# Patient Record
Sex: Female | Born: 1940
Health system: Southern US, Community
[De-identification: ages and names within clinical notes are randomized; demographics above are authoritative.]

## PROBLEM LIST (undated history)

## (undated) DIAGNOSIS — K5790 Diverticulosis of intestine, part unspecified, without perforation or abscess without bleeding: Secondary | ICD-10-CM

## (undated) DIAGNOSIS — C801 Malignant (primary) neoplasm, unspecified: Secondary | ICD-10-CM

## (undated) DIAGNOSIS — Z807 Family history of other malignant neoplasms of lymphoid, hematopoietic and related tissues: Secondary | ICD-10-CM

## (undated) DIAGNOSIS — Z801 Family history of malignant neoplasm of trachea, bronchus and lung: Secondary | ICD-10-CM

## (undated) DIAGNOSIS — K219 Gastro-esophageal reflux disease without esophagitis: Secondary | ICD-10-CM

## (undated) DIAGNOSIS — T8859XA Other complications of anesthesia, initial encounter: Secondary | ICD-10-CM

## (undated) DIAGNOSIS — T4145XA Adverse effect of unspecified anesthetic, initial encounter: Secondary | ICD-10-CM

## (undated) HISTORY — PX: TONSILLECTOMY: SUR1361

## (undated) HISTORY — DX: Family history of malignant neoplasm of trachea, bronchus and lung: Z80.1

## (undated) HISTORY — DX: Family history of other malignant neoplasms of lymphoid, hematopoietic and related tissues: Z80.7

## (undated) HISTORY — PX: PARTIAL HYSTERECTOMY: SHX80

## (undated) HISTORY — DX: Diverticulosis of intestine, part unspecified, without perforation or abscess without bleeding: K57.90

## (undated) HISTORY — PX: COLONOSCOPY: SHX174

---

## 1998-04-26 HISTORY — PX: OTHER SURGICAL HISTORY: SHX169

## 1999-04-27 HISTORY — PX: OTHER SURGICAL HISTORY: SHX169

## 2010-12-01 ENCOUNTER — Ambulatory Visit (HOSPITAL_BASED_OUTPATIENT_CLINIC_OR_DEPARTMENT_OTHER)
Admission: RE | Admit: 2010-12-01 | Discharge: 2010-12-01 | Disposition: A | Discharge status: Home / Self Care | Payer: Medicare Other | Source: Ambulatory Visit | Attending: Urology | Admitting: Urology

## 2010-12-01 DIAGNOSIS — N949 Unspecified condition associated with female genital organs and menstrual cycle: Secondary | ICD-10-CM | POA: Insufficient documentation

## 2010-12-01 DIAGNOSIS — Z79899 Other long term (current) drug therapy: Secondary | ICD-10-CM | POA: Insufficient documentation

## 2010-12-01 DIAGNOSIS — N3010 Interstitial cystitis (chronic) without hematuria: Secondary | ICD-10-CM | POA: Insufficient documentation

## 2010-12-01 DIAGNOSIS — Z01812 Encounter for preprocedural laboratory examination: Secondary | ICD-10-CM | POA: Insufficient documentation

## 2010-12-01 DIAGNOSIS — Z01810 Encounter for preprocedural cardiovascular examination: Secondary | ICD-10-CM | POA: Insufficient documentation

## 2010-12-01 LAB — POCT HEMOGLOBIN-HEMACUE: Hemoglobin: 14.7 g/dL (ref 12.0–15.0)

## 2010-12-18 NOTE — Op Note (Signed)
  Misty Hopkins, DWIGHT               ACCOUNT NO.:  [id number]  MEDICAL RECORD NO.:  [id number]  LOCATION:                                 FACILITY:  PHYSICIAN:  Martina Sinner, MD DATE OF BIRTH:  07/30/1940  DATE OF PROCEDURE:  12/01/2010 DATE OF DISCHARGE:                              OPERATIVE REPORT   PREOPERATIVE DIAGNOSIS:  Interstitial cystitis.  POSTOPERATIVE DIAGNOSIS:  Interstitial cystitis.  SURGERY:  Cystoscopy, bladder hydrodistention, bladder instillation therapy.  The patient has the above diagnosis.  She responds favorably based upon her past history to hydrodistentions.  She was prepped and draped in usual fashion.  A 21 [place] scope was utilized.  Bladder mucosa and trigone were normal.  There was no stricture, foreign body, or carcinoma.  She was hydrodistended to 500 mL for 5 minutes.  Her bladder was emptied.  When I re-inspected her bladder, she had mild glomerulation throughout her bladder.  There were more prominent 5 and 7 o'clock.  There was no bladder injury.  Bladder was emptied.  I instilled 50 cc of DMSO since she says that works better in her with a red rubber catheter.  This was done as a separate procedure.  The patient will be followed for interstitial cystitis.  Preoperative ciprofloxacin was given.          ______________________________ Martina Sinner, MD     SAM/MEDQ  D:  12/01/2010  T:  12/02/2010  Job:  [id number]  Electronically Signed by Alfredo Martinez MD on 12/18/2010 08:19:33 AM

## 2012-08-14 ENCOUNTER — Ambulatory Visit: Payer: Medicare Other | Attending: Obstetrics and Gynecology | Admitting: Physical Therapy

## 2012-08-14 DIAGNOSIS — M629 Disorder of muscle, unspecified: Secondary | ICD-10-CM | POA: Insufficient documentation

## 2012-08-14 DIAGNOSIS — M2420 Disorder of ligament, unspecified site: Secondary | ICD-10-CM | POA: Insufficient documentation

## 2012-08-14 DIAGNOSIS — M62838 Other muscle spasm: Secondary | ICD-10-CM | POA: Insufficient documentation

## 2012-08-14 DIAGNOSIS — IMO0001 Reserved for inherently not codable concepts without codable children: Secondary | ICD-10-CM | POA: Insufficient documentation

## 2012-08-28 ENCOUNTER — Ambulatory Visit: Payer: Medicare Other | Admitting: Physical Therapy

## 2012-09-04 ENCOUNTER — Ambulatory Visit: Payer: Medicare Other | Attending: Obstetrics and Gynecology | Admitting: Physical Therapy

## 2012-09-04 DIAGNOSIS — IMO0001 Reserved for inherently not codable concepts without codable children: Secondary | ICD-10-CM | POA: Insufficient documentation

## 2012-09-04 DIAGNOSIS — M2420 Disorder of ligament, unspecified site: Secondary | ICD-10-CM | POA: Insufficient documentation

## 2012-09-04 DIAGNOSIS — M629 Disorder of muscle, unspecified: Secondary | ICD-10-CM | POA: Insufficient documentation

## 2012-09-04 DIAGNOSIS — M62838 Other muscle spasm: Secondary | ICD-10-CM | POA: Insufficient documentation

## 2012-09-20 ENCOUNTER — Ambulatory Visit: Payer: Medicare Other | Admitting: Physical Therapy

## 2012-09-27 ENCOUNTER — Ambulatory Visit: Payer: Medicare Other | Attending: Obstetrics and Gynecology | Admitting: Physical Therapy

## 2012-09-27 DIAGNOSIS — M2420 Disorder of ligament, unspecified site: Secondary | ICD-10-CM | POA: Insufficient documentation

## 2012-09-27 DIAGNOSIS — IMO0002 Reserved for concepts with insufficient information to code with codable children: Secondary | ICD-10-CM | POA: Insufficient documentation

## 2012-09-27 DIAGNOSIS — M629 Disorder of muscle, unspecified: Secondary | ICD-10-CM | POA: Insufficient documentation

## 2012-09-27 DIAGNOSIS — M62838 Other muscle spasm: Secondary | ICD-10-CM | POA: Insufficient documentation

## 2012-09-27 DIAGNOSIS — IMO0001 Reserved for inherently not codable concepts without codable children: Secondary | ICD-10-CM | POA: Insufficient documentation

## 2012-10-09 ENCOUNTER — Ambulatory Visit: Payer: Medicare Other | Admitting: Physical Therapy

## 2015-04-27 HISTORY — PX: EYE SURGERY: SHX253

## 2018-02-24 ENCOUNTER — Telehealth: Payer: Self-pay | Admitting: *Deleted

## 2018-02-24 NOTE — Telephone Encounter (Signed)
Called and scheduled the patient for a new patient to see Dr. Denman George on 11/5

## 2018-02-28 ENCOUNTER — Telehealth: Payer: Self-pay | Admitting: Oncology

## 2018-02-28 ENCOUNTER — Inpatient Hospital Stay (HOSPITAL_BASED_OUTPATIENT_CLINIC_OR_DEPARTMENT_OTHER): Payer: Medicare HMO | Admitting: Gynecologic Oncology

## 2018-02-28 ENCOUNTER — Inpatient Hospital Stay: Payer: Medicare HMO | Attending: Gynecologic Oncology

## 2018-02-28 ENCOUNTER — Encounter: Payer: Self-pay | Admitting: Gynecologic Oncology

## 2018-02-28 VITALS — BP 172/92 | HR 70 | Temp 98.6°F | Resp 20 | Ht 64.0 in | Wt 149.6 lb

## 2018-02-28 DIAGNOSIS — K668 Other specified disorders of peritoneum: Secondary | ICD-10-CM

## 2018-02-28 DIAGNOSIS — C801 Malignant (primary) neoplasm, unspecified: Secondary | ICD-10-CM | POA: Diagnosis not present

## 2018-02-28 DIAGNOSIS — C800 Disseminated malignant neoplasm, unspecified: Secondary | ICD-10-CM

## 2018-02-28 DIAGNOSIS — Z01818 Encounter for other preprocedural examination: Secondary | ICD-10-CM | POA: Diagnosis not present

## 2018-02-28 DIAGNOSIS — R971 Elevated cancer antigen 125 [CA 125]: Secondary | ICD-10-CM | POA: Insufficient documentation

## 2018-02-28 DIAGNOSIS — C786 Secondary malignant neoplasm of retroperitoneum and peritoneum: Secondary | ICD-10-CM | POA: Diagnosis not present

## 2018-02-28 DIAGNOSIS — L03316 Cellulitis of umbilicus: Secondary | ICD-10-CM | POA: Insufficient documentation

## 2018-02-28 LAB — CBC WITH DIFFERENTIAL (CANCER CENTER ONLY)
ABS IMMATURE GRANULOCYTES: 0.03 10*3/uL (ref 0.00–0.07)
Basophils Absolute: 0.1 10*3/uL (ref 0.0–0.1)
Basophils Relative: 1 %
EOS PCT: 2 %
Eosinophils Absolute: 0.2 10*3/uL (ref 0.0–0.5)
HEMATOCRIT: 41.4 % (ref 36.0–46.0)
Hemoglobin: 13.1 g/dL (ref 12.0–15.0)
Immature Granulocytes: 0 %
LYMPHS ABS: 2.7 10*3/uL (ref 0.7–4.0)
LYMPHS PCT: 30 %
MCH: 28.1 pg (ref 26.0–34.0)
MCHC: 31.6 g/dL (ref 30.0–36.0)
MCV: 88.8 fL (ref 80.0–100.0)
MONO ABS: 0.9 10*3/uL (ref 0.1–1.0)
MONOS PCT: 10 %
NEUTROS ABS: 5.3 10*3/uL (ref 1.7–7.7)
Neutrophils Relative %: 57 %
Platelet Count: 208 10*3/uL (ref 150–400)
RBC: 4.66 MIL/uL (ref 3.87–5.11)
RDW: 14.1 % (ref 11.5–15.5)
WBC Count: 9.1 10*3/uL (ref 4.0–10.5)
nRBC: 0 % (ref 0.0–0.2)

## 2018-02-28 LAB — BASIC METABOLIC PANEL
Anion gap: 7 (ref 5–15)
BUN: 13 mg/dL (ref 8–23)
CO2: 25 mmol/L (ref 22–32)
CREATININE: 0.74 mg/dL (ref 0.44–1.00)
Calcium: 9.2 mg/dL (ref 8.9–10.3)
Chloride: 109 mmol/L (ref 98–111)
GFR calc Af Amer: >60 mL/min (ref >60)
Glucose, Bld: 98 mg/dL (ref 70–99)
POTASSIUM: 4 mmol/L (ref 3.5–5.1)
Sodium: 141 mmol/L (ref 135–145)

## 2018-02-28 NOTE — Patient Instructions (Signed)
Preparing for your Surgery  Plan for surgery on March 06, 2018 with Dr. Everitt Amber at [facility] will be scheduled for a diagnostic laparoscopy with omental biopsy.   Pre-operative Testing -You will receive a phone call from presurgical testing at [facility] to arrange for a pre-operative testing appointment before your surgery.  This appointment normally occurs one to two weeks before your scheduled surgery.   -Bring your insurance card, copy of an advanced directive if applicable, medication list  -At that visit, you will be asked to sign a consent for a possible blood transfusion in case a transfusion becomes necessary during surgery.  The need for a blood transfusion is rare but having consent is a necessary part of your care.     -You should not be taking blood thinners or aspirin at least ten days prior to surgery unless instructed by your surgeon.  Day Before Surgery at [place] will be asked to take in a light diet the day before surgery.  Avoid carbonated beverages.  You will be advised to have nothing to eat or drink after midnight the evening before.    Eat a light diet the day before surgery.  Examples including soups, broths, toast, yogurt, mashed potatoes.  Things to avoid include carbonated beverages (fizzy beverages), raw fruits and raw vegetables, or beans.   If your bowels are filled with gas, your surgeon will have difficulty visualizing your pelvic organs which increases your surgical risks.  Your role in recovery Your role is to become active as soon as directed by your doctor, while still giving yourself time to heal.  Rest when you feel tired. You will be asked to do the following in order to speed your recovery:  - Cough and breathe deeply. This helps toclear and expand your lungs and can prevent pneumonia. You may be given a spirometer to practice deep breathing. A staff member will show you how to use the spirometer. - Do  mild physical activity. Walking or moving your legs help your circulation and body functions return to normal. A staff member will help you when you try to walk and will provide you with simple exercises. Do not try to get up or walk alone the first time. - Actively manage your pain. Managing your pain lets you move in comfort. We will ask you to rate your pain on a scale of zero to 10. It is your responsibility to tell your doctor or nurse where and how much you hurt so your pain can be treated.  Special Considerations  -FMLA forms can be faxed to [phone] and please allow 5-7 business days for completion.   Blood Transfusion Information WHAT IS A BLOOD TRANSFUSION? A transfusion is the replacement of blood or some of its parts. Blood is made up of multiple cells which provide different functions.  Red blood cells carry oxygen and are used for blood loss replacement.  White blood cells fight against infection.  Platelets control bleeding.  Plasma helps clot blood.  Other blood products are available for specialized needs, such as hemophilia or other clotting disorders. BEFORE THE TRANSFUSION  Who gives blood for transfusions?   You may be able to donate blood to be used at a later date on yourself (autologous donation).  Relatives can be asked to donate blood. This is generally not any safer than if you have received blood from a stranger. The same precautions are taken to ensure safety when a relative's blood is  donated.  Healthy volunteers who are fully evaluated to make sure their blood is safe. This is blood bank blood. Transfusion therapy is the safest it has ever been in the practice of medicine. Before blood is taken from a donor, a complete history is taken to make sure that person has no history of diseases nor engages in risky social behavior (examples are intravenous drug use or sexual activity with multiple partners). The donor's travel history is screened to minimize  risk of transmitting infections, such as malaria. The donated blood is tested for signs of infectious diseases, such as HIV and hepatitis. The blood is then tested to be sure it is compatible with you in order to minimize the chance of a transfusion reaction. If you or a relative donates blood, this is often done in anticipation of surgery and is not appropriate for emergency situations. It takes many days to process the donated blood. RISKS AND COMPLICATIONS Although transfusion therapy is very safe and saves many lives, the main dangers of transfusion include:   Getting an infectious disease.  Developing a transfusion reaction. This is an allergic reaction to something in the blood you were given. Every precaution is taken to prevent this. The decision to have a blood transfusion has been considered carefully by your caregiver before blood is given. Blood is not given unless the benefits outweigh the risks.

## 2018-02-28 NOTE — Progress Notes (Signed)
Consult Note: Gyn-Onc  Consult was requested by Dr. Helane Rima for the evaluation of Misty Hopkins 77 y.o. female  CC:  Chief Complaint  Patient presents with  . omental mass    Assessment/Plan:  Ms. Misty Hopkins  is a 77 y.o.  year old with omental nodularity on CT scan.  I reviewed the CT scan report with the patient and her mildly elevated Ca1 25 of 103.  I discussed that the likely etiologies for the CT scan findings were either inflammation or metastatic carcinoma.  I am awaiting receiving the images to review myself to help determine the likelihood of one versus the other.  What is reassuring as she does not have adnexal masses or ascites, however potentially an early stage primary peritoneal malignancy may present in this way.  Alternatively a non-GYN malignancy may be metastatic to the omentum.  Either way I feel that a diagnostic procedure is necessary with either a diagnostic laparoscopy and biopsy of the omentum or an IR guided percutaneous biopsy.  I have scheduled her preemptively for the laparoscopic approach to take place on November 11 if however assessment of the imaging reveals that a percutaneous approach might be better for her we will change our plan.  I discussed that based on the surgical pathology findings from the biopsy we will make a determination if she needs a subsequent more radical surgery for debulking or alternatively proceeding with chemotherapy as specified by the cell type on the biopsy.  If no malignancy is found in this condition does not need to be managed with either.  I prepared her for the diagnostic laparoscopy by describing the procedure, its anticipated recovery, and surgical risks including bleeding, infection, damage to adjacent organs.  I discussed that pathology takes approximately 72 hours to be resulted and following that we will know better about her diagnosis and subsequent plan.  We will redraw tumor markers today including CEA.  We will obtain  outpatient images to review.   HPI: Ms. Misty Hopkins is a 77 year old P1 who was seen in consultation at the request of Dr. Helane Rima for omental nodularity and apparent carcinomatosis.  The patient reports a history of vague pelvic aching since July 2019.  This seemed worse with passage of gas through the colon and having a bowel movement.  She also noticed a change in her bowel habit with narrowing of stools and pain with having bowel movements.  She describes the pain is rolling gas pains.  She had a colonoscopy 2 years prior to the onset of the pain which revealed diverticulosis.  When she began experiencing these symptoms primary care physician presumptively and empirically prescribed ciprofloxacin for presumed diverticulitis.  This resulted in slight easing up with the pain but it never eliminated.  She then proceeded to have worsening pain in October 2019 and was seen in an urgent care where she was prescribed Flagyl for presumed diverticulitis.  Because of the persistence of the pain a CT scan of the abdomen and pelvis was ordered on February 20, 2018 and this was performed at [facility] [place].  At the time of this initial evaluation only the CT scan report was available.  This revealed an incidental finding of a 1.5 cm subcapsular lesion in the lateral upper pole of the left kidney which measured higher than fluid attenuation which could represent a proteinaceous renal cyst or solid renal mass.  There was no ascites.  There is no lymphadenopathy.  There were no masses in the pelvis including  ovarian or adnexal masses seen.  There was however soft tissue stranding seen with nodularity in the omental fat in the lower abdomen without evidence of ascites.  This was felt to represent either carcinomatosis or inflammatory infectious etiologies.  There was colonic diverticulosis seen without radiographic evidence of diverticulitis.  The radiologist recommended MRI of the abdomen to further work-up the renal mass.     In follow-up of the CT scan she was seen by Dr.Grewal ordered a Ca1 25 on 02/22/2018 which was elevated at 103.  She also performed a transvaginal ultrasound that same day which revealed a surgically absent uterus, and neither ovary was visualized due to diffuse bowel shadowing, but no adnexal masses or free fluid was seen.  The patient is otherwise fairly healthy 77 year old woman.  She has a history of one prior vaginal delivery complicated by pelvic organ relaxation.  She subsequently underwent a vaginal hysterectomy and then subsequently vaginal and abdominal Burch procedure and rectal prolapse surgery to repair pelvic organ prolapse.  Her past medical history is mostly significant for interstitial cystitis and diverticulitis.  Her past surgical history is significant for vaginal surgery as stated above, her only abdominal surgery was an abdominal Burch procedure.  She lives with her husband who is of good health.  Her family history significant only for lung cancer and a father who was a smoker.  Current Meds:  Outpatient Encounter Medications as of 02/28/2018  Medication Sig  . Biotin 10 MG CAPS Take 10 mg by mouth daily.  Marland Kitchen CALCIUM CARBONATE-VITAMIN D PO Take 1 tablet by mouth daily.  Marland Kitchen OVER THE COUNTER MEDICATION Take 1 tablet by mouth as needed.  . traZODone (DESYREL) 50 MG tablet Take 50 mg by mouth at bedtime as needed.    No facility-administered encounter medications on file as of 02/28/2018.     Allergy:  Allergies  Allergen Reactions  . Ivp Dye [Iodinated Diagnostic Agents] Anaphylaxis    Anaphylaxis and hives  . Propoxyphene Anaphylaxis    Hives, SOB , swollen all over  . Codeine Nausea Only    Headache and Nausea  . Ultram [Tramadol Hcl] Hives    Hives     Social Hx:   Social History   Socioeconomic History  . Marital status: Married    Spouse name: Not on file  . Number of children: Not on file  . Years of education: Not on file  . Highest education level: Not  on file  Occupational History  . Not on file  Social Needs  . Financial resource strain: Not on file  . Food insecurity:    Worry: Not on file    Inability: Not on file  . Transportation needs:    Medical: Not on file    Non-medical: Not on file  Tobacco Use  . Smoking status: Never Smoker  . Smokeless tobacco: Never Used  Substance and Sexual Activity  . Alcohol use: Never    Frequency: Never  . Drug use: Never  . Sexual activity: Not on file  Lifestyle  . Physical activity:    Days per week: Not on file    Minutes per session: Not on file  . Stress: Not on file  Relationships  . Social connections:    Talks on phone: Not on file    Gets together: Not on file    Attends religious service: Not on file    Active member of club or organization: Not on file    Attends meetings of  clubs or organizations: Not on file    Relationship status: Not on file  . Intimate partner violence:    Fear of current or ex partner: Not on file    Emotionally abused: Not on file    Physically abused: Not on file    Forced sexual activity: Not on file  Other Topics Concern  . Not on file  Social History Narrative  . Not on file    Past Surgical Hx:  Past Surgical History:  Procedure Laterality Date  . Bladder Lift  2000  . PARTIAL HYSTERECTOMY     40 years ago  . Rectum Lift  2001    Past Medical Hx: History reviewed. No pertinent past medical history.  Past Gynecological History:  No prior abnormal paps, hysterectomy age 34. Last pap normal 2019 (vaginal). No LMP recorded.  Family Hx:  Family History  Problem Relation Age of Onset  . Diabetes Mother   . Stroke Mother   . Non-Hodgkin's lymphoma Brother     Review of Systems:  Constitutional  Feels well,  ENT Normal appearing ears and nares bilaterally Skin/Breast  No rash, sores, jaundice, itching, dryness Cardiovascular  No chest pain, shortness of breath, or edema  Pulmonary  No cough or wheeze.  Gastro  Intestinal  + lower abdominal pain, change in bowel habit (narrowing of stools). Genito Urinary  No frequency, urgency, dysuria, Musculo Skeletal  No myalgia, arthralgia, joint swelling or pain  Neurologic  No weakness, numbness, change in gait,  Psychology  No depression, anxiety, insomnia.   Vitals:  Blood pressure (!) 172/92, pulse 70, temperature 98.6 F (37 C), temperature source Oral, resp. rate 20, height 5\' 4"  (1.626 m), weight 149 lb 9.6 oz (67.9 kg), SpO2 99 %.  Physical Exam: WD in NAD Neck  Supple NROM, without any enlargements.  Lymph Node Survey No cervical supraclavicular or inguinal adenopathy Cardiovascular  Pulse normal rate, regularity and rhythm. S1 and S2 normal.  Lungs  Clear to auscultation bilateraly, without wheezes/crackles/rhonchi. Good air movement.  Skin  No rash/lesions/breakdown  Psychiatry  Alert and oriented to person, place, and time  Abdomen  Normoactive bowel sounds, abdomen soft, non-tender and nonobese without evidence of hernia. No masses appreciated. Not distended with ascites. Back No CVA tenderness Genito Urinary  Vulva/vagina: Normal external female genitalia.  No lesions. No discharge or bleeding.  Bladder/urethra:  No lesions or masses, well supported bladder  Vagina: normal  Cervix and uterus surgical absent  Adnexa: no palpble masses. Rectal  Good tone, no masses no cul de sac nodularity.  Extremities  No bilateral cyanosis, clubbing or edema.   Thereasa Solo, MD  02/28/2018, 5:05 PM

## 2018-02-28 NOTE — Telephone Encounter (Signed)
Called Tedra Coupe in [place] radiology to see if CT images done at [facility] can be power shared to [facility].  She said she will call them and if they can power share, the images will be available in PACS.  If not, she will request a CD of the images.

## 2018-02-28 NOTE — H&P (View-Only) (Signed)
Consult Note: Gyn-Onc  Consult was requested by Dr. Helane Rima for the evaluation of Misty Hopkins 77 y.o. female  CC:  Chief Complaint  Patient presents with  . omental mass    Assessment/Plan:  Ms. Misty Hopkins  is a 77 y.o.  year old with omental nodularity on CT scan.  I reviewed the CT scan report with the patient and her mildly elevated Ca1 25 of 103.  I discussed that the likely etiologies for the CT scan findings were either inflammation or metastatic carcinoma.  I am awaiting receiving the images to review myself to help determine the likelihood of one versus the other.  What is reassuring as she does not have adnexal masses or ascites, however potentially an early stage primary peritoneal malignancy may present in this way.  Alternatively a non-GYN malignancy may be metastatic to the omentum.  Either way I feel that a diagnostic procedure is necessary with either a diagnostic laparoscopy and biopsy of the omentum or an IR guided percutaneous biopsy.  I have scheduled her preemptively for the laparoscopic approach to take place on November 11 if however assessment of the imaging reveals that a percutaneous approach might be better for her we will change our plan.  I discussed that based on the surgical pathology findings from the biopsy we will make a determination if she needs a subsequent more radical surgery for debulking or alternatively proceeding with chemotherapy as specified by the cell type on the biopsy.  If no malignancy is found in this condition does not need to be managed with either.  I prepared her for the diagnostic laparoscopy by describing the procedure, its anticipated recovery, and surgical risks including bleeding, infection, damage to adjacent organs.  I discussed that pathology takes approximately 72 hours to be resulted and following that we will know better about her diagnosis and subsequent plan.  We will redraw tumor markers today including CEA.  We will obtain  outpatient images to review.   HPI: Ms. Misty Hopkins is a 77 year old P1 who was seen in consultation at the request of Dr. Helane Rima for omental nodularity and apparent carcinomatosis.  The patient reports a history of vague pelvic aching since July 2019.  This seemed worse with passage of gas through the colon and having a bowel movement.  She also noticed a change in her bowel habit with narrowing of stools and pain with having bowel movements.  She describes the pain is rolling gas pains.  She had a colonoscopy 2 years prior to the onset of the pain which revealed diverticulosis.  When she began experiencing these symptoms primary care physician presumptively and empirically prescribed ciprofloxacin for presumed diverticulitis.  This resulted in slight easing up with the pain but it never eliminated.  She then proceeded to have worsening pain in October 2019 and was seen in an urgent care where she was prescribed Flagyl for presumed diverticulitis.  Because of the persistence of the pain a CT scan of the abdomen and pelvis was ordered on February 20, 2018 and this was performed at [facility].  At the time of this initial evaluation only the CT scan report was available.  This revealed an incidental finding of a 1.5 cm subcapsular lesion in the lateral upper pole of the left kidney which measured higher than fluid attenuation which could represent a proteinaceous renal cyst or solid renal mass.  There was no ascites.  There is no lymphadenopathy.  There were no masses in the pelvis including  ovarian or adnexal masses seen.  There was however soft tissue stranding seen with nodularity in the omental fat in the lower abdomen without evidence of ascites.  This was felt to represent either carcinomatosis or inflammatory infectious etiologies.  There was colonic diverticulosis seen without radiographic evidence of diverticulitis.  The radiologist recommended MRI of the abdomen to further work-up the renal mass.     In follow-up of the CT scan she was seen by Dr.Grewal ordered a Ca1 25 on 02/22/2018 which was elevated at 103.  She also performed a transvaginal ultrasound that same day which revealed a surgically absent uterus, and neither ovary was visualized due to diffuse bowel shadowing, but no adnexal masses or free fluid was seen.  The patient is otherwise fairly healthy 77 year old woman.  She has a history of one prior vaginal delivery complicated by pelvic organ relaxation.  She subsequently underwent a vaginal hysterectomy and then subsequently vaginal and abdominal Burch procedure and rectal prolapse surgery to repair pelvic organ prolapse.  Her past medical history is mostly significant for interstitial cystitis and diverticulitis.  Her past surgical history is significant for vaginal surgery as stated above, her only abdominal surgery was an abdominal Burch procedure.  She lives with her husband who is of good health.  Her family history significant only for lung cancer and a father who was a smoker.  Current Meds:  Outpatient Encounter Medications as of 02/28/2018  Medication Sig  . Biotin 10 MG CAPS Take 10 mg by mouth daily.  Marland Kitchen CALCIUM CARBONATE-VITAMIN D PO Take 1 tablet by mouth daily.  Marland Kitchen OVER THE COUNTER MEDICATION Take 1 tablet by mouth as needed.  . traZODone (DESYREL) 50 MG tablet Take 50 mg by mouth at bedtime as needed.    No facility-administered encounter medications on file as of 02/28/2018.     Allergy:  Allergies  Allergen Reactions  . Ivp Dye [Iodinated Diagnostic Agents] Anaphylaxis    Anaphylaxis and hives  . Propoxyphene Anaphylaxis    Hives, SOB , swollen all over  . Codeine Nausea Only    Headache and Nausea  . Ultram [Tramadol Hcl] Hives    Hives     Social Hx:   Social History   Socioeconomic History  . Marital status: Married    Spouse name: Not on file  . Number of children: Not on file  . Years of education: Not on file  . Highest education level: Not  on file  Occupational History  . Not on file  Social Needs  . Financial resource strain: Not on file  . Food insecurity:    Worry: Not on file    Inability: Not on file  . Transportation needs:    Medical: Not on file    Non-medical: Not on file  Tobacco Use  . Smoking status: Never Smoker  . Smokeless tobacco: Never Used  Substance and Sexual Activity  . Alcohol use: Never    Frequency: Never  . Drug use: Never  . Sexual activity: Not on file  Lifestyle  . Physical activity:    Days per week: Not on file    Minutes per session: Not on file  . Stress: Not on file  Relationships  . Social connections:    Talks on phone: Not on file    Gets together: Not on file    Attends religious service: Not on file    Active member of club or organization: Not on file    Attends meetings of  clubs or organizations: Not on file    Relationship status: Not on file  . Intimate partner violence:    Fear of current or ex partner: Not on file    Emotionally abused: Not on file    Physically abused: Not on file    Forced sexual activity: Not on file  Other Topics Concern  . Not on file  Social History Narrative  . Not on file    Past Surgical Hx:  Past Surgical History:  Procedure Laterality Date  . Bladder Lift  2000  . PARTIAL HYSTERECTOMY     40 years ago  . Rectum Lift  2001    Past Medical Hx: History reviewed. No pertinent past medical history.  Past Gynecological History:  No prior abnormal paps, hysterectomy age 37. Last pap normal 2019 (vaginal). No LMP recorded.  Family Hx:  Family History  Problem Relation Age of Onset  . Diabetes Mother   . Stroke Mother   . Non-Hodgkin's lymphoma Brother     Review of Systems:  Constitutional  Feels well,  ENT Normal appearing ears and nares bilaterally Skin/Breast  No rash, sores, jaundice, itching, dryness Cardiovascular  No chest pain, shortness of breath, or edema  Pulmonary  No cough or wheeze.  Gastro  Intestinal  + lower abdominal pain, change in bowel habit (narrowing of stools). Genito Urinary  No frequency, urgency, dysuria, Musculo Skeletal  No myalgia, arthralgia, joint swelling or pain  Neurologic  No weakness, numbness, change in gait,  Psychology  No depression, anxiety, insomnia.   Vitals:  Blood pressure (!) 172/92, pulse 70, temperature 98.6 F (37 C), temperature source Oral, resp. rate 20, height 5\' 4"  (1.626 m), weight 149 lb 9.6 oz (67.9 kg), SpO2 99 %.  Physical Exam: WD in NAD Neck  Supple NROM, without any enlargements.  Lymph Node Survey No cervical supraclavicular or inguinal adenopathy Cardiovascular  Pulse normal rate, regularity and rhythm. S1 and S2 normal.  Lungs  Clear to auscultation bilateraly, without wheezes/crackles/rhonchi. Good air movement.  Skin  No rash/lesions/breakdown  Psychiatry  Alert and oriented to person, place, and time  Abdomen  Normoactive bowel sounds, abdomen soft, non-tender and nonobese without evidence of hernia. No masses appreciated. Not distended with ascites. Back No CVA tenderness Genito Urinary  Vulva/vagina: Normal external female genitalia.  No lesions. No discharge or bleeding.  Bladder/urethra:  No lesions or masses, well supported bladder  Vagina: normal  Cervix and uterus surgical absent  Adnexa: no palpble masses. Rectal  Good tone, no masses no cul de sac nodularity.  Extremities  No bilateral cyanosis, clubbing or edema.   Thereasa Solo, MD  02/28/2018, 5:05 PM

## 2018-02-28 NOTE — H&P (View-Only) (Signed)
Consult Note: Gyn-Onc  Consult was requested by Dr. Helane Rima for the evaluation of Misty Hopkins 77 y.o. female  CC:  Chief Complaint  Patient presents with  . omental mass    Assessment/Plan:  Ms. Misty Hopkins  is a 77 y.o.  year old with omental nodularity on CT scan.  I reviewed the CT scan report with the patient and her mildly elevated Ca1 25 of 103.  I discussed that the likely etiologies for the CT scan findings were either inflammation or metastatic carcinoma.  I am awaiting receiving the images to review myself to help determine the likelihood of one versus the other.  What is reassuring as she does not have adnexal masses or ascites, however potentially an early stage primary peritoneal malignancy may present in this way.  Alternatively a non-GYN malignancy may be metastatic to the omentum.  Either way I feel that a diagnostic procedure is necessary with either a diagnostic laparoscopy and biopsy of the omentum or an IR guided percutaneous biopsy.  I have scheduled her preemptively for the laparoscopic approach to take place on November 11 if however assessment of the imaging reveals that a percutaneous approach might be better for her we will change our plan.  I discussed that based on the surgical pathology findings from the biopsy we will make a determination if she needs a subsequent more radical surgery for debulking or alternatively proceeding with chemotherapy as specified by the cell type on the biopsy.  If no malignancy is found in this condition does not need to be managed with either.  I prepared her for the diagnostic laparoscopy by describing the procedure, its anticipated recovery, and surgical risks including bleeding, infection, damage to adjacent organs.  I discussed that pathology takes approximately 72 hours to be resulted and following that we will know better about her diagnosis and subsequent plan.  We will redraw tumor markers today including CEA.  We will obtain  outpatient images to review.   HPI: Ms. Misty Hopkins is a 77 year old P1 who was seen in consultation at the request of Dr. Helane Rima for omental nodularity and apparent carcinomatosis.  The patient reports a history of vague pelvic aching since July 2019.  This seemed worse with passage of gas through the colon and having a bowel movement.  She also noticed a change in her bowel habit with narrowing of stools and pain with having bowel movements.  She describes the pain is rolling gas pains.  She had a colonoscopy 2 years prior to the onset of the pain which revealed diverticulosis.  When she began experiencing these symptoms primary care physician presumptively and empirically prescribed ciprofloxacin for presumed diverticulitis.  This resulted in slight easing up with the pain but it never eliminated.  She then proceeded to have worsening pain in October 2019 and was seen in an urgent care where she was prescribed Flagyl for presumed diverticulitis.  Because of the persistence of the pain a CT scan of the abdomen and pelvis was ordered on February 20, 2018 and this was performed at [facility].  At the time of this initial evaluation only the CT scan report was available.  This revealed an incidental finding of a 1.5 cm subcapsular lesion in the lateral upper pole of the left kidney which measured higher than fluid attenuation which could represent a proteinaceous renal cyst or solid renal mass.  There was no ascites.  There is no lymphadenopathy.  There were no masses in the pelvis including  ovarian or adnexal masses seen.  There was however soft tissue stranding seen with nodularity in the omental fat in the lower abdomen without evidence of ascites.  This was felt to represent either carcinomatosis or inflammatory infectious etiologies.  There was colonic diverticulosis seen without radiographic evidence of diverticulitis.  The radiologist recommended MRI of the abdomen to further work-up the renal mass.     In follow-up of the CT scan she was seen by Dr.Grewal ordered a Ca1 25 on 02/22/2018 which was elevated at 103.  She also performed a transvaginal ultrasound that same day which revealed a surgically absent uterus, and neither ovary was visualized due to diffuse bowel shadowing, but no adnexal masses or free fluid was seen.  The patient is otherwise fairly healthy 77 year old woman.  She has a history of one prior vaginal delivery complicated by pelvic organ relaxation.  She subsequently underwent a vaginal hysterectomy and then subsequently vaginal and abdominal Burch procedure and rectal prolapse surgery to repair pelvic organ prolapse.  Her past medical history is mostly significant for interstitial cystitis and diverticulitis.  Her past surgical history is significant for vaginal surgery as stated above, her only abdominal surgery was an abdominal Burch procedure.  She lives with her husband who is of good health.  Her family history significant only for lung cancer and a father who was a smoker.  Current Meds:  Outpatient Encounter Medications as of 02/28/2018  Medication Sig  . Biotin 10 MG CAPS Take 10 mg by mouth daily.  Marland Kitchen CALCIUM CARBONATE-VITAMIN D PO Take 1 tablet by mouth daily.  Marland Kitchen OVER THE COUNTER MEDICATION Take 1 tablet by mouth as needed.  . traZODone (DESYREL) 50 MG tablet Take 50 mg by mouth at bedtime as needed.    No facility-administered encounter medications on file as of 02/28/2018.     Allergy:  Allergies  Allergen Reactions  . Ivp Dye [Iodinated Diagnostic Agents] Anaphylaxis    Anaphylaxis and hives  . Propoxyphene Anaphylaxis    Hives, SOB , swollen all over  . Codeine Nausea Only    Headache and Nausea  . Ultram [Tramadol Hcl] Hives    Hives     Social Hx:   Social History   Socioeconomic History  . Marital status: Married    Spouse name: Not on file  . Number of children: Not on file  . Years of education: Not on file  . Highest education level: Not  on file  Occupational History  . Not on file  Social Needs  . Financial resource strain: Not on file  . Food insecurity:    Worry: Not on file    Inability: Not on file  . Transportation needs:    Medical: Not on file    Non-medical: Not on file  Tobacco Use  . Smoking status: Never Smoker  . Smokeless tobacco: Never Used  Substance and Sexual Activity  . Alcohol use: Never    Frequency: Never  . Drug use: Never  . Sexual activity: Not on file  Lifestyle  . Physical activity:    Days per week: Not on file    Minutes per session: Not on file  . Stress: Not on file  Relationships  . Social connections:    Talks on phone: Not on file    Gets together: Not on file    Attends religious service: Not on file    Active member of club or organization: Not on file    Attends meetings of  clubs or organizations: Not on file    Relationship status: Not on file  . Intimate partner violence:    Fear of current or ex partner: Not on file    Emotionally abused: Not on file    Physically abused: Not on file    Forced sexual activity: Not on file  Other Topics Concern  . Not on file  Social History Narrative  . Not on file    Past Surgical Hx:  Past Surgical History:  Procedure Laterality Date  . Bladder Lift  2000  . PARTIAL HYSTERECTOMY     40 years ago  . Rectum Lift  2001    Past Medical Hx: History reviewed. No pertinent past medical history.  Past Gynecological History:  No prior abnormal paps, hysterectomy age 33. Last pap normal 2019 (vaginal). No LMP recorded.  Family Hx:  Family History  Problem Relation Age of Onset  . Diabetes Mother   . Stroke Mother   . Non-Hodgkin's lymphoma Brother     Review of Systems:  Constitutional  Feels well,  ENT Normal appearing ears and nares bilaterally Skin/Breast  No rash, sores, jaundice, itching, dryness Cardiovascular  No chest pain, shortness of breath, or edema  Pulmonary  No cough or wheeze.  Gastro  Intestinal  + lower abdominal pain, change in bowel habit (narrowing of stools). Genito Urinary  No frequency, urgency, dysuria, Musculo Skeletal  No myalgia, arthralgia, joint swelling or pain  Neurologic  No weakness, numbness, change in gait,  Psychology  No depression, anxiety, insomnia.   Vitals:  Blood pressure (!) 172/92, pulse 70, temperature 98.6 F (37 C), temperature source Oral, resp. rate 20, height 5\' 4"  (1.626 m), weight 149 lb 9.6 oz (67.9 kg), SpO2 99 %.  Physical Exam: WD in NAD Neck  Supple NROM, without any enlargements.  Lymph Node Survey No cervical supraclavicular or inguinal adenopathy Cardiovascular  Pulse normal rate, regularity and rhythm. S1 and S2 normal.  Lungs  Clear to auscultation bilateraly, without wheezes/crackles/rhonchi. Good air movement.  Skin  No rash/lesions/breakdown  Psychiatry  Alert and oriented to person, place, and time  Abdomen  Normoactive bowel sounds, abdomen soft, non-tender and nonobese without evidence of hernia. No masses appreciated. Not distended with ascites. Back No CVA tenderness Genito Urinary  Vulva/vagina: Normal external female genitalia.  No lesions. No discharge or bleeding.  Bladder/urethra:  No lesions or masses, well supported bladder  Vagina: normal  Cervix and uterus surgical absent  Adnexa: no palpble masses. Rectal  Good tone, no masses no cul de sac nodularity.  Extremities  No bilateral cyanosis, clubbing or edema.   Thereasa Solo, MD  02/28/2018, 5:05 PM

## 2018-03-01 ENCOUNTER — Encounter: Payer: Self-pay | Admitting: Gynecologic Oncology

## 2018-03-01 DIAGNOSIS — K668 Other specified disorders of peritoneum: Secondary | ICD-10-CM | POA: Insufficient documentation

## 2018-03-01 LAB — CEA (IN HOUSE-CHCC): CEA (CHCC-In House): 1.98 ng/mL (ref 0.00–5.00)

## 2018-03-01 LAB — CA 125: Cancer Antigen (CA) 125: 66 U/mL — ABNORMAL HIGH (ref 0.0–38.1)

## 2018-03-01 NOTE — Patient Instructions (Addendum)
Sadee Osland Edick  03/01/2018   Your procedure is scheduled on: 03-06-18     Report to [facility] Main  Entrance    Report to Admitting at 10:50  AM    Call this number if you have problems the morning of surgery [phone]   Eat a light diet the day before surgery.  Examples including soups, broths, toast, yogurt, mashed potatoes.  Things to avoid include carbonated beverages (fizzy beverages), raw fruits and raw vegetables, or beans.   If your bowels are filled with gas, your surgeon will have difficulty visualizing your pelvic organs which increases your surgical risks.       Remember:You may have a Clear Liquid Diet from Midnight until 7:17 AM. After 7:17 AM, nothing until after surgery.    CLEAR LIQUID DIET   Foods Allowed                                                                     Foods Excluded  Coffee and tea, regular and decaf                             liquids that you cannot  Plain Jell-O in any flavor                                             see through such as: Fruit ices (not with fruit pulp)                                     milk, soups, orange juice  Iced Popsicles                                    All solid food Carbonated beverages, regular and diet                                    Cranberry, grape and apple juices Sports drinks like Gatorade Lightly seasoned clear broth or consume(fat free) Sugar, honey syrup  Sample Menu Breakfast                                Lunch                                     Supper Cranberry juice                    Beef broth                            Chicken broth Jell-O  Grape juice                           Apple juice Coffee or tea                        Jell-O                                      Popsicle                                                Coffee or tea                        Coffee or  tea  _____________________________________________________________________     BRUSH YOUR TEETH MORNING OF SURGERY AND RINSE YOUR MOUTH OUT, NO CHEWING GUM CANDY OR MINTS.     Take these medicines the morning of surgery with A SIP OF WATER: None                               You may not have any metal on your body including hair pins and              piercings  Do not wear jewelry, make-up, lotions, powders or perfumes, deodorant             Do not wear nail polish.  Do not shave  48 hours prior to surgery.                Do not bring valuables to the hospital. [place].  Contacts, dentures or bridgework may not be worn into surgery.    Patients discharged the day of surgery will not be allowed to drive home.  Name and phone number of your driver: Perlita Forbush [phone]  Special Instructions: Cough and Deep Breath             Please read over the following fact sheets you were given: _____________________________________________________________________             [facility] - Preparing for Surgery Before surgery, you can play an important role.  Because skin is not sterile, your skin needs to be as free of germs as possible.  You can reduce the number of germs on your skin by washing with CHG (chlorahexidine gluconate) soap before surgery.  CHG is an antiseptic cleaner which kills germs and bonds with the skin to continue killing germs even after washing. Please DO NOT use if you have an allergy to CHG or antibacterial soaps.  If your skin becomes reddened/irritated stop using the CHG and inform your nurse when you arrive at Short Stay. Do not shave (including legs and underarms) for at least 48 hours prior to the first CHG shower.  You may shave your face/neck. Please follow these instructions carefully:  1.  Shower with CHG Soap the night before surgery and the  morning of Surgery.  2.  If you choose to wash your hair, wash  your hair first as usual with  your  normal  shampoo.  3.  After you shampoo, rinse your hair and body thoroughly to remove the  shampoo.                           4.  Use CHG as you would any other liquid soap.  You can apply chg directly  to the skin and wash                       Gently with a scrungie or clean washcloth.  5.  Apply the CHG Soap to your body ONLY FROM THE NECK DOWN.   Do not use on face/ open                           Wound or open sores. Avoid contact with eyes, ears mouth and genitals (private parts).                       Wash face,  Genitals (private parts) with your normal soap.             6.  Wash thoroughly, paying special attention to the area where your surgery  will be performed.  7.  Thoroughly rinse your body with warm water from the neck down.  8.  DO NOT shower/wash with your normal soap after using and rinsing off  the CHG Soap.                9.  Pat yourself dry with a clean towel.            10.  Wear clean pajamas.            11.  Place clean sheets on your bed the night of your first shower and do not  sleep with pets. Day of Surgery : Do not apply any lotions/deodorants the morning of surgery.  Please wear clean clothes to the hospital/surgery center.  FAILURE TO FOLLOW THESE INSTRUCTIONS MAY RESULT IN THE CANCELLATION OF YOUR SURGERY PATIENT SIGNATURE_________________________________  NURSE SIGNATURE__________________________________  ________________________________________________________________________   Adam Phenix  An incentive spirometer is a tool that can help keep your lungs clear and active. This tool measures how well you are filling your lungs with each breath. Taking long deep breaths may help reverse or decrease the chance of developing breathing (pulmonary) problems (especially infection) following:  A long period of time when you are unable to move or be active. BEFORE THE PROCEDURE   If the spirometer includes an  indicator to show your best effort, your nurse or respiratory therapist will set it to a desired goal.  If possible, sit up straight or lean slightly forward. Try not to slouch.  Hold the incentive spirometer in an upright position. INSTRUCTIONS FOR USE  1. Sit on the edge of your bed if possible, or sit up as far as you can in bed or on a chair. 2. Hold the incentive spirometer in an upright position. 3. Breathe out normally. 4. Place the mouthpiece in your mouth and seal your lips tightly around it. 5. Breathe in slowly and as deeply as possible, raising the piston or the ball toward the top of the column. 6. Hold your breath for 3-5 seconds or for as long as possible. Allow the piston or ball to fall to the bottom of the column. 7. Remove the mouthpiece from your mouth  and breathe out normally. 8. Rest for a few seconds and repeat Steps 1 through 7 at least 10 times every 1-2 hours when you are awake. Take your time and take a few normal breaths between deep breaths. 9. The spirometer may include an indicator to show your best effort. Use the indicator as a goal to work toward during each repetition. 10. After each set of 10 deep breaths, practice coughing to be sure your lungs are clear. If you have an incision (the cut made at the time of surgery), support your incision when coughing by placing a pillow or rolled up towels firmly against it. Once you are able to get out of bed, walk around indoors and cough well. You may stop using the incentive spirometer when instructed by your caregiver.  RISKS AND COMPLICATIONS  Take your time so you do not get dizzy or light-headed.  If you are in pain, you may need to take or ask for pain medication before doing incentive spirometry. It is harder to take a deep breath if you are having pain. AFTER USE  Rest and breathe slowly and easily.  It can be helpful to keep track of a log of your progress. Your caregiver can provide you with a simple table  to help with this. If you are using the spirometer at home, follow these instructions: [place] IF:   You are having difficultly using the spirometer.  You have trouble using the spirometer as often as instructed.  Your pain medication is not giving enough relief while using the spirometer.  You develop fever of 100.5 F (38.1 C) or higher. SEEK IMMEDIATE MEDICAL CARE IF:   You cough up bloody sputum that had not been present before.  You develop fever of 102 F (38.9 C) or greater.  You develop worsening pain at or near the incision site. MAKE SURE YOU:   Understand these instructions.  Will watch your condition.  Will get help right away if you are not doing well or get worse. Document Released: 08/23/2006 Document Revised: 07/05/2011 Document Reviewed: 10/24/2006 Exit[employer] Patient Information 2014 Exit[employer], [place].   ________________________________________________________________________  WHAT IS A BLOOD TRANSFUSION? Blood Transfusion Information  A transfusion is the replacement of blood or some of its parts. Blood is made up of multiple cells which provide different functions.  Red blood cells carry oxygen and are used for blood loss replacement.  White blood cells fight against infection.  Platelets control bleeding.  Plasma helps clot blood.  Other blood products are available for specialized needs, such as hemophilia or other clotting disorders. BEFORE THE TRANSFUSION  Who gives blood for transfusions?   Healthy volunteers who are fully evaluated to make sure their blood is safe. This is blood bank blood. Transfusion therapy is the safest it has ever been in the practice of medicine. Before blood is taken from a donor, a complete history is taken to make sure that person has no history of diseases nor engages in risky social behavior (examples are intravenous drug use or sexual activity with multiple partners). The donor's travel history is screened  to minimize risk of transmitting infections, such as malaria. The donated blood is tested for signs of infectious diseases, such as HIV and hepatitis. The blood is then tested to be sure it is compatible with you in order to minimize the chance of a transfusion reaction. If you or a relative donates blood, this is often done in anticipation of surgery and is not appropriate  for emergency situations. It takes many days to process the donated blood. RISKS AND COMPLICATIONS Although transfusion therapy is very safe and saves many lives, the main dangers of transfusion include:   Getting an infectious disease.  Developing a transfusion reaction. This is an allergic reaction to something in the blood you were given. Every precaution is taken to prevent this. The decision to have a blood transfusion has been considered carefully by your caregiver before blood is given. Blood is not given unless the benefits outweigh the risks. AFTER THE TRANSFUSION  Right after receiving a blood transfusion, you will usually feel much better and more energetic. This is especially true if your red blood cells have gotten low (anemic). The transfusion raises the level of the red blood cells which carry oxygen, and this usually causes an energy increase.  The nurse administering the transfusion will monitor you carefully for complications. HOME CARE INSTRUCTIONS  No special instructions are needed after a transfusion. You may find your energy is better. Speak with your caregiver about any limitations on activity for underlying diseases you may have. SEEK MEDICAL CARE IF:   Your condition is not improving after your transfusion.  You develop redness or irritation at the intravenous (IV) site. SEEK IMMEDIATE MEDICAL CARE IF:  Any of the following symptoms occur over the next 12 hours:  Shaking chills.  You have a temperature by mouth above 102 F (38.9 C), not controlled by medicine.  Chest, back, or muscle  pain.  People around you feel you are not acting correctly or are confused.  Shortness of breath or difficulty breathing.  Dizziness and fainting.  You get a rash or develop hives.  You have a decrease in urine output.  Your urine turns a dark color or changes to pink, red, or brown. Any of the following symptoms occur over the next 10 days:  You have a temperature by mouth above 102 F (38.9 C), not controlled by medicine.  Shortness of breath.  Weakness after normal activity.  The white part of the eye turns yellow (jaundice).  You have a decrease in the amount of urine or are urinating less often.  Your urine turns a dark color or changes to pink, red, or brown. Document Released: 04/09/2000 Document Revised: 07/05/2011 Document Reviewed: 11/27/2007 [facility] Patient Information 2014 [place], [place].  _______________________________________________________________________

## 2018-03-02 ENCOUNTER — Telehealth: Payer: Self-pay

## 2018-03-02 ENCOUNTER — Telehealth: Payer: Self-pay | Admitting: Oncology

## 2018-03-02 NOTE — Telephone Encounter (Signed)
[facility] and requested another CD to be overnighted via Fedex.

## 2018-03-02 NOTE — Telephone Encounter (Signed)
Spoke with Misty Hopkins and told him that the CEA was normal at 1.98. The CA-125 was 48 which is lower then the value from referring physician but still elevated so Dr. Denman George will proceed with surgery on Monday 03-06-18. Husband verbalized understanding.

## 2018-03-03 ENCOUNTER — Other Ambulatory Visit: Payer: Self-pay

## 2018-03-03 ENCOUNTER — Encounter (HOSPITAL_COMMUNITY): Payer: Self-pay | Admitting: *Deleted

## 2018-03-03 ENCOUNTER — Other Ambulatory Visit: Payer: Self-pay | Admitting: Oncology

## 2018-03-03 ENCOUNTER — Encounter (HOSPITAL_COMMUNITY)
Admission: RE | Admit: 2018-03-03 | Discharge: 2018-03-03 | Disposition: A | Discharge status: Home / Self Care | Payer: Medicare HMO | Source: Ambulatory Visit | Attending: Gynecologic Oncology | Admitting: Gynecologic Oncology

## 2018-03-03 DIAGNOSIS — Z90711 Acquired absence of uterus with remaining cervical stump: Secondary | ICD-10-CM | POA: Diagnosis not present

## 2018-03-03 DIAGNOSIS — Z01812 Encounter for preprocedural laboratory examination: Secondary | ICD-10-CM | POA: Insufficient documentation

## 2018-03-03 DIAGNOSIS — I10 Essential (primary) hypertension: Secondary | ICD-10-CM | POA: Diagnosis not present

## 2018-03-03 DIAGNOSIS — C801 Malignant (primary) neoplasm, unspecified: Secondary | ICD-10-CM | POA: Diagnosis not present

## 2018-03-03 DIAGNOSIS — K668 Other specified disorders of peritoneum: Secondary | ICD-10-CM | POA: Diagnosis present

## 2018-03-03 DIAGNOSIS — C786 Secondary malignant neoplasm of retroperitoneum and peritoneum: Secondary | ICD-10-CM | POA: Diagnosis not present

## 2018-03-03 DIAGNOSIS — Z79899 Other long term (current) drug therapy: Secondary | ICD-10-CM | POA: Diagnosis not present

## 2018-03-03 HISTORY — DX: Adverse effect of unspecified anesthetic, initial encounter: T41.45XA

## 2018-03-03 HISTORY — DX: Gastro-esophageal reflux disease without esophagitis: K21.9

## 2018-03-03 HISTORY — DX: Other complications of anesthesia, initial encounter: T88.59XA

## 2018-03-03 LAB — URINALYSIS, ROUTINE W REFLEX MICROSCOPIC
Bilirubin Urine: NEGATIVE
Glucose, UA: NEGATIVE mg/dL
Hgb urine dipstick: NEGATIVE
Ketones, ur: NEGATIVE mg/dL
Nitrite: NEGATIVE
PROTEIN: NEGATIVE mg/dL
Specific Gravity, Urine: 1.013 (ref 1.005–1.030)
pH: 6 (ref 5.0–8.0)

## 2018-03-03 LAB — COMPREHENSIVE METABOLIC PANEL
ALT: 19 U/L (ref 0–44)
ANION GAP: 9 (ref 5–15)
AST: 24 U/L (ref 15–41)
Albumin: 4.3 g/dL (ref 3.5–5.0)
Alkaline Phosphatase: 45 U/L (ref 38–126)
BUN: 15 mg/dL (ref 8–23)
CHLORIDE: 107 mmol/L (ref 98–111)
CO2: 26 mmol/L (ref 22–32)
CREATININE: 0.77 mg/dL (ref 0.44–1.00)
Calcium: 8.9 mg/dL (ref 8.9–10.3)
Glucose, Bld: 141 mg/dL — ABNORMAL HIGH (ref 70–99)
Potassium: 3.9 mmol/L (ref 3.5–5.1)
SODIUM: 142 mmol/L (ref 135–145)
Total Bilirubin: 0.5 mg/dL (ref 0.3–1.2)
Total Protein: 7.1 g/dL (ref 6.5–8.1)

## 2018-03-03 LAB — CBC
HCT: 43.9 % (ref 36.0–46.0)
Hemoglobin: 13.7 g/dL (ref 12.0–15.0)
MCH: 28.2 pg (ref 26.0–34.0)
MCHC: 31.2 g/dL (ref 30.0–36.0)
MCV: 90.3 fL (ref 80.0–100.0)
PLATELETS: 226 10*3/uL (ref 150–400)
RBC: 4.86 MIL/uL (ref 3.87–5.11)
RDW: 13.9 % (ref 11.5–15.5)
WBC: 8.1 10*3/uL (ref 4.0–10.5)
nRBC: 0 % (ref 0.0–0.2)

## 2018-03-03 LAB — ABO/RH: ABO/RH(D): A POS

## 2018-03-03 NOTE — Progress Notes (Signed)
03-03-18 UA result routed to Dr. Denman George office for review.

## 2018-03-03 NOTE — Telephone Encounter (Signed)
Received call from Sherri at [facility].  The CD was sent out yesterday via [employer] and the tracking number is [id number] and is expected to be delivered today.

## 2018-03-05 LAB — URINE CULTURE

## 2018-03-06 ENCOUNTER — Ambulatory Visit (HOSPITAL_BASED_OUTPATIENT_CLINIC_OR_DEPARTMENT_OTHER): Payer: Medicare HMO | Admitting: Anesthesiology

## 2018-03-06 ENCOUNTER — Encounter (HOSPITAL_COMMUNITY): Payer: Self-pay | Admitting: *Deleted

## 2018-03-06 ENCOUNTER — Ambulatory Visit (HOSPITAL_BASED_OUTPATIENT_CLINIC_OR_DEPARTMENT_OTHER)
Admission: RE | Admit: 2018-03-06 | Discharge: 2018-03-06 | Disposition: A | Discharge status: Home / Self Care | Payer: Medicare HMO | Source: Ambulatory Visit | Attending: Gynecologic Oncology | Admitting: Gynecologic Oncology

## 2018-03-06 ENCOUNTER — Encounter (HOSPITAL_COMMUNITY)
Admission: RE | Disposition: A | Discharge status: Home / Self Care | Payer: Self-pay | Source: Ambulatory Visit | Attending: Gynecologic Oncology

## 2018-03-06 DIAGNOSIS — I10 Essential (primary) hypertension: Secondary | ICD-10-CM | POA: Insufficient documentation

## 2018-03-06 DIAGNOSIS — C482 Malignant neoplasm of peritoneum, unspecified: Secondary | ICD-10-CM | POA: Diagnosis not present

## 2018-03-06 DIAGNOSIS — Z90711 Acquired absence of uterus with remaining cervical stump: Secondary | ICD-10-CM | POA: Insufficient documentation

## 2018-03-06 DIAGNOSIS — C801 Malignant (primary) neoplasm, unspecified: Secondary | ICD-10-CM | POA: Insufficient documentation

## 2018-03-06 DIAGNOSIS — C786 Secondary malignant neoplasm of retroperitoneum and peritoneum: Secondary | ICD-10-CM | POA: Diagnosis not present

## 2018-03-06 DIAGNOSIS — Z79899 Other long term (current) drug therapy: Secondary | ICD-10-CM | POA: Insufficient documentation

## 2018-03-06 DIAGNOSIS — N390 Urinary tract infection, site not specified: Secondary | ICD-10-CM | POA: Diagnosis present

## 2018-03-06 DIAGNOSIS — K668 Other specified disorders of peritoneum: Secondary | ICD-10-CM | POA: Diagnosis present

## 2018-03-06 HISTORY — PX: LAPAROSCOPY: SHX197

## 2018-03-06 LAB — TYPE AND SCREEN
ABO/RH(D): A POS
ANTIBODY SCREEN: NEGATIVE

## 2018-03-06 SURGERY — LAPAROSCOPY, DIAGNOSTIC
Anesthesia: General | Site: Abdomen

## 2018-03-06 MED ORDER — SODIUM CHLORIDE 0.9 % IR SOLN
Status: DC | PRN
  Administered 2018-03-06: 3000 mL

## 2018-03-06 MED ORDER — EPHEDRINE SULFATE-NACL 50-0.9 MG/10ML-% IV SOSY
PREFILLED_SYRINGE | INTRAVENOUS | Status: DC | PRN
  Administered 2018-03-06 (×2): 5 mg via INTRAVENOUS

## 2018-03-06 MED ORDER — ACETAMINOPHEN 500 MG PO TABS
1000.0000 mg | ORAL_TABLET | ORAL | Status: AC
  Administered 2018-03-06: 1000 mg via ORAL
  Filled 2018-03-06: qty 2

## 2018-03-06 MED ORDER — DEXAMETHASONE SODIUM PHOSPHATE 10 MG/ML IJ SOLN
INTRAMUSCULAR | Status: AC
  Filled 2018-03-06: qty 1

## 2018-03-06 MED ORDER — GABAPENTIN 300 MG PO CAPS: 300.0000 mg | ORAL_CAPSULE | Freq: Two times a day (BID) | ORAL | Status: DC

## 2018-03-06 MED ORDER — DEXAMETHASONE SODIUM PHOSPHATE 4 MG/ML IJ SOLN: 4.0000 mg | INTRAMUSCULAR | Status: DC

## 2018-03-06 MED ORDER — BUPIVACAINE HCL (PF) 0.25 % IJ SOLN
INTRAMUSCULAR | Status: DC | PRN
  Administered 2018-03-06: 15 mL

## 2018-03-06 MED ORDER — LIDOCAINE 2% (20 MG/ML) 5 ML SYRINGE
INTRAMUSCULAR | Status: AC
  Filled 2018-03-06: qty 5

## 2018-03-06 MED ORDER — IBUPROFEN 600 MG PO TABS: 600.0000 mg | ORAL_TABLET | Freq: Four times a day (QID) | ORAL | 0 refills | Status: DC | PRN

## 2018-03-06 MED ORDER — FENTANYL CITRATE (PF) 250 MCG/5ML IJ SOLN
INTRAMUSCULAR | Status: AC
  Filled 2018-03-06: qty 5

## 2018-03-06 MED ORDER — PHENYLEPHRINE 40 MCG/ML (10ML) SYRINGE FOR IV PUSH (FOR BLOOD PRESSURE SUPPORT)
PREFILLED_SYRINGE | INTRAVENOUS | Status: DC | PRN
  Administered 2018-03-06: 100 ug via INTRAVENOUS
  Administered 2018-03-06 (×3): 80 ug via INTRAVENOUS

## 2018-03-06 MED ORDER — OXYCODONE HCL 5 MG PO TABS: 5.0000 mg | ORAL_TABLET | ORAL | Status: DC | PRN

## 2018-03-06 MED ORDER — ACETAMINOPHEN 500 MG PO TABS: 1000.0000 mg | ORAL_TABLET | Freq: Four times a day (QID) | ORAL | Status: DC

## 2018-03-06 MED ORDER — GABAPENTIN 300 MG PO CAPS
300.0000 mg | ORAL_CAPSULE | ORAL | Status: AC
  Administered 2018-03-06: 300 mg via ORAL
  Filled 2018-03-06: qty 1

## 2018-03-06 MED ORDER — ROCURONIUM BROMIDE 100 MG/10ML IV SOLN
INTRAVENOUS | Status: DC | PRN
  Administered 2018-03-06: 40 mg via INTRAVENOUS

## 2018-03-06 MED ORDER — FENTANYL CITRATE (PF) 250 MCG/5ML IJ SOLN
INTRAMUSCULAR | Status: DC | PRN
  Administered 2018-03-06: 50 ug via INTRAVENOUS
  Administered 2018-03-06 (×3): 25 ug via INTRAVENOUS
  Administered 2018-03-06: 50 ug via INTRAVENOUS
  Administered 2018-03-06: 25 ug via INTRAVENOUS

## 2018-03-06 MED ORDER — PROPOFOL 10 MG/ML IV BOLUS
INTRAVENOUS | Status: DC | PRN
  Administered 2018-03-06: 20 mg via INTRAVENOUS
  Administered 2018-03-06: 100 mg via INTRAVENOUS

## 2018-03-06 MED ORDER — FENTANYL CITRATE (PF) 100 MCG/2ML IJ SOLN
INTRAMUSCULAR | Status: AC
  Filled 2018-03-06: qty 2

## 2018-03-06 MED ORDER — EPHEDRINE 5 MG/ML INJ
INTRAVENOUS | Status: AC
  Filled 2018-03-06: qty 10

## 2018-03-06 MED ORDER — KETOROLAC TROMETHAMINE 15 MG/ML IJ SOLN: 15.0000 mg | Freq: Four times a day (QID) | INTRAMUSCULAR | Status: DC | PRN

## 2018-03-06 MED ORDER — ONDANSETRON HCL 4 MG/2ML IJ SOLN
INTRAMUSCULAR | Status: AC
  Filled 2018-03-06: qty 2

## 2018-03-06 MED ORDER — OXYCODONE HCL 5 MG/5ML PO SOLN
5.0000 mg | Freq: Once | ORAL | Status: DC | PRN
  Filled 2018-03-06: qty 5

## 2018-03-06 MED ORDER — ONDANSETRON HCL 4 MG PO TABS: 4.0000 mg | ORAL_TABLET | Freq: Four times a day (QID) | ORAL | Status: DC | PRN

## 2018-03-06 MED ORDER — ONDANSETRON HCL 4 MG/2ML IJ SOLN: 4.0000 mg | Freq: Four times a day (QID) | INTRAMUSCULAR | Status: DC | PRN

## 2018-03-06 MED ORDER — LACTATED RINGERS IV SOLN
INTRAVENOUS | Status: DC
  Administered 2018-03-06 (×2): via INTRAVENOUS

## 2018-03-06 MED ORDER — PROPOFOL 10 MG/ML IV BOLUS
INTRAVENOUS | Status: AC
  Filled 2018-03-06: qty 20

## 2018-03-06 MED ORDER — PHENYLEPHRINE 40 MCG/ML (10ML) SYRINGE FOR IV PUSH (FOR BLOOD PRESSURE SUPPORT)
PREFILLED_SYRINGE | INTRAVENOUS | Status: AC
  Filled 2018-03-06: qty 10

## 2018-03-06 MED ORDER — SUGAMMADEX SODIUM 200 MG/2ML IV SOLN
INTRAVENOUS | Status: AC
  Filled 2018-03-06: qty 2

## 2018-03-06 MED ORDER — BUPIVACAINE HCL (PF) 0.25 % IJ SOLN
INTRAMUSCULAR | Status: AC
  Filled 2018-03-06: qty 30

## 2018-03-06 MED ORDER — SUGAMMADEX SODIUM 200 MG/2ML IV SOLN
INTRAVENOUS | Status: DC | PRN
  Administered 2018-03-06: 140 mg via INTRAVENOUS

## 2018-03-06 MED ORDER — OXYCODONE-ACETAMINOPHEN 5-325 MG PO TABS: 1.0000 | ORAL_TABLET | ORAL | 0 refills | Status: DC | PRN

## 2018-03-06 MED ORDER — ONDANSETRON HCL 4 MG/2ML IJ SOLN
INTRAMUSCULAR | Status: DC | PRN
  Administered 2018-03-06: 4 mg via INTRAVENOUS

## 2018-03-06 MED ORDER — ONDANSETRON HCL 4 MG/2ML IJ SOLN
4.0000 mg | Freq: Once | INTRAMUSCULAR | Status: AC | PRN
  Administered 2018-03-06: 4 mg via INTRAVENOUS

## 2018-03-06 MED ORDER — CELECOXIB 200 MG PO CAPS: 200.0000 mg | ORAL_CAPSULE | Freq: Two times a day (BID) | ORAL | Status: DC

## 2018-03-06 MED ORDER — LIDOCAINE HCL (CARDIAC) PF 100 MG/5ML IV SOSY
PREFILLED_SYRINGE | INTRAVENOUS | Status: DC | PRN
  Administered 2018-03-06: 70 mg via INTRAVENOUS

## 2018-03-06 MED ORDER — OXYCODONE HCL 5 MG PO TABS: 5.0000 mg | ORAL_TABLET | Freq: Once | ORAL | Status: DC | PRN

## 2018-03-06 MED ORDER — HYDROMORPHONE HCL 1 MG/ML IJ SOLN: 0.2000 mg | INTRAMUSCULAR | Status: DC | PRN

## 2018-03-06 MED ORDER — DEXAMETHASONE SODIUM PHOSPHATE 4 MG/ML IJ SOLN
INTRAMUSCULAR | Status: DC | PRN
  Administered 2018-03-06: 10 mg via INTRAVENOUS

## 2018-03-06 MED ORDER — FENTANYL CITRATE (PF) 100 MCG/2ML IJ SOLN
25.0000 ug | INTRAMUSCULAR | Status: DC | PRN
  Administered 2018-03-06 (×2): 50 ug via INTRAVENOUS

## 2018-03-06 MED ORDER — SCOPOLAMINE 1 MG/3DAYS TD PT72
1.0000 | MEDICATED_PATCH | TRANSDERMAL | Status: DC
  Administered 2018-03-06: 1.5 mg via TRANSDERMAL
  Filled 2018-03-06: qty 1

## 2018-03-06 SURGICAL SUPPLY — 35 items
CABLE HIGH FREQUENCY MONO STRZ (ELECTRODE) IMPLANT
CHLORAPREP W/TINT 26ML (MISCELLANEOUS) ×2 IMPLANT
CONT SPEC 4OZ CLIKSEAL STRL BL (MISCELLANEOUS) ×2 IMPLANT
COVER WAND RF STERILE (DRAPES) IMPLANT
DECANTER SPIKE VIAL GLASS SM (MISCELLANEOUS) IMPLANT
DERMABOND ADVANCED (GAUZE/BANDAGES/DRESSINGS) ×1
DERMABOND ADVANCED .7 DNX12 (GAUZE/BANDAGES/DRESSINGS) ×1 IMPLANT
ELECT REM PT RETURN 9FT ADLT (ELECTROSURGICAL) ×2
ELECTRODE REM PT RTRN 9FT ADLT (ELECTROSURGICAL) ×1 IMPLANT
GLOVE BIO SURGEON STRL SZ 6 (GLOVE) ×4 IMPLANT
GLOVE BIO SURGEON STRL SZ 6.5 (GLOVE) ×4 IMPLANT
GOWN STRL REUS W/TWL LRG LVL3 (GOWN DISPOSABLE) ×4 IMPLANT
HOLDER FOLEY CATH W/STRAP (MISCELLANEOUS) IMPLANT
IRRIG SUCT STRYKERFLOW 2 WTIP (MISCELLANEOUS)
IRRIGATION SUCT STRKRFLW 2 WTP (MISCELLANEOUS) IMPLANT
MANIPULATOR UTERINE 4.5 ZUMI (MISCELLANEOUS) IMPLANT
PACK BASIN DAY SURGERY FS (CUSTOM PROCEDURE TRAY) ×2 IMPLANT
PAD POSITIONING PINK XL (MISCELLANEOUS) ×2 IMPLANT
POUCH SPECIMEN RETRIEVAL 10MM (ENDOMECHANICALS) IMPLANT
SCISSORS LAP 5X35 DISP (ENDOMECHANICALS) IMPLANT
SEALER TISSUE G2 CVD JAW 45CM (ENDOMECHANICALS) ×2 IMPLANT
SHEET LAVH (DRAPES) ×2 IMPLANT
SLEEVE XCEL OPT CAN 5 100 (ENDOMECHANICALS) ×2 IMPLANT
SUT MNCRL AB 4-0 PS2 18 (SUTURE) ×2 IMPLANT
SYS RETRIEVAL 5MM INZII UNIV (BASKET)
SYSTEM RETRIEVL 5MM INZII UNIV (BASKET) IMPLANT
TOWEL OR 17X26 10 PK STRL BLUE (TOWEL DISPOSABLE) ×2 IMPLANT
TOWEL OR NON WOVEN STRL DISP B (DISPOSABLE) ×2 IMPLANT
TRAY FOLEY W/BAG SLVR 16FR (SET/KITS/TRAYS/PACK) ×1
TRAY FOLEY W/BAG SLVR 16FR ST (SET/KITS/TRAYS/PACK) ×1 IMPLANT
TRAY LAPAROSCOPIC (CUSTOM PROCEDURE TRAY) ×2 IMPLANT
TROCAR BLADELESS OPT 5 100 (ENDOMECHANICALS) ×6 IMPLANT
TROCAR XCEL 12X100 BLDLESS (ENDOMECHANICALS) IMPLANT
TROCAR XCEL BLUNT TIP 100MML (ENDOMECHANICALS) IMPLANT
TROCAR XCEL NON-BLD 11X100MML (ENDOMECHANICALS) ×2 IMPLANT

## 2018-03-06 NOTE — Anesthesia Postprocedure Evaluation (Signed)
Anesthesia Post Note  Patient: Misty Hopkins  Procedure(s) Performed: LAPAROSCOPY DIAGNOSTIC OMENTAL BIOPSY (N/A Abdomen)     Patient location during evaluation: PACU Anesthesia Type: General Level of consciousness: awake and alert Pain management: pain level controlled Vital Signs Assessment: post-procedure vital signs reviewed and stable Respiratory status: spontaneous breathing, nonlabored ventilation and respiratory function stable Cardiovascular status: blood pressure returned to baseline and stable Postop Assessment: no apparent nausea or vomiting Anesthetic complications: no    Last Vitals:  Vitals:   03/06/18 1515 03/06/18 1521  BP: 101/64 138/81  Pulse: 84 81  Resp: 16 13  Temp:  36.5 C  SpO2: 95% 95%    Last Pain:  Vitals:   03/06/18 1500  TempSrc:   PainSc: 4                  Lidia Collum

## 2018-03-06 NOTE — Anesthesia Procedure Notes (Signed)
Procedure Name: Intubation Date/Time: 03/06/2018 1:00 PM Performed by: Raenette Rover, CRNA Pre-anesthesia Checklist: Patient identified, Emergency Drugs available, Suction available and Patient being monitored Patient Re-evaluated:Patient Re-evaluated prior to induction Oxygen Delivery Method: Circle system utilized Preoxygenation: Pre-oxygenation with 100% oxygen Induction Type: IV induction Ventilation: Mask ventilation without difficulty Laryngoscope Size: Miller and 2 Grade View: Grade II Tube type: Oral Tube size: 7.0 mm Number of attempts: 2 Airway Equipment and Method: Bougie stylet Placement Confirmation: positive ETCO2,  CO2 detector and breath sounds checked- equal and bilateral Secured at: 21 cm Tube secured with: Tape Dental Injury: Teeth and Oropharynx as per pre-operative assessment  Comments: Anterior airway. Bougie stylet utilized with Miller 2 blade.

## 2018-03-06 NOTE — Op Note (Signed)
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 Sigmoid colon densely adherent to left pelvic side wall consistent with history of diverticulitis. Omentum adherent to anterior abdominal wall and sigmoid colon. Surgically absent uterus. Right ovary very small and grossly normal. Left ovary obscured by adhesed sigmoid colon. No ascites. No carcinomatosis. There was a nodular firm distal omentum on the left which was adherent to the sigmoid colon and most consistent with inflammatory change.    Procedure Details  The patient was seen in the Holding Room. The risks, benefits, complications, treatment options, and expected outcomes were discussed with the patient.  The patient concurred with the proposed plan, giving informed consent.  The site of surgery properly noted/marked. The patient was identified asLinda Hopkins and the procedure verified as a laparoscopic omental biopsy. A Time Out was held and the above information confirmed.  After induction of anesthesia, the patient was draped and prepped in the usual sterile manner. Pt was placed in supine position after anesthesia and draped and prepped in the usual sterile manner. The abdominal drape was placed after the CholoraPrep had been allowed to dry for 3 minutes.  Her arms were tucked to her side with all appropriate precautions.  The chest was secured to the table.  The patient was placed in the semi-lithotomy position in [place].  The perineum was prepped with Betadine.  Foley catheter was placed.  OG tube placement was confirmed and to suction.   Procedure:  Quarter percent marcaine was  injected into all incision sites prior to making them with an 11 blade scalpel. A 5 mm skin incision was made in the left upper quadrant.  The 5 mm Optiview port and scope was used for direct entry.  Opening pressure was under 10 mm CO2.  The abdomen was insufflated and the findings were noted as above.   At this point and all points during the procedure, the patient's intra-abdominal pressure did not exceed 15 mmHg. The patient was placed in steep trendelenberg to visualize pelvic anatomy. Next, a 5 mm skin incision was made in the umbilicus and a 5 mm disposable port was placed through this intraperitoneally under direct visualization.  A 50mm incision was made in the left mid abdomen and a 77mm port was placed through this under direct visualization.   Images of the abdomen were taken.   The enseal bipolar device was used to carefully make windows in the omentum (distal, infracolic) to seal the omental vessels and separate the omentum from its abdominal wall attachments. The attachments to the sigmoid colon were similarly taken down with the enseal and scissor sharp dissection. A 4x4cm portion of infracolic omentum was resected that included the firm indurated tissue seen on CT scan. This was removed using enseal with care to ensure the transverse colon was free from the line of dissection. The umbilical port was upsized to a 10cm port and the specimen (omentum) was placed in an endocatch bag intact and retrieved from the umbilical port. Suction and irrigation was used to ensure hemostasis at all surgical sites. The bowel was again inspected and noted to be normal. Ports were removed under direct visualization. All entry sites were hemostatic. CO2 insufflation  was removed from the patient's abdomen.  The 10 mm port was closed with Vicryl on a UR-5 needle at the fascia.  The skin was closed with 4-0 monocryl in a subcuticular manner.  Dermabond was applied to the incisions.  Sponge, lap and needle counts correct  x 2.  The patient was taken to the recovery room in stable condition.  All instrument and needle counts were correct x  3.   The patient was transferred to the recovery room in a stable condition.  Estimated Blood Loss:  <25cc      Total IV Fluids: 700 ml         Specimens: omentum         Complications:  None; patient tolerated the procedure well.         Disposition: PACU - hemodynamically stable.         Thereasa Solo, MD

## 2018-03-06 NOTE — Discharge Instructions (Signed)
03/06/2018  Return to work: 4 weeks   Activity: 1. Be up and out of the bed during the day.  Take a nap if needed.  You may walk up steps but be careful and use the hand rail.  Stair climbing will tire you more than you think, you may need to stop part way and rest.   2. No lifting or straining for 4 weeks.  3. No driving for 1 weeks.  Do Not drive if you are taking narcotic pain medicine.  4. Shower daily.  Use soap and water on your incision and pat dry; don't rub.   5. No sexual activity and nothing in the vagina for 2 weeks.  Medications:  - Take ibuprofen and tylenol first line for pain control. Take these regularly (every 6 hours) to decrease the build up of pain.  - If necessary, for severe pain not relieved by ibuprofen, take percocet.  - While taking percocet you should take sennakot every night to reduce the likelihood of constipation. If this causes diarrhea, stop its use.  Diet: 1. Low sodium Heart Healthy Diet is recommended.  2. It is safe to use a laxative if you have difficulty moving your bowels.   Wound Care: 1. Keep clean and dry.  Shower daily.  Reasons to call the Doctor:   Fever - Oral temperature greater than 100.4 degrees Fahrenheit  Foul-smelling vaginal discharge  Difficulty urinating  Nausea and vomiting  Increased pain at the site of the incision that is unrelieved with pain medicine.  Difficulty breathing with or without chest pain  New calf pain especially if only on one side  Sudden, continuing increased vaginal bleeding with or without clots.   Follow-up: 1. See Everitt Amber in 2-3 weeks. She will call you before that time with pathology results. She suspects that the omental does not have cancer in it, but rather is inflamed from adjacent (former) infection in the colon. Pathology is pending.  Contacts: For questions or concerns you should contact:  Dr. Everitt Amber at [phone] After hours and on week-ends call [phone] and  ask to speak to the physician on call for Gynecologic Oncology

## 2018-03-06 NOTE — Anesthesia Preprocedure Evaluation (Addendum)
Anesthesia Evaluation  Patient identified by MRN, date of birth, ID band Patient awake    Reviewed: Allergy & Precautions, NPO status , Patient's Chart, lab work & pertinent test results  History of Anesthesia Complications Negative for: history of anesthetic complications  Airway Mallampati: II  TM Distance: >3 FB Neck ROM: Full    Dental no notable dental hx.    Pulmonary neg pulmonary ROS,    Pulmonary exam normal        Cardiovascular hypertension, Normal cardiovascular exam     Neuro/Psych negative neurological ROS  negative psych ROS   GI/Hepatic Neg liver ROS, GERD  ,  Endo/Other  negative endocrine ROS  Renal/GU negative Renal ROS  negative genitourinary   Musculoskeletal negative musculoskeletal ROS (+)   Abdominal   Peds  Hematology negative hematology ROS (+)   Anesthesia Other Findings   Reproductive/Obstetrics                            Anesthesia Physical Anesthesia Plan  ASA: III  Anesthesia Plan: General   Post-op Pain Management:    Induction: Intravenous  PONV Risk Score and Plan: 4 or greater and Ondansetron, Dexamethasone and Treatment may vary due to age or medical condition  Airway Management Planned: Oral ETT  Additional Equipment: None  Intra-op Plan:   Post-operative Plan: Extubation in OR  Informed Consent: I have reviewed the patients History and Physical, chart, labs and discussed the procedure including the risks, benefits and alternatives for the proposed anesthesia with the patient or authorized representative who has indicated his/her understanding and acceptance.     Plan Discussed with:   Anesthesia Plan Comments:        Anesthesia Quick Evaluation

## 2018-03-06 NOTE — Interval H&P Note (Signed)
History and Physical Interval Note:  03/06/2018 12:49 PM  Misty Hopkins Pain  has presented today for surgery, with the diagnosis of OMENTAL MASS  The various methods of treatment have been discussed with the patient and family. After consideration of risks, benefits and other options for treatment, the patient has consented to  Procedure(s): LAPAROSCOPY DIAGNOSTIC OMENTAL BIOPSY (N/A) as a surgical intervention .  The patient's history has been reviewed, patient examined, no change in status, stable for surgery.  I have reviewed the patient's chart and labs.  Questions were answered to the patient's satisfaction.     Thereasa Solo

## 2018-03-06 NOTE — Transfer of Care (Signed)
Immediate Anesthesia Transfer of Care Note  Patient: Misty Hopkins  Procedure(s) Performed: LAPAROSCOPY DIAGNOSTIC OMENTAL BIOPSY (N/A Abdomen)  Patient Location: PACU  Anesthesia Type:General  Level of Consciousness: awake, alert , oriented and patient cooperative  Airway & Oxygen Therapy: Patient Spontanous Breathing and Patient connected to face mask oxygen  Post-op Assessment: Report given to RN and Post -op Vital signs reviewed and stable  Post vital signs: Reviewed and stable  Last Vitals:  Vitals Value Taken Time  BP 158/86 03/06/2018  2:18 PM  Temp    Pulse 95 03/06/2018  2:20 PM  Resp 9 03/06/2018  2:20 PM  SpO2 100 % 03/06/2018  2:20 PM  Vitals shown include unvalidated device data.  Last Pain:  Vitals:   03/06/18 1153  TempSrc:   PainSc: 3       Patients Stated Pain Goal: 4 (75/[id number])  Complications: No apparent anesthesia complications

## 2018-03-07 ENCOUNTER — Encounter (HOSPITAL_BASED_OUTPATIENT_CLINIC_OR_DEPARTMENT_OTHER): Payer: Self-pay | Admitting: Gynecologic Oncology

## 2018-03-08 ENCOUNTER — Inpatient Hospital Stay
Admission: RE | Admit: 2018-03-08 | Discharge: 2018-03-08 | Disposition: A | Discharge status: Home / Self Care | Payer: Self-pay | Source: Ambulatory Visit | Attending: Gynecologic Oncology | Admitting: Gynecologic Oncology

## 2018-03-08 ENCOUNTER — Other Ambulatory Visit: Payer: Self-pay | Admitting: Gynecologic Oncology

## 2018-03-08 ENCOUNTER — Telehealth: Payer: Self-pay

## 2018-03-08 DIAGNOSIS — R300 Dysuria: Secondary | ICD-10-CM

## 2018-03-08 DIAGNOSIS — C801 Malignant (primary) neoplasm, unspecified: Secondary | ICD-10-CM

## 2018-03-08 MED ORDER — NITROFURANTOIN MONOHYD MACRO 100 MG PO CAPS: 100.0000 mg | ORAL_CAPSULE | Freq: Two times a day (BID) | ORAL | 0 refills | Status: DC

## 2018-03-08 NOTE — Telephone Encounter (Signed)
Outgoing call to pt per [facility] cross NP regarding urine culture results and if pt is having any symptoms. Pt reports "a little stinging with urination"  - notified Melissa NP.  Order for Macrobid 100 mg twice a day for 3 days. Encouraged to drink plenty of fluids especially water and call for worsening symptoms. Pt voiced understanding. No other needs per pt at this time.

## 2018-03-09 ENCOUNTER — Inpatient Hospital Stay
Admission: RE | Admit: 2018-03-09 | Discharge: 2018-03-09 | Disposition: A | Discharge status: Home / Self Care | Payer: Self-pay | Source: Ambulatory Visit | Attending: Gynecologic Oncology | Admitting: Gynecologic Oncology

## 2018-03-09 ENCOUNTER — Other Ambulatory Visit: Payer: Self-pay | Admitting: Gynecologic Oncology

## 2018-03-09 DIAGNOSIS — C801 Malignant (primary) neoplasm, unspecified: Secondary | ICD-10-CM

## 2018-03-10 ENCOUNTER — Telehealth: Payer: Self-pay | Admitting: Gynecologic Oncology

## 2018-03-10 DIAGNOSIS — C562 Malignant neoplasm of left ovary: Secondary | ICD-10-CM | POA: Insufficient documentation

## 2018-03-10 NOTE — Telephone Encounter (Signed)
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  Recommendation is for ex lap, omentectomy, BSO, possible bowel resection.  Will perform this on 03/21/18.

## 2018-03-13 NOTE — Patient Instructions (Addendum)
Misty Hopkins  03/13/2018   Your procedure is scheduled on: 03-21-18     Report to [facility] Main  Entrance    Report to Admitting at 8:14 AM    Call this number if you have problems the morning of surgery [phone]   Eat a light diet the day before surgery.  Examples including soups, broths, toast, yogurt, mashed potatoes.  Things to avoid include carbonated beverages (fizzy beverages), raw fruits and raw vegetables, or beans.   If your bowels are filled with gas, your surgeon will have difficulty visualizing your pelvic organs which increases your surgical risks.   Remember:. NO SOLID FOOD AFTER MIDNIGHT THE NIGHT PRIOR TO SURGERY. NOTHING BY MOUTH EXCEPT CLEAR LIQUIDS UNTIL 3 HOURS PRIOR TO [place] SURGERY. PLEASE FINISH ENSURE DRINK PER SURGEON ORDER 3 HOURS PRIOR TO SCHEDULED SURGERY TIME WHICH NEEDS TO BE COMPLETED AT 7:14 AM ____________.    CLEAR LIQUID DIET   Foods Allowed                                                                     Foods Excluded  Coffee and tea, regular and decaf                             liquids that you cannot  Plain Jell-O in any flavor                                             see through such as: Fruit ices (not with fruit pulp)                                     milk, soups, orange juice  Iced Popsicles                                    All solid food Carbonated beverages, regular and diet                                    Cranberry, grape and apple juices Sports drinks like Gatorade Lightly seasoned clear broth or consume(fat free) Sugar, honey syrup  Sample Menu Breakfast                                Lunch                                     Supper Cranberry juice                    Beef broth  Chicken broth Jell-O                                     Grape juice                           Apple juice Coffee or tea                        Jell-O                                       Popsicle                                                Coffee or tea                        Coffee or tea  _____________________________________________________________________    BRUSH YOUR TEETH MORNING OF SURGERY AND RINSE YOUR MOUTH OUT, NO CHEWING GUM CANDY OR MINTS.     Take these medicines the morning of surgery with A SIP OF WATER: You may bring and use your eyedrops as needed.                                You may not have any metal on your body including hair pins and              piercings  Do not wear jewelry, make-up, lotions, powders or perfumes, deodorant             Do not wear nail polish.  Do not shave  48 hours prior to surgery.     Do not bring valuables to the hospital. [place].  Contacts, dentures or bridgework may not be worn into surgery.  Leave suitcase in the car. After surgery it may be brought to your room.     Patients discharged the day of surgery will not be allowed to drive home.  Name and phone number of your driver:  Special Instructions: Cough and Deep Breath              Please read over the following fact sheets you were given: _____________________________________________________________________             [facility] - Preparing for Surgery Before surgery, you can play an important role.  Because skin is not sterile, your skin needs to be as free of germs as possible.  You can reduce the number of germs on your skin by washing with CHG (chlorahexidine gluconate) soap before surgery.  CHG is an antiseptic cleaner which kills germs and bonds with the skin to continue killing germs even after washing. Please DO NOT use if you have an allergy to CHG or antibacterial soaps.  If your skin becomes reddened/irritated stop using the CHG and inform your nurse when you arrive at Short Stay. Do not shave (including legs and underarms) for at least 48 hours prior to  the first CHG  shower.  You may shave your face/neck. Please follow these instructions carefully:  1.  Shower with CHG Soap the night before surgery and the  morning of Surgery.  2.  If you choose to wash your hair, wash your hair first as usual with your  normal  shampoo.  3.  After you shampoo, rinse your hair and body thoroughly to remove the  shampoo.                           4.  Use CHG as you would any other liquid soap.  You can apply chg directly  to the skin and wash                       Gently with a scrungie or clean washcloth.  5.  Apply the CHG Soap to your body ONLY FROM THE NECK DOWN.   Do not use on face/ open                           Wound or open sores. Avoid contact with eyes, ears mouth and genitals (private parts).                       Wash face,  Genitals (private parts) with your normal soap.             6.  Wash thoroughly, paying special attention to the area where your surgery  will be performed.  7.  Thoroughly rinse your body with warm water from the neck down.  8.  DO NOT shower/wash with your normal soap after using and rinsing off  the CHG Soap.                9.  Pat yourself dry with a clean towel.            10.  Wear clean pajamas.            11.  Place clean sheets on your bed the night of your first shower and do not  sleep with pets. Day of Surgery : Do not apply any lotions/deodorants the morning of surgery.  Please wear clean clothes to the hospital/surgery center.  FAILURE TO FOLLOW THESE INSTRUCTIONS MAY RESULT IN THE CANCELLATION OF YOUR SURGERY PATIENT SIGNATURE_________________________________  NURSE SIGNATURE__________________________________  ________________________________________________________________________   Adam Phenix  An incentive spirometer is a tool that can help keep your lungs clear and active. This tool measures how well you are filling your lungs with each breath. Taking long deep breaths may help reverse or decrease the chance  of developing breathing (pulmonary) problems (especially infection) following:  A long period of time when you are unable to move or be active. BEFORE THE PROCEDURE   If the spirometer includes an indicator to show your best effort, your nurse or respiratory therapist will set it to a desired goal.  If possible, sit up straight or lean slightly forward. Try not to slouch.  Hold the incentive spirometer in an upright position. INSTRUCTIONS FOR USE  1. Sit on the edge of your bed if possible, or sit up as far as you can in bed or on a chair. 2. Hold the incentive spirometer in an upright position. 3. Breathe out normally. 4. Place the mouthpiece in your mouth and seal your lips tightly around it. 5. Breathe in slowly  and as deeply as possible, raising the piston or the ball toward the top of the column. 6. Hold your breath for 3-5 seconds or for as long as possible. Allow the piston or ball to fall to the bottom of the column. 7. Remove the mouthpiece from your mouth and breathe out normally. 8. Rest for a few seconds and repeat Steps 1 through 7 at least 10 times every 1-2 hours when you are awake. Take your time and take a few normal breaths between deep breaths. 9. The spirometer may include an indicator to show your best effort. Use the indicator as a goal to work toward during each repetition. 10. After each set of 10 deep breaths, practice coughing to be sure your lungs are clear. If you have an incision (the cut made at the time of surgery), support your incision when coughing by placing a pillow or rolled up towels firmly against it. Once you are able to get out of bed, walk around indoors and cough well. You may stop using the incentive spirometer when instructed by your caregiver.  RISKS AND COMPLICATIONS  Take your time so you do not get dizzy or light-headed.  If you are in pain, you may need to take or ask for pain medication before doing incentive spirometry. It is harder to  take a deep breath if you are having pain. AFTER USE  Rest and breathe slowly and easily.  It can be helpful to keep track of a log of your progress. Your caregiver can provide you with a simple table to help with this. If you are using the spirometer at home, follow these instructions: [place] IF:   You are having difficultly using the spirometer.  You have trouble using the spirometer as often as instructed.  Your pain medication is not giving enough relief while using the spirometer.  You develop fever of 100.5 F (38.1 C) or higher. SEEK IMMEDIATE MEDICAL CARE IF:   You cough up bloody sputum that had not been present before.  You develop fever of 102 F (38.9 C) or greater.  You develop worsening pain at or near the incision site. MAKE SURE YOU:   Understand these instructions.  Will watch your condition.  Will get help right away if you are not doing well or get worse. Document Released: 08/23/2006 Document Revised: 07/05/2011 Document Reviewed: 10/24/2006 Exit[employer] Patient Information 2014 [employer], [place].   ________________________________________________________________________  WHAT IS A BLOOD TRANSFUSION? Blood Transfusion Information  A transfusion is the replacement of blood or some of its parts. Blood is made up of multiple cells which provide different functions.  Red blood cells carry oxygen and are used for blood loss replacement.  White blood cells fight against infection.  Platelets control bleeding.  Plasma helps clot blood.  Other blood products are available for specialized needs, such as hemophilia or other clotting disorders. BEFORE THE TRANSFUSION  Who gives blood for transfusions?   Healthy volunteers who are fully evaluated to make sure their blood is safe. This is blood bank blood. Transfusion therapy is the safest it has ever been in the practice of medicine. Before blood is taken from a donor, a complete history is taken to  make sure that person has no history of diseases nor engages in risky social behavior (examples are intravenous drug use or sexual activity with multiple partners). The donor's travel history is screened to minimize risk of transmitting infections, such as malaria. The donated blood is tested for signs  of infectious diseases, such as HIV and hepatitis. The blood is then tested to be sure it is compatible with you in order to minimize the chance of a transfusion reaction. If you or a relative donates blood, this is often done in anticipation of surgery and is not appropriate for emergency situations. It takes many days to process the donated blood. RISKS AND COMPLICATIONS Although transfusion therapy is very safe and saves many lives, the main dangers of transfusion include:   Getting an infectious disease.  Developing a transfusion reaction. This is an allergic reaction to something in the blood you were given. Every precaution is taken to prevent this. The decision to have a blood transfusion has been considered carefully by your caregiver before blood is given. Blood is not given unless the benefits outweigh the risks. AFTER THE TRANSFUSION  Right after receiving a blood transfusion, you will usually feel much better and more energetic. This is especially true if your red blood cells have gotten low (anemic). The transfusion raises the level of the red blood cells which carry oxygen, and this usually causes an energy increase.  The nurse administering the transfusion will monitor you carefully for complications. HOME CARE INSTRUCTIONS  No special instructions are needed after a transfusion. You may find your energy is better. Speak with your caregiver about any limitations on activity for underlying diseases you may have. SEEK MEDICAL CARE IF:   Your condition is not improving after your transfusion.  You develop redness or irritation at the intravenous (IV) site. SEEK IMMEDIATE MEDICAL CARE  IF:  Any of the following symptoms occur over the next 12 hours:  Shaking chills.  You have a temperature by mouth above 102 F (38.9 C), not controlled by medicine.  Chest, back, or muscle pain.  People around you feel you are not acting correctly or are confused.  Shortness of breath or difficulty breathing.  Dizziness and fainting.  You get a rash or develop hives.  You have a decrease in urine output.  Your urine turns a dark color or changes to pink, red, or brown. Any of the following symptoms occur over the next 10 days:  You have a temperature by mouth above 102 F (38.9 C), not controlled by medicine.  Shortness of breath.  Weakness after normal activity.  The white part of the eye turns yellow (jaundice).  You have a decrease in the amount of urine or are urinating less often.  Your urine turns a dark color or changes to pink, red, or brown. Document Released: 04/09/2000 Document Revised: 07/05/2011 Document Reviewed: 11/27/2007 [facility] Patient Information 2014 [place], [place].  _______________________________________________________________________

## 2018-03-15 ENCOUNTER — Encounter (HOSPITAL_COMMUNITY)
Admission: RE | Admit: 2018-03-15 | Discharge: 2018-03-15 | Disposition: A | Discharge status: Home / Self Care | Payer: Medicare HMO | Source: Ambulatory Visit | Attending: Gynecologic Oncology | Admitting: Gynecologic Oncology

## 2018-03-15 ENCOUNTER — Encounter (HOSPITAL_COMMUNITY): Payer: Self-pay

## 2018-03-15 ENCOUNTER — Inpatient Hospital Stay: Payer: Medicare HMO | Admitting: Gynecologic Oncology

## 2018-03-15 ENCOUNTER — Encounter: Payer: Self-pay | Admitting: Gynecologic Oncology

## 2018-03-15 ENCOUNTER — Other Ambulatory Visit: Payer: Self-pay

## 2018-03-15 VITALS — BP 126/75 | HR 77 | Temp 98.2°F | Resp 20 | Ht 64.0 in | Wt 148.9 lb

## 2018-03-15 DIAGNOSIS — Z01818 Encounter for other preprocedural examination: Secondary | ICD-10-CM

## 2018-03-15 DIAGNOSIS — Z01812 Encounter for preprocedural laboratory examination: Secondary | ICD-10-CM | POA: Diagnosis present

## 2018-03-15 DIAGNOSIS — L03316 Cellulitis of umbilicus: Secondary | ICD-10-CM

## 2018-03-15 DIAGNOSIS — C801 Malignant (primary) neoplasm, unspecified: Secondary | ICD-10-CM

## 2018-03-15 DIAGNOSIS — C786 Secondary malignant neoplasm of retroperitoneum and peritoneum: Secondary | ICD-10-CM

## 2018-03-15 DIAGNOSIS — C562 Malignant neoplasm of left ovary: Secondary | ICD-10-CM

## 2018-03-15 LAB — COMPREHENSIVE METABOLIC PANEL
ALBUMIN: 4.1 g/dL (ref 3.5–5.0)
ALK PHOS: 45 U/L (ref 38–126)
ALT: 14 U/L (ref 0–44)
ANION GAP: 6 (ref 5–15)
AST: 17 U/L (ref 15–41)
BILIRUBIN TOTAL: 0.7 mg/dL (ref 0.3–1.2)
BUN: 18 mg/dL (ref 8–23)
CALCIUM: 8.9 mg/dL (ref 8.9–10.3)
CO2: 26 mmol/L (ref 22–32)
Chloride: 106 mmol/L (ref 98–111)
Creatinine, Ser: 0.64 mg/dL (ref 0.44–1.00)
GFR calc Af Amer: >60 mL/min (ref >60)
GLUCOSE: 110 mg/dL — AB (ref 70–99)
POTASSIUM: 4.9 mmol/L (ref 3.5–5.1)
Sodium: 138 mmol/L (ref 135–145)
TOTAL PROTEIN: 7 g/dL (ref 6.5–8.1)

## 2018-03-15 LAB — CBC
HEMATOCRIT: 41.3 % (ref 36.0–46.0)
HEMOGLOBIN: 12.9 g/dL (ref 12.0–15.0)
MCH: 28.9 pg (ref 26.0–34.0)
MCHC: 31.2 g/dL (ref 30.0–36.0)
MCV: 92.4 fL (ref 80.0–100.0)
Platelets: 210 10*3/uL (ref 150–400)
RBC: 4.47 MIL/uL (ref 3.87–5.11)
RDW: 13.5 % (ref 11.5–15.5)
WBC: 9.3 10*3/uL (ref 4.0–10.5)
nRBC: 0 % (ref 0.0–0.2)

## 2018-03-15 LAB — URINALYSIS, ROUTINE W REFLEX MICROSCOPIC
BACTERIA UA: NONE SEEN
BILIRUBIN URINE: NEGATIVE
Glucose, UA: NEGATIVE mg/dL
HGB URINE DIPSTICK: NEGATIVE
Ketones, ur: 5 mg/dL — AB
Nitrite: NEGATIVE
PROTEIN: NEGATIVE mg/dL
Specific Gravity, Urine: 1.024 (ref 1.005–1.030)
pH: 5 (ref 5.0–8.0)

## 2018-03-15 MED ORDER — OXYCODONE HCL 5 MG PO TABS: 5.0000 mg | ORAL_TABLET | ORAL | 0 refills | Status: DC | PRN

## 2018-03-15 MED ORDER — SENNOSIDES-DOCUSATE SODIUM 8.6-50 MG PO TABS: 2.0000 | ORAL_TABLET | Freq: Every day | ORAL | 1 refills | Status: DC

## 2018-03-15 MED ORDER — ENOXAPARIN SODIUM 40 MG/0.4ML ~~LOC~~ SOLN: 40.0000 mg | SUBCUTANEOUS | 0 refills | Status: DC

## 2018-03-15 MED ORDER — CEPHALEXIN 250 MG PO CAPS: 250.0000 mg | ORAL_CAPSULE | Freq: Four times a day (QID) | ORAL | 0 refills | Status: DC

## 2018-03-15 NOTE — Patient Instructions (Addendum)
Preparing for your Surgery  Plan for surgery on March 21, 2018 with Dr. Everitt Amber at [place] will be scheduled for an exploratory laparotomy, bilateral salpingo-oophorectomy, omentectomy, radical tumor debulking, possible bowel resection.   Pre-operative Testing -You will receive a phone call from presurgical testing at [facility] to arrange for a pre-operative testing appointment before your surgery.  This appointment normally occurs one to two weeks before your scheduled surgery.   -Bring your insurance card, copy of an advanced directive if applicable, medication list  -At that visit, you will be asked to sign a consent for a possible blood transfusion in case a transfusion becomes necessary during surgery.  The need for a blood transfusion is rare but having consent is a necessary part of your care.     -You should not be taking blood thinners or aspirin at least ten days prior to surgery unless instructed by your surgeon.  Day Before Surgery at [place] will be asked to take in a light diet the day before surgery.  Avoid carbonated beverages.  You will be advised to have nothing to eat or drink after midnight the evening before.    Eat a light diet the day before surgery.  Examples including soups, broths, toast, yogurt, mashed potatoes.  Things to avoid include carbonated beverages (fizzy beverages), raw fruits and raw vegetables, or beans.   Starting at 4pm the day before surgery, begin drinking one bottle of magnesium citrate and only take in clear liquids after that time.  If your bowels are filled with gas, your surgeon will have difficulty visualizing your pelvic organs which increases your surgical risks.  Your role in recovery Your role is to become active as soon as directed by your doctor, while still giving yourself time to heal.  Rest when you feel tired. You will be asked to do the following in order to speed your recovery:  -  Cough and breathe deeply. This helps toclear and expand your lungs and can prevent pneumonia. You may be given a spirometer to practice deep breathing. A staff member will show you how to use the spirometer. - Do mild physical activity. Walking or moving your legs help your circulation and body functions return to normal. A staff member will help you when you try to walk and will provide you with simple exercises. Do not try to get up or walk alone the first time. - Actively manage your pain. Managing your pain lets you move in comfort. We will ask you to rate your pain on a scale of zero to 10. It is your responsibility to tell your doctor or nurse where and how much you hurt so your pain can be treated.  Special Considerations -If you are diabetic, you may be placed on insulin after surgery to have closer control over your blood sugars to promote healing and recovery.  This does not mean that you will be discharged on insulin.  If applicable, your oral antidiabetics will be resumed when you are tolerating a solid diet.  -Your final pathology results from surgery should be available around one week after surgery and the results will be relayed to you when available.  -Dr. Lahoma Crocker is the Surgeon that assists your GYN Oncologist with surgery.  The next day after your surgery you will either see your GYN Oncologist, Dr. Precious Haws, or Dr. Lahoma Crocker.  -FMLA forms can be faxed to [phone] and please allow 5-7 business days for completion.  Blood Transfusion Information WHAT IS A BLOOD TRANSFUSION? A transfusion is the replacement of blood or some of its parts. Blood is made up of multiple cells which provide different functions.  Red blood cells carry oxygen and are used for blood loss replacement.  White blood cells fight against infection.  Platelets control bleeding.  Plasma helps clot blood.  Other blood products are available for specialized needs, such as  hemophilia or other clotting disorders. BEFORE THE TRANSFUSION  Who gives blood for transfusions?   You may be able to donate blood to be used at a later date on yourself (autologous donation).  Relatives can be asked to donate blood. This is generally not any safer than if you have received blood from a stranger. The same precautions are taken to ensure safety when a relative's blood is donated.  Healthy volunteers who are fully evaluated to make sure their blood is safe. This is blood bank blood. Transfusion therapy is the safest it has ever been in the practice of medicine. Before blood is taken from a donor, a complete history is taken to make sure that person has no history of diseases nor engages in risky social behavior (examples are intravenous drug use or sexual activity with multiple partners). The donor's travel history is screened to minimize risk of transmitting infections, such as malaria. The donated blood is tested for signs of infectious diseases, such as HIV and hepatitis. The blood is then tested to be sure it is compatible with you in order to minimize the chance of a transfusion reaction. If you or a relative donates blood, this is often done in anticipation of surgery and is not appropriate for emergency situations. It takes many days to process the donated blood. RISKS AND COMPLICATIONS Although transfusion therapy is very safe and saves many lives, the main dangers of transfusion include:   Getting an infectious disease.  Developing a transfusion reaction. This is an allergic reaction to something in the blood you were given. Every precaution is taken to prevent this. The decision to have a blood transfusion has been considered carefully by your caregiver before blood is given. Blood is not given unless the benefits outweigh the risks.   How and Where to Give Subcutaneous Enoxaparin Injections Enoxaparin is an injectable medicine. It is used to help prevent blood clots  from developing in your veins. Health care providers often use anticoagulants like enoxaparin to prevent clots following surgery. Enoxaparin is also used in combination with other medicines to treat blood clots and heart attacks. If blood clots are left untreated, they can be life threatening. Enoxaparin comes in single-use syringes. You inject enoxaparin through a syringe into your belly (abdomen). You should change the injection site each time you give yourself a shot. Continue the enoxaparin injections as directed by your health care provider. Your health care provider will use blood clotting test results to decide when you can safely stop using enoxaparin injections. If your health care provider prescribes any additional medicines, use the medicines exactly as directed. How do I inject enoxaparin? 1. Wash your hands with soap and water. 2. Clean the selected injection site as directed by your health care provider. 3. Remove the needle cap by pulling it straight off the syringe. 4. When using a prefilled syringe, do not push the air bubble out of the syringe before the injection. The air bubble will help you get all of the medicine out of the syringe. 5. Hold the syringe like a pencil using  your writing hand. 6. Use your other hand to pinch and hold an inch of the cleansed skin. 7. Insert the entire needle straight down into the fold of skin. 8. Push the plunger with your thumb until the syringe is empty. 9. Pull the needle straight out of your skin. 10. Enoxaparin injection prefilled syringes and graduated prefilled syringes are available with a system that shields the needle after injection. After you have completed your injection and removed the needle from your skin, firmly push down on the plunger. The protective sleeve will automatically cover the needle and you will hear a click. The click means the needle is safely covered. 11. Place the syringe in the nearest needle box, also called a sharps  container. If you do not have a sharps container, you can use a hard-sided plastic container with a secure lid, such as an empty laundry detergent bottle. What else do I need to know?  Do not use enoxaparin if: ? You have allergies to heparin or pork products. ? You have been diagnosed with a condition called thrombocytopenia.  Do not use the syringe or needle more than one time.  Use medicines only as directed by your health care provider.  Changes in medicines, supplements, diet, and illness can affect your anticoagulation therapy. Be sure to inform your health care provider of any of these changes.  It is important that you tell all of your health care providers and your dentist that you are taking an anticoagulant, especially if you are injured or plan to have any type of procedure.  While on anticoagulants, you will need to have blood tests done routinely as directed by your health care provider.  While using this medicine, avoid physical activities or sports that could result in a fall or cause injury.  Follow up with your laboratory test and health care provider appointments as directed. It is very important to keep your appointments. Not keeping appointments could result in a chronic or permanent injury, pain, or disability.  Before giving your medicine, you should make sure the injection is a clear and colorless or pale yellow solution. If your medicine becomes discolored or if there are particles in the syringe, do not use it and notify your health care provider.  Keep your medicine safely stored at room temperature. Contact a health care provider if:  You develop any rashes on your skin.  You have large areas of bruising on your skin.  You have any worsening of the condition for which you take Enoxaparin.  You develop a fever. Get help right away if:  You develop bleeding problems such as: ? Bleeding from the gums or nose that does not stop quickly. ? Vomiting blood or  coughing up blood. ? Blood in your urine. ? Blood in your stool, or stool that has a dark, tarry, or coffee grounds appearance. ? A cut that does not stop bleeding within 10 minutes. These symptoms may represent a serious problem that is an emergency. Do not wait to see if the symptoms will go away. Get medical help right away. Call your local emergency services (911 in the [place] Do not drive yourself to the hospital. This information is not intended to replace advice given to you by your health care provider. Make sure you discuss any questions you have with your health care provider. Document Released: 02/12/2004 Document Revised: 12/18/2015 Document Reviewed: 09/27/2013 Elsevier Interactive Patient Education  2018 [employer].

## 2018-03-15 NOTE — Progress Notes (Signed)
03-15-18 UA result routed to Dr. Serita Grit office for review

## 2018-03-15 NOTE — Progress Notes (Signed)
Patient here with her husband for a pre-operative appointment prior to her scheduled surgery on March 21, 2018. She is scheduled for an exploratory laparotomy, bilateral salpingo-oophorectomy, omentectomy, radical tumor debulking, possible bowel resection.  She had her pre-admission testing appointment this am at [facility].  The surgery was discussed in detail and visual aids used.  See after visit summary for additional details. Visual aids used to discuss items related to surgery including the incentive spirometer, sequential compression stockings, foley catheter, IV pump, multi-modal pain regimen including tylenol and ibuprofen. We discussed the need for a bowel prep to start the day before surgery at 4 pm consisting of one bottle of magnesium citrate.     Discussed post-op pain management in detail including the aspects of the enhanced recovery pathway. She has been taking percocet intermittently after her recent diagnostic laparoscopy. Advised her that a new prescription would be sent in for oxycodone and it is only to be used for after her upcoming surgery.  Discussed that the tylenol had been removed so it was oxycodone alone so she would be able to take tylenol on a more scheduled basis with monitoring for the max of 4000 mg in a 24 hour period.  Also prescribed sennakot to be used after surgery.  Discussed bowel regimen in detail.    Dicussed the use of lovenox pre-op, post-op, and at home.  Patient and husband instructed on administration and all questions answered.  She will receive a teaching kit during her hospital stay.  Prescription for lovenox sent to pharmacy and she is advised to only take this after surgery when she is discharged.  Patient with multiple questions about chemotherapy.  She states her brother had cancer. She would like to have a port-a-cath.  All questions answered.  She is advised that her appointments will be arranged for her after surgery to meet with the medical oncologist,  genetics counselor, etc.  The role of genetics in gyn related cancers discussed.  All questions answered.  After discussion, patient wanted her incisions assessed. She reported moderate discomfort with the left abdominal incision and states it appears swollen.  Denies fever or chills or drainage.  See media for photo of abdomen.  Resolving ecchymosis noted.  Increased warmth below the incision at the umbilicus with dried drainage present.  Golf ball size firmness noted with the left abdominal incision.  Tender to the touch.  Due to erythema and increased warmth to the incisions, plan to prescribe keflex to be taken four times daily up until the day before surgery. Dr. Denman George aware.  90 minutes spent with the patient.  Verbalizing understanding of material discussed. No needs or concerns voiced at the end of the visit.

## 2018-03-16 ENCOUNTER — Telehealth: Payer: Self-pay | Admitting: *Deleted

## 2018-03-16 NOTE — Telephone Encounter (Signed)
Per patient request I have faxed Joylene John APP note, OP note and path report, and Dr. Denman George note to [place].

## 2018-03-21 ENCOUNTER — Encounter (HOSPITAL_COMMUNITY): Payer: Self-pay | Admitting: *Deleted

## 2018-03-21 ENCOUNTER — Encounter (HOSPITAL_COMMUNITY)
Admission: RE | Disposition: A | Discharge status: Home / Self Care | Payer: Self-pay | Source: Home / Self Care | Attending: Gynecologic Oncology

## 2018-03-21 ENCOUNTER — Inpatient Hospital Stay (HOSPITAL_COMMUNITY)
Admission: RE | Admit: 2018-03-21 | Discharge: 2018-03-23 | DRG: 354 | Disposition: A | Discharge status: Home / Self Care | Payer: Medicare HMO | Attending: Gynecologic Oncology | Admitting: Gynecologic Oncology

## 2018-03-21 ENCOUNTER — Inpatient Hospital Stay (HOSPITAL_COMMUNITY): Payer: Medicare HMO | Admitting: Certified Registered Nurse Anesthetist

## 2018-03-21 ENCOUNTER — Other Ambulatory Visit: Payer: Self-pay

## 2018-03-21 DIAGNOSIS — C786 Secondary malignant neoplasm of retroperitoneum and peritoneum: Secondary | ICD-10-CM | POA: Diagnosis present

## 2018-03-21 DIAGNOSIS — K429 Umbilical hernia without obstruction or gangrene: Secondary | ICD-10-CM

## 2018-03-21 DIAGNOSIS — Z833 Family history of diabetes mellitus: Secondary | ICD-10-CM

## 2018-03-21 DIAGNOSIS — Z823 Family history of stroke: Secondary | ICD-10-CM | POA: Diagnosis not present

## 2018-03-21 DIAGNOSIS — Z888 Allergy status to other drugs, medicaments and biological substances status: Secondary | ICD-10-CM | POA: Diagnosis not present

## 2018-03-21 DIAGNOSIS — Z90711 Acquired absence of uterus with remaining cervical stump: Secondary | ICD-10-CM

## 2018-03-21 DIAGNOSIS — C562 Malignant neoplasm of left ovary: Secondary | ICD-10-CM

## 2018-03-21 DIAGNOSIS — Z801 Family history of malignant neoplasm of trachea, bronchus and lung: Secondary | ICD-10-CM

## 2018-03-21 DIAGNOSIS — Z79899 Other long term (current) drug therapy: Secondary | ICD-10-CM

## 2018-03-21 DIAGNOSIS — N819 Female genital prolapse, unspecified: Secondary | ICD-10-CM | POA: Diagnosis present

## 2018-03-21 DIAGNOSIS — K623 Rectal prolapse: Secondary | ICD-10-CM | POA: Diagnosis present

## 2018-03-21 DIAGNOSIS — C7982 Secondary malignant neoplasm of genital organs: Secondary | ICD-10-CM | POA: Diagnosis present

## 2018-03-21 DIAGNOSIS — Z885 Allergy status to narcotic agent status: Secondary | ICD-10-CM | POA: Diagnosis not present

## 2018-03-21 DIAGNOSIS — C800 Disseminated malignant neoplasm, unspecified: Secondary | ICD-10-CM | POA: Diagnosis present

## 2018-03-21 DIAGNOSIS — K668 Other specified disorders of peritoneum: Secondary | ICD-10-CM | POA: Diagnosis present

## 2018-03-21 DIAGNOSIS — Z807 Family history of other malignant neoplasms of lymphoid, hematopoietic and related tissues: Secondary | ICD-10-CM

## 2018-03-21 DIAGNOSIS — C569 Malignant neoplasm of unspecified ovary: Secondary | ICD-10-CM | POA: Diagnosis present

## 2018-03-21 DIAGNOSIS — Z91041 Radiographic dye allergy status: Secondary | ICD-10-CM

## 2018-03-21 DIAGNOSIS — K432 Incisional hernia without obstruction or gangrene: Secondary | ICD-10-CM | POA: Diagnosis present

## 2018-03-21 HISTORY — PX: LAPAROSCOPIC SALPINGO OOPHERECTOMY: SHX5927

## 2018-03-21 HISTORY — PX: LAPAROTOMY: SHX154

## 2018-03-21 HISTORY — PX: OMENTECTOMY: SHX5985

## 2018-03-21 HISTORY — PX: DEBULKING: SHX6277

## 2018-03-21 LAB — TYPE AND SCREEN
ABO/RH(D): A POS
Antibody Screen: NEGATIVE

## 2018-03-21 SURGERY — LAPAROTOMY, EXPLORATORY
Anesthesia: General

## 2018-03-21 MED ORDER — HYDROMORPHONE HCL 2 MG PO TABS
2.0000 mg | ORAL_TABLET | Freq: Four times a day (QID) | ORAL | Status: DC | PRN
  Administered 2018-03-21: 2 mg via ORAL
  Administered 2018-03-22: 4 mg via ORAL
  Filled 2018-03-21: qty 2
  Filled 2018-03-21: qty 1

## 2018-03-21 MED ORDER — ENOXAPARIN SODIUM 40 MG/0.4ML ~~LOC~~ SOLN
40.0000 mg | SUBCUTANEOUS | Status: AC
  Administered 2018-03-21: 40 mg via SUBCUTANEOUS
  Filled 2018-03-21: qty 0.4

## 2018-03-21 MED ORDER — HYDROMORPHONE HCL 1 MG/ML IJ SOLN
0.5000 mg | INTRAMUSCULAR | Status: DC | PRN
  Administered 2018-03-21 – 2018-03-22 (×2): 0.5 mg via INTRAVENOUS
  Filled 2018-03-21 (×3): qty 0.5

## 2018-03-21 MED ORDER — OXYCODONE HCL 5 MG PO TABS: 5.0000 mg | ORAL_TABLET | Freq: Once | ORAL | Status: DC | PRN

## 2018-03-21 MED ORDER — DEXAMETHASONE SODIUM PHOSPHATE 10 MG/ML IJ SOLN
INTRAMUSCULAR | Status: AC
  Filled 2018-03-21: qty 1

## 2018-03-21 MED ORDER — ONDANSETRON HCL 4 MG/2ML IJ SOLN
INTRAMUSCULAR | Status: DC | PRN
  Administered 2018-03-21: 4 mg via INTRAVENOUS

## 2018-03-21 MED ORDER — LACTATED RINGERS IV SOLN
INTRAVENOUS | Status: DC
  Administered 2018-03-21 (×2): via INTRAVENOUS

## 2018-03-21 MED ORDER — SODIUM CHLORIDE 0.9 % IV SOLN: 2.0000 g | INTRAVENOUS | Status: DC

## 2018-03-21 MED ORDER — SODIUM CHLORIDE 0.9 % IV SOLN
INTRAVENOUS | Status: DC | PRN
  Administered 2018-03-21: 25 ug/min via INTRAVENOUS

## 2018-03-21 MED ORDER — LIP MEDEX EX OINT
TOPICAL_OINTMENT | CUTANEOUS | Status: AC
  Filled 2018-03-21: qty 7

## 2018-03-21 MED ORDER — ENSURE ENLIVE PO LIQD
237.0000 mL | Freq: Two times a day (BID) | ORAL | Status: DC
  Administered 2018-03-22 – 2018-03-23 (×2): 237 mL via ORAL

## 2018-03-21 MED ORDER — SODIUM CHLORIDE (PF) 0.9 % IJ SOLN
INTRAMUSCULAR | Status: AC
  Filled 2018-03-21: qty 50

## 2018-03-21 MED ORDER — SENNOSIDES-DOCUSATE SODIUM 8.6-50 MG PO TABS
2.0000 | ORAL_TABLET | Freq: Every day | ORAL | Status: DC
  Administered 2018-03-21 – 2018-03-22 (×2): 2 via ORAL
  Filled 2018-03-21 (×2): qty 2

## 2018-03-21 MED ORDER — FENTANYL CITRATE (PF) 250 MCG/5ML IJ SOLN
INTRAMUSCULAR | Status: AC
  Filled 2018-03-21: qty 5

## 2018-03-21 MED ORDER — ENOXAPARIN SODIUM 40 MG/0.4ML ~~LOC~~ SOLN
40.0000 mg | SUBCUTANEOUS | Status: DC
  Administered 2018-03-22 – 2018-03-23 (×2): 40 mg via SUBCUTANEOUS
  Filled 2018-03-21 (×2): qty 0.4

## 2018-03-21 MED ORDER — HYDROMORPHONE HCL 1 MG/ML IJ SOLN
INTRAMUSCULAR | Status: AC
  Filled 2018-03-21: qty 2

## 2018-03-21 MED ORDER — 0.9 % SODIUM CHLORIDE (POUR BTL) OPTIME
TOPICAL | Status: DC | PRN
  Administered 2018-03-21: 2000 mL

## 2018-03-21 MED ORDER — PROPOFOL 10 MG/ML IV BOLUS
INTRAVENOUS | Status: DC | PRN
  Administered 2018-03-21: 70 mg via INTRAVENOUS

## 2018-03-21 MED ORDER — LIDOCAINE 2% (20 MG/ML) 5 ML SYRINGE
INTRAMUSCULAR | Status: AC
  Filled 2018-03-21: qty 5

## 2018-03-21 MED ORDER — ROCURONIUM BROMIDE 100 MG/10ML IV SOLN
INTRAVENOUS | Status: AC
  Filled 2018-03-21: qty 1

## 2018-03-21 MED ORDER — ROCURONIUM BROMIDE 10 MG/ML (PF) SYRINGE
PREFILLED_SYRINGE | INTRAVENOUS | Status: DC | PRN
  Administered 2018-03-21: 10 mg via INTRAVENOUS
  Administered 2018-03-21: 50 mg via INTRAVENOUS

## 2018-03-21 MED ORDER — IBUPROFEN 200 MG PO TABS
600.0000 mg | ORAL_TABLET | Freq: Four times a day (QID) | ORAL | Status: DC
  Administered 2018-03-22 – 2018-03-23 (×5): 600 mg via ORAL
  Filled 2018-03-21 (×5): qty 3

## 2018-03-21 MED ORDER — ACETAMINOPHEN 500 MG PO TABS
1000.0000 mg | ORAL_TABLET | ORAL | Status: AC
  Administered 2018-03-21: 1000 mg via ORAL
  Filled 2018-03-21: qty 2

## 2018-03-21 MED ORDER — LIDOCAINE 2% (20 MG/ML) 5 ML SYRINGE
INTRAMUSCULAR | Status: DC | PRN
  Administered 2018-03-21: 60 mg via INTRAVENOUS

## 2018-03-21 MED ORDER — DEXAMETHASONE SODIUM PHOSPHATE 10 MG/ML IJ SOLN
INTRAMUSCULAR | Status: DC | PRN
  Administered 2018-03-21: 10 mg via INTRAVENOUS

## 2018-03-21 MED ORDER — BUPIVACAINE HCL (PF) 0.25 % IJ SOLN
INTRAMUSCULAR | Status: AC
  Filled 2018-03-21: qty 30

## 2018-03-21 MED ORDER — PROMETHAZINE HCL 25 MG/ML IJ SOLN: 6.2500 mg | INTRAMUSCULAR | Status: DC | PRN

## 2018-03-21 MED ORDER — KCL IN DEXTROSE-NACL 20-5-0.45 MEQ/L-%-% IV SOLN
INTRAVENOUS | Status: DC
  Administered 2018-03-21 – 2018-03-22 (×4): via INTRAVENOUS
  Filled 2018-03-21 (×4): qty 1000

## 2018-03-21 MED ORDER — KETAMINE HCL 10 MG/ML IJ SOLN
INTRAMUSCULAR | Status: DC | PRN
  Administered 2018-03-21: 30 mg via INTRAVENOUS

## 2018-03-21 MED ORDER — BUPIVACAINE HCL 0.25 % IJ SOLN
INTRAMUSCULAR | Status: DC | PRN
  Administered 2018-03-21: 20 mL

## 2018-03-21 MED ORDER — BUPIVACAINE LIPOSOME 1.3 % IJ SUSP
20.0000 mL | Freq: Once | INTRAMUSCULAR | Status: AC
  Administered 2018-03-21: 20 mL
  Filled 2018-03-21: qty 20

## 2018-03-21 MED ORDER — SODIUM CHLORIDE (PF) 0.9 % IJ SOLN
INTRAMUSCULAR | Status: DC | PRN
  Administered 2018-03-21: 40 mL

## 2018-03-21 MED ORDER — LIDOCAINE 2% (20 MG/ML) 5 ML SYRINGE
INTRAMUSCULAR | Status: DC | PRN
  Administered 2018-03-21: 1.5 mg/kg/h via INTRAVENOUS

## 2018-03-21 MED ORDER — FENTANYL CITRATE (PF) 250 MCG/5ML IJ SOLN
INTRAMUSCULAR | Status: DC | PRN
  Administered 2018-03-21: 50 ug via INTRAVENOUS
  Administered 2018-03-21: 25 ug via INTRAVENOUS
  Administered 2018-03-21: 50 ug via INTRAVENOUS
  Administered 2018-03-21: 25 ug via INTRAVENOUS
  Administered 2018-03-21 (×2): 50 ug via INTRAVENOUS

## 2018-03-21 MED ORDER — ONDANSETRON HCL 4 MG PO TABS
4.0000 mg | ORAL_TABLET | Freq: Four times a day (QID) | ORAL | Status: DC | PRN
  Administered 2018-03-23: 4 mg via ORAL
  Filled 2018-03-21: qty 1

## 2018-03-21 MED ORDER — SUGAMMADEX SODIUM 200 MG/2ML IV SOLN
INTRAVENOUS | Status: DC | PRN
  Administered 2018-03-21: 300 mg via INTRAVENOUS

## 2018-03-21 MED ORDER — HYDROMORPHONE HCL 1 MG/ML IJ SOLN
0.2500 mg | INTRAMUSCULAR | Status: DC | PRN
  Administered 2018-03-21 (×4): 0.5 mg via INTRAVENOUS

## 2018-03-21 MED ORDER — PREGABALIN 25 MG PO CAPS
25.0000 mg | ORAL_CAPSULE | Freq: Two times a day (BID) | ORAL | Status: DC
  Administered 2018-03-22: 25 mg via ORAL
  Filled 2018-03-21: qty 1

## 2018-03-21 MED ORDER — NON FORMULARY: 1.0000 [IU] | Freq: Three times a day (TID) | Status: DC

## 2018-03-21 MED ORDER — SODIUM CHLORIDE 0.9 % IV SOLN
2.0000 g | Freq: Once | INTRAVENOUS | Status: AC
  Administered 2018-03-21: 2 g via INTRAVENOUS
  Filled 2018-03-21: qty 2

## 2018-03-21 MED ORDER — GABAPENTIN 300 MG PO CAPS
300.0000 mg | ORAL_CAPSULE | ORAL | Status: AC
  Administered 2018-03-21: 300 mg via ORAL
  Filled 2018-03-21: qty 1

## 2018-03-21 MED ORDER — SODIUM CHLORIDE (PF) 0.9 % IJ SOLN
INTRAMUSCULAR | Status: AC
  Filled 2018-03-21: qty 20

## 2018-03-21 MED ORDER — ONDANSETRON HCL 4 MG/2ML IJ SOLN
INTRAMUSCULAR | Status: AC
  Filled 2018-03-21: qty 2

## 2018-03-21 MED ORDER — SUGAMMADEX SODIUM 200 MG/2ML IV SOLN
INTRAVENOUS | Status: AC
  Filled 2018-03-21: qty 4

## 2018-03-21 MED ORDER — PHENYLEPHRINE HCL 10 MG/ML IJ SOLN
INTRAMUSCULAR | Status: AC
  Filled 2018-03-21: qty 1

## 2018-03-21 MED ORDER — CHEWING GUM (ORBIT) SUGAR FREE
1.0000 | CHEWING_GUM | Freq: Three times a day (TID) | ORAL | Status: DC
  Administered 2018-03-21 – 2018-03-23 (×6): 1 via ORAL
  Filled 2018-03-21: qty 1

## 2018-03-21 MED ORDER — OXYCODONE HCL 5 MG/5ML PO SOLN
5.0000 mg | Freq: Once | ORAL | Status: DC | PRN
  Filled 2018-03-21: qty 5

## 2018-03-21 MED ORDER — DEXAMETHASONE SODIUM PHOSPHATE 4 MG/ML IJ SOLN: 4.0000 mg | INTRAMUSCULAR | Status: DC

## 2018-03-21 MED ORDER — SCOPOLAMINE 1 MG/3DAYS TD PT72: 1.0000 | MEDICATED_PATCH | TRANSDERMAL | Status: DC

## 2018-03-21 MED ORDER — ONDANSETRON HCL 4 MG/2ML IJ SOLN: 4.0000 mg | Freq: Four times a day (QID) | INTRAMUSCULAR | Status: DC | PRN

## 2018-03-21 MED ORDER — PROPOFOL 10 MG/ML IV BOLUS
INTRAVENOUS | Status: AC
  Filled 2018-03-21: qty 20

## 2018-03-21 MED ORDER — ACETAMINOPHEN 500 MG PO TABS
1000.0000 mg | ORAL_TABLET | Freq: Two times a day (BID) | ORAL | Status: DC
  Administered 2018-03-22 – 2018-03-23 (×3): 1000 mg via ORAL
  Filled 2018-03-21 (×3): qty 2

## 2018-03-21 SURGICAL SUPPLY — 84 items
ATTRACTOMAT 16X20 MAGNETIC DRP (DRAPES) ×3 IMPLANT
BINDER ABDOMINAL 12 ML 46-62 (SOFTGOODS) ×3 IMPLANT
BLADE EXTENDED COATED 6.5IN (ELECTRODE) ×3 IMPLANT
CABLE HIGH FREQUENCY MONO STRZ (ELECTRODE) IMPLANT
CELLS DAT CNTRL 66122 CELL SVR (MISCELLANEOUS) IMPLANT
CHLORAPREP W/TINT 26ML (MISCELLANEOUS) ×3 IMPLANT
CLIP VESOCCLUDE LG 6/CT (CLIP) ×3 IMPLANT
CLIP VESOCCLUDE MED 6/CT (CLIP) ×3 IMPLANT
CLIP VESOCCLUDE MED LG 6/CT (CLIP) ×3 IMPLANT
CONT SPEC 4OZ CLIKSEAL STRL BL (MISCELLANEOUS) IMPLANT
COVER SURGICAL LIGHT HANDLE (MISCELLANEOUS) ×3 IMPLANT
COVER WAND RF STERILE (DRAPES) IMPLANT
DERMABOND ADVANCED (GAUZE/BANDAGES/DRESSINGS) ×1
DERMABOND ADVANCED .7 DNX12 (GAUZE/BANDAGES/DRESSINGS) ×2 IMPLANT
DRAPE INCISE IOBAN 66X45 STRL (DRAPES) ×3 IMPLANT
DRAPE WARM FLUID 44X44 (DRAPE) ×3 IMPLANT
DRSG OPSITE POSTOP 4X10 (GAUZE/BANDAGES/DRESSINGS) ×3 IMPLANT
DRSG OPSITE POSTOP 4X6 (GAUZE/BANDAGES/DRESSINGS) ×3 IMPLANT
DRSG OPSITE POSTOP 4X8 (GAUZE/BANDAGES/DRESSINGS) IMPLANT
ELECT PENCIL ROCKER SW 15FT (MISCELLANEOUS) ×3 IMPLANT
ELECT REM PT RETURN 15FT ADLT (MISCELLANEOUS) ×3 IMPLANT
GAUZE 4X4 16PLY RFD (DISPOSABLE) ×3 IMPLANT
GLOVE BIO SURGEON STRL SZ 6 (GLOVE) ×9 IMPLANT
GLOVE BIO SURGEON STRL SZ 6.5 (GLOVE) ×6 IMPLANT
GOWN STRL NON-REIN LRG LVL3 (GOWN DISPOSABLE) IMPLANT
GOWN STRL REUS W/ TWL LRG LVL3 (GOWN DISPOSABLE) ×4 IMPLANT
GOWN STRL REUS W/TWL LRG LVL3 (GOWN DISPOSABLE) ×2
HEMOSTAT ARISTA ABSORB 3G PWDR (MISCELLANEOUS) IMPLANT
HOLDER FOLEY CATH W/STRAP (MISCELLANEOUS) ×3 IMPLANT
IRRIG SUCT STRYKERFLOW 2 WTIP (MISCELLANEOUS)
IRRIGATION SUCT STRKRFLW 2 WTP (MISCELLANEOUS) IMPLANT
KIT BASIN OR (CUSTOM PROCEDURE TRAY) ×3 IMPLANT
LIGASURE IMPACT 36 18CM CVD LR (INSTRUMENTS) ×3 IMPLANT
LOOP VESSEL MAXI BLUE (MISCELLANEOUS) IMPLANT
MANIPULATOR UTERINE 4.5 ZUMI (MISCELLANEOUS) IMPLANT
MESH PROLENE 3X6 (Mesh General) ×3 IMPLANT
NEEDLE HYPO 22GX1.5 SAFETY (NEEDLE) ×6 IMPLANT
NS IRRIG 1000ML POUR BTL (IV SOLUTION) ×6 IMPLANT
PACK GENERAL/GYN (CUSTOM PROCEDURE TRAY) IMPLANT
PAD POSITIONING PINK XL (MISCELLANEOUS) IMPLANT
POUCH SPECIMEN RETRIEVAL 10MM (ENDOMECHANICALS) IMPLANT
PROTECTOR NERVE ULNAR (MISCELLANEOUS) IMPLANT
RELOAD PROXIMATE 75MM BLUE (ENDOMECHANICALS) IMPLANT
RELOAD PROXIMATE TA60MM BLUE (ENDOMECHANICALS) IMPLANT
RETRACTOR WND ALEXIS 25 LRG (MISCELLANEOUS) IMPLANT
RTRCTR WOUND ALEXIS 18CM MED (MISCELLANEOUS)
RTRCTR WOUND ALEXIS 25CM LRG (MISCELLANEOUS)
SCISSORS LAP 5X35 DISP (ENDOMECHANICALS) IMPLANT
SHEET LAVH (DRAPES) ×3 IMPLANT
SPOGE SURGIFLO 8M (HEMOSTASIS)
SPONGE LAP 18X18 RF (DISPOSABLE) ×12 IMPLANT
SPONGE SURGIFLO 8M (HEMOSTASIS) IMPLANT
STAPLER GUN LINEAR PROX 60 (STAPLE) IMPLANT
STAPLER PROXIMATE 75MM BLUE (STAPLE) IMPLANT
STAPLER VISISTAT 35W (STAPLE) IMPLANT
SUT MNCRL AB 4-0 PS2 18 (SUTURE) ×9 IMPLANT
SUT NOVA NAB GS-21 0 18 T12 DT (SUTURE) ×6 IMPLANT
SUT PDS AB 1 TP1 96 (SUTURE) ×6 IMPLANT
SUT SILK 3 0 SH CR/8 (SUTURE) IMPLANT
SUT VIC AB 0 CT1 36 (SUTURE) ×3 IMPLANT
SUT VIC AB 2-0 CT1 36 (SUTURE) ×6 IMPLANT
SUT VIC AB 2-0 CT2 27 (SUTURE) ×6 IMPLANT
SUT VIC AB 2-0 SH 27 (SUTURE)
SUT VIC AB 2-0 SH 27X BRD (SUTURE) IMPLANT
SUT VIC AB 3-0 CTX 36 (SUTURE) ×9 IMPLANT
SUT VIC AB 3-0 PS2 18 (SUTURE)
SUT VIC AB 3-0 PS2 18XBRD (SUTURE) IMPLANT
SUT VIC AB 3-0 SH 18 (SUTURE) IMPLANT
SUT VIC AB 3-0 SH 27 (SUTURE) ×7
SUT VIC AB 3-0 SH 27X BRD (SUTURE) ×10 IMPLANT
SUT VIC AB 3-0 SH 27XBRD (SUTURE) ×4 IMPLANT
SYR 30ML LL (SYRINGE) ×6 IMPLANT
SYR BULB IRRIGATION 50ML (SYRINGE) ×3 IMPLANT
TAPE CLOTH 4X10 WHT NS (GAUZE/BANDAGES/DRESSINGS) IMPLANT
TOWEL OR 17X26 10 PK STRL BLUE (TOWEL DISPOSABLE) ×3 IMPLANT
TOWEL OR NON WOVEN STRL DISP B (DISPOSABLE) ×3 IMPLANT
TRAY FOLEY MTR SLVR 16FR STAT (SET/KITS/TRAYS/PACK) ×3 IMPLANT
TRAY LAPAROSCOPIC (CUSTOM PROCEDURE TRAY) ×3 IMPLANT
TROCAR XCEL 12X100 BLDLESS (ENDOMECHANICALS) IMPLANT
TROCAR XCEL BLUNT TIP 100MML (ENDOMECHANICALS) IMPLANT
TUBING INSUF HEATED (TUBING) IMPLANT
TUBING NON-CON 1/4 X 20 CONN (TUBING) IMPLANT
UNDERPAD 30X30 (UNDERPADS AND DIAPERS) ×3 IMPLANT
YANKAUER SUCT BULB TIP 10FT TU (MISCELLANEOUS) ×3 IMPLANT

## 2018-03-21 NOTE — Op Note (Signed)
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   Surgeon: Emma C Rossi, MD.  Assistant Surgeon: Lisa Jackson-Moore, M.D. Assistant: (an MD assistant was necessary for tissue manipulation, retraction and positioning due to the complexity of the case and hospital policies).   Specimens: Bilateral tubes / ovaries, omentum. Midline abdominal wall mass, left lateral abdominal wall mass.    Estimated Blood Loss: 100 mL.    Urine Output: 30cc  Complications: None.   Operative Findings:  Abdominal wall masses consistent with port site metastases at the umbilical incision (7cm) and left lateral incision (5cm). Omental caking in the infracolic omentum. Grossly normal very small tubes and ovaries. Normal diaphragms. No lymphadenopathy. Normal small and large intestine with the exception of miliary studding of tumor on the surface of the sigmoid colon and rectum in the cul de sac.    This represented an optimal cytoreduction (R1) with no gross visible disease remaining, however there was a thin rind of tumor on the lateral left fascia associated with the port site metastasis.   Procedure:   The patient was seen in the Holding Room. The risks, benefits, complications, treatment options, and expected outcomes were discussed with the patient.  The patient concurred with the proposed plan, giving informed consent.   The patient was  identified as Misty Hopkins  and the procedure verified as BSO, omentectomy, tumor debulking. A Time Out was held and the above information confirmed upon entry to the operating room.  After induction of anesthesia, the patient was draped and prepped in the usual sterile manner.  She was prepped and draped in the normal sterile fashion in  the dorsal lithotomy position in padded Allen stirrups with good attention paid to support of the lower back and lower extremities. Position was adjusted for appropriate support. A Foley catheter was placed to gravity.   A midline vertical incision was made and carried through the subcutaneous tissue to the fascia. The fascial incision was made and extended superiorally. The rectus muscles were separated. The peritoneum was identified and entered. Peritoneal incision was extended longitudinally.  The abdominal cavity was entered sharply and without incident.   The umbilical port site metastasis was carefully resected from the anterior abdominal wall and involved the fascia which was resected with it.   A Bookwalter retractor was then placed. A survey of the abdomen and pelvis revealed the above findings, which were significant for omental caking, normal ovaries and miliary studding on the rectum/sigmoid.  The omental cake was dissected free from the transverse colon from the hepatic flexure to the splenic flexure using sharp metzenbaum scissor dissection. The lesser sac was entered. The tumor cake was separated from the mesentery of the transverse colon. The short gastric vessels were sealed with ligasure and the infragastric omentum was separated from the greater curvature of the stomach removing all bulky tumor. Hemostasis was confirmed. The inferior aspect of infracolic omental cake was adherent to the anterior sigmoid colon in the pelvis. This was carefully dissected with sharp dissection. The anterior sigmoid bowel wall was reinforced with imbricating sutures at the site of separate from the omentum. The colon was closely inspected and was noted to be intact and hemostatic.   After packing the small bowel into the upper abdomen, we performed the right salpingo-oophorectomy by entering the  pelvic sidewall just posterior   to the right round ligament. The pararectal space was developed and the  retroperitoneum developed up to the level of the common iliac artery.  The course of the ureter was identified with ease. The right IP was then skeletonized, and sealed and transected with the ligasure. The ovary was separated from its peritoneal attachments with the bovie with visualization of the ureter at all times. The ovary was separated from the vaginal cuff/cardinal ligament attatchments using the ligasure.   The sigmoid colon was dissected from its dense tumor attachments to the left ovary using sharp dissection. The left retroperitoneal peritoneum was entered parallel to the sigmoid colon attachments and the left ureter was identified in the left retroperitoneal space. Using sharp and monopolar dissection, the left tube and ovary were freed from their peritoneal adhesions to the pelvis and sigmoid colon. The IP ligament was sealed with ligasure. The vaginal cuff/cardinal ligament attachments were taken down with ligasure.   The peritoneal cavity was irrigated and hemostasis was confirmed at all surgical sites.  The left lateral abdominal port site was then re-opened and extended. The bovie was used to excise the left lateral port site met. There was a thing firmness to the left lateral inferior transversalis fascia consistent with a residual tumor rind. This extended to the iliac crest and would have involved a radical resection of the abdominal wall to resect. Given that it was thin and not bulky >1cm3, it was left in situ. The remainder of the port site metastasis was completely resected.   Residual tumor was present at miliary studding on the pelvic pertoneal surfaces and the transversalis fascia in the left lateral abdomen.   The fascia was reapproximated with 0 looped PDS using a total of two sutures. The subcutaneous layer was then irrigated copiously.   There was a 3x3cm fascial defect in the left anterior abdominal wall underlying the umbilicus (from resection of the tumor). This was  closed using a 4x6cm piece of prolene mesh and neuralon sutures to tack it to the surrounding fascial edges. The subcutaneous fat was closed at the left lower quadrant incision and the midline incision with vicryl. Exparel long acting local anesthetic was infiltrated into the subcutaneous tissues. The skin was closed with subcuticular suture. The patient tolerated the procedure well.   Sponge, lap and needle counts were correct x 2.  Emma C Rossi, MD  

## 2018-03-21 NOTE — Anesthesia Preprocedure Evaluation (Signed)
Anesthesia Evaluation  Patient identified by MRN, date of birth, ID band Patient awake    Reviewed: Allergy & Precautions, NPO status , Patient's Chart, lab work & pertinent test results  History of Anesthesia Complications Negative for: history of anesthetic complications  Airway Mallampati: II  TM Distance: >3 FB Neck ROM: Full    Dental no notable dental hx.    Pulmonary neg pulmonary ROS,    Pulmonary exam normal breath sounds clear to auscultation       Cardiovascular hypertension, Pt. on medications Normal cardiovascular exam Rhythm:Regular Rate:Normal     Neuro/Psych negative neurological ROS  negative psych ROS   GI/Hepatic Neg liver ROS, GERD  ,  Endo/Other  negative endocrine ROS  Renal/GU negative Renal ROS  negative genitourinary   Musculoskeletal negative musculoskeletal ROS (+)   Abdominal   Peds  Hematology negative hematology ROS (+)   Anesthesia Other Findings GYN Cancer  Reproductive/Obstetrics                             Anesthesia Physical  Anesthesia Plan  ASA: III  Anesthesia Plan: General   Post-op Pain Management:    Induction: Intravenous  PONV Risk Score and Plan: 3 and Ondansetron, Dexamethasone, Treatment may vary due to age or medical condition and Midazolam  Airway Management Planned: Oral ETT  Additional Equipment: None  Intra-op Plan:   Post-operative Plan: Extubation in OR  Informed Consent: I have reviewed the patients History and Physical, chart, labs and discussed the procedure including the risks, benefits and alternatives for the proposed anesthesia with the patient or authorized representative who has indicated his/her understanding and acceptance.   Dental advisory given  Plan Discussed with:   Anesthesia Plan Comments:         Anesthesia Quick Evaluation

## 2018-03-21 NOTE — Progress Notes (Signed)
Called MD and permission was given for pt to eat now as she is not having nausea and vomiting.

## 2018-03-21 NOTE — Transfer of Care (Signed)
Immediate Anesthesia Transfer of Care Note  Patient: Misty Hopkins  Procedure(s) Performed: EXPLORATORY LAPAROTOMY, INCISIONAL HERNIA REPAIR (N/A ) SALPINGO OOPHORECTOMY (Bilateral ) OMENTECTOMY (N/A ) RADICAL TUMOR DEBULKING (N/A )  Patient Location: PACU  Anesthesia Type:General  Level of Consciousness: awake, alert , oriented and patient cooperative  Airway & Oxygen Therapy: Patient Spontanous Breathing and Patient connected to face mask oxygen  Post-op Assessment: Report given to RN, Post -op Vital signs reviewed and stable and Patient moving all extremities  Post vital signs: Reviewed and stable  Last Vitals:  Vitals Value Taken Time  BP 148/88 03/21/2018  2:15 PM  Temp    Pulse 81 03/21/2018  2:18 PM  Resp 15 03/21/2018  2:18 PM  SpO2 100 % 03/21/2018  2:18 PM  Vitals shown include unvalidated device data.  Last Pain:  Vitals:   03/21/18 0812  TempSrc: Oral      Patients Stated Pain Goal: 4 (76/16/07 3710)  Complications: No apparent anesthesia complications

## 2018-03-21 NOTE — Interval H&P Note (Signed)
History and Physical Interval Note:  03/21/2018 10:37 AM  Misty Hopkins  has presented today for surgery, with the diagnosis of OVARIAN CANCER  The various methods of treatment have been discussed with the patient and family. After consideration of risks, benefits and other options for treatment, the patient has consented to  Procedure(s): EXPLORATORY LAPAROTOMY,POSSIBLE RECTOSIGMOID RESECTION (N/A) LAPAROSCOPIC SALPINGO OOPHORECTOMY (N/A) OMENTECTOMY (N/A) DEBULKINGOF TUMOR (N/A) as a surgical intervention .  The patient's history has been reviewed, patient examined, no change in status, stable for surgery.  I have reviewed the patient's chart and labs.    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  Questions were answered to the patient's satisfaction.     Thereasa Solo

## 2018-03-21 NOTE — Anesthesia Procedure Notes (Addendum)
Procedure Name: Intubation Date/Time: 03/21/2018 11:24 AM Performed by: Sharlette Dense, CRNA Patient Re-evaluated:Patient Re-evaluated prior to induction Oxygen Delivery Method: Circle system utilized Preoxygenation: Pre-oxygenation with 100% oxygen Induction Type: IV induction Ventilation: Mask ventilation without difficulty Laryngoscope Size: Miller and 2 Grade View: Grade II Tube type: Oral Tube size: 7.0 mm Number of attempts: 2 Airway Equipment and Method: Stylet Placement Confirmation: ETT inserted through vocal cords under direct vision,  positive ETCO2 and breath sounds checked- equal and bilateral Secured at: 20 cm Tube secured with: Tape Dental Injury: Teeth and Oropharynx as per pre-operative assessment

## 2018-03-22 ENCOUNTER — Encounter (HOSPITAL_COMMUNITY): Payer: Self-pay | Admitting: Gynecologic Oncology

## 2018-03-22 LAB — BASIC METABOLIC PANEL
Anion gap: 4 — ABNORMAL LOW (ref 5–15)
BUN: 13 mg/dL (ref 8–23)
CHLORIDE: 109 mmol/L (ref 98–111)
CO2: 24 mmol/L (ref 22–32)
Calcium: 8.1 mg/dL — ABNORMAL LOW (ref 8.9–10.3)
Creatinine, Ser: 0.57 mg/dL (ref 0.44–1.00)
GFR calc non Af Amer: >60 mL/min (ref >60)
Glucose, Bld: 170 mg/dL — ABNORMAL HIGH (ref 70–99)
POTASSIUM: 4.7 mmol/L (ref 3.5–5.1)
Sodium: 137 mmol/L (ref 135–145)

## 2018-03-22 LAB — CBC
HEMATOCRIT: 36.9 % (ref 36.0–46.0)
HEMOGLOBIN: 11.3 g/dL — AB (ref 12.0–15.0)
MCH: 28.3 pg (ref 26.0–34.0)
MCHC: 30.6 g/dL (ref 30.0–36.0)
MCV: 92.3 fL (ref 80.0–100.0)
Platelets: 221 10*3/uL (ref 150–400)
RBC: 4 MIL/uL (ref 3.87–5.11)
RDW: 13.3 % (ref 11.5–15.5)
WBC: 18.1 10*3/uL — ABNORMAL HIGH (ref 4.0–10.5)
nRBC: 0 % (ref 0.0–0.2)

## 2018-03-22 MED ORDER — ENOXAPARIN (LOVENOX) PATIENT EDUCATION KIT
PACK | Freq: Once | Status: AC
  Administered 2018-03-22: 12:00:00
  Filled 2018-03-22: qty 1

## 2018-03-22 MED ORDER — HYDROMORPHONE HCL 2 MG PO TABS
2.0000 mg | ORAL_TABLET | ORAL | Status: DC | PRN
  Administered 2018-03-23: 2 mg via ORAL
  Administered 2018-03-23: 4 mg via ORAL
  Filled 2018-03-22 (×2): qty 1

## 2018-03-22 NOTE — Progress Notes (Signed)
Patient states she is doing well.  Ambulating without difficulty.  Continuing to tolerate regular food. Continues to report abdominal incision soreness but reports improvement with use of heating pad.  Voiding without difficulty. Having lower abdominal cramping intermittently which she feels is her bowels waking up.  No flatus but belching intermittently. No concerns or needs voiced.

## 2018-03-22 NOTE — Progress Notes (Signed)
1 Day Post-Op Procedure(s) (LRB): EXPLORATORY LAPAROTOMY, INCISIONAL HERNIA REPAIR (N/A) SALPINGO OOPHORECTOMY (Bilateral) OMENTECTOMY (N/A) RADICAL TUMOR DEBULKING (N/A)  Subjective: Patient reports moderate abdominal discomfort.  States the oral pain medication only offers relief for a short period of time.  Tolerated soft food this am with no nausea or emesis.  Ambulating without difficulty. Belching but not passing flatus.  Denies chest pain, dyspnea.  Husband at bedside.  All questions answered. No concerns voiced.  Objective: Vital signs in last 24 hours: Temp:  [97.4 F (36.3 C)-98.4 F (36.9 C)] 97.6 F (36.4 C) (11/27 0904) Pulse Rate:  [60-82] 70 (11/27 0904) Resp:  [13-17] 17 (11/27 0904) BP: (111-148)/(64-88) 113/71 (11/27 0904) SpO2:  [94 %-99 %] 96 % (11/27 0904) Last BM Date: 03/21/18  Intake/Output from previous day: 11/26 0701 - 11/27 0700 In: 3824.8 [P.O.:300; I.V.:3424.8; IV Piggyback:100] Out: 2035 [Urine:1935; Blood:100]  Physical Examination: General: alert, cooperative and no distress Resp: clear to auscultation bilaterally Cardio: regular rate and rhythm, S1, S2 normal, no murmur, click, rub or gallop GI: incision: Midline incision with op site dressing intact with no drainage noted, op site dressing over left lap site intact with no drainage and abdomen soft, non-distended, faint bowel sounds, binder in place Extremities: extremities normal, atraumatic, no cyanosis or edema  Labs: WBC/Hgb/Hct/Plts:  18.1/11.3/36.9/221 (11/27 0444) BUN/Cr/glu/ALT/AST/amyl/lip:  13/0.57/--/--/--/--/-- (11/27 0444)  Assessment: 77 y.o. s/p Procedure(s): EXPLORATORY LAPAROTOMY, INCISIONAL HERNIA REPAIR SALPINGO OOPHORECTOMY OMENTECTOMY RADICAL TUMOR DEBULKING: stable Pain:  Pain is slightly well-controlled on PRN medications. Plan to adjust frequency and ordered kpad.  Heme: Hgb 11.3 and Hct 36.9 this am.  Appropriate this am.  CV: BP and HR stable. Continue to  monitor with ordered vital signs.  GI:  Tolerating po: Yes. Antiemetics ordered as needed.  GU: Due to void since foley removal. Adequate output recorded.    FEN: No critical values this am.  Prophylaxis: SCDs and lovenox.  Plan: Kpad Adjust Dilaudid PO PRN to Q4H PRN Diet to regular Encourage IS use, deep breathing, and coughing Continue plan of care per Dr. Gerarda Fraction Lovenox teaching kit for home use   LOS: 1 day    Misty Hopkins 03/22/2018, 9:25 AM

## 2018-03-23 LAB — BASIC METABOLIC PANEL
Anion gap: 5 (ref 5–15)
BUN: 15 mg/dL (ref 8–23)
CALCIUM: 8 mg/dL — AB (ref 8.9–10.3)
CO2: 23 mmol/L (ref 22–32)
Chloride: 112 mmol/L — ABNORMAL HIGH (ref 98–111)
Creatinine, Ser: 0.64 mg/dL (ref 0.44–1.00)
GFR calc non Af Amer: >60 mL/min (ref >60)
Glucose, Bld: 101 mg/dL — ABNORMAL HIGH (ref 70–99)
Potassium: 4.5 mmol/L (ref 3.5–5.1)
SODIUM: 140 mmol/L (ref 135–145)

## 2018-03-23 LAB — CBC
HCT: 33.3 % — ABNORMAL LOW (ref 36.0–46.0)
Hemoglobin: 10.2 g/dL — ABNORMAL LOW (ref 12.0–15.0)
MCH: 28.3 pg (ref 26.0–34.0)
MCHC: 30.6 g/dL (ref 30.0–36.0)
MCV: 92.5 fL (ref 80.0–100.0)
NRBC: 0 % (ref 0.0–0.2)
PLATELETS: 186 10*3/uL (ref 150–400)
RBC: 3.6 MIL/uL — ABNORMAL LOW (ref 3.87–5.11)
RDW: 13.6 % (ref 11.5–15.5)
WBC: 14 10*3/uL — AB (ref 4.0–10.5)

## 2018-03-23 MED ORDER — OXYCODONE HCL 5 MG PO TABS: 5.0000 mg | ORAL_TABLET | ORAL | 0 refills | Status: DC | PRN

## 2018-03-23 MED ORDER — OXYCODONE-ACETAMINOPHEN 5-325 MG PO TABS: 1.0000 | ORAL_TABLET | ORAL | 0 refills | Status: DC | PRN

## 2018-03-23 NOTE — Progress Notes (Signed)
Pt was discharged home today. Instructions were reviewed with patient, and questions were answered. Pt was taken to main entrance via wheelchair by NT.  

## 2018-03-23 NOTE — Discharge Summary (Signed)
Physician Discharge Summary  Patient ID: Misty Hopkins MRN: [id number] DOB/AGE: May 15, 1940 77 y.o.  Admit date: 03/21/2018 Discharge date: 03/23/2018  Admission Diagnoses: Ovarian cancer [facility])  Discharge Diagnoses:  Principal Problem:   Ovarian cancer [facility]) Active Problems:   Omental mass   Discharged Condition: good  Hospital Course:  1/ patient was admitted on 03/21/18 for an ex lap, BSO, omentectomy, radical tumor debulking, repair of ventral hernia with mesh for stage III ovarian cancer. 2/ surgery was uncomplicated  3/ on postoperative day 2 the patient was meeting discharge criteria: tolerating PO, voiding urine, ambulating, pain well controlled on oral medications.  4/ new medications on discharge include sennakot, percocet and ibuprofen and lovenox x 28 days (these were prescribed preop).   Consults: None  Significant Diagnostic Studies: labs:  CBC    Component Value Date/Time   WBC 14.0 (H) 03/23/2018 0459   RBC 3.60 (L) 03/23/2018 0459   HGB 10.2 (L) 03/23/2018 0459   HGB 13.1 02/28/2018 1609   HCT 33.3 (L) 03/23/2018 0459   PLT 186 03/23/2018 0459   PLT 208 02/28/2018 1609   MCV 92.5 03/23/2018 0459   MCH 28.3 03/23/2018 0459   MCHC 30.6 03/23/2018 0459   RDW 13.6 03/23/2018 0459   LYMPHSABS 2.7 02/28/2018 1609   MONOABS 0.9 02/28/2018 1609   EOSABS 0.2 02/28/2018 1609   BASOSABS 0.1 02/28/2018 1609     Treatments: surgery: see above  Discharge Exam: Blood pressure 116/74, pulse 64, temperature 97.9 F (36.6 C), temperature source Oral, resp. rate 16, height 5\' 4"  (1.626 m), weight 148 lb 14.4 oz (67.5 kg), SpO2 97 %. General appearance: alert and cooperative GI: soft, non-tender; bowel sounds normal; no masses,  no organomegaly Incision/Wound: in tact with dressing applied.   Disposition: Discharge disposition: 01-Home or Self Care       Discharge Instructions    (HEART FAILURE PATIENTS) Call MD:  Anytime you have any of the  following symptoms: 1) 3 pound weight gain in 24 hours or 5 pounds in 1 week 2) shortness of breath, with or without a dry hacking cough 3) swelling in the hands, feet or stomach 4) if you have to sleep on extra pillows at night in order to breathe.   Complete by:  As directed    Call MD for:  difficulty breathing, headache or visual disturbances   Complete by:  As directed    Call MD for:  extreme fatigue   Complete by:  As directed    Call MD for:  hives   Complete by:  As directed    Call MD for:  persistant dizziness or light-headedness   Complete by:  As directed    Call MD for:  persistant nausea and vomiting   Complete by:  As directed    Call MD for:  redness, tenderness, or signs of infection (pain, swelling, redness, odor or green/yellow discharge around incision site)   Complete by:  As directed    Call MD for:  severe uncontrolled pain   Complete by:  As directed    Call MD for:  temperature >100.4   Complete by:  As directed    Diet - low sodium heart healthy   Complete by:  As directed    Diet general   Complete by:  As directed    Driving Restrictions   Complete by:  As directed    No driving for 7 days or until off narcotic pain medication   Increase activity  slowly   Complete by:  As directed    Remove dressing in 24 hours   Complete by:  As directed    Sexual Activity Restrictions   Complete by:  As directed    No intercourse for 6 weeks     Allergies as of 03/23/2018      Reactions   Ivp Dye [iodinated Diagnostic Agents] Anaphylaxis   Anaphylaxis and hives   Propoxyphene Anaphylaxis, Shortness Of Breath, Swelling   swollen all over   Codeine Nausea Only, Other (See Comments)   Headaches.   Ultram [tramadol Hcl] Hives      Medication List    TAKE these medications   Biotin 5000 MCG Tabs Take 5,000 mcg by mouth daily.   carboxymethylcellulose 0.5 % Soln Commonly known as:  REFRESH PLUS Place 1 drop into both eyes 3 (three) times daily as needed  (for dry eyes.).   cephALEXin 250 MG capsule Commonly known as:  KEFLEX Take 1 capsule (250 mg total) by mouth 4 (four) times daily.   clindamycin 1 % gel Commonly known as:  CLINDAGEL Apply 1 application topically 2 (two) times daily.   enoxaparin 40 MG/0.4ML injection Commonly known as:  LOVENOX Inject 0.4 mLs (40 mg total) into the skin daily. To begin after surgery   ibuprofen 600 MG tablet Commonly known as:  ADVIL,MOTRIN Take 1 tablet (600 mg total) by mouth every 6 (six) hours as needed.   oxyCODONE 5 MG immediate release tablet Commonly known as:  Oxy IR/ROXICODONE Take 1 tablet (5 mg total) by mouth every 4 (four) hours as needed for severe pain.   oxyCODONE-acetaminophen 5-325 MG tablet Commonly known as:  PERCOCET/ROXICET Take 1-2 tablets by mouth every 4 (four) hours as needed for severe pain.   senna-docusate 8.6-50 MG tablet Commonly known as:  Senokot-S Take 2 tablets by mouth at bedtime. Hold if having loose stools   traZODone 50 MG tablet Commonly known as:  DESYREL Take 50 mg by mouth at bedtime as needed for sleep.   triamcinolone cream 0.5 % Commonly known as:  KENALOG Apply 1 application topically 2 (two) times daily. Apply to eyelids/eyebrows   VITAMIN D3 SUPER STRENGTH 50 MCG (2000 UT) Tabs Generic drug:  Cholecalciferol Take 2,000 Units by mouth daily.        Signed: Thereasa Solo 03/23/2018, 9:10 AM

## 2018-03-23 NOTE — Discharge Instructions (Signed)
03/21/2018  Return to work: 4 weeks  Activity: 1. Be up and out of the bed during the day.  Take a nap if needed.  You may walk up steps but be careful and use the hand rail.  Stair climbing will tire you more than you think, you may need to stop part way and rest.   2. No lifting or straining for 6 weeks.  3. No driving for 1 weeks.  Do Not drive if you are taking narcotic pain medicine.  4. Shower daily.  Use soap and water on your incision and pat dry; don't rub.   5. No sexual activity and nothing in the vagina for 2 weeks.  Medications:  - Take ibuprofen and tylenol first line for pain control. Take these regularly (every 6 hours) to decrease the build up of pain.  - If necessary, for severe pain not relieved by ibuprofen, take percocet.  - While taking percocet you should take sennakot every night to reduce the likelihood of constipation. If this causes diarrhea, stop its use.  - Dr Denman George has prescribed lovenox shots for you to take once per day for 1 month after your surgery. \  Diet: 1. Low sodium Heart Healthy Diet is recommended.  2. It is safe to use a laxative if you have difficulty moving your bowels.   Wound Care: 1. Keep clean and dry.  Shower daily. 2. You can get the dressing wet in the shower, but avoid tub baths. 3. Remove the dressing between 5 and 7 days after your surgery (1 week after your operation). 4. After the dressing is removed you can get the incision wet in the shower, however continue to avoid tub baths until advised otherwise by your surgeon at follow-up.   Reasons to call the Doctor:   Fever - Oral temperature greater than 100.4 degrees Fahrenheit  Foul-smelling vaginal discharge  Difficulty urinating  Nausea and vomiting  Increased pain at the site of the incision that is unrelieved with pain medicine.  Difficulty breathing with or without chest pain  New calf pain especially if only on one side  Sudden, continuing increased  vaginal bleeding with or without clots.   Follow-up: 1. See Everitt Amber in 2 weeks.  Contacts: For questions or concerns you should contact:  Dr. Everitt Amber at [phone] After hours and on week-ends call [phone] and ask to speak to the physician on call for Gynecologic Oncology

## 2018-03-24 NOTE — Anesthesia Postprocedure Evaluation (Signed)
Anesthesia Post Note  Patient: Misty Hopkins  Procedure(s) Performed: EXPLORATORY LAPAROTOMY, INCISIONAL HERNIA REPAIR (N/A ) SALPINGO OOPHORECTOMY (Bilateral ) OMENTECTOMY (N/A ) RADICAL TUMOR DEBULKING (N/A )     Patient location during evaluation: PACU Anesthesia Type: General Level of consciousness: awake and alert Pain management: pain level controlled Vital Signs Assessment: post-procedure vital signs reviewed and stable Respiratory status: spontaneous breathing, nonlabored ventilation and respiratory function stable Cardiovascular status: blood pressure returned to baseline and stable Postop Assessment: no apparent nausea or vomiting Anesthetic complications: no    Last Vitals:  Vitals:   03/22/18 2129 03/23/18 0554  BP: (!) 141/72 116/74  Pulse: 70 64  Resp: 20 16  Temp: 37 C 36.6 C  SpO2: 96% 97%    Last Pain:  Vitals:   03/23/18 0730  TempSrc:   PainSc: 6    Pain Goal: Patients Stated Pain Goal: 3 (03/23/18 0730)               Lynda Rainwater

## 2018-03-27 ENCOUNTER — Encounter: Payer: Self-pay | Admitting: Gynecologic Oncology

## 2018-03-29 ENCOUNTER — Ambulatory Visit: Payer: Managed Care, Other (non HMO) | Admitting: Gynecologic Oncology

## 2018-04-03 ENCOUNTER — Telehealth: Payer: Self-pay | Admitting: Gynecologic Oncology

## 2018-04-03 ENCOUNTER — Telehealth: Payer: Self-pay | Admitting: *Deleted

## 2018-04-03 NOTE — Telephone Encounter (Signed)
Patient's husband called and stated " Can you please have Melissa call my wife, she wants to speak to her regarding scheduling her missed appt. She also wants to talk to her about something." Explained that I will have Melissa call them back later today.

## 2018-04-03 NOTE — Telephone Encounter (Signed)
Per scheduling voicemail log the appointment for 12/04 was already cancelled.  **not a patient at the cancer cent[facility].**

## 2018-04-03 NOTE — Telephone Encounter (Signed)
Spoke with Misty Hopkins and she states that both her legs are swollen.  There are blisters popping up with while centers. This swelling began while she was in the hospital in [place] last week for an ileus.  She was instructed to increase her walking. She has a slightly raised pimple like rash on her left shin. It is not itching. 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 Afebrile. She can bear weight with out pain when walking. She is on Lovenox 40 mg sq daily prophylactics from surgery on 03-21-18. Bowels moved yesterday. Pt taking 2 Senokot-S at hs.  Reviewed above with Misty John, NP. Told Misty Hopkins that Misty Hopkins feels she needs to contact her PCP in [place] to have her legs evaluated.  She maybe fluid overloaded from the IVF in the hospital.  She may be beginning a cellulitis as well. Told Misty. Hopkins that even though is is using the Lovenox and evaluation for a blood clot in her legs maybe needed after is is evaluated by her PCP. Requested that she have her PCP fax the records to our office from her visit so Misty Hopkins has the information for her post op visit on Friday 04-07-18 at 9:30 am.  Arrival at 0915 to register. Gave pt fax number [phone]. Pt verbalized understanding.

## 2018-04-06 ENCOUNTER — Telehealth: Payer: Self-pay | Admitting: *Deleted

## 2018-04-06 NOTE — Telephone Encounter (Signed)
Patient's husband called back and stated "Misty Hopkins get to urgent on Monday for her legs. We couldn't wait to see the PCP. The doctor at the urgent care prescribed lasix, potassium and magnesium. When we saw the PCP yesterday I believe, she agreed with the urgent care doctor. They did draw labs at the PCP office if you need them. Wee will see you tomorrow."  Misty Hopkins APP notified

## 2018-04-07 ENCOUNTER — Encounter: Payer: Self-pay | Admitting: Oncology

## 2018-04-07 ENCOUNTER — Inpatient Hospital Stay: Payer: Medicare HMO | Attending: Gynecologic Oncology | Admitting: Gynecologic Oncology

## 2018-04-07 ENCOUNTER — Encounter: Payer: Self-pay | Admitting: Gynecologic Oncology

## 2018-04-07 ENCOUNTER — Telehealth: Payer: Self-pay | Admitting: Oncology

## 2018-04-07 VITALS — BP 150/78 | HR 83 | Temp 98.2°F | Resp 16 | Ht 64.0 in | Wt 145.0 lb

## 2018-04-07 DIAGNOSIS — Z7189 Other specified counseling: Secondary | ICD-10-CM

## 2018-04-07 DIAGNOSIS — G893 Neoplasm related pain (acute) (chronic): Secondary | ICD-10-CM | POA: Insufficient documentation

## 2018-04-07 DIAGNOSIS — Z90710 Acquired absence of both cervix and uterus: Secondary | ICD-10-CM | POA: Diagnosis not present

## 2018-04-07 DIAGNOSIS — C569 Malignant neoplasm of unspecified ovary: Secondary | ICD-10-CM

## 2018-04-07 DIAGNOSIS — Z9221 Personal history of antineoplastic chemotherapy: Secondary | ICD-10-CM | POA: Diagnosis not present

## 2018-04-07 DIAGNOSIS — R54 Age-related physical debility: Secondary | ICD-10-CM | POA: Diagnosis not present

## 2018-04-07 DIAGNOSIS — T8149XA Infection following a procedure, other surgical site, initial encounter: Secondary | ICD-10-CM | POA: Diagnosis not present

## 2018-04-07 DIAGNOSIS — L03311 Cellulitis of abdominal wall: Secondary | ICD-10-CM | POA: Diagnosis not present

## 2018-04-07 DIAGNOSIS — C786 Secondary malignant neoplasm of retroperitoneum and peritoneum: Secondary | ICD-10-CM | POA: Diagnosis present

## 2018-04-07 DIAGNOSIS — Z90722 Acquired absence of ovaries, bilateral: Secondary | ICD-10-CM | POA: Insufficient documentation

## 2018-04-07 DIAGNOSIS — Z79899 Other long term (current) drug therapy: Secondary | ICD-10-CM | POA: Insufficient documentation

## 2018-04-07 DIAGNOSIS — C562 Malignant neoplasm of left ovary: Secondary | ICD-10-CM | POA: Diagnosis present

## 2018-04-07 DIAGNOSIS — C5701 Malignant neoplasm of right fallopian tube: Secondary | ICD-10-CM | POA: Diagnosis present

## 2018-04-07 DIAGNOSIS — C5702 Malignant neoplasm of left fallopian tube: Secondary | ICD-10-CM | POA: Insufficient documentation

## 2018-04-07 DIAGNOSIS — Z791 Long term (current) use of non-steroidal anti-inflammatories (NSAID): Secondary | ICD-10-CM | POA: Diagnosis not present

## 2018-04-07 MED ORDER — CEPHALEXIN 500 MG PO CAPS: 500.0000 mg | ORAL_CAPSULE | Freq: Four times a day (QID) | ORAL | 1 refills | Status: DC

## 2018-04-07 NOTE — Telephone Encounter (Signed)
Confirmed genetics apt with Eddie Dibbles, patients spouse.

## 2018-04-07 NOTE — Telephone Encounter (Signed)
Left a message for patient with appointment for genetic counseling on 05/02/2018 at 11 am.  Requested a return call.

## 2018-04-07 NOTE — Progress Notes (Signed)
Follow-up Note: Gyn-Onc  Consult was requested by Dr. Helane Rima for the evaluation of Misty Hopkins 77 y.o. female  CC:  Chief Complaint  Patient presents with  . Ovarian cancer on left [facility])    Assessment/Plan:  Misty Hopkins  is a 77 y.o.  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  She was readmitted postop with ileus which responded to supportive therapy.  1/ fallopian tube cancer (right) We are recommending 6 cycles of adjuvant chemotherapy. We recommend genetic consultation.   2/ incisional cellulitis: prescribed Keflex x 1 week. Will reinspect her incision when she returns for med onc consult.  3/ postop edema: consistent with excessive resuscitation. Recommend continue Lasix and K+  HPI: Misty Hopkins is a 77 year old P1 who was seen in consultation at the request of Dr. Helane Rima for omental nodularity and apparent carcinomatosis.  The patient reports a history of vague pelvic aching since July 2019.  This seemed worse with passage of gas through the colon and having a bowel movement.  She also noticed a change in her bowel habit with narrowing of stools and pain with having bowel movements.  She describes the pain is rolling gas pains.  She had a colonoscopy 2 years prior to the onset of the pain which revealed diverticulosis.  When she began experiencing these symptoms primary care physician presumptively and empirically prescribed ciprofloxacin for presumed diverticulitis.  This resulted in slight easing up with the pain but it never eliminated.  She then proceeded to have worsening pain in October 2019 and was seen in an urgent care where she was prescribed Flagyl for presumed diverticulitis.  Because of the persistence of the pain a CT scan of the abdomen and pelvis was ordered on February 20, 2018 and this was performed at [facility].  At the time of this initial  evaluation only the CT scan report was available.  This revealed an incidental finding of a 1.5 cm subcapsular lesion in the lateral upper pole of the left kidney which measured higher than fluid attenuation which could represent a proteinaceous renal cyst or solid renal mass.  There was no ascites.  There is no lymphadenopathy.  There were no masses in the pelvis including ovarian or adnexal masses seen.  There was however soft tissue stranding seen with nodularity in the omental fat in the lower abdomen without evidence of ascites.  This was felt to represent either carcinomatosis or inflammatory infectious etiologies.  There was colonic diverticulosis seen without radiographic evidence of diverticulitis.  The radiologist recommended MRI of the abdomen to further work-up the renal mass.    In follow-up of the CT scan she was seen by Dr.Grewal ordered a Ca1 25 on 02/22/2018 which was elevated at 103.  She also performed a transvaginal ultrasound that same day which revealed a surgically absent uterus, and neither ovary was visualized due to diffuse bowel shadowing, but no adnexal masses or free fluid was seen.  The patient is otherwise fairly healthy 77 year old woman.  She has a history of one prior vaginal delivery complicated by pelvic organ relaxation.  She subsequently underwent a vaginal hysterectomy and then subsequently vaginal and abdominal Burch procedure and rectal prolapse surgery to repair pelvic organ prolapse.  Her past medical history is mostly significant for interstitial cystitis and diverticulitis.  Her past surgical history is significant for vaginal surgery as stated above, her only abdominal surgery  was an abdominal Burch procedure.  She lives with her husband who is of good health.  Her family history significant only for lung cancer and a father who was a smoker.  Interval Hx:   On March 06, 2018 she underwent a diagnostic laparoscopy which revealed an omentum that was densely  adherent to the anterior abdominal wall.  It did not appear grossly consistent with metastatic carcinoma and a biopsy was taken of this omentum.  There was a normal upper abdominal survey.  The pelvis could not be well evaluated due to omental adhesions.  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  As a result of this she was taken to the operating room for definitive debulking procedure on March 21, 2018.  This required an exploratory laparotomy with bilateral salpingo-oophorectomy, omentectomy, radical tumor debulking, ventral hernia repair.  Intraoperative findings were significant for abdominal wall masses consistent with port site metastases from her prior surgery which included a 7 cm lesion at the umbilical incision in the left lateral incision site lesion measuring 5 cm.  There is omental caking in the infracolic omentum but normal gastrocolic omentum.  There were grossly normal but very small fallopian tubes and ovaries.  The diaphragms were grossly normal.  There was no palpable lymphadenopathy.  The small and large intestine with the exception of miliary studding of tumor on the surface of the sigmoid colon and cul-de-sac and the rectum were grossly normal.  The resection represented R1 optimal cytoreduction with no gross visible disease remaining.  There was a thin rind of tumor on the lateral left fascia of the left abdominal wall at the port site metastasis that was residual.  Initially postoperatively she did well.  She was discharged on postoperative day 2.  However she was readmitted to a local hospital with postoperative ileus on postoperative day 5.  She received IV fluids and nasogastric tube decompression.  She developed volume overload and required Lasix for diuresis.  Her ileus responded spontaneously to conservative management.  She did not require surgery.  CT imaging during that admission demonstrated no  gross residual disease, and contrast that traveled throughout the intestinal tract consistent with ileus rather than obstruction.  She returns today for postoperative evaluation and counseling.  Overall she is doing well but still has some residual edema.  She has some lower incisional discomfort.  She has not removed the dressing since her surgery 2 weeks ago.  Current Meds:  Outpatient Encounter Medications as of 04/07/2018  Medication Sig  . carboxymethylcellulose (REFRESH PLUS) 0.5 % SOLN Place 1 drop into both eyes 3 (three) times daily as needed (for dry eyes.).  Marland Kitchen cephALEXin (KEFLEX) 250 MG capsule Take 1 capsule (250 mg total) by mouth 4 (four) times daily.  . Cholecalciferol (VITAMIN D3 SUPER STRENGTH) 50 MCG (2000 UT) TABS Take 2,000 Units by mouth daily.  . clindamycin (CLINDAGEL) 1 % gel Apply 1 application topically 2 (two) times daily.  Marland Kitchen enoxaparin (LOVENOX) 40 MG/0.4ML injection Inject 0.4 mLs (40 mg total) into the skin daily. To begin after surgery  . furosemide (LASIX) 40 MG tablet Take by mouth.  Marland Kitchen ibuprofen (ADVIL,MOTRIN) 600 MG tablet Take 1 tablet (600 mg total) by mouth every 6 (six) hours as needed.  . magnesium oxide (MAG-OX) 400 MG tablet Take by mouth.  . oxyCODONE (OXY IR/ROXICODONE) 5 MG immediate release tablet Take 1 tablet (5 mg total) by mouth every 4 (four) hours as needed  for severe pain.  . potassium chloride SA (K-DUR,KLOR-CON) 20 MEQ tablet Take by mouth.  . senna-docusate (SENOKOT-S) 8.6-50 MG tablet Take 2 tablets by mouth at bedtime. Hold if having loose stools  . traZODone (DESYREL) 50 MG tablet Take 50 mg by mouth at bedtime as needed for sleep.   Marland Kitchen triamcinolone cream (KENALOG) 0.5 % Apply 1 application topically 2 (two) times daily. Apply to eyelids/eyebrows  . cephALEXin (KEFLEX) 500 MG capsule Take 1 capsule (500 mg total) by mouth 4 (four) times daily.  . [DISCONTINUED] Biotin 5000 MCG TABS Take 5,000 mcg by mouth daily.   . [DISCONTINUED]  oxyCODONE-acetaminophen (PERCOCET) 5-325 MG tablet Take 1-2 tablets by mouth every 4 (four) hours as needed for severe pain. (Patient not taking: Reported on 04/07/2018)  . [DISCONTINUED] oxyCODONE-acetaminophen (PERCOCET) 5-325 MG tablet Take 1-2 tablets by mouth every 4 (four) hours as needed for severe pain. (Patient not taking: Reported on 04/07/2018)   No facility-administered encounter medications on file as of 04/07/2018.     Allergy:  Allergies  Allergen Reactions  . Ivp Dye [Iodinated Diagnostic Agents] Anaphylaxis    Anaphylaxis and hives  . Propoxyphene Anaphylaxis, Shortness Of Breath and Swelling    swollen all over  . Codeine Nausea Only and Other (See Comments)    Headaches.  Marland Kitchen Ultram [Tramadol Hcl] Hives    Social Hx:   Social History   Socioeconomic History  . Marital status: Married    Spouse name: Not on file  . Number of children: Not on file  . Years of education: Not on file  . Highest education level: Not on file  Occupational History  . Not on file  Social Needs  . Financial resource strain: Not on file  . Food insecurity:    Worry: Not on file    Inability: Not on file  . Transportation needs:    Medical: Not on file    Non-medical: Not on file  Tobacco Use  . Smoking status: Never Smoker  . Smokeless tobacco: Never Used  Substance and Sexual Activity  . Alcohol use: Never    Frequency: Never  . Drug use: Never  . Sexual activity: Not on file  Lifestyle  . Physical activity:    Days per week: Not on file    Minutes per session: Not on file  . Stress: Not on file  Relationships  . Social connections:    Talks on phone: Not on file    Gets together: Not on file    Attends religious service: Not on file    Active member of club or organization: Not on file    Attends meetings of clubs or organizations: Not on file    Relationship status: Not on file  . Intimate partner violence:    Fear of current or ex partner: Not on file     Emotionally abused: Not on file    Physically abused: Not on file    Forced sexual activity: Not on file  Other Topics Concern  . Not on file  Social History Narrative  . Not on file    Past Surgical Hx:  Past Surgical History:  Procedure Laterality Date  . Bladder Lift  2000  . DEBULKING N/A 03/21/2018   Procedure: RADICAL TUMOR DEBULKING;  Surgeon: Everitt Amber, MD;  Location: [facility];  Service: Gynecology;  Laterality: N/A;  . EYE SURGERY  2017   Cataracts (Bilateral)  . LAPAROSCOPIC SALPINGO OOPHERECTOMY Bilateral 03/21/2018   Procedure: SALPINGO OOPHORECTOMY;  Surgeon: Everitt Amber, MD;  Location: [facility];  Service: Gynecology;  Laterality: Bilateral;  . LAPAROSCOPY N/A 03/06/2018   Procedure: LAPAROSCOPY DIAGNOSTIC OMENTAL BIOPSY;  Surgeon: Everitt Amber, MD;  Location: [facility];  Service: Gynecology;  Laterality: N/A;  . LAPAROTOMY N/A 03/21/2018   Procedure: EXPLORATORY LAPAROTOMY, INCISIONAL HERNIA REPAIR;  Surgeon: Everitt Amber, MD;  Location: [facility];  Service: Gynecology;  Laterality: N/A;  . OMENTECTOMY N/A 03/21/2018   Procedure: OMENTECTOMY;  Surgeon: Everitt Amber, MD;  Location: [facility];  Service: Gynecology;  Laterality: N/A;  . PARTIAL HYSTERECTOMY     40 years ago  . Rectum Lift  2001  . TONSILLECTOMY      Past Medical Hx:  Past Medical History:  Diagnosis Date  . Complication of anesthesia    Difficult to put to sleep  . GERD (gastroesophageal reflux disease)     Past Gynecological History:  No prior abnormal paps, hysterectomy age 25. Last pap normal 2019 (vaginal). No LMP recorded. Patient has had a hysterectomy.  Family Hx:  Family History  Problem Relation Age of Onset  . Diabetes Mother   . Stroke Mother   . Non-Hodgkin's lymphoma Brother     Review of Systems:  Constitutional  Feels well,  ENT Normal appearing ears and nares bilaterally Skin/Breast  No rash, sores, jaundice, itching, dryness Cardiovascular  No chest pain,  shortness of breath, or edema  Pulmonary  No cough or wheeze.  Gastro Intestinal  + lower abdominal pain, change in bowel habit (narrowing of stools). Genito Urinary  No frequency, urgency, dysuria, Musculo Skeletal  No myalgia, arthralgia, joint swelling or pain  Neurologic  No weakness, numbness, change in gait,  Psychology  No depression, anxiety, insomnia.   Vitals:  Blood pressure (!) 150/78, pulse 83, temperature 98.2 F (36.8 C), temperature source Oral, resp. rate 16, height 5\' 4"  (1.626 m), weight 145 lb (65.8 kg), SpO2 98 %.  Physical Exam: WD in NAD Neck  Supple NROM, without any enlargements.  Lymph Node Survey No cervical supraclavicular or inguinal adenopathy Cardiovascular  Pulse normal rate, regularity and rhythm. S1 and S2 normal.  Lungs  Clear to auscultation bilateraly, without wheezes/crackles/rhonchi. Good air movement.  Skin  No rash/lesions/breakdown  Psychiatry  Alert and oriented to person, place, and time  Abdomen  Normoactive bowel sounds, abdomen soft, non-tender and nonobese without evidence of hernia. No masses appreciated. Not distended with ascites. The dressing was removed and revealed an in tact incision. There is some blanching erythema in the inferior aspect of the incision and some maceration of the incision but no drainage or collections. There is no palpable tumor underlying the left lower quadrant incision site.  Back No CVA tenderness Genito Urinary  deferred Rectal  deferred Extremities  No bilateral cyanosis, clubbing or edema.   30 minutes of direct face to face counseling time was spent with the patient. This included discussion about prognosis, therapy recommendations and postoperative side effects and are beyond the scope of routine postoperative care.   Thereasa Solo, MD  04/07/2018, 10:14 AM

## 2018-04-07 NOTE — Patient Instructions (Signed)
Dr Denman George has prescribed an antibiotic to take for 7 days for your skin infection. Dr Denman George will schedule you to meet with a chemotherapy doctor to discuss starting chemotherapy.   Dr Denman George will see you back after completing chemotherapy.

## 2018-04-10 ENCOUNTER — Inpatient Hospital Stay (HOSPITAL_BASED_OUTPATIENT_CLINIC_OR_DEPARTMENT_OTHER): Payer: Medicare HMO | Admitting: Hematology and Oncology

## 2018-04-10 ENCOUNTER — Telehealth: Payer: Self-pay | Admitting: Hematology and Oncology

## 2018-04-10 ENCOUNTER — Other Ambulatory Visit: Payer: Self-pay | Admitting: Gynecologic Oncology

## 2018-04-10 DIAGNOSIS — C562 Malignant neoplasm of left ovary: Secondary | ICD-10-CM

## 2018-04-10 DIAGNOSIS — C5701 Malignant neoplasm of right fallopian tube: Secondary | ICD-10-CM | POA: Diagnosis not present

## 2018-04-10 DIAGNOSIS — Z79899 Other long term (current) drug therapy: Secondary | ICD-10-CM

## 2018-04-10 DIAGNOSIS — T8149XA Infection following a procedure, other surgical site, initial encounter: Secondary | ICD-10-CM

## 2018-04-10 DIAGNOSIS — L03311 Cellulitis of abdominal wall: Secondary | ICD-10-CM

## 2018-04-10 DIAGNOSIS — C786 Secondary malignant neoplasm of retroperitoneum and peritoneum: Secondary | ICD-10-CM | POA: Diagnosis not present

## 2018-04-10 DIAGNOSIS — R54 Age-related physical debility: Secondary | ICD-10-CM | POA: Diagnosis not present

## 2018-04-10 DIAGNOSIS — Z9221 Personal history of antineoplastic chemotherapy: Secondary | ICD-10-CM

## 2018-04-10 DIAGNOSIS — G893 Neoplasm related pain (acute) (chronic): Secondary | ICD-10-CM

## 2018-04-10 DIAGNOSIS — Z791 Long term (current) use of non-steroidal anti-inflammatories (NSAID): Secondary | ICD-10-CM

## 2018-04-10 MED ORDER — OXYCODONE HCL 5 MG PO TABS: 5.0000 mg | ORAL_TABLET | ORAL | 0 refills | Status: DC | PRN

## 2018-04-10 MED ORDER — CIPROFLOXACIN HCL 500 MG PO TABS: 500.0000 mg | ORAL_TABLET | Freq: Two times a day (BID) | ORAL | 0 refills | Status: DC

## 2018-04-10 MED FILL — oxyCODONE HCL 5 MG TABS: 5 | 5 days supply | Qty: 30 | Fill #0

## 2018-04-10 MED FILL — CIPROFLOXACIN HCL 500 MG TA: 500 | 10 days supply | Qty: 20 | Fill #0

## 2018-04-10 NOTE — Telephone Encounter (Signed)
Gave avs and calendar ° °

## 2018-04-10 NOTE — Progress Notes (Signed)
Patient seen during visit with Dr. Alvy Bimler.  Reporting that her incision has been draining a moderate amount.  Reports moderate pain at the lower incision.  See media for photo.  Top skin layer laying to the side of the incision.  Erythema marked. Per Dr. Denman George, add Cipro to the Keflex she is already taking.  Reportable signs and symptoms reviewed.  She is to follow up with Dr. Alvy Bimler in one week and her incision will be assessed at that time. Pt denies aortic aneurysm and states she has taken Cipro in the past and tolerated well for kidney issues.

## 2018-04-11 ENCOUNTER — Encounter: Payer: Self-pay | Admitting: Hematology and Oncology

## 2018-04-11 ENCOUNTER — Other Ambulatory Visit: Payer: Self-pay | Admitting: Hematology and Oncology

## 2018-04-11 DIAGNOSIS — G893 Neoplasm related pain (acute) (chronic): Secondary | ICD-10-CM | POA: Insufficient documentation

## 2018-04-11 DIAGNOSIS — T8149XA Infection following a procedure, other surgical site, initial encounter: Secondary | ICD-10-CM | POA: Insufficient documentation

## 2018-04-11 NOTE — Assessment & Plan Note (Signed)
She is frail.  She has not completely healed from recent surgery I recommend chemo education class and repeat assessment next week She would need port placement in the future and 6 cycles of adjuvant chemotherapy with carboplatin and Taxol

## 2018-04-11 NOTE — Assessment & Plan Note (Signed)
She has poorly controlled pain after recent surgery I recommend prescription pain medicine I warned her about risk of nausea and constipation

## 2018-04-11 NOTE — Assessment & Plan Note (Signed)
She has completed recent antibiotic therapy.  Her skin appear infected She will be evaluated further by GYN oncologist for further management

## 2018-04-11 NOTE — Progress Notes (Signed)
START ON PATHWAY REGIMEN - Ovarian     A cycle is every 21 days:     Paclitaxel      Carboplatin   **Always confirm dose/schedule in your pharmacy ordering system**  Patient Characteristics: Postoperative without Neoadjuvant Therapy (Pathologic Staging), Newly Diagnosed, Adjuvant Therapy, Stage IIB and Stage IIIA/B/C Optimal Cytoreduction Therapeutic Status: Postoperative without Neoadjuvant Therapy (Pathologic Staging) BRCA Mutation Status: Awaiting Test Results AJCC 8 Stage Grouping: IIIC AJCC M Category: cM0 AJCC T Category: pTX AJCC N Category: pNX Intent of Therapy: Curative Intent, Discussed with Patient

## 2018-04-11 NOTE — Progress Notes (Signed)
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  She has not completely healed from recent surgery I recommend chemo education class and repeat assessment next week She would need port placement in the future and 6 cycles of adjuvant chemotherapy with carboplatin and Taxol  Cancer associated pain She has poorly controlled pain after recent surgery I recommend prescription pain medicine I warned her about risk of nausea and constipation  Wound infection after surgery She has completed recent antibiotic therapy.  Her skin appear infected She will be evaluated further by GYN oncologist for further management   I spent 40 minutes counseling the patient face to face. The total time spent in the appointment was 60 minutes and more than 50% was on counseling.  All questions were answered. The patient knows to call the clinic with any problems, questions or concerns.  [name], MD 04/11/2018 9:59 AM   CHIEF COMPLAINTS/PURPOSE OF CONSULTATION:  Ovarian cancer, for further management  HISTORY OF PRESENTING ILLNESS:  Misty Hopkins 77 y.o. female is here because of recent diagnosis of ovarian cancer. Her husband, Eddie Dibbles, is present. She has 1 son.  The patient have remote history of hysterectomy at age of 7 due to uterine prolapse. She is retired and has been previously active until July of this year when she started to have vague lower abdominal pain and discomfort.  She was diagnosed with diverticulitis and have received 2 courses of antibiotic treatment without success of treating her symptoms.  Subsequently, she underwent further imaging study and was subsequently found to have cancer.  She denies history of abnormal Pap smear or postmenopausal bleeding I have reviewed her chart and materials related to her cancer extensively and  collaborated history with the patient. Summary of oncologic history is as follows: Oncology History   High grade serous     Fallopian tube cancer, carcinoma, right ([place])   10/24/2017 Initial Diagnosis    She has presentation of vague abdominal pain    02/20/2018 Imaging    Outside CT abdomen and pelvis: This revealed an incidental finding of a 1.5 cm subcapsular lesion in the lateral upper pole of the left kidney which measured higher than fluid attenuation which could represent a proteinaceous renal cyst or solid renal mass.  There was no ascites.  There is no lymphadenopathy.  There were no masses in the pelvis including ovarian or adnexal masses seen.  There was however soft tissue stranding seen with nodularity in the omental fat in the lower abdomen without evidence of ascites.  This was felt to represent either carcinomatosis or inflammatory infectious etiologies.  There was colonic diverticulosis seen without radiographic evidence of diverticulitis.  The radiologist recommended MRI of the abdomen to further work-up the renal mass    02/22/2018 Tumor Marker    Patient's tumor was tested for the following markers: CA-125. Results of the tumor marker test revealed 103.    03/06/2018 Pathology Results    Omentum, biopsy - ADENOCARCINOMA WITH PSAMMOMA BODIES, SEE COMMENT. Microscopic Comment There are scattered small foci of malignant glands with associated psammoma bodies. While the tumor is somewhat limited the cells have a more low grade appearance. There is prominent lymphovascular invasion. Immunohistochemistry reveals the cells are positive for cytokeratin 7, ER, MOC31, PAX8, and WT-1. They are negative for calretinin, cytokeratin 20, CDX-2, and TTF-1. Overall, the findings are  consistent with adenocarcinoma. The differential includes a primary gynecologic or peritoneal tumor, although the morphology is not typical of a high grade serous carcinoma.    03/06/2018 Surgery    Surgeon:  Donaciano Eva   Pre-operative Diagnosis: omental mass, elevated CA 125 Operation: Laparoscopic omentectomy  Surgeon: Donaciano Eva  Operative Findings:  : normal diaphragm and upper omentum. Sigmoid colon densely adherent to left pelvic side wall consistent with history of diverticulitis. Omentum adherent to anterior abdominal wall and sigmoid colon. Surgically absent uterus. Right ovary very small and grossly normal. Left ovary obscured by adhesed sigmoid colon. No ascites. No carcinomatosis. There was a nodular firm distal omentum on the left which was adherent to the sigmoid colon and most consistent with inflammatory change.       03/21/2018 Pathology Results    1. Soft tissue mass, simple excision, midline abdominal wall mass - METASTATIC SEROUS CARCINOMA. 2. Omentum, resection for tumor - METASTATIC SEROUS CARCINOMA. 3. Adnexa - ovary +/- tube, neoplastic, right - RIGHT FALLOPIAN TUBE: -HIGH GRADE SEROUS CARCINOMA. -SEE ONCOLOGY TABLE. - RIGHT OVARY: SURFACE PSAMMOMA BODIES. NO DEFINITIVE MALIGNANT CELLS. -INCLUSION CYSTS. 4. Adnexa - ovary +/- tube, neoplastic, left - LEFT FALLOPIAN TUBE: LUMINAL AND SURFACE SEROUS CARCINOMA. - LEFT OVARY: FOCAL PSAMMOMA BODIES AND MALIGNANT CELLS. 5. Soft tissue mass, simple excision, left abdominal wall - METASTATIC SEROUS CARCINOMA. Microscopic Comment 3. OVARY or FALLOPIAN TUBE or PRIMARY PERITONEUM: Procedure: Bilateral salpingo-oophorectomy. Abdominal wall mass excision and omentectomy. Specimen Integrity: Intact. Tumor Site: Right fallopian tube. Ovarian Surface Involvement (required only if applicable): Present (left). Fallopian Tube Surface Involvement (required only if applicable): Present. Tumor Size: 1.5 cm. Histologic Type: High grade serous carcinoma. Histologic Grade: High grade. Implants (required for advanced stage serous/seromucinous borderline tumors only): Present. Other Tissue/ Organ Involvement:  Omentum, abdominal wall, left fallopian tube and ovary. Largest Extrapelvic Peritoneal Focus (required only if applicable): >2 cm. Peritoneal/Ascitic Fluid: N/A. Treatment Effect (required only for high-grade serous carcinomas): N/A. Regional Lymph Nodes: No lymph nodes submitted or found Pathologic Stage Classification (pTNM, AJCC 8th Edition): pT3c, pNX Representative Tumor Block: 3A Comment(s): None.    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   Surgeon: Thereasa Solo, MD. Specimens: Bilateral tubes / ovaries, omentum. Midline abdominal wall mass, left lateral abdominal wall mass.    Operative Findings:  Abdominal wall masses consistent with port site metastases at the umbilical incision (7cm) and left lateral incision (5cm). Omental caking in the infracolic omentum. Grossly normal very small tubes and ovaries. Normal diaphragms. No lymphadenopathy. Normal small and large intestine with the exception of miliary studding of tumor on the surface of the sigmoid colon and rectum in the cul de sac.    This represented an optimal cytoreduction (R1) with no gross visible disease remaining, however there was a thin rind of tumor on the lateral left fascia associated with the port site metastasis.      04/11/2018 Cancer Staging    Staging form: Ovary, Fallopian Tube, and Primary Peritoneal Carcinoma, AJCC 8th Edition - Pathologic: FIGO Stage IIIC (pT3, pN0, cM0) - Signed by [name], MD on 04/11/2018    Since surgery, she has poor wound healing and recurrence redness and blister along her incision site.  She denies fever or chills.  She was recently prescribed antibiotic treatment which she completed recently.  No recent cough No changes in bowel habits She was also  placed on diuretic therapy due to significant fluid retention after surgery  MEDICAL HISTORY:  Past Medical History:  Diagnosis Date  . Complication of anesthesia    Difficult to put to sleep  . GERD (gastroesophageal reflux disease)     SURGICAL HISTORY: Past Surgical History:  Procedure Laterality Date  . Bladder Lift  2000  . DEBULKING N/A 03/21/2018   Procedure: RADICAL TUMOR DEBULKING;  Surgeon: Everitt Amber, MD;  Location: [facility];  Service: Gynecology;  Laterality: N/A;  . EYE SURGERY  2017   Cataracts (Bilateral)  . LAPAROSCOPIC SALPINGO OOPHERECTOMY Bilateral 03/21/2018   Procedure: SALPINGO OOPHORECTOMY;  Surgeon: Everitt Amber, MD;  Location: [facility];  Service: Gynecology;  Laterality: Bilateral;  . LAPAROSCOPY N/A 03/06/2018   Procedure: LAPAROSCOPY DIAGNOSTIC OMENTAL BIOPSY;  Surgeon: Everitt Amber, MD;  Location: [facility];  Service: Gynecology;  Laterality: N/A;  . LAPAROTOMY N/A 03/21/2018   Procedure: EXPLORATORY LAPAROTOMY, INCISIONAL HERNIA REPAIR;  Surgeon: Everitt Amber, MD;  Location: [facility];  Service: Gynecology;  Laterality: N/A;  . OMENTECTOMY N/A 03/21/2018   Procedure: OMENTECTOMY;  Surgeon: Everitt Amber, MD;  Location: [facility];  Service: Gynecology;  Laterality: N/A;  . PARTIAL HYSTERECTOMY     40 years ago  . Rectum Lift  2001  . TONSILLECTOMY      SOCIAL HISTORY: Social History   Socioeconomic History  . Marital status: Married    Spouse name: Not on file  . Number of children: Not on file  . Years of education: Not on file  . Highest education level: Not on file  Occupational History  . Not on file  Social Needs  . Financial resource strain: Not on file  . Food insecurity:    Worry: Not on file    Inability: Not on file  . Transportation needs:    Medical: Not on file    Non-medical: Not on file  Tobacco Use  . Smoking status: Never Smoker  . Smokeless tobacco: Never Used  Substance and Sexual Activity  . Alcohol use: Never     Frequency: Never  . Drug use: Never  . Sexual activity: Not on file  Lifestyle  . Physical activity:    Days per week: Not on file    Minutes per session: Not on file  . Stress: Not on file  Relationships  . Social connections:    Talks on phone: Not on file    Gets together: Not on file    Attends religious service: Not on file    Active member of club or organization: Not on file    Attends meetings of clubs or organizations: Not on file    Relationship status: Not on file  . Intimate partner violence:    Fear of current or ex partner: Not on file    Emotionally abused: Not on file    Physically abused: Not on file    Forced sexual activity: Not on file  Other Topics Concern  . Not on file  Social History Narrative  . Not on file    FAMILY HISTORY: Family History  Problem Relation Age of Onset  . Diabetes Mother   . Stroke Mother   . Non-Hodgkin's lymphoma Brother     ALLERGIES:  is allergic to ivp dye [iodinated diagnostic agents]; propoxyphene; codeine; and ultram [tramadol hcl].  MEDICATIONS:  Current Outpatient Medications  Medication Sig Dispense Refill  . carboxymethylcellulose (REFRESH  PLUS) 0.5 % SOLN Place 1 drop into both eyes 3 (three) times daily as needed (for dry eyes.).    Marland Kitchen cephALEXin (KEFLEX) 250 MG capsule Take 1 capsule (250 mg total) by mouth 4 (four) times daily. 28 capsule 0  . cephALEXin (KEFLEX) 500 MG capsule Take 1 capsule (500 mg total) by mouth 4 (four) times daily. 28 capsule 1  . Cholecalciferol (VITAMIN D3 SUPER STRENGTH) 50 MCG (2000 UT) TABS Take 2,000 Units by mouth daily.    . ciprofloxacin (CIPRO) 500 MG tablet Take 1 tablet (500 mg total) by mouth 2 (two) times daily. 20 tablet 0  . clindamycin (CLINDAGEL) 1 % gel Apply 1 application topically 2 (two) times daily.    Marland Kitchen enoxaparin (LOVENOX) 40 MG/0.4ML injection Inject 0.4 mLs (40 mg total) into the skin daily. To begin after surgery 28 Syringe 0  . furosemide (LASIX) 40 MG  tablet Take by mouth.    Marland Kitchen ibuprofen (ADVIL,MOTRIN) 600 MG tablet Take 1 tablet (600 mg total) by mouth every 6 (six) hours as needed. 30 tablet 0  . magnesium oxide (MAG-OX) 400 MG tablet Take by mouth.    . oxyCODONE (OXY IR/ROXICODONE) 5 MG immediate release tablet Take 1 tablet (5 mg total) by mouth every 4 (four) hours as needed for severe pain. 30 tablet 0  . potassium chloride SA (K-DUR,KLOR-CON) 20 MEQ tablet Take by mouth.    . senna-docusate (SENOKOT-S) 8.6-50 MG tablet Take 2 tablets by mouth at bedtime. Hold if having loose stools 60 tablet 1  . traZODone (DESYREL) 50 MG tablet Take 50 mg by mouth at bedtime as needed for sleep.   0  . triamcinolone cream (KENALOG) 0.5 % Apply 1 application topically 2 (two) times daily. Apply to eyelids/eyebrows  4   No current facility-administered medications for this visit.     REVIEW OF SYSTEMS:   Constitutional: Denies fevers, chills or abnormal night sweats Eyes: Denies blurriness of vision, double vision or watery eyes Ears, nose, mouth, throat, and face: Denies mucositis or sore throat Respiratory: Denies cough, dyspnea or wheezes Cardiovascular: Denies palpitation, chest discomfort or lower extremity swelling Gastrointestinal:  Denies nausea, heartburn or change in bowel habits Lymphatics: Denies new lymphadenopathy or easy bruising Neurological:Denies numbness, tingling or new weaknesses Behavioral/Psych: Mood is stable, no new changes  All other systems were reviewed with the patient and are negative.  PHYSICAL EXAMINATION: ECOG PERFORMANCE STATUS: 2 - Symptomatic, <50% confined to bed  Vitals:   04/10/18 1429  BP: 133/68  Pulse: 93  Resp: (!) 8  SpO2: 97%   Filed Weights   04/10/18 1429  Weight: 141 lb 6.4 oz (64.1 kg)    GENERAL:alert, no distress and comfortable SKIN: skin color, texture, turgor are normal, no rashes or significant lesions EYES: normal, conjunctiva are pink and non-injected, sclera  clear OROPHARYNX:no exudate, no erythema and lips, buccal mucosa, and tongue normal  NECK: supple, thyroid normal size, non-tender, without nodularity LYMPH:  no palpable lymphadenopathy in the cervical, axillary or inguinal LUNGS: clear to auscultation and percussion with normal breathing effort HEART: regular rate & rhythm and no murmurs and no lower extremity edema ABDOMEN:abdomen soft, there is significant redness and tenderness along the incision site compatible with postoperative wound infection. Musculoskeletal:no cyanosis of digits and no clubbing  PSYCH: alert & oriented x 3 with fluent speech NEURO: no focal motor/sensory deficits  LABORATORY DATA:  I have reviewed the data as listed Lab Results  Component Value Date   WBC  14.0 (H) 03/23/2018   HGB 10.2 (L) 03/23/2018   HCT 33.3 (L) 03/23/2018   MCV 92.5 03/23/2018   PLT 186 03/23/2018   Recent Labs    03/03/18 0826 03/15/18 0812 03/22/18 0444 03/23/18 0459  NA 142 138 137 140  K 3.9 4.9 4.7 4.5  CL 107 106 109 112*  CO2 26 26 24 23   GLUCOSE 141* 110* 170* 101*  BUN 15 18 13 15   CREATININE 0.77 0.64 0.57 0.64  CALCIUM 8.9 8.9 8.1* 8.0*  GFRNONAA >60 >60 >60 >60  GFRAA >60 >60 >60 >60  PROT 7.1 7.0  --   --   ALBUMIN 4.3 4.1  --   --   AST 24 17  --   --   ALT 19 14  --   --   ALKPHOS 45 45  --   --   BILITOT 0.5 0.7  --   --     RADIOGRAPHIC STUDIES: I have reviewed store data and imaging study in the PACS system pertaining to CT scan of the abdomen and pelvis I have personally reviewed the radiological images as listed and agreed with the findings in the report.

## 2018-04-13 ENCOUNTER — Telehealth: Payer: Self-pay | Admitting: *Deleted

## 2018-04-13 NOTE — Telephone Encounter (Signed)
Called and spoke with the patient. Scheduled a wound check appt for tomorrow at [facility] the patient to bring a overnight bag in case she is admitted

## 2018-04-14 ENCOUNTER — Encounter: Payer: Self-pay | Admitting: Gynecologic Oncology

## 2018-04-14 ENCOUNTER — Inpatient Hospital Stay (HOSPITAL_BASED_OUTPATIENT_CLINIC_OR_DEPARTMENT_OTHER): Payer: Medicare HMO | Admitting: Gynecologic Oncology

## 2018-04-14 ENCOUNTER — Inpatient Hospital Stay: Payer: Medicare HMO

## 2018-04-14 VITALS — BP 125/85 | HR 86 | Temp 98.2°F | Resp 20 | Ht 64.0 in | Wt 139.9 lb

## 2018-04-14 DIAGNOSIS — C5701 Malignant neoplasm of right fallopian tube: Secondary | ICD-10-CM | POA: Diagnosis not present

## 2018-04-14 DIAGNOSIS — T8149XA Infection following a procedure, other surgical site, initial encounter: Secondary | ICD-10-CM

## 2018-04-14 DIAGNOSIS — C562 Malignant neoplasm of left ovary: Secondary | ICD-10-CM

## 2018-04-14 MED ORDER — CEPHALEXIN 500 MG PO CAPS: 500.0000 mg | ORAL_CAPSULE | Freq: Four times a day (QID) | ORAL | 1 refills | Status: DC

## 2018-04-14 NOTE — Patient Instructions (Signed)
Keep the dressing over the lower portion of your incision clean and dry.  Plan to change the dressing in the morning and in the evening or more if it becomes wet with drainage.  If you develop a fever, chills, redness over the marking lines, or any other symptoms call [phone] over the weekend and ask to speak to Dr. Precious Haws because you recently had surgery and have an infection with your incision and she may want to meet you in the ED. Plan to keep you appointments on Monday.

## 2018-04-16 LAB — AEROBIC CULTURE W GRAM STAIN (SUPERFICIAL SPECIMEN): Culture: NORMAL

## 2018-04-17 ENCOUNTER — Telehealth: Payer: Self-pay | Admitting: Hematology and Oncology

## 2018-04-17 ENCOUNTER — Inpatient Hospital Stay (HOSPITAL_BASED_OUTPATIENT_CLINIC_OR_DEPARTMENT_OTHER): Payer: Medicare HMO | Admitting: Hematology and Oncology

## 2018-04-17 ENCOUNTER — Encounter: Payer: Self-pay | Admitting: Hematology and Oncology

## 2018-04-17 ENCOUNTER — Encounter: Payer: Self-pay | Admitting: Gynecologic Oncology

## 2018-04-17 ENCOUNTER — Inpatient Hospital Stay: Payer: Medicare HMO

## 2018-04-17 VITALS — BP 137/88 | HR 80 | Temp 98.2°F | Resp 17 | Ht 64.0 in | Wt 140.5 lb

## 2018-04-17 DIAGNOSIS — G893 Neoplasm related pain (acute) (chronic): Secondary | ICD-10-CM | POA: Diagnosis not present

## 2018-04-17 DIAGNOSIS — C786 Secondary malignant neoplasm of retroperitoneum and peritoneum: Secondary | ICD-10-CM | POA: Diagnosis not present

## 2018-04-17 DIAGNOSIS — T8149XA Infection following a procedure, other surgical site, initial encounter: Secondary | ICD-10-CM

## 2018-04-17 DIAGNOSIS — C562 Malignant neoplasm of left ovary: Secondary | ICD-10-CM | POA: Diagnosis not present

## 2018-04-17 DIAGNOSIS — C5701 Malignant neoplasm of right fallopian tube: Secondary | ICD-10-CM

## 2018-04-17 NOTE — Assessment & Plan Note (Signed)
From the adjuvant treatment standpoint, the treatment is placed on hold due to unresolved wound infection. She will be seen next week by GYN oncologist for further assessment. As soon as her wound clears up, I will order CT imaging to establish new baseline along with port placement

## 2018-04-17 NOTE — Assessment & Plan Note (Signed)
Pain control is stable.  She will continue oxycodone as prescribed

## 2018-04-17 NOTE — Telephone Encounter (Signed)
Per 12/23 no new orders

## 2018-04-17 NOTE — Progress Notes (Signed)
Patient was seen by me at the request of Dr Alvy Bimler to evaluate wound.  Lower incision remains beefy red in surrounding skin. Less tender after 100cc drawn off the following week, but redness has not receded from marked line.  I probed the incsion with a cue tip and opened it slightly. 100cc amber clear fluid came out. Probed the incision to a 3-4inch depth. Injected with 1% lidocaine and extended the incision some more. This allowed debridement of the cavity with guaze and complete evacuation of fluid. Packed wound with iodoform gauze and instructed patient to do this BID.  Will see her back in approximately 1 week to re-evaluate.   Thereasa Solo, MD

## 2018-04-17 NOTE — Progress Notes (Signed)
S: Patient to the office for incision assessment.  She called the office yesterday stating the incision "just did not feel right."  Today she reporting the incision/lower abdomen feeling heavy. Intermittent twinges of pain in the lower abdomen.  Minimal drainage reported. No fever or chills reported.  No other concerns voiced.  Husband present.  O: Alert, oriented x 3.  See media for photo of incision.  Erythema darker red from previous assessment.  Erythema has not passed markings from previous assessment.  Erythema blanching. No drainage present at this time.  Lower portion of incision fluctuance.  Dr. Gerarda Fraction to the room to assess if admission needed at this time.  With patient's permission, betadine applied to the lower aspect of the incision.  An 18 gauge needle with syringe was placed in the area of fluctuance by Dr. Gerarda Fraction.  Patient tolerated well.  25 cc of serosanguinous drainage evacuated and sent for culture.  No thick, pus-like drainage appreciated.  Patient reporting some relief. Dry dressing applied.  A: Post-operative incisional cellulitis.    P: 77 yr old s/p exploratory laparotomy with bilateral salpingo-oophorectomy, omentectomy radical tumor debulking for ovarian cancer, ventral hernia repair on 03/21/18 by Dr. Everitt Amber.  Fluid obtained from the incision sent for culture and incision swabbed for culture. Patient is to continue taking Cipro and new prescription for keflex sent in to take with the Cipro.  Admission not necessary at this time but patient and husband made aware of reportable signs and symptoms and with the contact information since Dr. Gerarda Fraction is on call this weekend.  Advised to monitor for any new symptoms such as fever, chills, pus like drainage, increased erythema over the borders marked.  She is advised to follow up with Dr. Alvy Bimler on Monday, Dec 23 and we would assess her incision at that time as well.  No concerns voiced at the end of the visit.

## 2018-04-17 NOTE — Progress Notes (Signed)
[facility] OFFICE PROGRESS NOTE  Patient Care Team: Allie Dimmer, MD as PCP - General (Internal Medicine)  ASSESSMENT & PLAN:  Fallopian tube cancer, carcinoma, right ([place]) From the adjuvant treatment standpoint, the treatment is placed on hold due to unresolved wound infection. She will be seen next week by GYN oncologist for further assessment. As soon as her wound clears up, I will order CT imaging to establish new baseline along with port placement  Cancer associated pain Pain control is stable.  She will continue oxycodone as prescribed  Wound infection after surgery With her permission, I have taken pictures.  She was assessed by GYN oncologist and will do some wound care at home.  I will defer to them for further management   No orders of the defined types were placed in this encounter.   INTERVAL HISTORY: Please see below for problem oriented charting. She returns with her husband for further follow-up.  She has attended chemo education class Since last time she was seen, her wound infection is stable.  With her permission, I have taken some pictures.  She is still receiving oral antibiotics She denies fever or chills.  Her appetite is stable. 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      Fallopian tube  cancer, carcinoma, right ([place])   10/24/2017 Initial Diagnosis    She has presentation of vague abdominal pain    02/20/2018 Imaging    Outside CT abdomen and pelvis: This revealed an incidental finding of a 1.5 cm subcapsular lesion in the lateral upper pole of the left kidney which measured higher than fluid attenuation which could represent a proteinaceous renal cyst or solid renal mass.  There was no ascites.  There is no lymphadenopathy.  There were no masses in the pelvis including ovarian or adnexal masses seen.  There was however soft tissue stranding seen with nodularity in the omental fat in the lower abdomen without evidence of ascites.  This was felt to represent either carcinomatosis or inflammatory infectious etiologies.  There was colonic diverticulosis seen without radiographic evidence of diverticulitis.  The radiologist recommended MRI of the abdomen to further work-up the renal mass    02/22/2018 Tumor Marker    Patient's tumor was tested for the following markers: CA-125. Results of the tumor marker test revealed 103.    03/06/2018 Pathology Results    Omentum, biopsy - ADENOCARCINOMA WITH PSAMMOMA BODIES, SEE COMMENT. Microscopic Comment There are scattered small foci of malignant glands with associated psammoma bodies. While the tumor is somewhat limited the cells have a more low grade appearance. There is prominent lymphovascular invasion. Immunohistochemistry reveals the cells are positive for cytokeratin 7, ER, MOC31, PAX8, and WT-1. They are negative for calretinin, cytokeratin 20, CDX-2, and TTF-1. Overall, the findings are consistent with  adenocarcinoma. The differential includes a primary gynecologic or peritoneal tumor, although the morphology is not typical of a high grade serous carcinoma.    03/06/2018 Surgery    Surgeon: Donaciano Eva   Pre-operative Diagnosis: omental mass, elevated CA 125 Operation: Laparoscopic omentectomy  Surgeon: Donaciano Eva  Operative Findings:  : normal diaphragm and upper omentum. Sigmoid colon densely adherent to left pelvic side wall consistent with history of diverticulitis. Omentum adherent to anterior abdominal wall and sigmoid colon. Surgically absent uterus. Right ovary very small and grossly normal. Left ovary obscured by adhesed sigmoid colon. No ascites. No carcinomatosis. There was a nodular firm distal omentum on the left which was adherent to the sigmoid colon and most consistent with inflammatory change.       03/21/2018 Pathology Results    1. Soft tissue mass, simple excision, midline abdominal wall mass - METASTATIC SEROUS CARCINOMA. 2. Omentum, resection for tumor - METASTATIC SEROUS CARCINOMA. 3. Adnexa - ovary +/- tube, neoplastic, right - RIGHT FALLOPIAN TUBE: -HIGH GRADE SEROUS CARCINOMA. -SEE ONCOLOGY TABLE. - RIGHT OVARY: SURFACE PSAMMOMA BODIES. NO DEFINITIVE MALIGNANT CELLS. -INCLUSION CYSTS. 4. Adnexa - ovary +/- tube, neoplastic, left - LEFT FALLOPIAN TUBE: LUMINAL AND SURFACE SEROUS CARCINOMA. - LEFT OVARY: FOCAL PSAMMOMA BODIES AND MALIGNANT CELLS. 5. Soft tissue mass, simple excision, left abdominal wall - METASTATIC SEROUS CARCINOMA. Microscopic Comment 3. OVARY or FALLOPIAN TUBE or PRIMARY PERITONEUM: Procedure: Bilateral salpingo-oophorectomy. Abdominal wall mass excision and omentectomy. Specimen Integrity: Intact. Tumor Site: Right fallopian tube. Ovarian Surface Involvement (required only if applicable): Present (left). Fallopian Tube Surface Involvement (required only if applicable): Present. Tumor Size: 1.5 cm. Histologic Type: High grade serous carcinoma. Histologic Grade: High grade. Implants (required for advanced stage serous/seromucinous borderline tumors only): Present. Other Tissue/ Organ Involvement: Omentum, abdominal wall, left fallopian tube and ovary. Largest Extrapelvic Peritoneal Focus (required only if applicable): >2  cm. Peritoneal/Ascitic Fluid: N/A. Treatment Effect (required only for high-grade serous carcinomas): N/A. Regional Lymph Nodes: No lymph nodes submitted or found Pathologic Stage Classification (pTNM, AJCC 8th Edition): pT3c, pNX Representative Tumor Block: 3A Comment(s): None.    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   Surgeon: Thereasa Solo, MD. Specimens: Bilateral tubes / ovaries, omentum. Midline abdominal wall mass, left lateral abdominal wall mass.    Operative Findings:  Abdominal wall masses consistent with port site metastases at the umbilical incision (7cm) and left lateral incision (5cm). Omental caking in the infracolic omentum. Grossly normal very small tubes and ovaries. Normal diaphragms. No lymphadenopathy. Normal small and large intestine with the exception of miliary studding of tumor on the surface of the sigmoid colon and rectum in the cul de sac.    This represented an optimal cytoreduction (R1) with no gross visible disease remaining, however there was a thin rind of tumor on the lateral left fascia associated with the port site metastasis.      04/11/2018 Cancer Staging    Staging form: Ovary, Fallopian Tube, and Primary Peritoneal Carcinoma, AJCC 8th Edition - Pathologic: FIGO Stage IIIC (pT3, pN0, cM0) - Signed by [name], MD on 04/11/2018     REVIEW OF SYSTEMS:   Constitutional: Denies fevers, chills or abnormal weight loss Ears, nose, mouth, throat, and face: Denies mucositis or sore throat Respiratory: Denies cough, dyspnea or wheezes Cardiovascular: Denies palpitation, chest  discomfort or lower extremity swelling Gastrointestinal:  Denies nausea, heartburn or change in bowel habits Lymphatics: Denies new lymphadenopathy or  easy bruising Neurological:Denies numbness, tingling or new weaknesses Behavioral/Psych: Mood is stable, no new changes  All other systems were reviewed with the patient and are negative.  I have reviewed the past medical history, past surgical history, social history and family history with the patient and they are unchanged from previous note.  ALLERGIES:  is allergic to ivp dye [iodinated diagnostic agents]; propoxyphene; codeine; and ultram [tramadol hcl].  MEDICATIONS:  Current Outpatient Medications  Medication Sig Dispense Refill  . carboxymethylcellulose (REFRESH PLUS) 0.5 % SOLN Place 1 drop into both eyes 3 (three) times daily as needed (for dry eyes.).    Marland Kitchen Cholecalciferol (VITAMIN D3 SUPER STRENGTH) 50 MCG (2000 UT) TABS Take 2,000 Units by mouth daily.    . ciprofloxacin (CIPRO) 500 MG tablet Take 1 tablet (500 mg total) by mouth 2 (two) times daily. 20 tablet 0  . clindamycin (CLINDAGEL) 1 % gel Apply 1 application topically 2 (two) times daily.    . furosemide (LASIX) 40 MG tablet Take by mouth.    Marland Kitchen ibuprofen (ADVIL,MOTRIN) 600 MG tablet Take 1 tablet (600 mg total) by mouth every 6 (six) hours as needed. 30 tablet 0  . magnesium oxide (MAG-OX) 400 MG tablet Take by mouth.    . oxyCODONE (OXY IR/ROXICODONE) 5 MG immediate release tablet Take 1 tablet (5 mg total) by mouth every 4 (four) hours as needed for severe pain. 30 tablet 0  . potassium chloride SA (K-DUR,KLOR-CON) 20 MEQ tablet Take by mouth.    . senna-docusate (SENOKOT-S) 8.6-50 MG tablet Take 2 tablets by mouth at bedtime. Hold if having loose stools 60 tablet 1  . traZODone (DESYREL) 50 MG tablet Take 50 mg by mouth at bedtime as needed for sleep.   0  . triamcinolone cream (KENALOG) 0.5 % Apply 1 application topically 2 (two) times daily. Apply to eyelids/eyebrows  4   No current facility-administered medications for this visit.     PHYSICAL EXAMINATION: ECOG PERFORMANCE STATUS: 2 - Symptomatic, <50% confined  to bed  Vitals:   04/17/18 1419  BP: 137/88  Pulse: 80  Resp: 17  Temp: 98.2 F (36.8 C)  SpO2: 99%   Filed Weights   04/17/18 1419  Weight: 140 lb 8 oz (63.7 kg)      GENERAL:alert, no distress and comfortable SKIN: skin color, texture, turgor are normal, no rashes or significant lesions EYES: normal, Conjunctiva are pink and non-injected, sclera clear OROPHARYNX:no exudate, no erythema and lips, buccal mucosa, and tongue normal  NECK: supple, thyroid normal size, non-tender, without nodularity LYMPH:  no palpable lymphadenopathy in the cervical, axillary or inguinal LUNGS: clear to auscultation and percussion with normal breathing effort HEART: regular rate & rhythm and no murmurs and no lower extremity edema ABDOMEN:abdomen soft, mild tenderness at the incision site.  Hernia is palpated.  Significant erythema noted Musculoskeletal:no cyanosis of digits and no clubbing  NEURO: alert & oriented x 3 with fluent speech, no focal motor/sensory deficits  LABORATORY DATA:  I have reviewed the data as listed    Component Value Date/Time   NA 140 03/23/2018 0459   K 4.5 03/23/2018 0459   CL 112 (H) 03/23/2018 0459   CO2 23 03/23/2018 0459   GLUCOSE 101 (H) 03/23/2018 0459   BUN 15 03/23/2018 0459   CREATININE 0.64 03/23/2018 0459   CALCIUM 8.0 (L) 03/23/2018 0459  PROT 7.0 03/15/2018 0812   ALBUMIN 4.1 03/15/2018 0812   AST 17 03/15/2018 0812   ALT 14 03/15/2018 0812   ALKPHOS 45 03/15/2018 0812   BILITOT 0.7 03/15/2018 0812   GFRNONAA >60 03/23/2018 0459   GFRAA >60 03/23/2018 0459    No results found for: SPEP, UPEP  Lab Results  Component Value Date   WBC 14.0 (H) 03/23/2018   NEUTROABS 5.3 02/28/2018   HGB 10.2 (L) 03/23/2018   HCT 33.3 (L) 03/23/2018   MCV 92.5 03/23/2018   PLT 186 03/23/2018      Chemistry      Component Value Date/Time   NA 140 03/23/2018 0459   K 4.5 03/23/2018 0459   CL 112 (H) 03/23/2018 0459   CO2 23 03/23/2018 0459   BUN  15 03/23/2018 0459   CREATININE 0.64 03/23/2018 0459      Component Value Date/Time   CALCIUM 8.0 (L) 03/23/2018 0459   ALKPHOS 45 03/15/2018 0812   AST 17 03/15/2018 0812   ALT 14 03/15/2018 0812   BILITOT 0.7 03/15/2018 0812       All questions were answered. The patient knows to call the clinic with any problems, questions or concerns. No barriers to learning was detected.  I spent 15 minutes counseling the patient face to face. The total time spent in the appointment was 20 minutes and more than 50% was on counseling and review of test results  [name], MD 04/17/2018 3:25 PM

## 2018-04-17 NOTE — Assessment & Plan Note (Signed)
With her permission, I have taken pictures.  She was assessed by GYN oncologist and will do some wound care at home.  I will defer to them for further management

## 2018-04-21 LAB — AEROBIC/ANAEROBIC CULTURE (SURGICAL/DEEP WOUND)

## 2018-04-21 LAB — AEROBIC/ANAEROBIC CULTURE W GRAM STAIN (SURGICAL/DEEP WOUND)

## 2018-04-28 ENCOUNTER — Inpatient Hospital Stay: Payer: Medicare HMO | Attending: Gynecologic Oncology | Admitting: Gynecologic Oncology

## 2018-04-28 VITALS — BP 151/80 | HR 82 | Temp 98.0°F | Resp 18 | Ht 64.0 in | Wt 140.0 lb

## 2018-04-28 DIAGNOSIS — T8149XS Infection following a procedure, other surgical site, sequela: Secondary | ICD-10-CM

## 2018-04-28 DIAGNOSIS — Z79899 Other long term (current) drug therapy: Secondary | ICD-10-CM | POA: Insufficient documentation

## 2018-04-28 DIAGNOSIS — T8141XS Infection following a procedure, superficial incisional surgical site, sequela: Secondary | ICD-10-CM | POA: Insufficient documentation

## 2018-04-28 DIAGNOSIS — Z90710 Acquired absence of both cervix and uterus: Secondary | ICD-10-CM | POA: Insufficient documentation

## 2018-04-28 DIAGNOSIS — C5701 Malignant neoplasm of right fallopian tube: Secondary | ICD-10-CM | POA: Insufficient documentation

## 2018-04-28 DIAGNOSIS — Z791 Long term (current) use of non-steroidal anti-inflammatories (NSAID): Secondary | ICD-10-CM | POA: Insufficient documentation

## 2018-04-28 DIAGNOSIS — Z90722 Acquired absence of ovaries, bilateral: Secondary | ICD-10-CM | POA: Insufficient documentation

## 2018-04-28 DIAGNOSIS — R109 Unspecified abdominal pain: Secondary | ICD-10-CM | POA: Insufficient documentation

## 2018-04-28 DIAGNOSIS — T8141XA Infection following a procedure, superficial incisional surgical site, initial encounter: Secondary | ICD-10-CM | POA: Insufficient documentation

## 2018-04-28 DIAGNOSIS — T8149XA Infection following a procedure, other surgical site, initial encounter: Secondary | ICD-10-CM

## 2018-04-28 DIAGNOSIS — G893 Neoplasm related pain (acute) (chronic): Secondary | ICD-10-CM | POA: Insufficient documentation

## 2018-04-28 DIAGNOSIS — C561 Malignant neoplasm of right ovary: Secondary | ICD-10-CM | POA: Insufficient documentation

## 2018-04-28 NOTE — Progress Notes (Signed)
S: Patient presents to office today with her husband for wound assessment.  She reports doing well since her last visit.  She has been changing the dressing twice daily and feels the amount of packing needed in the incision is less than half it was when she first started. No fever or chills reported.  Still reports intermittent heaviness when ambulating in the lower abdomen. No other concerns voiced.   O: Pt alert, oriented x 3. Erythema to the lower abdomen much improved.  Packing removed with minimal serosanguinous drainage noted on the strip. Wound clean. No evidence of active infection at this time. See media for photo. Wound measuring 3 cm in depth. Wound packed with around 20 cm of iodoform packing strip. Dry dressing applied.   A: Post-operative wound cellulitis with seroma  P: Wound improving and infection resolved. Patient advised to continue with twice daily dressing changes.  Supplies given.  She is to see the genetics counselor on Tuesday.  Dr. Calton Dach office will be contacting her with an appointment in around 10 days for follow up.  Reportable signs and symptoms reviewed.  She is to call for any needs or concerns.

## 2018-04-28 NOTE — Patient Instructions (Signed)
We will contact you with an appointment to meet with Dr. Alvy Bimler in around 10 days.  Plan to continue twice daily dressing changes.

## 2018-05-02 ENCOUNTER — Inpatient Hospital Stay: Payer: Medicare HMO

## 2018-05-02 ENCOUNTER — Inpatient Hospital Stay (HOSPITAL_BASED_OUTPATIENT_CLINIC_OR_DEPARTMENT_OTHER): Payer: Medicare HMO | Admitting: Genetics

## 2018-05-02 DIAGNOSIS — C562 Malignant neoplasm of left ovary: Secondary | ICD-10-CM

## 2018-05-02 DIAGNOSIS — Z801 Family history of malignant neoplasm of trachea, bronchus and lung: Secondary | ICD-10-CM | POA: Diagnosis not present

## 2018-05-02 DIAGNOSIS — C5701 Malignant neoplasm of right fallopian tube: Secondary | ICD-10-CM

## 2018-05-02 DIAGNOSIS — C569 Malignant neoplasm of unspecified ovary: Secondary | ICD-10-CM | POA: Diagnosis not present

## 2018-05-02 DIAGNOSIS — Z807 Family history of other malignant neoplasms of lymphoid, hematopoietic and related tissues: Secondary | ICD-10-CM

## 2018-05-03 ENCOUNTER — Telehealth: Payer: Self-pay | Admitting: *Deleted

## 2018-05-03 ENCOUNTER — Encounter: Payer: Self-pay | Admitting: Genetics

## 2018-05-03 ENCOUNTER — Telehealth: Payer: Self-pay | Admitting: Hematology and Oncology

## 2018-05-03 DIAGNOSIS — Z807 Family history of other malignant neoplasms of lymphoid, hematopoietic and related tissues: Secondary | ICD-10-CM | POA: Insufficient documentation

## 2018-05-03 DIAGNOSIS — Z801 Family history of malignant neoplasm of trachea, bronchus and lung: Secondary | ICD-10-CM | POA: Insufficient documentation

## 2018-05-03 NOTE — Telephone Encounter (Signed)
Patient called and stated "I'm having either a reaction to the tape or my wound is getting infected. The area around it is very itchy, burns and is red. I need to have someone look at it." Scheduled the patient to see [facility] tomorrow

## 2018-05-03 NOTE — Progress Notes (Signed)
REFERRING PROVIDER: [name], MD [address] [place], [place] [address]  PRIMARY PROVIDER:  Allie Dimmer, MD  PRIMARY REASON FOR VISIT:  1. Fallopian tube cancer, carcinoma, right ([place])   2. Family history of lymphoma   3. Family history of lung cancer   4. Malignant neoplasm of ovary, unspecified laterality ([place])   5. 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 female, was seen for a [facility] cancer genetics consultation at the request of Dr. Alvy Bimler due to a personal history of cancer.  Misty Hopkins presents to clinic today to discuss the possibility of a hereditary predisposition to cancer, genetic testing, and to further clarify her future cancer risks, as well as potential cancer risks for family members.   In 2019, at the age of 57, Misty Hopkins was diagnosed with ovarian/fallopian tube cancer. She had a laparoscopic omentectomy on 03/06/18 and exploratory laparotomy 03/21/18.    CANCER HISTORY:  Oncology History   High grade serous     Ovarian cancer ([place])   03/21/2018 Initial Diagnosis    Ovarian cancer ([place])    04/28/2018 -  Chemotherapy    The patient had PALONOSETRON HCL INJECTION 0.25 MG/5ML, 0.25 mg, Intravenous,  Once, 0 of 6 cycles CARBOplatin (PARAPLATIN) in sodium chloride 0.9 % 100 mL chemo infusion, , Intravenous,  Once, 0 of 6 cycles PACLitaxel (TAXOL) 300 mg in sodium chloride 0.9 % 250 mL chemo infusion (> 69m/m2), 175 mg/m2, Intravenous,  Once, 0 of 6 cycles FOSAPREPITANT 150MG + DEXAMETHASONE INFUSION [facility], , Intravenous,  Once, 0 of 6 cycles  for chemotherapy treatment.      Fall[place] cancer, carcinoma, right [place]   10/24/2017 Initial Diagnosis    She has presentation of vague abdominal pain    02/20/2018 Imaging    Outside CT abdomen and pelvis: This revealed an incidental finding of a 1.5 cm subcapsular lesion in the lateral upper pole of the left kidney which measured higher than  fluid attenuation which could represent a proteinaceous renal cyst or solid renal mass.  There was no ascites.  There is no lymphadenopathy.  There were no masses in the pelvis including ovarian or adnexal masses seen.  There was however soft tissue stranding seen with nodularity in the omental fat in the lower abdomen without evidence of ascites.  This was felt to represent either carcinomatosis or inflammatory infectious etiologies.  There was colonic diverticulosis seen without radiographic evidence of diverticulitis.  The radiologist recommended MRI of the abdomen to further work-up the renal mass    02/22/2018 Tumor Marker    Patient's tumor was tested for the following markers: CA-125. Results of the tumor marker test revealed 103.    03/06/2018 Pathology Results    Omentum, biopsy - ADENOCARCINOMA WITH PSAMMOMA BODIES, SEE COMMENT. Microscopic Comment There are scattered small foci of malignant glands with associated psammoma bodies. While the tumor is somewhat limited the cells have a more low grade appearance. There is prominent lymphovascular invasion. Immunohistochemistry reveals the cells are positive for cytokeratin 7, ER, MOC31, PAX8, and WT-1. They are negative for calretinin, cytokeratin 20, CDX-2, and TTF-1. Overall, the findings are consistent with adenocarcinoma. The differential includes a primary gynecologic or peritoneal tumor, although the morphology is not typical of a high grade serous carcinoma.    03/06/2018 Surgery    Surgeon: RDonaciano Eva  Pre-operative Diagnosis: omental mass, elevated CA 125 Operation: Laparoscopic omentectomy  Surgeon: Donaciano Eva  Operative Findings:  : normal diaphragm and upper omentum. Sigmoid colon densely adherent to left pelvic side wall consistent with history of diverticulitis. Omentum adherent to anterior abdominal wall and sigmoid colon. Surgically absent uterus. Right ovary very small and grossly normal. Left  ovary obscured by adhesed sigmoid colon. No ascites. No carcinomatosis. There was a nodular firm distal omentum on the left which was adherent to the sigmoid colon and most consistent with inflammatory change.       03/21/2018 Pathology Results    1. Soft tissue mass, simple excision, midline abdominal wall mass - METASTATIC SEROUS CARCINOMA. 2. Omentum, resection for tumor - METASTATIC SEROUS CARCINOMA. 3. Adnexa - ovary +/- tube, neoplastic, right - RIGHT FALLOPIAN TUBE: -HIGH GRADE SEROUS CARCINOMA. -SEE ONCOLOGY TABLE. - RIGHT OVARY: SURFACE PSAMMOMA BODIES. NO DEFINITIVE MALIGNANT CELLS. -INCLUSION CYSTS. 4. Adnexa - ovary +/- tube, neoplastic, left - LEFT FALLOPIAN TUBE: LUMINAL AND SURFACE SEROUS CARCINOMA. - LEFT OVARY: FOCAL PSAMMOMA BODIES AND MALIGNANT CELLS. 5. Soft tissue mass, simple excision, left abdominal wall - METASTATIC SEROUS CARCINOMA. Microscopic Comment 3. OVARY or FALLOPIAN TUBE or PRIMARY PERITONEUM: Procedure: Bilateral salpingo-oophorectomy. Abdominal wall mass excision and omentectomy. Specimen Integrity: Intact. Tumor Site: Right fallopian tube. Ovarian Surface Involvement (required only if applicable): Present (left). Fallopian Tube Surface Involvement (required only if applicable): Present. Tumor Size: 1.5 cm. Histologic Type: High grade serous carcinoma. Histologic Grade: High grade. Implants (required for advanced stage serous/seromucinous borderline tumors only): Present. Other Tissue/ Organ Involvement: Omentum, abdominal wall, left fallopian tube and ovary. Largest Extrapelvic Peritoneal Focus (required only if applicable): >2 cm. Peritoneal/Ascitic Fluid: N/A. Treatment Effect (required only for high-grade serous carcinomas): N/A. Regional Lymph Nodes: No lymph nodes submitted or found Pathologic Stage Classification (pTNM, AJCC 8th Edition): pT3c, pNX Representative Tumor Block: 3A Comment(s): None.    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   Surgeon: Thereasa Solo, MD. Specimens: Bilateral tubes / ovaries, omentum. Midline abdominal wall mass, left lateral abdominal wall mass.    Operative Findings:  Abdominal wall masses consistent with port site metastases at the umbilical incision (7cm) and left lateral incision (5cm). Omental caking in the infracolic omentum. Grossly normal very small tubes and ovaries. Normal diaphragms. No lymphadenopathy. Normal small and large intestine with the exception of miliary studding of tumor on the surface of the sigmoid colon and rectum in the cul de sac.    This represented an optimal cytoreduction (R1) with no gross visible disease remaining, however there was a thin rind of tumor on the lateral left fascia associated with the port site metastasis.      04/11/2018 Cancer Staging    Staging form: Ovary, Fallopian Tube, and Primary Peritoneal Carcinoma, AJCC 8th Edition - Pathologic: FIGO Stage IIIC (pT3, pN0, cM0) - Signed by [name], MD on 04/11/2018      Past Medical History:  Diagnosis Date  . Complication of anesthesia    Difficult to put to sleep  . Family history of lung cancer   . Family history of lymphoma   . GERD (gastroesophageal reflux disease)     Past Surgical History:  Procedure Laterality Date  . Bladder Lift  2000  . DEBULKING N/A 03/21/2018   Procedure: RADICAL TUMOR DEBULKING;  Surgeon: Everitt Amber, MD;  Location: [facility];  Service: Gynecology;  Laterality: N/A;  . EYE SURGERY  2017   Cataracts (Bilateral)  . LAPAROSCOPIC SALPINGO OOPHERECTOMY Bilateral 03/21/2018   Procedure: SALPINGO OOPHORECTOMY;  Surgeon: Everitt Amber, MD;  Location: [facility];  Service: Gynecology;  Laterality: Bilateral;  . LAPAROSCOPY N/A  03/06/2018   Procedure: LAPAROSCOPY DIAGNOSTIC OMENTAL BIOPSY;  Surgeon: Everitt Amber, MD;  Location: [facility];  Service: Gynecology;  Laterality: N/A;  . LAPAROTOMY N/A 03/21/2018   Procedure: EXPLORATORY LAPAROTOMY, INCISIONAL HERNIA REPAIR;  Surgeon: Everitt Amber, MD;  Location: [facility];  Service: Gynecology;  Laterality: N/A;  . OMENTECTOMY N/A 03/21/2018   Procedure: OMENTECTOMY;  Surgeon: Everitt Amber, MD;  Location: [facility];  Service: Gynecology;  Laterality: N/A;  . PARTIAL HYSTERECTOMY     40 years ago  . Rectum Lift  2001  . TONSILLECTOMY      Social History   Socioeconomic History  . Marital status: Married    Spouse name: Not on file  . Number of children: Not on file  . Years of education: Not on file  . Highest education level: Not on file  Occupational History  . Not on file  Social Needs  . Financial resource strain: Not on file  . Food insecurity:    Worry: Not on file    Inability: Not on file  . Transportation needs:    Medical: Not on file    Non-medical: Not on file  Tobacco Use  . Smoking status: Never Smoker  . Smokeless tobacco: Never Used  Substance and Sexual Activity  . Alcohol use: Never    Frequency: Never  . Drug use: Never  . Sexual activity: Not on file  Lifestyle  . Physical activity:    Days per week: Not on file    Minutes per session: Not on file  . Stress: Not on file  Relationships  . Social connections:    Talks on phone: Not on file    Gets together: Not on file    Attends religious service: Not on file    Active member of club or organization: Not on file    Attends meetings of clubs or organizations: Not on file    Relationship status: Not on file  Other Topics Concern  . Not on file  Social History Narrative  . Not on file     FAMILY HISTORY:  We obtained a detailed, 4-generation family history.  Significant diagnoses are listed below: Family History  Problem Relation Age of Onset  . Diabetes Mother    . Stroke Mother   . Non-Hodgkin's lymphoma Brother 65       exp to agent orange  . Heart disease Maternal Grandmother   . Lung cancer Maternal Grandfather   . Tuberculosis Paternal Grandmother     Ms. Stooksbury has a 59 year-old son who has no hx of cancer.  She has 2 grandchildren ages 49 and 36.  Ms. Cliff has 2 sister ages 38 and 13 alive with no hx of cancer. She has 1 brother who was dx with lymphoma at 31 and died at 72.  He was exposed to agent orange.   Ms. Karp father: no hx of cancer.  Paternal Aunts/Uncles: 6 full aunts/uncles, 8 half aunts/uncles.  2 additional aunts/uncles who died as babies.  No known hx of cancer in these relatives, she has limited info about the half aunts/uncles.  Paternal cousins: no known hx of cancer, but limited information available about these relatives.  Paternal grandfather: deceased, no known  hx of cancer.  Paternal grandmother:died young due to TB.  Ms. Jetter mother: died at 1 due to stroke and Diabetes.  Maternal Aunts/Uncles: 2 maternal aunts, 3 maternal uncles, no hx of cancer.  Maternal cousins: no known hx of cancer.  Maternal grandfather: died with lung cancer.  Maternal grandmother:died at 53, no hx of cancer. Heart disease.   Ms. Weidemann is unaware of previous family history of genetic testing for hereditary cancer risks. Patient's maternal ancestors are of Caucasian/Native American descent, and paternal ancestors are of Caucasian descent. There is no is no reported Ashkenazi Jewish ancestry. There  known consanguinity.  GENETIC COUNSELING ASSESSMENT: Deneane Stifter is a 78 y.o. female with a is no which is somewhat suggestive of a Hereditary Cancer Predisposition Syndrome. We, therefore, discussed and recommended the following at today's visit.   DISCUSSION: We reviewed the characteristics, features and inheritance patterns of hereditary cancer syndromes. We also discussed genetic testing, including the appropriate  family members to test, the process of testing, insurance coverage and turn-around-time for results. We discussed the implications of a negative, positive and/or variant of uncertain significant result. We recommended Ms. Terris pursue genetic testing for [facility] gene panel + HRD tumor testing.   The [facility] Series gene panel offered by [employer] includes sequencing and deletion/duplication testing of the following 35 genes: APC, ATM, BARD1, BMPR1A, BRCA1, BRCA2, BRIP1, CHD1, CDK4, CDKN2A, CHEK2, EPCAM (large rearrangement only), GREM1, HOXB13, AXIN2, MSH3, NTHL1, RNF43, GALNT12, RSP20, MLH1, MSH2, MSH6, MUTYH, NBN, PALB2, PMS2, PTEN, RAD51C, RAD51D, SMAD4, STK11, and TP53. Sequencing was performed for select regions of POLE and POLD1, and large rearrangement analysis was performed for select regions of GREM1.  We discussed that genetic testing through [facility] will test for hereditary mutations that could explain her diagnosis of cancer.  However, homologous recombination testing (HRD) is genetic testing performed on her tumor that can determine genetic changes that could influence her management.  HRD testing is performed in tandem with genetic testing, and typically at no additional cost.   We discussed that about 20-25% of ovarian cancers are associated with a Hereditary cancer predisposition syndrome.  One of the most common hereditary cancer syndromes that increases ovarian cancer risk is called Hereditary Breast and Ovarian Cancer (HBOC) syndrome.  This syndrome is caused by mutations in the BRCA1 and BRCA2 genes.  This syndrome increases an individual's lifetime risk to develop breast, ovarian, pancreatic, and other types of cancer.  There are also many other cancer predisposition syndromes caused by mutations in several other genes.  We discussed that if she is found to have a mutation in one of these genes, it may impact future medical management recommendations such as increased cancer  screenings and consideration of risk reducing surgeries.  A positive result could also have implications for the patient's family members.In some cases, a positive result can also impact treatments options (Ex: PARP-inhibitor).   A Negative result would mean we did not identify a hereditary component to her cancer, but does not rule out the possibility of a hereditary basis for her cancer.  There could be mutations that are undetectable by current technology, or in genes not yet tested or identified to increase cancer risk.    We discussed the potential to find a Variant of Uncertain Significance or VUS.  These are variants that have not yet been identified as pathogenic or benign, and it is unknown if this variant is associated with increased cancer risk or if this is a normal finding.  Most VUS's are reclassified to benign or likely benign.   It should not be used to make medical management decisions. With time, we suspect the lab will determine the significance of any VUS's identified if any.   Based on Ms. Prouse's personal history of cancer, she meets medical criteria for genetic testing. Despite that she meets criteria, she may still have an out of pocket cost. The laboratory can provide her with an estimate of her OOP cost.  she was given the contact information for the laboratory if she has further questions. Marland Kitchen   PLAN: After considering the risks, benefits, and limitations, Ms. Bills  provided informed consent to pursue genetic testing and the blood sample was sent to [facility] for analysis of the [facility] Panel + [facility] HRD. Results should be available within approximately 3-8 weeks' time, at which point they will be disclosed by telephone to Ms. Zeigler, as will any additional recommendations warranted by these results. Ms. Huyett will receive a summary of her genetic counseling visit and a copy of her results once available. This information will also be available in Epic. We  encouraged Ms. Lamartina to remain in contact with cancer genetics annually so that we can continuously update the family history and inform her of any changes in cancer genetics and testing that may be of benefit for her family. Ms. Schmelzer questions were answered to her satisfaction today. Our contact information was provided should additional questions or concerns arise.  Lastly, we encouraged Ms. Ross to remain in contact with cancer genetics annually so that we can continuously update the family history and inform her of any changes in cancer genetics and testing that may be of benefit for this family.   Ms.  Gallacher questions were answered to her satisfaction today. Our contact information was provided should additional questions or concerns arise. Thank you for the referral and allowing [place] to share in the care of your patient.   Tana Felts, MS, [facility] Certified Genetic Counselor .[email or web address] phone: [phone]  The patient was seen for a total of 40 minutes in face-to-face genetic counseling.  The patient was accompanied today by her husband.  This patient was discussed with Drs. Magrinat, Lindi Adie and/or Burr Medico who agrees with the above.

## 2018-05-03 NOTE — Telephone Encounter (Signed)
Spoke with patient re 1/16 f/u.

## 2018-05-04 ENCOUNTER — Telehealth: Payer: Self-pay | Admitting: Gynecologic Oncology

## 2018-05-04 ENCOUNTER — Telehealth: Payer: Self-pay | Admitting: *Deleted

## 2018-05-04 ENCOUNTER — Encounter: Payer: Managed Care, Other (non HMO) | Admitting: Gynecologic Oncology

## 2018-05-04 NOTE — Telephone Encounter (Signed)
Cancel appt per [facility] APP

## 2018-05-04 NOTE — Telephone Encounter (Signed)
Patient called this am stating the redness on the lateral aspects of her incision has improved and she feels it was related to the use of tape from the dressing. She states yesterday she was having burning in the area and redness and kept all tape off with improvement noted this am. No fever or chills.  Appt for today cancelled but she is to call if the erythema/burning returns or for any other concerns and we can always see her today or tomorrow for evaluation.

## 2018-05-05 ENCOUNTER — Telehealth: Payer: Self-pay

## 2018-05-05 NOTE — Telephone Encounter (Signed)
Misty Hopkins states that the redness on the lateral aspects of her incision keeps improving. There is no burning.  She is applying a light coat of triple antibiotic ointment to affected areas. The incision itself looks good.  It is still not fully closed.   A dressing is placed over incision and Misty Hopkins is using her underwear to hold dressing.  Tape can irritate her skin. Misty Hopkins will call if redness increases or she has any concerns or issues.

## 2018-05-11 ENCOUNTER — Inpatient Hospital Stay (HOSPITAL_BASED_OUTPATIENT_CLINIC_OR_DEPARTMENT_OTHER): Payer: Medicare HMO | Admitting: Hematology and Oncology

## 2018-05-11 ENCOUNTER — Encounter: Payer: Self-pay | Admitting: Hematology and Oncology

## 2018-05-11 DIAGNOSIS — T8149XA Infection following a procedure, other surgical site, initial encounter: Secondary | ICD-10-CM

## 2018-05-11 DIAGNOSIS — Z79899 Other long term (current) drug therapy: Secondary | ICD-10-CM | POA: Diagnosis not present

## 2018-05-11 DIAGNOSIS — T8141XS Infection following a procedure, superficial incisional surgical site, sequela: Secondary | ICD-10-CM | POA: Diagnosis present

## 2018-05-11 DIAGNOSIS — C561 Malignant neoplasm of right ovary: Secondary | ICD-10-CM | POA: Diagnosis not present

## 2018-05-11 DIAGNOSIS — T8141XA Infection following a procedure, superficial incisional surgical site, initial encounter: Secondary | ICD-10-CM | POA: Diagnosis not present

## 2018-05-11 DIAGNOSIS — G893 Neoplasm related pain (acute) (chronic): Secondary | ICD-10-CM

## 2018-05-11 DIAGNOSIS — Z791 Long term (current) use of non-steroidal anti-inflammatories (NSAID): Secondary | ICD-10-CM | POA: Diagnosis not present

## 2018-05-11 DIAGNOSIS — R109 Unspecified abdominal pain: Secondary | ICD-10-CM | POA: Diagnosis not present

## 2018-05-11 DIAGNOSIS — C5701 Malignant neoplasm of right fallopian tube: Secondary | ICD-10-CM

## 2018-05-11 DIAGNOSIS — Z90722 Acquired absence of ovaries, bilateral: Secondary | ICD-10-CM | POA: Diagnosis not present

## 2018-05-11 DIAGNOSIS — Z90710 Acquired absence of both cervix and uterus: Secondary | ICD-10-CM | POA: Diagnosis not present

## 2018-05-11 MED ORDER — PREDNISONE 50 MG PO TABS: ORAL_TABLET | ORAL | 0 refills | Status: DC

## 2018-05-11 NOTE — Assessment & Plan Note (Addendum)
Overall, her abdominal wound is slowly healing. I am concerned about possible recurrent of peritoneal disease at the site of surgery that is delaying wound healing I plan to repeat imaging study in 10 days to reassess before we start her on chemotherapy. She understood the rationale of waiting for the wound to close if possible before starting her on treatment Due to reported contrast allergy, we will start her on prednisone to be taken before CT imaging

## 2018-05-11 NOTE — Progress Notes (Signed)
[place] OFFICE PROGRESS NOTE  Patient Care Team: Allie Dimmer, MD as PCP - General (Internal Medicine)  ASSESSMENT & PLAN:  Fallopian tube cancer, carcinoma, right ([place]) Overall, her abdominal wound is slowly healing. I am concerned about possible recurrent of peritoneal disease at the site of surgery that is delaying wound healing I plan to repeat imaging study in 10 days to reassess before we start her on chemotherapy. 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  She will continue the same  Wound infection after surgery The depth of the abdominal wound is getting less, down to about 1.5 cm Pictures taken with the patient's permission to follow   Orders Placed This Encounter  Procedures  . CT CHEST W CONTRAST    Standing Status:   Future    Standing Expiration Date:   05/12/2019    Order Specific Question:   If indicated for the ordered procedure, I authorize the administration of contrast media per Radiology protocol    Answer:   Yes    Order Specific Question:   Preferred imaging location?    Answer:   [facility]    Order Specific Question:   Radiology Contrast Protocol - do NOT remove file path    Answer:   \\[email or web address]  . CT ABDOMEN PELVIS W CONTRAST    Standing Status:   Future    Standing Expiration Date:   05/12/2019    Order Specific Question:   If indicated for the ordered procedure, I authorize the administration of contrast media per Radiology protocol    Answer:   Yes    Order Specific Question:   Preferred imaging location?    Answer:   [facility]    Order Specific Question:   Radiology Contrast Protocol - do NOT remove file path    Answer:    \\[email or web address]  . CBC with Differential/Platelet    Standing Status:   Future    Standing Expiration Date:   06/15/2019  . Comprehensive metabolic panel    Standing Status:   Future    Standing Expiration Date:   06/15/2019  . CA 125    Standing Status:   Future    Standing Expiration Date:   06/15/2019    INTERVAL HISTORY: Please see below for problem oriented charting. She returns with her husband for further follow-up Her abdominal wound is improving She continues to have intermittent abdominal pain, stable with current prescription pain medicine She is still taking laxative therapy No recent nausea No recent fever or chills  SUMMARY OF ONCOLOGIC HISTORY: Oncology History   High grade serous     Ovarian cancer ([place])   03/21/2018 Initial Diagnosis    Ovarian cancer ([place])    04/28/2018 -  Chemotherapy    The patient had PALONOSETRON HCL INJECTION 0.25 MG/5ML, 0.25 mg, Intravenous,  Once, 0 of 6 cycles CARBOplatin (PARAPLATIN) in sodium chloride 0.9 % 100 mL chemo infusion, , Intravenous,  Once, 0 of 6 cycles PACLitaxel (TAXOL) 300 mg in sodium chloride 0.9 % 250 mL chemo infusion (> 80mg /m2), 175 mg/m2, Intravenous,  Once, 0 of 6 cycles FOSAPREPITANT 150MG  + DEXAMETHASONE INFUSION [facility], , Intravenous,  Once, 0 of 6 cycles  for chemotherapy treatment.      Fallopian tube  cancer, carcinoma, right ([place])   10/24/2017 Initial Diagnosis    She has presentation of vague abdominal pain    02/20/2018 Imaging    Outside CT abdomen and pelvis: This revealed an incidental finding of a 1.5 cm subcapsular lesion in the lateral upper pole of the left kidney which measured higher than fluid attenuation which could represent a proteinaceous renal cyst or solid renal mass.  There was no ascites.  There is no lymphadenopathy.  There were no masses in the pelvis including ovarian or adnexal masses seen.  There was however soft tissue stranding seen with nodularity in  the omental fat in the lower abdomen without evidence of ascites.  This was felt to represent either carcinomatosis or inflammatory infectious etiologies.  There was colonic diverticulosis seen without radiographic evidence of diverticulitis.  The radiologist recommended MRI of the abdomen to further work-up the renal mass    02/22/2018 Tumor Marker    Patient's tumor was tested for the following markers: CA-125. Results of the tumor marker test revealed 103.    03/06/2018 Pathology Results    Omentum, biopsy - ADENOCARCINOMA WITH PSAMMOMA BODIES, SEE COMMENT. Microscopic Comment There are scattered small foci of malignant glands with associated psammoma bodies. While the tumor is somewhat limited the cells have a more low grade appearance. There is prominent lymphovascular invasion. Immunohistochemistry reveals the cells are positive for cytokeratin 7, ER, MOC31, PAX8, and WT-1. They are negative for calretinin, cytokeratin 20, CDX-2, and TTF-1. Overall, the findings are consistent with adenocarcinoma. The differential includes a primary gynecologic or peritoneal tumor, although the morphology is not typical of a high grade serous carcinoma.    03/06/2018 Surgery    Surgeon: Donaciano Eva   Pre-operative Diagnosis: omental mass, elevated CA 125 Operation: Laparoscopic omentectomy  Surgeon: Donaciano Eva  Operative Findings:  : normal diaphragm and upper omentum. Sigmoid colon densely adherent to left pelvic side wall consistent with history of diverticulitis. Omentum adherent to anterior abdominal wall and sigmoid colon. Surgically absent uterus. Right ovary very small and grossly normal. Left ovary obscured by adhesed sigmoid colon. No ascites. No carcinomatosis. There was a nodular firm distal omentum on the left which was adherent to the sigmoid colon and most consistent with inflammatory change.       03/21/2018 Pathology Results    1. Soft tissue mass, simple  excision, midline abdominal wall mass - METASTATIC SEROUS CARCINOMA. 2. Omentum, resection for tumor - METASTATIC SEROUS CARCINOMA. 3. Adnexa - ovary +/- tube, neoplastic, right - RIGHT FALLOPIAN TUBE: -HIGH GRADE SEROUS CARCINOMA. -SEE ONCOLOGY TABLE. - RIGHT OVARY: SURFACE PSAMMOMA BODIES. NO DEFINITIVE MALIGNANT CELLS. -INCLUSION CYSTS. 4. Adnexa - ovary +/- tube, neoplastic, left - LEFT FALLOPIAN TUBE: LUMINAL AND SURFACE SEROUS CARCINOMA. - LEFT OVARY: FOCAL PSAMMOMA BODIES AND MALIGNANT CELLS. 5. Soft tissue mass, simple excision, left abdominal wall - METASTATIC SEROUS CARCINOMA. Microscopic Comment 3. OVARY or FALLOPIAN TUBE or PRIMARY PERITONEUM: Procedure: Bilateral salpingo-oophorectomy. Abdominal wall mass excision and omentectomy. Specimen Integrity: Intact. Tumor Site: Right fallopian tube. Ovarian Surface Involvement (required only if applicable): Present (left). Fallopian Tube Surface Involvement (required only if applicable): Present. Tumor Size: 1.5 cm. Histologic Type: High grade serous carcinoma. Histologic Grade: High grade. Implants (required for advanced stage serous/seromucinous borderline tumors only): Present. Other Tissue/ Organ Involvement: Omentum, abdominal wall, left fallopian tube and ovary. Largest Extrapelvic Peritoneal Focus (required only if applicable): >2 cm. Peritoneal/Ascitic Fluid: N/A. Treatment Effect (required only for high-grade serous carcinomas): N/A. Regional Lymph Nodes:  No lymph nodes submitted or found Pathologic Stage Classification (pTNM, AJCC 8th Edition): pT3c, pNX Representative Tumor Block: 3A Comment(s): None.    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   Surgeon: Thereasa Solo, MD. Specimens:  Bilateral tubes / ovaries, omentum. Midline abdominal wall mass, left lateral abdominal wall mass.    Operative Findings:  Abdominal wall masses consistent with port site metastases at the umbilical incision (7cm) and left lateral incision (5cm). Omental caking in the infracolic omentum. Grossly normal very small tubes and ovaries. Normal diaphragms. No lymphadenopathy. Normal small and large intestine with the exception of miliary studding of tumor on the surface of the sigmoid colon and rectum in the cul de sac.    This represented an optimal cytoreduction (R1) with no gross visible disease remaining, however there was a thin rind of tumor on the lateral left fascia associated with the port site metastasis.      04/11/2018 Cancer Staging    Staging form: Ovary, Fallopian Tube, and Primary Peritoneal Carcinoma, AJCC 8th Edition - Pathologic: FIGO Stage IIIC (pT3, pN0, cM0) - Signed by [name], MD on 04/11/2018     REVIEW OF SYSTEMS:   Constitutional: Denies fevers, chills or abnormal weight loss Eyes: Denies blurriness of vision Ears, nose, mouth, throat, and face: Denies mucositis or sore throat Respiratory: Denies cough, dyspnea or wheezes Cardiovascular: Denies palpitation, chest discomfort or lower extremity swelling Gastrointestinal:  Denies nausea, heartburn or change in bowel habits Lymphatics: Denies new lymphadenopathy or easy bruising Neurological:Denies numbness, tingling or new weaknesses Behavioral/Psych: Mood is stable, no new changes  All other systems were reviewed with the patient and are negative.  I have reviewed the past medical history, past surgical history, social history and family history with the patient and they are unchanged from previous note.  ALLERGIES:  is allergic to ivp dye [iodinated diagnostic agents]; propoxyphene; codeine; and ultram [tramadol hcl].  MEDICATIONS:  Current Outpatient Medications  Medication Sig Dispense Refill  .  carboxymethylcellulose (REFRESH PLUS) 0.5 % SOLN Place 1 drop into both eyes 3 (three) times daily as needed (for dry eyes.).    Marland Kitchen Cholecalciferol (VITAMIN D3 SUPER STRENGTH) 50 MCG (2000 UT) TABS Take 2,000 Units by mouth daily.    . ciprofloxacin (CIPRO) 500 MG tablet Take 1 tablet (500 mg total) by mouth 2 (two) times daily. 20 tablet 0  . clindamycin (CLINDAGEL) 1 % gel Apply 1 application topically 2 (two) times daily.    . furosemide (LASIX) 40 MG tablet Take by mouth.    Marland Kitchen ibuprofen (ADVIL,MOTRIN) 600 MG tablet Take 1 tablet (600 mg total) by mouth every 6 (six) hours as needed. 30 tablet 0  . magnesium oxide (MAG-OX) 400 MG tablet Take by mouth.    . oxyCODONE (OXY IR/ROXICODONE) 5 MG immediate release tablet Take 1 tablet (5 mg total) by mouth every 4 (four) hours as needed for severe pain. 30 tablet 0  . potassium chloride SA (K-DUR,KLOR-CON) 20 MEQ tablet Take by mouth.    . predniSONE (DELTASONE) 50 MG tablet Take 1 pill at 13 hours, 7 hours and 1 hour before CT scan 3 tablet 0  . senna-docusate (SENOKOT-S) 8.6-50 MG tablet Take 2 tablets by mouth at bedtime. Hold if having loose stools 60 tablet 1  . traZODone (DESYREL) 50 MG tablet Take 50 mg  by mouth at bedtime as needed for sleep.   0  . triamcinolone cream (KENALOG) 0.5 % Apply 1 application topically 2 (two) times daily. Apply to eyelids/eyebrows  4   No current facility-administered medications for this visit.     PHYSICAL EXAMINATION: ECOG PERFORMANCE STATUS: 1 - Symptomatic but completely ambulatory  Vitals:   05/11/18 1237  BP: (!) 152/77  Pulse: 98  Resp: 18  Temp: 98.2 F (36.8 C)  SpO2: 100%   Filed Weights   05/11/18 1237  Weight: 145 lb 3.2 oz (65.9 kg)    GENERAL:alert, no distress and comfortable SKIN: The surrounding skin near the exit wound of her abdominal incision area is slightly moist but no significant signs to suggest cellulitis NEURO: alert & oriented x 3 with fluent speech, no focal  motor/sensory deficits  LABORATORY DATA:  I have reviewed the data as listed    Component Value Date/Time   NA 140 03/23/2018 0459   K 4.5 03/23/2018 0459   CL 112 (H) 03/23/2018 0459   CO2 23 03/23/2018 0459   GLUCOSE 101 (H) 03/23/2018 0459   BUN 15 03/23/2018 0459   CREATININE 0.64 03/23/2018 0459   CALCIUM 8.0 (L) 03/23/2018 0459   PROT 7.0 03/15/2018 0812   ALBUMIN 4.1 03/15/2018 0812   AST 17 03/15/2018 0812   ALT 14 03/15/2018 0812   ALKPHOS 45 03/15/2018 0812   BILITOT 0.7 03/15/2018 0812   GFRNONAA >60 03/23/2018 0459   GFRAA >60 03/23/2018 0459    No results found for: SPEP, UPEP  Lab Results  Component Value Date   WBC 14.0 (H) 03/23/2018   NEUTROABS 5.3 02/28/2018   HGB 10.2 (L) 03/23/2018   HCT 33.3 (L) 03/23/2018   MCV 92.5 03/23/2018   PLT 186 03/23/2018      Chemistry      Component Value Date/Time   NA 140 03/23/2018 0459   K 4.5 03/23/2018 0459   CL 112 (H) 03/23/2018 0459   CO2 23 03/23/2018 0459   BUN 15 03/23/2018 0459   CREATININE 0.64 03/23/2018 0459      Component Value Date/Time   CALCIUM 8.0 (L) 03/23/2018 0459   ALKPHOS 45 03/15/2018 0812   AST 17 03/15/2018 0812   ALT 14 03/15/2018 0812   BILITOT 0.7 03/15/2018 0812         All questions were answered. The patient knows to call the clinic with any problems, questions or concerns. No barriers to learning was detected.  I spent 20 minutes counseling the patient face to face. The total time spent in the appointment was 30 minutes and more than 50% was on counseling and review of test results  [name], MD 05/11/2018 1:10 PM

## 2018-05-11 NOTE — Assessment & Plan Note (Signed)
This is stable on pain medicine.  She will continue the same

## 2018-05-11 NOTE — Assessment & Plan Note (Signed)
The depth of the abdominal wound is getting less, down to about 1.5 cm Pictures taken with the patient's permission to follow

## 2018-05-12 ENCOUNTER — Telehealth: Payer: Self-pay

## 2018-05-12 ENCOUNTER — Telehealth: Payer: Self-pay | Admitting: Hematology and Oncology

## 2018-05-12 NOTE — Telephone Encounter (Signed)
-----   Message from [name], MD sent at 05/11/2018  1:12 PM EST ----- Regarding: Ct imaging to be done on 1/27 I signed my notes Please get CT authorized to be done on 1/27. She will need labs to be done that day and see me on 1/28 (to be scheduled) Due to reported contrast allergy, we need to give her instructions to take oral prednisone and 50 mg PO benadryl before CT.

## 2018-05-12 NOTE — Telephone Encounter (Signed)
Scheduled appt per 1/17 sch message - pt is aware of appt date and time   

## 2018-05-12 NOTE — Telephone Encounter (Signed)
Called regarding below message. Instructed to take Prednisone 50 mg po 1/27 at 11 pm, 1/28 at 6 am Prednisone 50 mg and 1/28 at 12 noon Prednisone and Benadryl 50 mg. Instructed to pick up contrast at [facility] prior to scan. Scan scheduled for 1/28 at 1300, arrive at 1245 to radiology. Scheduler to call for lab appt prior to scan and appt to see Dr. Alvy Bimler 1/29. She verbalized understanding and will call back for questions.

## 2018-05-17 ENCOUNTER — Telehealth: Payer: Self-pay

## 2018-05-17 NOTE — Telephone Encounter (Signed)
Called to see if she picked up contrast for CT scan for 1/28. She picked it up today. Instructed to take Prednisone 1 tablet 13 hours prior, 7 hours prior and 1 hour prior to scan at 1 pm on 1/28 and to take benadryl 50 mg 1 hour prior to scan. She verbalized understanding.

## 2018-05-23 ENCOUNTER — Encounter (HOSPITAL_COMMUNITY): Payer: Self-pay

## 2018-05-23 ENCOUNTER — Ambulatory Visit (HOSPITAL_COMMUNITY): Payer: Managed Care, Other (non HMO)

## 2018-05-23 ENCOUNTER — Encounter: Payer: Self-pay | Admitting: Gynecologic Oncology

## 2018-05-23 ENCOUNTER — Ambulatory Visit (HOSPITAL_COMMUNITY)
Admission: RE | Admit: 2018-05-23 | Discharge: 2018-05-23 | Disposition: A | Discharge status: Home / Self Care | Payer: Medicare HMO | Source: Ambulatory Visit | Attending: Hematology and Oncology | Admitting: Hematology and Oncology

## 2018-05-23 ENCOUNTER — Inpatient Hospital Stay: Payer: Medicare HMO

## 2018-05-23 DIAGNOSIS — C5701 Malignant neoplasm of right fallopian tube: Secondary | ICD-10-CM | POA: Insufficient documentation

## 2018-05-23 LAB — CBC WITH DIFFERENTIAL/PLATELET
Abs Immature Granulocytes: 0.08 10*3/uL — ABNORMAL HIGH (ref 0.00–0.07)
Basophils Absolute: 0 10*3/uL (ref 0.0–0.1)
Basophils Relative: 0 %
Eosinophils Absolute: 0 10*3/uL (ref 0.0–0.5)
Eosinophils Relative: 0 %
HCT: 41.5 % (ref 36.0–46.0)
Hemoglobin: 12.9 g/dL (ref 12.0–15.0)
Immature Granulocytes: 1 %
Lymphocytes Relative: 10 %
Lymphs Abs: 1.3 10*3/uL (ref 0.7–4.0)
MCH: 27.1 pg (ref 26.0–34.0)
MCHC: 31.1 g/dL (ref 30.0–36.0)
MCV: 87.2 fL (ref 80.0–100.0)
MONOS PCT: 1 %
Monocytes Absolute: 0.1 10*3/uL (ref 0.1–1.0)
NEUTROS PCT: 88 %
Neutro Abs: 11.8 10*3/uL — ABNORMAL HIGH (ref 1.7–7.7)
PLATELETS: 262 10*3/uL (ref 150–400)
RBC: 4.76 MIL/uL (ref 3.87–5.11)
RDW: 13.9 % (ref 11.5–15.5)
WBC: 13.3 10*3/uL — ABNORMAL HIGH (ref 4.0–10.5)
nRBC: 0 % (ref 0.0–0.2)

## 2018-05-23 LAB — COMPREHENSIVE METABOLIC PANEL
ALT: 13 U/L (ref 0–44)
AST: 12 U/L — ABNORMAL LOW (ref 15–41)
Albumin: 4.1 g/dL (ref 3.5–5.0)
Alkaline Phosphatase: 62 U/L (ref 38–126)
Anion gap: 11 (ref 5–15)
BUN: 16 mg/dL (ref 8–23)
CO2: 26 mmol/L (ref 22–32)
Calcium: 9.7 mg/dL (ref 8.9–10.3)
Chloride: 105 mmol/L (ref 98–111)
Creatinine, Ser: 0.74 mg/dL (ref 0.44–1.00)
GFR calc Af Amer: >60 mL/min (ref >60)
GFR calc non Af Amer: >60 mL/min (ref >60)
Glucose, Bld: 148 mg/dL — ABNORMAL HIGH (ref 70–99)
Potassium: 4 mmol/L (ref 3.5–5.1)
Sodium: 142 mmol/L (ref 135–145)
Total Bilirubin: 0.4 mg/dL (ref 0.3–1.2)
Total Protein: 7.7 g/dL (ref 6.5–8.1)

## 2018-05-23 MED ORDER — IOHEXOL 300 MG/ML  SOLN
100.0000 mL | Freq: Once | INTRAMUSCULAR | Status: AC | PRN
  Administered 2018-05-23: 100 mL via INTRAVENOUS

## 2018-05-23 MED ORDER — SODIUM CHLORIDE (PF) 0.9 % IJ SOLN
INTRAMUSCULAR | Status: AC
  Filled 2018-05-23: qty 50

## 2018-05-24 ENCOUNTER — Telehealth: Payer: Self-pay | Admitting: Genetics

## 2018-05-24 ENCOUNTER — Telehealth: Payer: Self-pay | Admitting: Hematology and Oncology

## 2018-05-24 ENCOUNTER — Inpatient Hospital Stay (HOSPITAL_BASED_OUTPATIENT_CLINIC_OR_DEPARTMENT_OTHER): Payer: Medicare HMO | Admitting: Hematology and Oncology

## 2018-05-24 ENCOUNTER — Encounter: Payer: Self-pay | Admitting: Hematology and Oncology

## 2018-05-24 VITALS — BP 144/64 | HR 84 | Temp 97.5°F | Resp 18 | Ht 64.0 in | Wt 147.4 lb

## 2018-05-24 DIAGNOSIS — Z79899 Other long term (current) drug therapy: Secondary | ICD-10-CM

## 2018-05-24 DIAGNOSIS — Z7901 Long term (current) use of anticoagulants: Secondary | ICD-10-CM

## 2018-05-24 DIAGNOSIS — C561 Malignant neoplasm of right ovary: Secondary | ICD-10-CM

## 2018-05-24 DIAGNOSIS — R109 Unspecified abdominal pain: Secondary | ICD-10-CM | POA: Diagnosis not present

## 2018-05-24 DIAGNOSIS — G893 Neoplasm related pain (acute) (chronic): Secondary | ICD-10-CM

## 2018-05-24 DIAGNOSIS — C5701 Malignant neoplasm of right fallopian tube: Secondary | ICD-10-CM | POA: Diagnosis not present

## 2018-05-24 DIAGNOSIS — T8149XA Infection following a procedure, other surgical site, initial encounter: Secondary | ICD-10-CM

## 2018-05-24 DIAGNOSIS — C569 Malignant neoplasm of unspecified ovary: Secondary | ICD-10-CM

## 2018-05-24 LAB — CA 125: Cancer Antigen (CA) 125: 11.6 U/mL (ref 0.0–38.1)

## 2018-05-24 MED ORDER — ONDANSETRON HCL 8 MG PO TABS: 8.0000 mg | ORAL_TABLET | Freq: Three times a day (TID) | ORAL | 1 refills | Status: DC | PRN

## 2018-05-24 MED ORDER — LIDOCAINE-PRILOCAINE 2.5-2.5 % EX CREA: TOPICAL_CREAM | CUTANEOUS | 3 refills | Status: DC

## 2018-05-24 MED ORDER — PROCHLORPERAZINE MALEATE 10 MG PO TABS: 10.0000 mg | ORAL_TABLET | Freq: Four times a day (QID) | ORAL | 1 refills | Status: DC | PRN

## 2018-05-24 MED ORDER — DEXAMETHASONE 4 MG PO TABS: ORAL_TABLET | ORAL | 0 refills | Status: DC

## 2018-05-24 MED FILL — ONDANSETRON HCL 8 MG TABLET: 8 | 7 days supply | Qty: 30 | Fill #0

## 2018-05-24 MED FILL — LIDOCAINE-PRILOCAINE CREAM: 2.5-2.5 | 30 days supply | Qty: 30 | Fill #0

## 2018-05-24 MED FILL — DEXAMETHASONE 4 MG TABLET: 4 | 84 days supply | Qty: 16 | Fill #0

## 2018-05-24 MED FILL — PROCHLORPERAZINE 10 MG TAB: 10 | 7 days supply | Qty: 30 | Fill #0

## 2018-05-24 NOTE — Progress Notes (Signed)
[place] OFFICE PROGRESS NOTE  Patient Care Team: Allie Dimmer, MD as PCP - General (Internal Medicine)  ASSESSMENT & PLAN:  Fallopian tube cancer, carcinoma, right (Misty Hopkins) I have reviewed all imaging study with the patient and her husband We discussed the role of adjuvant treatment  We reviewed the NCCN guidelines We discussed the role of chemotherapy. The intent is of curative intent.  We discussed some of the risks, benefits, side-effects of carboplatin & Taxol. Treatment is intravenous, every 3 weeks x 6 cycles  Some of the short term side-effects included, though not limited to, including weight loss, life threatening infections, risk of allergic reactions, need for transfusions of blood products, nausea, vomiting, change in bowel habits, loss of hair, admission to hospital for various reasons, and risks of death.   Long term side-effects are also discussed including risks of infertility, permanent damage to nerve function, hearing loss, chronic fatigue, kidney damage with possibility needing hemodialysis, and rare secondary malignancy including bone marrow disorders.  The patient is aware that the response rates discussed earlier is not guaranteed.  After a long discussion, patient made an informed decision to proceed with the prescribed plan of care.   Patient education material was dispensed. We discussed premedication with dexamethasone before chemotherapy. She does not need prophylactic G-CSF support Due to slow, nonhealing wound, I do not recommend port placement for cycle 1 of therapy I plan to see her back a week after chemo to reassess and see whether we can proceed with port placement after that  Wound infection after surgery Her abdominal wound is improving but there is still surrounding erythema She will continue dressing changes I think it is good enough to proceed with treatment next week but I will hold off port placement until I see her back  again   Orders Placed This Encounter  Procedures  . CBC with Differential (Cancer Center Only)    Standing Status:   Standing    Number of Occurrences:   20    Standing Expiration Date:   05/25/2019  . CMP ([facility] only)    Standing Status:   Standing    Number of Occurrences:   20    Standing Expiration Date:   05/25/2019    INTERVAL HISTORY: Please see below for problem oriented charting. She returns with her husband to review test results Her abdominal wound is improving She is eating well and gaining weight.  Denies constipation No recent fever or chills She gave me permission to take pictures of her abdominal wound  SUMMARY OF ONCOLOGIC HISTORY: Oncology History   High grade serous, right fallopian tube     Ovarian cancer (Misty Hopkins)   03/21/2018 Initial Diagnosis    Ovarian cancer ([place])     Fallopian tube cancer, carcinoma, right (Misty Hopkins)   10/24/2017 Initial Diagnosis    She has presentation of vague abdominal pain    02/20/2018 Imaging    Outside CT abdomen and pelvis: This revealed an incidental finding of a 1.5 cm subcapsular lesion in the lateral upper pole of the left kidney which measured higher than fluid attenuation which could represent a proteinaceous renal cyst or solid renal mass.  There was no ascites.  There is no lymphadenopathy.  There were no masses in the pelvis including ovarian or adnexal masses seen.  There was however soft tissue stranding seen with nodularity in the omental fat in the lower abdomen without evidence of ascites.  This was felt to represent either carcinomatosis or  inflammatory infectious etiologies.  There was colonic diverticulosis seen without radiographic evidence of diverticulitis.  The radiologist recommended MRI of the abdomen to further work-up the renal mass    02/22/2018 Tumor Marker    Patient's tumor was tested for the following markers: CA-125. Results of the tumor marker test revealed 103.    03/06/2018 Pathology Results     Omentum, biopsy - ADENOCARCINOMA WITH PSAMMOMA BODIES, SEE COMMENT. Microscopic Comment There are scattered small foci of malignant glands with associated psammoma bodies. While the tumor is somewhat limited the cells have a more low grade appearance. There is prominent lymphovascular invasion. Immunohistochemistry reveals the cells are positive for cytokeratin 7, ER, MOC31, PAX8, and WT-1. They are negative for calretinin, cytokeratin 20, CDX-2, and TTF-1. Overall, the findings are consistent with adenocarcinoma. The differential includes a primary gynecologic or peritoneal tumor, although the morphology is not typical of a high grade serous carcinoma.    03/06/2018 Surgery    Surgeon: Donaciano Eva   Pre-operative Diagnosis: omental mass, elevated CA 125 Operation: Laparoscopic omentectomy  Surgeon: Donaciano Eva  Operative Findings:  : normal diaphragm and upper omentum. Sigmoid colon densely adherent to left pelvic side wall consistent with history of diverticulitis. Omentum adherent to anterior abdominal wall and sigmoid colon. Surgically absent uterus. Right ovary very small and grossly normal. Left ovary obscured by adhesed sigmoid colon. No ascites. No carcinomatosis. There was a nodular firm distal omentum on the left which was adherent to the sigmoid colon and most consistent with inflammatory change.       03/21/2018 Pathology Results    1. Soft tissue mass, simple excision, midline abdominal wall mass - METASTATIC SEROUS CARCINOMA. 2. Omentum, resection for tumor - METASTATIC SEROUS CARCINOMA. 3. Adnexa - ovary +/- tube, neoplastic, right - RIGHT FALLOPIAN TUBE: -HIGH GRADE SEROUS CARCINOMA. -SEE ONCOLOGY TABLE. - RIGHT OVARY: SURFACE PSAMMOMA BODIES. NO DEFINITIVE MALIGNANT CELLS. -INCLUSION CYSTS. 4. Adnexa - ovary +/- tube, neoplastic, left - LEFT FALLOPIAN TUBE: LUMINAL AND SURFACE SEROUS CARCINOMA. - LEFT OVARY: FOCAL PSAMMOMA BODIES AND  MALIGNANT CELLS. 5. Soft tissue mass, simple excision, left abdominal wall - METASTATIC SEROUS CARCINOMA. Microscopic Comment 3. OVARY or FALLOPIAN TUBE or PRIMARY PERITONEUM: Procedure: Bilateral salpingo-oophorectomy. Abdominal wall mass excision and omentectomy. Specimen Integrity: Intact. Tumor Site: Right fallopian tube. Ovarian Surface Involvement (required only if applicable): Present (left). Fallopian Tube Surface Involvement (required only if applicable): Present. Tumor Size: 1.5 cm. Histologic Type: High grade serous carcinoma. Histologic Grade: High grade. Implants (required for advanced stage serous/seromucinous borderline tumors only): Present. Other Tissue/ Organ Involvement: Omentum, abdominal wall, left fallopian tube and ovary. Largest Extrapelvic Peritoneal Focus (required only if applicable): >2 cm. Peritoneal/Ascitic Fluid: N/A. Treatment Effect (required only for high-grade serous carcinomas): N/A. Regional Lymph Nodes: No lymph nodes submitted or found Pathologic Stage Classification (pTNM, AJCC 8th Edition): pT3c, pNX Representative Tumor Block: 3A Comment(s): None.    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   Surgeon: Thereasa Solo, MD. Specimens: Bilateral tubes / ovaries, omentum. Midline abdominal wall mass, left lateral abdominal wall mass.    Operative Findings:  Abdominal wall masses consistent with port site metastases at the umbilical incision (7cm) and left lateral incision (5cm). Omental caking in the infracolic omentum. Grossly normal very small tubes and ovaries. Normal diaphragms. No lymphadenopathy. Normal small and  large intestine with the exception of miliary studding of tumor on the surface of the sigmoid colon and rectum  in the cul de sac.    This represented an optimal cytoreduction (R1) with no gross visible disease remaining, however there was a thin rind of tumor on the lateral left fascia associated with the port site metastasis.      04/11/2018 Cancer Staging    Staging form: Ovary, Fallopian Tube, and Primary Peritoneal Carcinoma, AJCC 8th Edition - Pathologic: FIGO Stage IIIC (pT3, pN0, cM0) - Signed by [name], MD on 04/11/2018    05/23/2018 Imaging    1. Interval resolution of the anterior midline abdominal wall seroma. There is some persistent wispy soft tissue attenuation in the region of the midline incision, nonspecific and may simply reflect granulation tissue from surgery. 2. Stable 13 mm exophytic lesion posterior left kidney with attenuation higher than expected for a simple cyst. This may be a cyst complicated by proteinaceous debris or hemorrhage, but attention on follow-up recommended. 3. Stable mild persistent distention of proximal jejunal loops with gradual tapering to nondilated distal small bowel. 4. No overt peritoneal or mesenteric disease identified on this exam.    05/23/2018 Tumor Marker    Patient's tumor was tested for the following markers: CA-125 Results of the tumor marker test revealed 11.6     REVIEW OF SYSTEMS:   Constitutional: Denies fevers, chills or abnormal weight loss Eyes: Denies blurriness of vision Ears, nose, mouth, throat, and face: Denies mucositis or sore throat Respiratory: Denies cough, dyspnea or wheezes Cardiovascular: Denies palpitation, chest discomfort or lower extremity swelling Gastrointestinal:  Denies nausea, heartburn or change in bowel habits Lymphatics: Denies new lymphadenopathy or easy bruising Neurological:Denies numbness, tingling or new weaknesses Behavioral/Psych: Mood is stable, no new changes  All other systems were reviewed with the patient and are negative.  I have reviewed the past medical history, past surgical history,  social history and family history with the patient and they are unchanged from previous note.  ALLERGIES:  is allergic to ivp dye [iodinated diagnostic agents]; propoxyphene; codeine; and ultram [tramadol hcl].  MEDICATIONS:  Current Outpatient Medications  Medication Sig Dispense Refill  . carboxymethylcellulose (REFRESH PLUS) 0.5 % SOLN Place 1 drop into both eyes 3 (three) times daily as needed (for dry eyes.).    Marland Kitchen Cholecalciferol (VITAMIN D3 SUPER STRENGTH) 50 MCG (2000 UT) TABS Take 2,000 Units by mouth daily.    Marland Kitchen dexamethasone (DECADRON) 4 MG tablet Take 2 tabs at the night before and 2 tab the morning of chemotherapy, every 3 weeks, by mouth 24 tablet 0  . lidocaine-prilocaine (EMLA) cream Apply to affected area once 30 g 3  . ondansetron (ZOFRAN) 8 MG tablet Take 1 tablet (8 mg total) by mouth every 8 (eight) hours as needed for refractory nausea / vomiting. Start on day 3 after chemo. 30 tablet 1  . prochlorperazine (COMPAZINE) 10 MG tablet Take 1 tablet (10 mg total) by mouth every 6 (six) hours as needed (Nausea or vomiting). 30 tablet 1  . senna-docusate (SENOKOT-S) 8.6-50 MG tablet Take 2 tablets by mouth at bedtime. Hold if having loose stools 60 tablet 1  . traZODone (DESYREL) 50 MG tablet Take 50 mg by mouth at bedtime as needed for sleep.   0  . triamcinolone cream (KENALOG) 0.5 % Apply 1 application topically 2 (two) times daily. Apply to eyelids/eyebrows  4   No current facility-administered medications for this visit.     PHYSICAL EXAMINATION:  ECOG PERFORMANCE STATUS: 1 - Symptomatic but completely ambulatory  Vitals:   05/24/18 1033  BP: (!) 144/64  Pulse: 84  Resp: 18  Temp: (!) 97.5 F (36.4 C)  SpO2: 99%   Filed Weights   05/24/18 1033  Weight: 147 lb 6.4 oz (66.9 kg)    GENERAL:alert, no distress and comfortable SKIN: There is mild persistent erythema close to the area of her wound.  Overall, the skin looks okay, and is improved compared to prior  visit EYES: normal, Conjunctiva are pink and non-injected, sclera clear OROPHARYNX:no exudate, no erythema and lips, buccal mucosa, and tongue normal  NECK: supple, thyroid normal size, non-tender, without nodularity LYMPH:  no palpable lymphadenopathy in the cervical, axillary or inguinal LUNGS: clear to auscultation and percussion with normal breathing effort HEART: regular rate & rhythm and no murmurs and no lower extremity edema ABDOMEN:abdomen soft, non-tender and normal bowel sounds Musculoskeletal:no cyanosis of digits and no clubbing  NEURO: alert & oriented x 3 with fluent speech, no focal motor/sensory deficits     LABORATORY DATA:  I have reviewed the data as listed    Component Value Date/Time   NA 142 05/23/2018 1211   K 4.0 05/23/2018 1211   CL 105 05/23/2018 1211   CO2 26 05/23/2018 1211   GLUCOSE 148 (H) 05/23/2018 1211   BUN 16 05/23/2018 1211   CREATININE 0.74 05/23/2018 1211   CALCIUM 9.7 05/23/2018 1211   PROT 7.7 05/23/2018 1211   ALBUMIN 4.1 05/23/2018 1211   AST 12 (L) 05/23/2018 1211   ALT 13 05/23/2018 1211   ALKPHOS 62 05/23/2018 1211   BILITOT 0.4 05/23/2018 1211   GFRNONAA >60 05/23/2018 1211   GFRAA >60 05/23/2018 1211    No results found for: SPEP, UPEP  Lab Results  Component Value Date   WBC 13.3 (H) 05/23/2018   NEUTROABS 11.8 (H) 05/23/2018   HGB 12.9 05/23/2018   HCT 41.5 05/23/2018   MCV 87.2 05/23/2018   PLT 262 05/23/2018      Chemistry      Component Value Date/Time   NA 142 05/23/2018 1211   K 4.0 05/23/2018 1211   CL 105 05/23/2018 1211   CO2 26 05/23/2018 1211   BUN 16 05/23/2018 1211   CREATININE 0.74 05/23/2018 1211      Component Value Date/Time   CALCIUM 9.7 05/23/2018 1211   ALKPHOS 62 05/23/2018 1211   AST 12 (L) 05/23/2018 1211   ALT 13 05/23/2018 1211   BILITOT 0.4 05/23/2018 1211       RADIOGRAPHIC STUDIES: I have personally reviewed the radiological images as listed and agreed with the findings in  the report. Ct Chest W Contrast  Result Date: 05/23/2018 CLINICAL DATA:  Fallopian tube cancer. Slow healing abdominal wound concerning for recurrent disease at the site of surgery. EXAM: CT CHEST, ABDOMEN, AND PELVIS WITH CONTRAST TECHNIQUE: Multidetector CT imaging of the chest, abdomen and pelvis was performed following the standard protocol during bolus administration of intravenous contrast. CONTRAST:  159mL OMNIPAQUE IOHEXOL 300 MG/ML  SOLN COMPARISON:  [facility] CT exam from 03/30/2018. FINDINGS: CT CHEST FINDINGS Cardiovascular: The heart size is normal. No substantial pericardial effusion. Coronary artery calcification is evident. No thoracic aortic aneurysm. Mediastinum/Nodes: No mediastinal lymphadenopathy. There is no hilar lymphadenopathy. The esophagus has normal imaging features. There is no axillary lymphadenopathy. Lungs/Pleura: The central tracheobronchial airways are patent. No suspicious pulmonary nodule or mass. No focal consolidation. No pleural effusion. Musculoskeletal: No worrisome lytic or sclerotic  osseous abnormality. CT ABDOMEN PELVIS FINDINGS Hepatobiliary: No suspicious focal abnormality within the liver parenchyma. There is no evidence for gallstones, gallbladder wall thickening, or pericholecystic fluid. No intrahepatic or extrahepatic biliary dilation. Pancreas: No focal mass lesion. No dilatation of the main duct. No intraparenchymal cyst. No peripancreatic edema. Spleen: No splenomegaly. No focal mass lesion. Adrenals/Urinary Tract: No adrenal nodule or mass. Right kidney unremarkable. 13 mm exophytic lesion posterior left kidney has attenuation higher than would be expected for a simple cyst. No evidence for hydroureter. The urinary bladder appears normal for the degree of distention. Stomach/Bowel: Stomach is unremarkable. No gastric wall thickening. No evidence of outlet obstruction. Duodenum is normally positioned as is the ligament of Treitz. No small bowel wall  thickening. Similar appearance of mild proximal jejunal distention with mid and distal small bowel remaining nondistended. The terminal ileum is normal. The appendix is not visualized, but there is no edema or inflammation in the region of the cecum. Diverticuli are seen scattered along the entire length of the colon without CT findings of diverticulitis. Vascular/Lymphatic: There is abdominal aortic atherosclerosis without aneurysm. There is no gastrohepatic or hepatoduodenal ligament lymphadenopathy. No intraperitoneal or retroperitoneal lymphadenopathy. No pelvic sidewall lymphadenopathy. Reproductive: Uterus surgically absent.  There is no adnexal mass. Other: No substantial intraperitoneal free fluid. Status post omentectomy. Musculoskeletal: The midline subcutaneous fluid collection identified in the anterior abdominal wall on the previous CT has resolved in the interval, compatible with clearance of a postoperative seroma. There is some residual wispy soft tissue attenuation in the midline incision, nonspecific. No worrisome lytic or sclerotic osseous abnormality. IMPRESSION: 1. Interval resolution of the anterior midline abdominal wall seroma. There is some persistent wispy soft tissue attenuation in the region of the midline incision, nonspecific and may simply reflect granulation tissue from surgery. 2. Stable 13 mm exophytic lesion posterior left kidney with attenuation higher than expected for a simple cyst. This may be a cyst complicated by proteinaceous debris or hemorrhage, but attention on follow-up recommended. 3. Stable mild persistent distention of proximal jejunal loops with gradual tapering to nondilated distal small bowel. 4. No overt peritoneal or mesenteric disease identified on this exam. Electronically Signed   By: Misty Stanley M.D.   On: 05/23/2018 18:44   Ct Abdomen Pelvis W Contrast  Result Date: 05/23/2018 CLINICAL DATA:  Fallopian tube cancer. Slow healing abdominal wound  concerning for recurrent disease at the site of surgery. EXAM: CT CHEST, ABDOMEN, AND PELVIS WITH CONTRAST TECHNIQUE: Multidetector CT imaging of the chest, abdomen and pelvis was performed following the standard protocol during bolus administration of intravenous contrast. CONTRAST:  168mL OMNIPAQUE IOHEXOL 300 MG/ML  SOLN COMPARISON:  [facility] CT exam from 03/30/2018. FINDINGS: CT CHEST FINDINGS Cardiovascular: The heart size is normal. No substantial pericardial effusion. Coronary artery calcification is evident. No thoracic aortic aneurysm. Mediastinum/Nodes: No mediastinal lymphadenopathy. There is no hilar lymphadenopathy. The esophagus has normal imaging features. There is no axillary lymphadenopathy. Lungs/Pleura: The central tracheobronchial airways are patent. No suspicious pulmonary nodule or mass. No focal consolidation. No pleural effusion. Musculoskeletal: No worrisome lytic or sclerotic osseous abnormality. CT ABDOMEN PELVIS FINDINGS Hepatobiliary: No suspicious focal abnormality within the liver parenchyma. There is no evidence for gallstones, gallbladder wall thickening, or pericholecystic fluid. No intrahepatic or extrahepatic biliary dilation. Pancreas: No focal mass lesion. No dilatation of the main duct. No intraparenchymal cyst. No peripancreatic edema. Spleen: No splenomegaly. No focal mass lesion. Adrenals/Urinary Tract: No adrenal nodule or mass. Right kidney unremarkable. 13 mm  exophytic lesion posterior left kidney has attenuation higher than would be expected for a simple cyst. No evidence for hydroureter. The urinary bladder appears normal for the degree of distention. Stomach/Bowel: Stomach is unremarkable. No gastric wall thickening. No evidence of outlet obstruction. Duodenum is normally positioned as is the ligament of Treitz. No small bowel wall thickening. Similar appearance of mild proximal jejunal distention with mid and distal small bowel remaining nondistended. The  terminal ileum is normal. The appendix is not visualized, but there is no edema or inflammation in the region of the cecum. Diverticuli are seen scattered along the entire length of the colon without CT findings of diverticulitis. Vascular/Lymphatic: There is abdominal aortic atherosclerosis without aneurysm. There is no gastrohepatic or hepatoduodenal ligament lymphadenopathy. No intraperitoneal or retroperitoneal lymphadenopathy. No pelvic sidewall lymphadenopathy. Reproductive: Uterus surgically absent.  There is no adnexal mass. Other: No substantial intraperitoneal free fluid. Status post omentectomy. Musculoskeletal: The midline subcutaneous fluid collection identified in the anterior abdominal wall on the previous CT has resolved in the interval, compatible with clearance of a postoperative seroma. There is some residual wispy soft tissue attenuation in the midline incision, nonspecific. No worrisome lytic or sclerotic osseous abnormality. IMPRESSION: 1. Interval resolution of the anterior midline abdominal wall seroma. There is some persistent wispy soft tissue attenuation in the region of the midline incision, nonspecific and may simply reflect granulation tissue from surgery. 2. Stable 13 mm exophytic lesion posterior left kidney with attenuation higher than expected for a simple cyst. This may be a cyst complicated by proteinaceous debris or hemorrhage, but attention on follow-up recommended. 3. Stable mild persistent distention of proximal jejunal loops with gradual tapering to nondilated distal small bowel. 4. No overt peritoneal or mesenteric disease identified on this exam. Electronically Signed   By: Misty Stanley M.D.   On: 05/23/2018 18:44    All questions were answered. The patient knows to call the clinic with any problems, questions or concerns. No barriers to learning was detected.  I spent 25 minutes counseling the patient face to face. The total time spent in the appointment was 30  minutes and more than 50% was on counseling and review of test results  [name], MD 05/24/2018 11:36 AM

## 2018-05-24 NOTE — Telephone Encounter (Signed)
Gave avs and calendar ° °

## 2018-05-24 NOTE — Assessment & Plan Note (Signed)
I have reviewed all imaging study with the patient and her husband We discussed the role of adjuvant treatment  We reviewed the NCCN guidelines We discussed the role of chemotherapy. The intent is of curative intent.  We discussed some of the risks, benefits, side-effects of carboplatin & Taxol. Treatment is intravenous, every 3 weeks x 6 cycles  Some of the short term side-effects included, though not limited to, including weight loss, life threatening infections, risk of allergic reactions, need for transfusions of blood products, nausea, vomiting, change in bowel habits, loss of hair, admission to hospital for various reasons, and risks of death.   Long term side-effects are also discussed including risks of infertility, permanent damage to nerve function, hearing loss, chronic fatigue, kidney damage with possibility needing hemodialysis, and rare secondary malignancy including bone marrow disorders.  The patient is aware that the response rates discussed earlier is not guaranteed.  After a long discussion, patient made an informed decision to proceed with the prescribed plan of care.   Patient education material was dispensed. We discussed premedication with dexamethasone before chemotherapy. She does not need prophylactic G-CSF support Due to slow, nonhealing wound, I do not recommend port placement for cycle 1 of therapy I plan to see her back a week after chemo to reassess and see whether we can proceed with port placement after that

## 2018-05-24 NOTE — Telephone Encounter (Signed)
Went over blood/germline genetic test results.  Explained MUTYH result- that having just 1 MUTYH mutation does not significantly impact health or cancer risk- however if someone were to have 2- they would be at significant risk to develop colon polyps and colon cancer. 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   For example, if her children's father was also a carrier, her children would be at risk to have this polyposis risk- or if both of her parents were carriers, her siblings could be at risk. Therefore, we do recommend her relatives have genetic testing for this fining to make sure they do not have 2 MUTYH mutations.   Also discussed AXIN2 VUS.    Overall these results do not suggest a hereditary/genetic cause for he ovarian cancer.  Although we did explain genetic testing is not perfect and there is always a chance we miss a mutation that current technology cannot detect yet.   We will contact her to let her know the results from the tumor HRD testing when that is available.

## 2018-05-24 NOTE — Assessment & Plan Note (Signed)
Her abdominal wound is improving but there is still surrounding erythema She will continue dressing changes I think it is good enough to proceed with treatment next week but I will hold off port placement until I see her back again

## 2018-05-25 ENCOUNTER — Encounter: Payer: Self-pay | Admitting: Hematology and Oncology

## 2018-05-25 NOTE — Progress Notes (Signed)
Called pt to introduce myself as her Arboriculturist and to discuss copay assistance and the [employer].  Pt was unavailable so I spoke to her husband.  He will get her to return my call.

## 2018-05-25 NOTE — Progress Notes (Signed)
Ptreturned my call so I introduced myself as her Arboriculturist and discussed copay assistance. Pt gave me consent to apply in her behalf so I applied to the[employer]and she was approvedfor $4,000 for a period of 12 months from 05/25/18. Pt is over the income requirement for the [employer] so she cannot apply at this time.  I will give her my card on 06/02/18 for any questions or concerns she may have in the future.

## 2018-05-25 NOTE — Progress Notes (Signed)
Patient waiting in the lobby for lab appt prior to CT scan.  Stating that the opening in her abdomen has decreased in size dramatically.  Pt and husband brought back in exam room.  Abdominal incision/wound assessed.  No evidence of infection.  No drainage.  Wound opening measuring 0.5 cm in width and 1.5 cm in depth.  Packed with 1/4 inch packing strip without difficulty.  Healing well.  Dr. Denman George aware.  Patient advised to continue as planned and to follow up as scheduled tomorrow.

## 2018-05-31 ENCOUNTER — Encounter (HOSPITAL_COMMUNITY): Payer: Self-pay | Admitting: Hematology and Oncology

## 2018-06-02 ENCOUNTER — Inpatient Hospital Stay: Payer: Medicare HMO

## 2018-06-02 ENCOUNTER — Inpatient Hospital Stay: Payer: Medicare HMO | Attending: Gynecologic Oncology

## 2018-06-02 VITALS — BP 145/81 | HR 77 | Temp 98.1°F | Resp 18

## 2018-06-02 DIAGNOSIS — G620 Drug-induced polyneuropathy: Secondary | ICD-10-CM | POA: Diagnosis present

## 2018-06-02 DIAGNOSIS — C5701 Malignant neoplasm of right fallopian tube: Secondary | ICD-10-CM

## 2018-06-02 DIAGNOSIS — C569 Malignant neoplasm of unspecified ovary: Secondary | ICD-10-CM

## 2018-06-02 DIAGNOSIS — C5702 Malignant neoplasm of left fallopian tube: Secondary | ICD-10-CM | POA: Insufficient documentation

## 2018-06-02 DIAGNOSIS — C562 Malignant neoplasm of left ovary: Secondary | ICD-10-CM | POA: Insufficient documentation

## 2018-06-02 DIAGNOSIS — C786 Secondary malignant neoplasm of retroperitoneum and peritoneum: Secondary | ICD-10-CM | POA: Insufficient documentation

## 2018-06-02 DIAGNOSIS — T451X5A Adverse effect of antineoplastic and immunosuppressive drugs, initial encounter: Secondary | ICD-10-CM | POA: Diagnosis not present

## 2018-06-02 DIAGNOSIS — C7989 Secondary malignant neoplasm of other specified sites: Secondary | ICD-10-CM | POA: Diagnosis not present

## 2018-06-02 DIAGNOSIS — Z5111 Encounter for antineoplastic chemotherapy: Secondary | ICD-10-CM | POA: Insufficient documentation

## 2018-06-02 LAB — CMP (CANCER CENTER ONLY)
ALT: 13 U/L (ref 0–44)
AST: 12 U/L — ABNORMAL LOW (ref 15–41)
Albumin: 3.9 g/dL (ref 3.5–5.0)
Alkaline Phosphatase: 62 U/L (ref 38–126)
Anion gap: 11 (ref 5–15)
BUN: 16 mg/dL (ref 8–23)
CO2: 20 mmol/L — ABNORMAL LOW (ref 22–32)
Calcium: 9.6 mg/dL (ref 8.9–10.3)
Chloride: 108 mmol/L (ref 98–111)
Creatinine: 0.74 mg/dL (ref 0.44–1.00)
GFR, Est AFR Am: >60 mL/min (ref >60)
GFR, Estimated: >60 mL/min (ref >60)
Glucose, Bld: 207 mg/dL — ABNORMAL HIGH (ref 70–99)
Potassium: 4 mmol/L (ref 3.5–5.1)
Sodium: 139 mmol/L (ref 135–145)
Total Bilirubin: 0.5 mg/dL (ref 0.3–1.2)
Total Protein: 7.4 g/dL (ref 6.5–8.1)

## 2018-06-02 LAB — CBC WITH DIFFERENTIAL (CANCER CENTER ONLY)
Abs Immature Granulocytes: 0.19 10*3/uL — ABNORMAL HIGH (ref 0.00–0.07)
Basophils Absolute: 0 10*3/uL (ref 0.0–0.1)
Basophils Relative: 0 %
Eosinophils Absolute: 0 10*3/uL (ref 0.0–0.5)
Eosinophils Relative: 0 %
HCT: 40.8 % (ref 36.0–46.0)
Hemoglobin: 13 g/dL (ref 12.0–15.0)
Immature Granulocytes: 1 %
Lymphocytes Relative: 7 %
Lymphs Abs: 1.4 10*3/uL (ref 0.7–4.0)
MCH: 27.1 pg (ref 26.0–34.0)
MCHC: 31.9 g/dL (ref 30.0–36.0)
MCV: 85.2 fL (ref 80.0–100.0)
Monocytes Absolute: 0.2 10*3/uL (ref 0.1–1.0)
Monocytes Relative: 1 %
Neutro Abs: 17.2 10*3/uL — ABNORMAL HIGH (ref 1.7–7.7)
Neutrophils Relative %: 91 %
Platelet Count: 276 10*3/uL (ref 150–400)
RBC: 4.79 MIL/uL (ref 3.87–5.11)
RDW: 13.9 % (ref 11.5–15.5)
WBC Count: 18.9 10*3/uL — ABNORMAL HIGH (ref 4.0–10.5)
nRBC: 0 % (ref 0.0–0.2)

## 2018-06-02 MED ORDER — SODIUM CHLORIDE 0.9 % IV SOLN
Freq: Once | INTRAVENOUS | Status: AC
  Administered 2018-06-02: 09:00:00 via INTRAVENOUS
  Filled 2018-06-02: qty 250

## 2018-06-02 MED ORDER — FAMOTIDINE IN NACL 20-0.9 MG/50ML-% IV SOLN
INTRAVENOUS | Status: AC
  Filled 2018-06-02: qty 50

## 2018-06-02 MED ORDER — PALONOSETRON HCL INJECTION 0.25 MG/5ML
INTRAVENOUS | Status: AC
  Filled 2018-06-02: qty 5

## 2018-06-02 MED ORDER — DIPHENHYDRAMINE HCL 50 MG/ML IJ SOLN
50.0000 mg | Freq: Once | INTRAMUSCULAR | Status: AC
  Administered 2018-06-02: 50 mg via INTRAVENOUS

## 2018-06-02 MED ORDER — SODIUM CHLORIDE 0.9 % IV SOLN
175.0000 mg/m2 | Freq: Once | INTRAVENOUS | Status: AC
  Administered 2018-06-02: 300 mg via INTRAVENOUS
  Filled 2018-06-02: qty 50

## 2018-06-02 MED ORDER — SODIUM CHLORIDE 0.9 % IV SOLN
Freq: Once | INTRAVENOUS | Status: AC
  Administered 2018-06-02: 10:00:00 via INTRAVENOUS
  Filled 2018-06-02: qty 5

## 2018-06-02 MED ORDER — DIPHENHYDRAMINE HCL 50 MG/ML IJ SOLN
INTRAMUSCULAR | Status: AC
  Filled 2018-06-02: qty 1

## 2018-06-02 MED ORDER — PALONOSETRON HCL INJECTION 0.25 MG/5ML
0.2500 mg | Freq: Once | INTRAVENOUS | Status: AC
  Administered 2018-06-02: 0.25 mg via INTRAVENOUS

## 2018-06-02 MED ORDER — FAMOTIDINE IN NACL 20-0.9 MG/50ML-% IV SOLN
20.0000 mg | Freq: Once | INTRAVENOUS | Status: AC
  Administered 2018-06-02: 20 mg via INTRAVENOUS

## 2018-06-02 MED ORDER — SODIUM CHLORIDE 0.9 % IV SOLN
450.0000 mg | Freq: Once | INTRAVENOUS | Status: AC
  Administered 2018-06-02: 450 mg via INTRAVENOUS
  Filled 2018-06-02: qty 45

## 2018-06-02 NOTE — Patient Instructions (Signed)
[facility] Discharge Instructions for Patients Receiving Chemotherapy  Today you received the following chemotherapy agents Paclitaxel and Carboplatin   To help prevent nausea and vomiting after your treatment, we encourage you to take your nausea medication as prescribed.   If you develop nausea and vomiting that is not controlled by your nausea medication, call the clinic.   BELOW ARE SYMPTOMS THAT SHOULD BE REPORTED IMMEDIATELY:  *FEVER GREATER THAN 100.5 F  *CHILLS WITH OR WITHOUT FEVER  NAUSEA AND VOMITING THAT IS NOT CONTROLLED WITH YOUR NAUSEA MEDICATION  *UNUSUAL SHORTNESS OF BREATH  *UNUSUAL BRUISING OR BLEEDING  TENDERNESS IN MOUTH AND THROAT WITH OR WITHOUT PRESENCE OF ULCERS  *URINARY PROBLEMS  *BOWEL PROBLEMS  UNUSUAL RASH Items with * indicate a potential emergency and should be followed up as soon as possible.  Feel free to call the clinic should you have any questions or concerns. The clinic phone number is [phone].  Please show the [facility] at check-in to the Emergency Department and triage nurse.  Paclitaxel injection (Taxol) What is this medicine? PACLITAXEL (PAK li TAX el) is a chemotherapy drug. It targets fast dividing cells, like cancer cells, and causes these cells to die. This medicine is used to treat ovarian cancer, breast cancer, lung cancer, Kaposi's sarcoma, and other cancers. This medicine may be used for other purposes; ask your health care provider or pharmacist if you have questions. COMMON BRAND NAME(S): Onxol, Taxol What should I tell my health care provider before I take this medicine? They need to know if you have any of these conditions: -history of irregular heartbeat -liver disease -low blood counts, like low white cell, platelet, or red cell counts -lung or breathing disease, like asthma -tingling of the fingers or toes, or other nerve disorder -an unusual or allergic reaction to paclitaxel, alcohol,  polyoxyethylated castor oil, other chemotherapy, other medicines, foods, dyes, or preservatives -pregnant or trying to get pregnant -breast-feeding How should I use this medicine? This drug is given as an infusion into a vein. It is administered in a hospital or clinic by a specially trained health care professional. Talk to your pediatrician regarding the use of this medicine in children. Special care may be needed. Overdosage: If you think you have taken too much of this medicine contact a poison control center or emergency room at once. NOTE: This medicine is only for you. Do not share this medicine with others. What if I miss a dose? It is important not to miss your dose. Call your doctor or health care professional if you are unable to keep an appointment. What may interact with this medicine? Do not take this medicine with any of the following medications: -disulfiram -metronidazole This medicine may also interact with the following medications: -antiviral medicines for hepatitis, HIV or AIDS -certain antibiotics like erythromycin and clarithromycin -certain medicines for fungal infections like ketoconazole and itraconazole -certain medicines for seizures like carbamazepine, phenobarbital, phenytoin -gemfibrozil -nefazodone -rifampin -St. John's wort This list may not describe all possible interactions. Give your health care provider a list of all the medicines, herbs, non-prescription drugs, or dietary supplements you use. Also tell them if you smoke, drink alcohol, or use illegal drugs. Some items may interact with your medicine. What should I watch for while using this medicine? Your condition will be monitored carefully while you are receiving this medicine. You will need important blood work done while you are taking this medicine. This medicine can cause serious allergic reactions.  To reduce your risk you will need to take other medicine(s) before treatment with this medicine.  If you experience allergic reactions like skin rash, itching or hives, swelling of the face, lips, or tongue, tell your doctor or health care professional right away. In some cases, you may be given additional medicines to help with side effects. Follow all directions for their use. This drug may make you feel generally unwell. This is not uncommon, as chemotherapy can affect healthy cells as well as cancer cells. Report any side effects. Continue your course of treatment even though you feel ill unless your doctor tells you to stop. Call your doctor or health care professional for advice if you get a fever, chills or sore throat, or other symptoms of a cold or flu. Do not treat yourself. This drug decreases your body's ability to fight infections. Try to avoid being around people who are sick. This medicine may increase your risk to bruise or bleed. Call your doctor or health care professional if you notice any unusual bleeding. Be careful brushing and flossing your teeth or using a toothpick because you may get an infection or bleed more easily. If you have any dental work done, tell your dentist you are receiving this medicine. Avoid taking products that contain aspirin, acetaminophen, ibuprofen, naproxen, or ketoprofen unless instructed by your doctor. These medicines may hide a fever. Do not become pregnant while taking this medicine. Women should inform their doctor if they wish to become pregnant or think they might be pregnant. There is a potential for serious side effects to an unborn child. Talk to your health care professional or pharmacist for more information. Do not breast-feed an infant while taking this medicine. Men are advised not to father a child while receiving this medicine. This product may contain alcohol. Ask your pharmacist or healthcare provider if this medicine contains alcohol. Be sure to tell all healthcare providers you are taking this medicine. Certain medicines, like  metronidazole and disulfiram, can cause an unpleasant reaction when taken with alcohol. The reaction includes flushing, headache, nausea, vomiting, sweating, and increased thirst. The reaction can last from 30 minutes to several hours. What side effects may I notice from receiving this medicine? Side effects that you should report to your doctor or health care professional as soon as possible: -allergic reactions like skin rash, itching or hives, swelling of the face, lips, or tongue -breathing problems -changes in vision -fast, irregular heartbeat -high or low blood pressure -mouth sores -pain, tingling, numbness in the hands or feet -signs of decreased platelets or bleeding - bruising, pinpoint red spots on the skin, black, tarry stools, blood in the urine -signs of decreased red blood cells - unusually weak or tired, feeling faint or lightheaded, falls -signs of infection - fever or chills, cough, sore throat, pain or difficulty passing urine -signs and symptoms of liver injury like dark yellow or brown urine; general ill feeling or flu-like symptoms; light-colored stools; loss of appetite; nausea; right upper belly pain; unusually weak or tired; yellowing of the eyes or skin -swelling of the ankles, feet, hands -unusually slow heartbeat Side effects that usually do not require medical attention (report to your doctor or health care professional if they continue or are bothersome): -diarrhea -hair loss -loss of appetite -muscle or joint pain -nausea, vomiting -pain, redness, or irritation at site where injected -tiredness This list may not describe all possible side effects. Call your doctor for medical advice about side effects. You may report  side effects to FDA at 1-800-FDA-1088. Where should I keep my medicine? This drug is given in a hospital or clinic and will not be stored at home. NOTE: This sheet is a summary. It may not cover all possible information. If you have questions  about this medicine, talk to your doctor, pharmacist, or health care provider.  2019 Elsevier/Gold Standard (2016-12-14 13:14:55)  Carboplatin injection What is this medicine? CARBOPLATIN (KAR boe pla tin) is a chemotherapy drug. It targets fast dividing cells, like cancer cells, and causes these cells to die. This medicine is used to treat ovarian cancer and many other cancers. This medicine may be used for other purposes; ask your health care provider or pharmacist if you have questions. COMMON BRAND NAME(S): Paraplatin What should I tell my health care provider before I take this medicine? They need to know if you have any of these conditions: -blood disorders -hearing problems -kidney disease -recent or ongoing radiation therapy -an unusual or allergic reaction to carboplatin, cisplatin, other chemotherapy, other medicines, foods, dyes, or preservatives -pregnant or trying to get pregnant -breast-feeding How should I use this medicine? This drug is usually given as an infusion into a vein. It is administered in a hospital or clinic by a specially trained health care professional. Talk to your pediatrician regarding the use of this medicine in children. Special care may be needed. Overdosage: If you think you have taken too much of this medicine contact a poison control center or emergency room at once. NOTE: This medicine is only for you. Do not share this medicine with others. What if I miss a dose? It is important not to miss a dose. Call your doctor or health care professional if you are unable to keep an appointment. What may interact with this medicine? -medicines for seizures -medicines to increase blood counts like filgrastim, pegfilgrastim, sargramostim -some antibiotics like amikacin, gentamicin, neomycin, streptomycin, tobramycin -vaccines Talk to your doctor or health care professional before taking any of these  medicines: -acetaminophen -aspirin -ibuprofen -ketoprofen -naproxen This list may not describe all possible interactions. Give your health care provider a list of all the medicines, herbs, non-prescription drugs, or dietary supplements you use. Also tell them if you smoke, drink alcohol, or use illegal drugs. Some items may interact with your medicine. What should I watch for while using this medicine? Your condition will be monitored carefully while you are receiving this medicine. You will need important blood work done while you are taking this medicine. This drug may make you feel generally unwell. This is not uncommon, as chemotherapy can affect healthy cells as well as cancer cells. Report any side effects. Continue your course of treatment even though you feel ill unless your doctor tells you to stop. In some cases, you may be given additional medicines to help with side effects. Follow all directions for their use. Call your doctor or health care professional for advice if you get a fever, chills or sore throat, or other symptoms of a cold or flu. Do not treat yourself. This drug decreases your body's ability to fight infections. Try to avoid being around people who are sick. This medicine may increase your risk to bruise or bleed. Call your doctor or health care professional if you notice any unusual bleeding. Be careful brushing and flossing your teeth or using a toothpick because you may get an infection or bleed more easily. If you have any dental work done, tell your dentist you are receiving this medicine. Avoid  taking products that contain aspirin, acetaminophen, ibuprofen, naproxen, or ketoprofen unless instructed by your doctor. These medicines may hide a fever. Do not become pregnant while taking this medicine. Women should inform their doctor if they wish to become pregnant or think they might be pregnant. There is a potential for serious side effects to an unborn child. Talk to  your health care professional or pharmacist for more information. Do not breast-feed an infant while taking this medicine. What side effects may I notice from receiving this medicine? Side effects that you should report to your doctor or health care professional as soon as possible: -allergic reactions like skin rash, itching or hives, swelling of the face, lips, or tongue -signs of infection - fever or chills, cough, sore throat, pain or difficulty passing urine -signs of decreased platelets or bleeding - bruising, pinpoint red spots on the skin, black, tarry stools, nosebleeds -signs of decreased red blood cells - unusually weak or tired, fainting spells, lightheadedness -breathing problems -changes in hearing -changes in vision -chest pain -high blood pressure -low blood counts - This drug may decrease the number of white blood cells, red blood cells and platelets. You may be at increased risk for infections and bleeding. -nausea and vomiting -pain, swelling, redness or irritation at the injection site -pain, tingling, numbness in the hands or feet -problems with balance, talking, walking -trouble passing urine or change in the amount of urine Side effects that usually do not require medical attention (report to your doctor or health care professional if they continue or are bothersome): -hair loss -loss of appetite -metallic taste in the mouth or changes in taste This list may not describe all possible side effects. Call your doctor for medical advice about side effects. You may report side effects to FDA at 1-800-FDA-1088. Where should I keep my medicine? This drug is given in a hospital or clinic and will not be stored at home. NOTE: This sheet is a summary. It may not cover all possible information. If you have questions about this medicine, talk to your doctor, pharmacist, or health care provider.  2019 Elsevier/Gold Standard (2007-07-18 14:38:05)

## 2018-06-02 NOTE — Progress Notes (Signed)
Dr. Alvy Bimler aware of WBC 18.9. OK to treat.

## 2018-06-05 ENCOUNTER — Telehealth: Payer: Self-pay | Admitting: *Deleted

## 2018-06-05 ENCOUNTER — Telehealth: Payer: Self-pay | Admitting: Oncology

## 2018-06-05 NOTE — Telephone Encounter (Signed)
Called Misty Hopkins and asked if she had any night sweats over the weekend.  She said she had 1 or 2 but they were not really bad.  She said she is starting to have body aches from chemotherapy and is going to start taking her pain medication.  She really hasn't been bothered by the night sweats and did ask if they were from surgery.  Also advised her that Dr. Alvy Bimler is planing on port insertion before her second cycle to make sure her blood counts are ok.  She verbalized understanding.

## 2018-06-05 NOTE — Telephone Encounter (Signed)
TC from patient this am.  She states she had her 1st treatment of Taxol/carbo on 06/02/18.  She states she aches all over and wants to know if it is k to take her medication for this.  She has Oxycodone 5 mg left over from her recent surgery. Advised her that it is ok to take pain medicine as it it is prescribed. Advised she can also try Tylenol for mild pain. Patient states she had a hard time sleeping last night d/t pain and discomfort. No other side effects from chemo per patient. She has a f/u appt with Dr. Alvy Bimler on Friday, 06/09/18

## 2018-06-07 ENCOUNTER — Telehealth: Payer: Self-pay | Admitting: Genetics

## 2018-06-07 ENCOUNTER — Inpatient Hospital Stay (HOSPITAL_BASED_OUTPATIENT_CLINIC_OR_DEPARTMENT_OTHER): Payer: Medicare HMO | Admitting: Hematology and Oncology

## 2018-06-07 VITALS — BP 150/92 | HR 81 | Temp 98.3°F | Resp 18 | Ht 64.0 in | Wt 145.6 lb

## 2018-06-07 DIAGNOSIS — C5701 Malignant neoplasm of right fallopian tube: Secondary | ICD-10-CM

## 2018-06-07 DIAGNOSIS — T451X5A Adverse effect of antineoplastic and immunosuppressive drugs, initial encounter: Secondary | ICD-10-CM

## 2018-06-07 DIAGNOSIS — Z5111 Encounter for antineoplastic chemotherapy: Secondary | ICD-10-CM | POA: Diagnosis not present

## 2018-06-07 DIAGNOSIS — C786 Secondary malignant neoplasm of retroperitoneum and peritoneum: Secondary | ICD-10-CM | POA: Diagnosis not present

## 2018-06-07 DIAGNOSIS — C5702 Malignant neoplasm of left fallopian tube: Secondary | ICD-10-CM | POA: Diagnosis not present

## 2018-06-07 DIAGNOSIS — C7989 Secondary malignant neoplasm of other specified sites: Secondary | ICD-10-CM

## 2018-06-07 DIAGNOSIS — G620 Drug-induced polyneuropathy: Secondary | ICD-10-CM

## 2018-06-07 DIAGNOSIS — C562 Malignant neoplasm of left ovary: Secondary | ICD-10-CM

## 2018-06-07 DIAGNOSIS — K5909 Other constipation: Secondary | ICD-10-CM

## 2018-06-07 MED ORDER — POLYETHYLENE GLYCOL 3350 17 G PO PACK: 17.0000 g | PACK | Freq: Every day | ORAL | 1 refills | Status: DC

## 2018-06-07 MED ORDER — DULOXETINE HCL 20 MG PO CPEP: 20.0000 mg | ORAL_CAPSULE | Freq: Every day | ORAL | 3 refills | Status: DC

## 2018-06-07 MED ORDER — OXYCODONE HCL 10 MG PO TABS: 10.0000 mg | ORAL_TABLET | ORAL | 0 refills | Status: DC | PRN

## 2018-06-07 MED FILL — oxyCODONE HCL 10 MG TABS: 10 | 10 days supply | Qty: 60 | Fill #0

## 2018-06-07 MED FILL — DULOXETINE HCL 20 MG CPEP: 20 | 30 days supply | Qty: 30 | Fill #0

## 2018-06-07 NOTE — Patient Instructions (Signed)
1) Hold Trazodone (sleeping pill) 2) Take 2 tablets of extra strength tylenol (total 1000 mg) three times a day for next 5 days 3) Take 1 tablet of oxycodone 10 mg every 4 to 6 hours as needed for severe pain 4) Take cymbalta (new nerve pill for burning pain) as instructed 5) Take Miralax 1 packet daily for constipation 6) Take sennokot 2 pills twice daily for severe constipation. OK to stop once bowel movement is normal

## 2018-06-08 ENCOUNTER — Telehealth: Payer: Self-pay | Admitting: Hematology and Oncology

## 2018-06-08 ENCOUNTER — Encounter: Payer: Self-pay | Admitting: Hematology and Oncology

## 2018-06-08 DIAGNOSIS — K5909 Other constipation: Secondary | ICD-10-CM | POA: Insufficient documentation

## 2018-06-08 NOTE — Assessment & Plan Note (Signed)
She complained of some mild constipation.  We have extensive discussion about laxative therapy.  I warned her about increased risk of constipation with pain medicine.

## 2018-06-08 NOTE — Progress Notes (Signed)
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  I recommend aggressive supportive care.   I plan to reduce the dose of Taxol in the future by 50% Her abdominal wound is completely healed.  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  We also did discuss about component of neuropathic pain I will start her on Cymbalta I recommend her to hold off trazodone while on this medicine.  I will call her in a few days to assess efficacy   Orders Placed This Encounter  Procedures  . IR IMAGING GUIDED PORT INSERTION    Standing Status:   Future    Standing Expiration Date:   08/07/2019    Order Specific Question:   Reason for Exam (SYMPTOM  OR DIAGNOSIS REQUIRED)    Answer:   need port for chemo start 3/2, prefers end of the month for port placement    Order Specific Question:   Preferred Imaging Location?    Answer:   [facility]    INTERVAL HISTORY: Please see below for problem oriented charting. The patient is seen urgently at her request due to severe, uncontrolled peripheral neuropathy since chemotherapy Within a few days of chemotherapy, she started to complain of severe burning sensation in her feet along with tingling and numbness of her fingers.  The pain in her feet radiate all the way up to mid calf. It is so severe that she cannot stand on her legs.  It is interfering with her sleep She took some leftover oxycodone from her surgery and it only partially helped with the symptoms. She denies nausea.  She has some minor constipation. Her abdominal wound is healing well.  No fever or  chills.  SUMMARY OF ONCOLOGIC HISTORY: Oncology History   High grade serous, right fallopian tube  HRD, BRCA tumor testing were neg     Ovarian cancer ([place])   03/21/2018 Initial Diagnosis    Ovarian cancer ([place])     Fallopian tube cancer, carcinoma, right ([place])   10/24/2017 Initial Diagnosis    She has presentation of vague abdominal pain    02/20/2018 Imaging    Outside CT abdomen and pelvis: This revealed an incidental finding of a 1.5 cm subcapsular lesion in the lateral upper pole of the left kidney which measured higher than fluid attenuation which could represent a proteinaceous renal cyst or solid renal mass.  There was no ascites.  There is no lymphadenopathy.  There were no masses in the pelvis including ovarian or adnexal masses seen.  There was however soft tissue stranding seen with nodularity in the omental fat in the lower abdomen without evidence of ascites.  This was felt to represent either carcinomatosis or inflammatory infectious etiologies.  There was colonic diverticulosis seen without radiographic evidence of diverticulitis.  The radiologist recommended MRI of the abdomen to further work-up the renal mass    02/22/2018 Tumor Marker    Patient's tumor was tested for the following markers: CA-125. Results of the tumor marker test revealed 103.    03/06/2018 Pathology Results    Omentum, biopsy - ADENOCARCINOMA WITH PSAMMOMA BODIES, SEE COMMENT. Microscopic Comment There are scattered small foci  of malignant glands with associated psammoma bodies. While the tumor is somewhat limited the cells have a more low grade appearance. There is prominent lymphovascular invasion. Immunohistochemistry reveals the cells are positive for cytokeratin 7, ER, MOC31, PAX8, and WT-1. They are negative for calretinin, cytokeratin 20, CDX-2, and TTF-1. Overall, the findings are consistent with adenocarcinoma. The differential includes a primary gynecologic or peritoneal tumor, although the  morphology is not typical of a high grade serous carcinoma.    03/06/2018 Surgery    Surgeon: Donaciano Eva   Pre-operative Diagnosis: omental mass, elevated CA 125 Operation: Laparoscopic omentectomy  Surgeon: Donaciano Eva  Operative Findings:  : normal diaphragm and upper omentum. Sigmoid colon densely adherent to left pelvic side wall consistent with history of diverticulitis. Omentum adherent to anterior abdominal wall and sigmoid colon. Surgically absent uterus. Right ovary very small and grossly normal. Left ovary obscured by adhesed sigmoid colon. No ascites. No carcinomatosis. There was a nodular firm distal omentum on the left which was adherent to the sigmoid colon and most consistent with inflammatory change.       03/21/2018 Pathology Results    1. Soft tissue mass, simple excision, midline abdominal wall mass - METASTATIC SEROUS CARCINOMA. 2. Omentum, resection for tumor - METASTATIC SEROUS CARCINOMA. 3. Adnexa - ovary +/- tube, neoplastic, right - RIGHT FALLOPIAN TUBE: -HIGH GRADE SEROUS CARCINOMA. -SEE ONCOLOGY TABLE. - RIGHT OVARY: SURFACE PSAMMOMA BODIES. NO DEFINITIVE MALIGNANT CELLS. -INCLUSION CYSTS. 4. Adnexa - ovary +/- tube, neoplastic, left - LEFT FALLOPIAN TUBE: LUMINAL AND SURFACE SEROUS CARCINOMA. - LEFT OVARY: FOCAL PSAMMOMA BODIES AND MALIGNANT CELLS. 5. Soft tissue mass, simple excision, left abdominal wall - METASTATIC SEROUS CARCINOMA. Microscopic Comment 3. OVARY or FALLOPIAN TUBE or PRIMARY PERITONEUM: Procedure: Bilateral salpingo-oophorectomy. Abdominal wall mass excision and omentectomy. Specimen Integrity: Intact. Tumor Site: Right fallopian tube. Ovarian Surface Involvement (required only if applicable): Present (left). Fallopian Tube Surface Involvement (required only if applicable): Present. Tumor Size: 1.5 cm. Histologic Type: High grade serous carcinoma. Histologic Grade: High grade. Implants (required for  advanced stage serous/seromucinous borderline tumors only): Present. Other Tissue/ Organ Involvement: Omentum, abdominal wall, left fallopian tube and ovary. Largest Extrapelvic Peritoneal Focus (required only if applicable): >2 cm. Peritoneal/Ascitic Fluid: N/A. Treatment Effect (required only for high-grade serous carcinomas): N/A. Regional Lymph Nodes: No lymph nodes submitted or found Pathologic Stage Classification (pTNM, AJCC 8th Edition): pT3c, pNX Representative Tumor Block: 3A Comment(s): None.    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   Surgeon: Thereasa Solo, MD. Specimens: Bilateral tubes / ovaries, omentum. Midline abdominal wall mass, left lateral abdominal wall mass.    Operative Findings:  Abdominal wall masses consistent with port site metastases at the umbilical incision (7cm) and left lateral incision (5cm). Omental caking in the infracolic omentum. Grossly normal very small tubes and ovaries. Normal diaphragms. No lymphadenopathy. Normal small and large intestine with the exception of miliary studding of tumor on the surface of the sigmoid colon and rectum in the cul de sac.    This represented an optimal cytoreduction (R1) with no gross visible disease remaining, however there was a thin rind of tumor on the lateral left fascia associated with the port site metastasis.      04/11/2018 Cancer Staging    Staging form: Ovary, Fallopian Tube, and Primary Peritoneal Carcinoma, AJCC  8th Edition - Pathologic: FIGO Stage IIIC (pT3, pN0, cM0) - Signed by [name], MD on 04/11/2018    05/23/2018 Imaging    1. Interval resolution of the anterior midline abdominal wall seroma. There is some persistent wispy soft tissue attenuation in the region of the  midline incision, nonspecific and may simply reflect granulation tissue from surgery. 2. Stable 13 mm exophytic lesion posterior left kidney with attenuation higher than expected for a simple cyst. This may be a cyst complicated by proteinaceous debris or hemorrhage, but attention on follow-up recommended. 3. Stable mild persistent distention of proximal jejunal loops with gradual tapering to nondilated distal small bowel. 4. No overt peritoneal or mesenteric disease identified on this exam.    05/23/2018 Tumor Marker    Patient's tumor was tested for the following markers: CA-125 Results of the tumor marker test revealed 11.6    06/02/2018 -  Chemotherapy    The patient had carboplatin and taxol     REVIEW OF SYSTEMS:   Constitutional: Denies fevers, chills or abnormal weight loss Eyes: Denies blurriness of vision Ears, nose, mouth, throat, and face: Denies mucositis or sore throat Respiratory: Denies cough, dyspnea or wheezes Cardiovascular: Denies palpitation, chest discomfort or lower extremity swelling Skin: Denies abnormal skin rashes Lymphatics: Denies new lymphadenopathy or easy bruising Behavioral/Psych: Mood is stable, no new changes  All other systems were reviewed with the patient and are negative.  I have reviewed the past medical history, past surgical history, social history and family history with the patient and they are unchanged from previous note.  ALLERGIES:  is allergic to ivp dye [iodinated diagnostic agents]; propoxyphene; codeine; and ultram [tramadol hcl].  MEDICATIONS:  Current Outpatient Medications  Medication Sig Dispense Refill  . carboxymethylcellulose (REFRESH PLUS) 0.5 % SOLN Place 1 drop into both eyes 3 (three) times daily as needed (for dry eyes.).    Marland Kitchen Cholecalciferol (VITAMIN D3 SUPER STRENGTH) 50 MCG (2000 UT) TABS Take 2,000 Units by mouth daily.    Marland Kitchen dexamethasone (DECADRON) 4 MG tablet Take 2 tabs at the night before and 2 tab the morning of  chemotherapy, every 3 weeks, by mouth 24 tablet 0  . DULoxetine (CYMBALTA) 20 MG capsule Take 1 capsule (20 mg total) by mouth daily. 30 capsule 3  . lidocaine-prilocaine (EMLA) cream Apply to affected area once 30 g 3  . ondansetron (ZOFRAN) 8 MG tablet Take 1 tablet (8 mg total) by mouth every 8 (eight) hours as needed for refractory nausea / vomiting. Start on day 3 after chemo. 30 tablet 1  . oxyCODONE 10 MG TABS Take 1 tablet (10 mg total) by mouth every 4 (four) hours as needed for severe pain. 60 tablet 0  . polyethylene glycol (MIRALAX) packet Take 17 g by mouth daily. 14 each 1  . prochlorperazine (COMPAZINE) 10 MG tablet Take 1 tablet (10 mg total) by mouth every 6 (six) hours as needed (Nausea or vomiting). 30 tablet 1  . senna-docusate (SENOKOT-S) 8.6-50 MG tablet Take 2 tablets by mouth at bedtime. Hold if having loose stools 60 tablet 1  . traZODone (DESYREL) 50 MG tablet Take 50 mg by mouth at bedtime as needed for sleep.   0  . triamcinolone cream (KENALOG) 0.5 % Apply 1 application topically 2 (two) times daily. Apply to eyelids/eyebrows  4   No current facility-administered medications for this visit.     PHYSICAL EXAMINATION: ECOG PERFORMANCE STATUS: 2 - Symptomatic, <50% confined to bed  Vitals:  06/07/18 0834  BP: (!) 150/92  Pulse: 81  Resp: 18  Temp: 98.3 F (36.8 C)  SpO2: 99%   Filed Weights   06/07/18 0834  Weight: 145 lb 9.6 oz (66 kg)    GENERAL:alert, no distress and comfortable SKIN: skin color, texture, turgor are normal, no rashes or significant lesions EYES: normal, Conjunctiva are pink and non-injected, sclera clear OROPHARYNX:no exudate, no erythema and lips, buccal mucosa, and tongue normal  NECK: supple, thyroid normal size, non-tender, without nodularity LYMPH:  no palpable lymphadenopathy in the cervical, axillary or inguinal LUNGS: clear to auscultation and percussion with normal breathing effort HEART: regular rate & rhythm and no  murmurs and no lower extremity edema ABDOMEN:abdomen soft, non-tender and normal bowel sounds.  Her abdominal wound has completely healed Musculoskeletal:no cyanosis of digits and no clubbing  NEURO: alert & oriented x 3 with fluent speech, no focal motor/sensory deficits  LABORATORY DATA:  I have reviewed the data as listed    Component Value Date/Time   NA 139 06/02/2018 0811   K 4.0 06/02/2018 0811   CL 108 06/02/2018 0811   CO2 20 (L) 06/02/2018 0811   GLUCOSE 207 (H) 06/02/2018 0811   BUN 16 06/02/2018 0811   CREATININE 0.74 06/02/2018 0811   CALCIUM 9.6 06/02/2018 0811   PROT 7.4 06/02/2018 0811   ALBUMIN 3.9 06/02/2018 0811   AST 12 (L) 06/02/2018 0811   ALT 13 06/02/2018 0811   ALKPHOS 62 06/02/2018 0811   BILITOT 0.5 06/02/2018 0811   GFRNONAA >60 06/02/2018 0811   GFRAA >60 06/02/2018 0811    No results found for: SPEP, UPEP  Lab Results  Component Value Date   WBC 18.9 (H) 06/02/2018   NEUTROABS 17.2 (H) 06/02/2018   HGB 13.0 06/02/2018   HCT 40.8 06/02/2018   MCV 85.2 06/02/2018   PLT 276 06/02/2018      Chemistry      Component Value Date/Time   NA 139 06/02/2018 0811   K 4.0 06/02/2018 0811   CL 108 06/02/2018 0811   CO2 20 (L) 06/02/2018 0811   BUN 16 06/02/2018 0811   CREATININE 0.74 06/02/2018 0811      Component Value Date/Time   CALCIUM 9.6 06/02/2018 0811   ALKPHOS 62 06/02/2018 0811   AST 12 (L) 06/02/2018 0811   ALT 13 06/02/2018 0811   BILITOT 0.5 06/02/2018 0811       RADIOGRAPHIC STUDIES: I have personally reviewed the radiological images as listed and agreed with the findings in the report. Ct Chest W Contrast  Result Date: 05/23/2018 CLINICAL DATA:  Fallopian tube cancer. Slow healing abdominal wound concerning for recurrent disease at the site of surgery. EXAM: CT CHEST, ABDOMEN, AND PELVIS WITH CONTRAST TECHNIQUE: Multidetector CT imaging of the chest, abdomen and pelvis was performed following the standard protocol during  bolus administration of intravenous contrast. CONTRAST:  176m OMNIPAQUE IOHEXOL 300 MG/ML  [facility] FINDINGS: CT CHEST FINDINGS Cardiovascular: The heart size is normal. No substantial pericardial effusion. Coronary artery calcification is evident. No thoracic aortic aneurysm. Mediastinum/Nodes: No mediastinal lymphadenopathy. There is no hilar lymphadenopathy. The esophagus has normal imaging features. There is no axillary lymphadenopathy. Lungs/Pleura: The central tracheobronchial airways are patent. No suspicious pulmonary nodule or mass. No focal consolidation. No pleural effusion. Musculoskeletal: No worrisome lytic or sclerotic osseous abnormality. CT ABDOMEN PELVIS FINDINGS Hepatobiliary: No suspicious focal abnormality within the liver parenchyma. There is no evidence for gallstones, gallbladder  wall thickening, or pericholecystic fluid. No intrahepatic or extrahepatic biliary dilation. Pancreas: No focal mass lesion. No dilatation of the main duct. No intraparenchymal cyst. No peripancreatic edema. Spleen: No splenomegaly. No focal mass lesion. Adrenals/Urinary Tract: No adrenal nodule or mass. Right kidney unremarkable. 13 mm exophytic lesion posterior left kidney has attenuation higher than would be expected for a simple cyst. No evidence for hydroureter. The urinary bladder appears normal for the degree of distention. Stomach/Bowel: Stomach is unremarkable. No gastric wall thickening. No evidence of outlet obstruction. Duodenum is normally positioned as is the ligament of Treitz. No small bowel wall thickening. Similar appearance of mild proximal jejunal distention with mid and distal small bowel remaining nondistended. The terminal ileum is normal. The appendix is not visualized, but there is no edema or inflammation in the region of the cecum. Diverticuli are seen scattered along the entire length of the colon without CT findings of diverticulitis.  Vascular/Lymphatic: There is abdominal aortic atherosclerosis without aneurysm. There is no gastrohepatic or hepatoduodenal ligament lymphadenopathy. No intraperitoneal or retroperitoneal lymphadenopathy. No pelvic sidewall lymphadenopathy. Reproductive: Uterus surgically absent.  There is no adnexal mass. Other: No substantial intraperitoneal free fluid. Status post omentectomy. Musculoskeletal: The midline subcutaneous fluid collection identified in the anterior abdominal wall on the previous CT has resolved in the interval, compatible with clearance of a postoperative seroma. There is some residual wispy soft tissue attenuation in the midline incision, nonspecific. No worrisome lytic or sclerotic osseous abnormality. IMPRESSION: 1. Interval resolution of the anterior midline abdominal wall seroma. There is some persistent wispy soft tissue attenuation in the region of the midline incision, nonspecific and may simply reflect granulation tissue from surgery. 2. Stable 13 mm exophytic lesion posterior left kidney with attenuation higher than expected for a simple cyst. This may be a cyst complicated by proteinaceous debris or hemorrhage, but attention on follow-up recommended. 3. Stable mild persistent distention of proximal jejunal loops with gradual tapering to nondilated distal small bowel. 4. No overt peritoneal or mesenteric disease identified on this exam. Electronically Signed   By: Misty Stanley M.D.   On: 05/23/2018 18:44   Ct Abdomen Pelvis W Contrast  Result Date: 05/23/2018 CLINICAL DATA:  Fallopian tube cancer. Slow healing abdominal wound concerning for recurrent disease at the site of surgery. EXAM: CT CHEST, ABDOMEN, AND PELVIS WITH CONTRAST TECHNIQUE: Multidetector CT imaging of the chest, abdomen and pelvis was performed following the standard protocol during bolus administration of intravenous contrast. CONTRAST:  158m OMNIPAQUE IOHEXOL 300 MG/ML  [facility] FINDINGS: CT CHEST FINDINGS Cardiovascular: The heart size is normal. No substantial pericardial effusion. Coronary artery calcification is evident. No thoracic aortic aneurysm. Mediastinum/Nodes: No mediastinal lymphadenopathy. There is no hilar lymphadenopathy. The esophagus has normal imaging features. There is no axillary lymphadenopathy. Lungs/Pleura: The central tracheobronchial airways are patent. No suspicious pulmonary nodule or mass. No focal consolidation. No pleural effusion. Musculoskeletal: No worrisome lytic or sclerotic osseous abnormality. CT ABDOMEN PELVIS FINDINGS Hepatobiliary: No suspicious focal abnormality within the liver parenchyma. There is no evidence for gallstones, gallbladder wall thickening, or pericholecystic fluid. No intrahepatic or extrahepatic biliary dilation. Pancreas: No focal mass lesion. No dilatation of the main duct. No intraparenchymal cyst. No peripancreatic edema. Spleen: No splenomegaly. No focal mass lesion. Adrenals/Urinary Tract: No adrenal nodule or mass. Right kidney unremarkable. 13 mm exophytic lesion posterior left kidney has attenuation higher than would be expected for a simple cyst. No evidence for hydroureter. The urinary  bladder appears normal for the degree of distention. Stomach/Bowel: Stomach is unremarkable. No gastric wall thickening. No evidence of outlet obstruction. Duodenum is normally positioned as is the ligament of Treitz. No small bowel wall thickening. Similar appearance of mild proximal jejunal distention with mid and distal small bowel remaining nondistended. The terminal ileum is normal. The appendix is not visualized, but there is no edema or inflammation in the region of the cecum. Diverticuli are seen scattered along the entire length of the colon without CT findings of diverticulitis. Vascular/Lymphatic: There is abdominal aortic atherosclerosis without aneurysm. There is no gastrohepatic or hepatoduodenal ligament  lymphadenopathy. No intraperitoneal or retroperitoneal lymphadenopathy. No pelvic sidewall lymphadenopathy. Reproductive: Uterus surgically absent.  There is no adnexal mass. Other: No substantial intraperitoneal free fluid. Status post omentectomy. Musculoskeletal: The midline subcutaneous fluid collection identified in the anterior abdominal wall on the previous CT has resolved in the interval, compatible with clearance of a postoperative seroma. There is some residual wispy soft tissue attenuation in the midline incision, nonspecific. No worrisome lytic or sclerotic osseous abnormality. IMPRESSION: 1. Interval resolution of the anterior midline abdominal wall seroma. There is some persistent wispy soft tissue attenuation in the region of the midline incision, nonspecific and may simply reflect granulation tissue from surgery. 2. Stable 13 mm exophytic lesion posterior left kidney with attenuation higher than expected for a simple cyst. This may be a cyst complicated by proteinaceous debris or hemorrhage, but attention on follow-up recommended. 3. Stable mild persistent distention of proximal jejunal loops with gradual tapering to nondilated distal small bowel. 4. No overt peritoneal or mesenteric disease identified on this exam. Electronically Signed   By: Misty Stanley M.D.   On: 05/23/2018 18:44    All questions were answered. The patient knows to call the clinic with any problems, questions or concerns. No barriers to learning was detected.  I spent 25 minutes counseling the patient face to face. The total time spent in the appointment was 30 minutes and more than 50% was on counseling and review of test results  [name], MD 06/08/2018 3:16 PM

## 2018-06-08 NOTE — Assessment & Plan Note (Addendum)
She tolerated chemotherapy poorly with severe peripheral neuropathy.  I recommend aggressive supportive care.   I plan to reduce the dose of Taxol in the future by 50% Her abdominal wound is completely healed.  I will schedule port placement at the end of the month

## 2018-06-08 NOTE — Telephone Encounter (Signed)
No los °

## 2018-06-08 NOTE — Assessment & Plan Note (Signed)
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  We also did discuss about component of neuropathic pain I will start her on Cymbalta I recommend her to hold off trazodone while on this medicine.  I will call her in a few days to assess efficacy

## 2018-06-08 NOTE — Telephone Encounter (Signed)
Shared negative HRD/tumor testing results with patient.  No findings identified in her tumor that would impact treatment options.

## 2018-06-09 ENCOUNTER — Ambulatory Visit: Payer: Medicare HMO | Admitting: Hematology and Oncology

## 2018-06-15 ENCOUNTER — Other Ambulatory Visit: Payer: Self-pay | Admitting: Radiology

## 2018-06-15 ENCOUNTER — Ambulatory Visit: Payer: Self-pay | Admitting: Genetics

## 2018-06-15 ENCOUNTER — Encounter: Payer: Self-pay | Admitting: Genetics

## 2018-06-15 DIAGNOSIS — Z807 Family history of other malignant neoplasms of lymphoid, hematopoietic and related tissues: Secondary | ICD-10-CM

## 2018-06-15 DIAGNOSIS — C5701 Malignant neoplasm of right fallopian tube: Secondary | ICD-10-CM

## 2018-06-15 DIAGNOSIS — Z1379 Encounter for other screening for genetic and chromosomal anomalies: Secondary | ICD-10-CM

## 2018-06-15 DIAGNOSIS — C562 Malignant neoplasm of left ovary: Secondary | ICD-10-CM

## 2018-06-15 DIAGNOSIS — C569 Malignant neoplasm of unspecified ovary: Secondary | ICD-10-CM

## 2018-06-15 DIAGNOSIS — Z801 Family history of malignant neoplasm of trachea, bronchus and lung: Secondary | ICD-10-CM

## 2018-06-15 NOTE — Progress Notes (Signed)
HPI:  Ms. Equihua was previously seen in the [place] clinic on 05/02/2018 due to a personal and family history of cancer and concerns regarding a hereditary predisposition to cancer. Please refer to our prior cancer genetics clinic note for more information regarding Ms. Stroble's medical, social and family histories, and our assessment and recommendations, at the time. Ms. Stracener recent genetic test results were disclosed to her, as well as recommendations warranted by these results. These results and recommendations are discussed in more detail below.  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  There was no ascites.  There is no lymphadenopathy.  There were no masses in the pelvis including ovarian or adnexal masses seen.  There was however soft tissue stranding seen with nodularity in the omental fat in the lower abdomen without evidence of ascites.  This was felt to represent either carcinomatosis or inflammatory infectious etiologies.  There was colonic diverticulosis seen without radiographic evidence of diverticulitis.  The radiologist recommended MRI of the abdomen to further work-up the renal mass    02/22/2018 Tumor Marker    Patient's tumor was tested for the following markers: CA-125. Results of the tumor marker test revealed 103.    03/06/2018 Pathology Results    Omentum, biopsy - ADENOCARCINOMA WITH  PSAMMOMA BODIES, SEE COMMENT. Microscopic Comment There are scattered small foci of malignant glands with associated psammoma bodies. While the tumor is somewhat limited the cells have a more low grade appearance. There is prominent lymphovascular invasion. Immunohistochemistry reveals the cells are positive for cytokeratin 7, ER, MOC31, PAX8, and WT-1. They are negative for calretinin, cytokeratin 20, CDX-2, and TTF-1. Overall, the findings are consistent with adenocarcinoma. The differential includes a primary gynecologic or peritoneal tumor, although the morphology is not typical of a high grade serous carcinoma.    03/06/2018 Surgery    Surgeon: Donaciano Eva   Pre-operative Diagnosis: omental mass, elevated CA 125 Operation: Laparoscopic omentectomy  Surgeon: Donaciano Eva  Operative Findings:  : normal diaphragm and upper omentum. Sigmoid colon densely adherent to left pelvic side wall consistent with history of diverticulitis. Omentum adherent to anterior abdominal wall and sigmoid colon. Surgically absent uterus. Right ovary very small and grossly normal. Left ovary obscured by adhesed sigmoid colon. No ascites. No carcinomatosis. There was a nodular firm distal omentum on the left which was adherent to the sigmoid colon and most consistent with inflammatory change.       03/21/2018 Pathology Results    1. Soft tissue mass, simple excision, midline abdominal wall mass - METASTATIC SEROUS CARCINOMA. 2. Omentum, resection for tumor - METASTATIC SEROUS CARCINOMA. 3. Adnexa - ovary +/- tube, neoplastic, right - RIGHT FALLOPIAN TUBE: -HIGH GRADE SEROUS CARCINOMA. -SEE ONCOLOGY TABLE. - RIGHT OVARY: SURFACE PSAMMOMA BODIES. NO DEFINITIVE MALIGNANT CELLS. -INCLUSION CYSTS. 4. Adnexa - ovary +/- tube, neoplastic, left - LEFT FALLOPIAN TUBE:  LUMINAL AND SURFACE SEROUS CARCINOMA. - LEFT OVARY: FOCAL PSAMMOMA BODIES AND MALIGNANT CELLS. 5. Soft tissue mass, simple  excision, left abdominal wall - METASTATIC SEROUS CARCINOMA. Microscopic Comment 3. OVARY or FALLOPIAN TUBE or PRIMARY PERITONEUM: Procedure: Bilateral salpingo-oophorectomy. Abdominal wall mass excision and omentectomy. Specimen Integrity: Intact. Tumor Site: Right fallopian tube. Ovarian Surface Involvement (required only if applicable): Present (left). Fallopian Tube Surface Involvement (required only if applicable): Present. Tumor Size: 1.5 cm. Histologic Type: High grade serous carcinoma. Histologic Grade: High grade. Implants (required for advanced stage serous/seromucinous borderline tumors only): Present. Other Tissue/ Organ Involvement: Omentum, abdominal wall, left fallopian tube and ovary. Largest Extrapelvic Peritoneal Focus (required only if applicable): >2 cm. Peritoneal/Ascitic Fluid: N/A. Treatment Effect (required only for high-grade serous carcinomas): N/A. Regional Lymph Nodes: No lymph nodes submitted or found Pathologic Stage Classification (pTNM, AJCC 8th Edition): pT3c, pNX Representative Tumor Block: 3A Comment(s): None.    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   Surgeon: Thereasa Solo, MD. Specimens: Bilateral tubes / ovaries, omentum. Midline abdominal wall mass, left lateral abdominal wall mass.    Operative Findings:  Abdominal wall masses consistent with port site metastases at the umbilical incision (7cm) and left lateral incision (5cm). Omental caking in the infracolic omentum. Grossly normal very small tubes and ovaries. Normal diaphragms. No lymphadenopathy. Normal small and large intestine with the exception of miliary studding of tumor on the surface of the sigmoid colon and rectum in the cul de sac.    This represented an  optimal cytoreduction (R1) with no gross visible disease remaining, however there was a thin rind of tumor on the lateral left fascia associated with the port site metastasis.      04/11/2018 Cancer Staging    Staging form: Ovary, Fallopian Tube, and Primary Peritoneal Carcinoma, AJCC 8th Edition - Pathologic: FIGO Stage IIIC (pT3, pN0, cM0) - Signed by [name], MD on 04/11/2018    05/23/2018 Imaging    1. Interval resolution of the anterior midline abdominal wall seroma. There is some persistent wispy soft tissue attenuation in the region of the midline incision, nonspecific and may simply reflect granulation tissue from surgery. 2. Stable 13 mm exophytic lesion posterior left kidney with attenuation higher than expected for a simple cyst. This may be a cyst complicated by proteinaceous debris or hemorrhage, but attention on follow-up recommended. 3. Stable mild persistent distention of proximal jejunal loops with gradual tapering to nondilated distal small bowel. 4. No overt peritoneal or mesenteric disease identified on this exam.    05/23/2018 Tumor Marker    Patient's tumor was tested for the following markers: CA-125 Results of the tumor marker test revealed 11.6    06/02/2018 -  Chemotherapy    The patient had carboplatin and taxol      FAMILY HISTORY:  We obtained a detailed, 4-generation family history.  Significant diagnoses are listed below: Family History  Problem Relation Age of Onset  . Diabetes Mother   . Stroke Mother   . Non-Hodgkin's lymphoma Brother 15       exp to agent orange  . Heart disease Maternal Grandmother   . Lung cancer Maternal Grandfather   . Tuberculosis Paternal Grandmother     Ms. Souffrant has a 78 year-old son who has no hx of cancer.  She has 2 grandchildren ages 68  and 14.  Ms. Cando has 2 sister ages 42 and 90 alive with no hx of cancer. She has 1 brother who was dx with lymphoma at 36 and died at 39.  He was exposed to agent orange.   Ms.  Devore father: no hx of cancer.  Paternal Aunts/Uncles: 6 full aunts/uncles, 8 half aunts/uncles.  2 additional aunts/uncles who died as babies.  No known hx of cancer in these relatives, she has limited info about the half aunts/uncles.  Paternal cousins: no known hx of cancer, but limited information available about these relatives.  Paternal grandfather: deceased, no known hx of cancer.  Paternal grandmother:died young due to TB.  Ms. Holstad mother: died at 41 due to stroke and Diabetes.  Maternal Aunts/Uncles: 2 maternal aunts, 3 maternal uncles, no hx of cancer.  Maternal cousins: no known hx of cancer.  Maternal grandfather: died with lung cancer.  Maternal grandmother:died at 71, no hx of cancer. Heart disease.   Ms. Suhre is unaware of previous family history of genetic testing for hereditary cancer risks. Patient's maternal ancestors are of Caucasian/Native American descent, and paternal ancestors are of Caucasian descent. There is no is no reported Ashkenazi Jewish ancestry. There  known consanguinity.  GENETIC TEST RESULTS: Genetic testing performed through [employer].   Germline testing was performed through the [facility] gene panel that reported out on 05/17/2018.    The [facility] gene panel offered by [employer] includes sequencing and deletion/duplication testing of the following 35 genes: APC, ATM, BARD1, BMPR1A, BRCA1, BRCA2, BRIP1, CHD1, CDK4, CDKN2A, CHEK2, EPCAM (large rearrangement only), GREM1, HOXB13, AXIN2, MSH3, NTHL1, RNF43, GALNT12, RSP20, MLH1, MSH2, MSH6, MUTYH, NBN, PALB2, PMS2, PTEN, RAD51C, RAD51D, SMAD4, STK11, and TP53. Sequencing was performed for select regions of POLE and POLD1, and large rearrangement analysis was performed for select regions of GREM1.  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 c.1235A>G (p.Asn412Ser).     Somatic/Tumor HRD testing:  BRCA1/2 Status: NEGATIVE Genomic Instability Status: NEGATIVE  No findings were identified in the somatic HRD testing that would impact treatment options.     The test report will be scanned into EPIC and will be located under the Molecular Pathology section of the Results Review tab. A portion of the result report is included below for reference.   We discussed with Ms. Racz that because current genetic testing is not perfect, it is possible there may be a gene mutation in one of these genes that current testing cannot detect, but that chance is small.  We also discussed, that there could be another gene that has not yet been discovered, or that we have not yet tested, that is responsible for the cancer diagnoses in the family. It is also possible there is a hereditary cause for the cancer in the family that Ms. Goldfarb did not inherit and therefore was not identified in her testing.  Therefore, it is important to remain in touch with cancer genetics in the future so that we can continue to offer Ms. Asay the most up to date genetic testing.   Regarding the VUS in AXIN2: At this time, it is unknown if this variant is associated with increased cancer risk or if this is a normal finding, but  most variants such as this get reclassified to being inconsequential. It should not be used to make medical management decisions. With time, we suspect the lab will determine the significance of this variant, if any. If we do learn more about it, we will try to contact Ms. Gidley to discuss it further. However, it is important to stay in touch with [place] periodically and keep the address and phone number up to date.  ADDITIONAL GENETIC TESTING: We discussed with Ms. Sales that her genetic testing was  fairly extensive.  If there are are genes identified to increase cancer risk that can be analyzed in the future, we would be happy to discuss and coordinate this testing at that time.    MUTYH:  MUTYH- Associated Polyposis is an autosomal recessive condition that increases the risk to develop multiple colon/duodenal polyps and increases cancer risk.   We reviewed recessive inheritance and discussed when an individual has two MUTYH pathogenic mutations, this is associated with an increased risk for adenomatous (precancerous) colon polyps.      Based on current data, the risk for colon cancer/polyps in individuals who are MUTYH heterozygotes (only 1 mutation) is moderately increased at most.   Only One MUTYH pathogenic variant was detected in Ms. Kotz.  While this is reassuring, there is always a small chance that genetic testing did not detect all possible variants, as the technology is constantly evolving.    No other pathogenic variants were identified in any of the other genes analyzed on this panel. This result indicates that it is unlikely Ms. Rondon's cancer was due to a hereditary cancer predisposition syndrome.  Most cancers happen by chance and this negative test suggests that her cancer may fall into this category.  It is recommendedshecontinue to follow the cancer management and screening guidelines provided by her oncology and primary healthcare providers. Other factors such as her personal and family history may still affect his cancer risk  While reassuring, this does not definitively rule out a hereditary predisposition to cancer. It is still possible that there could be genetic mutations that are undetectable by current technology, or genetic mutations in genes that have not been tested or identified to increase cancer risk.  Therefore, it is recommended she continue to follow the cancer management and screening guidelines provided by her oncology and primary healthcare provider. An  individual's cancer risk is not determined by genetic test results alone.  Overall cancer risk assessment includes additional factors such as personal medical history, family history, etc.  These should be used to make a personalized plan for cancer prevention and surveillance.    SCREENING RECOMMENDATIONS:  We recommended Ms. Brunelli follow management guidelines for heterozygous MUTYH mutations; all of which are outlined below. These are from the NCCN 1.2018 guidelines, and are subject to change.    MUTYH heterozygote with no personal history of colon cancer and No family history of colon cancer: Data are uncertain if specialized screening is warranted.   MUTYH heterozygote with a first degree relative affected with colon cancer: Colonoscopy screening every 5 years beginning at 6 (or 10 years prior to age of 1st degree relative's colon cancer diagnosis)  It is important for [place] to follow general population colon screening guidelines, and any recommendations provided to her by her GI or other healthcare providers.     RECOMMENDATIONS FOR FAMILY MEMBERS:   We recommend all of Ringstad's family members have genetic counseling and genetic testing. Because the MUTYH pathogenic variant has been identified in  Ms. Lippard, we can offer genetic testing to relatives to determine if they also carry the MUTYH familial mutation.  As discussed above, having only 1 MUTYH mutation would not significantly impact their colon cancer risk/screening.  However, if any relatives have inherited 2 MUTYH mutations (in trans), this would significantly impact their cancer risk.  About 1-2% of the [place] population carries a single MUTYH pathogenic variant, so there is a chance some relatives may have inherited MUTYH mutations from both of their parents.  We would be happy to meet with any of the family members or refer them to a genetic counselor in their local area.  To locate genetic counselors in other cities,  individuals can visit the website of the [employer] (ArtistMovie.[email or web address]) and Secretary/administrator for a Social worker by zip code.   Relatives in this family might be at some increased risk of developing cancer, over the general population risk, simply due to the family history of cancer.  We recommended women in this family have a yearly mammogram beginning at age 21, or 81 years younger than the earliest onset of cancer, an annual clinical breast exam, and perform monthly breast self-exams. Women in this family should also have a gynecological exam as recommended by their primary provider. All family members should have a colonoscopy by age 58 (or as directed by their 54).  All family members should inform their physicians about the family history of cancer so their doctors can make the most appropriate screening recommendations for them.   FOLLOW-UP: Lastly, we discussed with Ms. Schreurs that cancer genetics is a rapidly advancing field and it is possible that new genetic tests will be appropriate for her and/or her family members in the future. We encouraged her to remain in contact with cancer genetics on an annual basis so we can update her personal and family histories and let her know of advances in cancer genetics that may benefit this family.   Our contact number was provided. Ms. Lopata questions were answered to her satisfaction, and she knows she is welcome to call us at anytime with additional questions or concerns.   Ferol Luz, MS, [facility] Certified Genetic Counselor [email or web address]

## 2018-06-16 ENCOUNTER — Other Ambulatory Visit: Payer: Self-pay | Admitting: Physician Assistant

## 2018-06-19 ENCOUNTER — Ambulatory Visit (HOSPITAL_COMMUNITY)
Admission: RE | Admit: 2018-06-19 | Discharge: 2018-06-19 | Disposition: A | Discharge status: Home / Self Care | Payer: Medicare HMO | Source: Ambulatory Visit | Attending: Hematology and Oncology | Admitting: Hematology and Oncology

## 2018-06-19 ENCOUNTER — Encounter (HOSPITAL_COMMUNITY): Payer: Self-pay

## 2018-06-19 ENCOUNTER — Other Ambulatory Visit: Payer: Self-pay

## 2018-06-19 ENCOUNTER — Ambulatory Visit (HOSPITAL_COMMUNITY)
Admission: RE | Admit: 2018-06-19 | Discharge: 2018-06-19 | Disposition: A | Discharge status: Home / Self Care | Payer: Medicare HMO | Source: Ambulatory Visit

## 2018-06-19 DIAGNOSIS — C5701 Malignant neoplasm of right fallopian tube: Secondary | ICD-10-CM

## 2018-06-19 DIAGNOSIS — Z888 Allergy status to other drugs, medicaments and biological substances status: Secondary | ICD-10-CM | POA: Diagnosis not present

## 2018-06-19 DIAGNOSIS — Z91041 Radiographic dye allergy status: Secondary | ICD-10-CM | POA: Insufficient documentation

## 2018-06-19 DIAGNOSIS — Z801 Family history of malignant neoplasm of trachea, bronchus and lung: Secondary | ICD-10-CM | POA: Diagnosis not present

## 2018-06-19 DIAGNOSIS — Z90711 Acquired absence of uterus with remaining cervical stump: Secondary | ICD-10-CM | POA: Insufficient documentation

## 2018-06-19 DIAGNOSIS — Z807 Family history of other malignant neoplasms of lymphoid, hematopoietic and related tissues: Secondary | ICD-10-CM | POA: Insufficient documentation

## 2018-06-19 DIAGNOSIS — K219 Gastro-esophageal reflux disease without esophagitis: Secondary | ICD-10-CM | POA: Diagnosis not present

## 2018-06-19 DIAGNOSIS — Z885 Allergy status to narcotic agent status: Secondary | ICD-10-CM | POA: Insufficient documentation

## 2018-06-19 DIAGNOSIS — Z79899 Other long term (current) drug therapy: Secondary | ICD-10-CM | POA: Insufficient documentation

## 2018-06-19 HISTORY — PX: IR IMAGING GUIDED PORT INSERTION: IMG5740

## 2018-06-19 LAB — CBC
HCT: 40 % (ref 36.0–46.0)
Hemoglobin: 12.6 g/dL (ref 12.0–15.0)
MCH: 27.9 pg (ref 26.0–34.0)
MCHC: 31.5 g/dL (ref 30.0–36.0)
MCV: 88.5 fL (ref 80.0–100.0)
Platelets: 203 10*3/uL (ref 150–400)
RBC: 4.52 MIL/uL (ref 3.87–5.11)
RDW: 14.4 % (ref 11.5–15.5)
WBC: 6.4 10*3/uL (ref 4.0–10.5)
nRBC: 0 % (ref 0.0–0.2)

## 2018-06-19 LAB — PROTIME-INR
INR: 1
PROTHROMBIN TIME: 12.6 s (ref 11.4–15.2)

## 2018-06-19 LAB — BASIC METABOLIC PANEL
Anion gap: 7 (ref 5–15)
BUN: 16 mg/dL (ref 8–23)
CO2: 24 mmol/L (ref 22–32)
Calcium: 9 mg/dL (ref 8.9–10.3)
Chloride: 110 mmol/L (ref 98–111)
Creatinine, Ser: 0.63 mg/dL (ref 0.44–1.00)
GFR calc Af Amer: >60 mL/min (ref >60)
Glucose, Bld: 96 mg/dL (ref 70–99)
Potassium: 3.9 mmol/L (ref 3.5–5.1)
Sodium: 141 mmol/L (ref 135–145)

## 2018-06-19 MED ORDER — HEPARIN SOD (PORK) LOCK FLUSH 100 UNIT/ML IV SOLN
INTRAVENOUS | Status: AC
  Filled 2018-06-19: qty 5

## 2018-06-19 MED ORDER — CEFAZOLIN SODIUM-DEXTROSE 2-4 GM/100ML-% IV SOLN
INTRAVENOUS | Status: AC
  Administered 2018-06-19: 2 g via INTRAVENOUS
  Filled 2018-06-19: qty 100

## 2018-06-19 MED ORDER — CEFAZOLIN SODIUM-DEXTROSE 2-4 GM/100ML-% IV SOLN
2.0000 g | INTRAVENOUS | Status: AC
  Administered 2018-06-19: 2 g via INTRAVENOUS

## 2018-06-19 MED ORDER — FENTANYL CITRATE (PF) 100 MCG/2ML IJ SOLN
INTRAMUSCULAR | Status: AC | PRN
  Administered 2018-06-19 (×2): 50 ug via INTRAVENOUS

## 2018-06-19 MED ORDER — LIDOCAINE HCL 1 % IJ SOLN
INTRAMUSCULAR | Status: AC
  Filled 2018-06-19: qty 20

## 2018-06-19 MED ORDER — MIDAZOLAM HCL 2 MG/2ML IJ SOLN
INTRAMUSCULAR | Status: AC | PRN
  Administered 2018-06-19 (×2): 1 mg via INTRAVENOUS

## 2018-06-19 MED ORDER — LIDOCAINE HCL (PF) 1 % IJ SOLN
INTRAMUSCULAR | Status: AC | PRN
  Administered 2018-06-19 (×2): 10 mL

## 2018-06-19 MED ORDER — MIDAZOLAM HCL 2 MG/2ML IJ SOLN
INTRAMUSCULAR | Status: AC
  Filled 2018-06-19: qty 2

## 2018-06-19 MED ORDER — SODIUM CHLORIDE 0.9 % IV SOLN
INTRAVENOUS | Status: DC
  Administered 2018-06-19: 12:00:00 via INTRAVENOUS

## 2018-06-19 MED ORDER — FENTANYL CITRATE (PF) 100 MCG/2ML IJ SOLN
INTRAMUSCULAR | Status: AC
  Filled 2018-06-19: qty 2

## 2018-06-19 MED ORDER — HEPARIN SOD (PORK) LOCK FLUSH 100 UNIT/ML IV SOLN
INTRAVENOUS | Status: AC | PRN
  Administered 2018-06-19: 500 [IU]

## 2018-06-19 NOTE — Consult Note (Signed)
Chief Complaint: Patient was seen in consultation today for port a cath placement   Referring Physician(s): [place]  Supervising Physician: Corrie Mckusick  Patient Status: [name]  History of Present Illness: Misty Hopkins is a 78 y.o. female with history of newly diagnosed right fallopian tube carcinoma, status post surgery and initiation of chemotherapy.  She has poor venous access and presents today for Port-A-Cath placement for additional chemotherapy.  Past Medical History:  Diagnosis Date  . Complication of anesthesia    Difficult to put to sleep  . Family history of lung cancer   . Family history of lymphoma   . GERD (gastroesophageal reflux disease)     Past Surgical History:  Procedure Laterality Date  . Bladder Lift  2000  . DEBULKING N/A 03/21/2018   Procedure: RADICAL TUMOR DEBULKING;  Surgeon: Everitt Amber, MD;  Location: [facility];  Service: Gynecology;  Laterality: N/A;  . EYE SURGERY  2017   Cataracts (Bilateral)  . LAPAROSCOPIC SALPINGO OOPHERECTOMY Bilateral 03/21/2018   Procedure: SALPINGO OOPHORECTOMY;  Surgeon: Everitt Amber, MD;  Location: [facility];  Service: Gynecology;  Laterality: Bilateral;  . LAPAROSCOPY N/A 03/06/2018   Procedure: LAPAROSCOPY DIAGNOSTIC OMENTAL BIOPSY;  Surgeon: Everitt Amber, MD;  Location: [facility];  Service: Gynecology;  Laterality: N/A;  . LAPAROTOMY N/A 03/21/2018   Procedure: EXPLORATORY LAPAROTOMY, INCISIONAL HERNIA REPAIR;  Surgeon: Everitt Amber, MD;  Location: [facility];  Service: Gynecology;  Laterality: N/A;  . OMENTECTOMY N/A 03/21/2018   Procedure: OMENTECTOMY;  Surgeon: Everitt Amber, MD;  Location: [facility];  Service: Gynecology;  Laterality: N/A;  . PARTIAL HYSTERECTOMY     40 years ago  . Rectum Lift  2001  . TONSILLECTOMY      Allergies: Ivp dye [iodinated diagnostic agents]; Propoxyphene; Codeine; and Ultram [tramadol hcl]  Medications: Prior to Admission medications   Medication Sig  Start Date End Date Taking? Authorizing Provider  carboxymethylcellulose (REFRESH PLUS) 0.5 % SOLN Place 1 drop into both eyes 3 (three) times daily as needed (for dry eyes.).   Yes [provider]  Cholecalciferol (VITAMIN D3 SUPER STRENGTH) 50 MCG (2000 UT) TABS Take 2,000 Units by mouth daily.   Yes [provider]  dexamethasone (DECADRON) 4 MG tablet Take 2 tabs at the night before and 2 tab the morning of chemotherapy, every 3 weeks, by mouth 05/24/18  Yes Gorsuch, Ni, MD  oxyCODONE 10 MG TABS Take 1 tablet (10 mg total) by mouth every 4 (four) hours as needed for severe pain. 06/07/18  Yes Gorsuch, Ni, MD  polyethylene glycol (MIRALAX) packet Take 17 g by mouth daily. 06/07/18  Yes Gorsuch, Ni, MD  senna-docusate (SENOKOT-S) 8.6-50 MG tablet Take 2 tablets by mouth at bedtime. Hold if having loose stools 03/15/18  Yes Cross, Melissa D, NP  traZODone (DESYREL) 50 MG tablet Take 50 mg by mouth at bedtime as needed for sleep.  02/23/18  Yes [provider]  triamcinolone cream (KENALOG) 0.5 % Apply 1 application topically 2 (two) times daily. Apply to eyelids/eyebrows 02/16/18  Yes [provider]  DULoxetine (CYMBALTA) 20 MG capsule Take 1 capsule (20 mg total) by mouth daily. 06/07/18   [name], MD  lidocaine-prilocaine (EMLA) cream Apply to affected area once 05/24/18   [name], MD  ondansetron (ZOFRAN) 8 MG tablet Take 1 tablet (8 mg total) by mouth every 8 (eight) hours as needed for refractory nausea / vomiting. Start on day 3 after chemo. 05/24/18  [name], MD  prochlorperazine (COMPAZINE) 10 MG tablet Take 1 tablet (10 mg total) by mouth every 6 (six) hours as needed (Nausea or vomiting). 05/24/18   [name], MD     Family History  Problem Relation Age of Onset  . Diabetes Mother   . Stroke Mother   . Non-Hodgkin's lymphoma Brother 23       exp to agent orange  . Heart disease Maternal Grandmother   . Lung cancer Maternal Grandfather   .  Tuberculosis Paternal Grandmother     Social History   Socioeconomic History  . Marital status: Married    Spouse name: Not on file  . Number of children: Not on file  . Years of education: Not on file  . Highest education level: Not on file  Occupational History  . Not on file  Social Needs  . Financial resource strain: Not on file  . Food insecurity:    Worry: Not on file    Inability: Not on file  . Transportation needs:    Medical: Not on file    Non-medical: Not on file  Tobacco Use  . Smoking status: Never Smoker  . Smokeless tobacco: Never Used  Substance and Sexual Activity  . Alcohol use: Never    Frequency: Never  . Drug use: Never  . Sexual activity: Not on file  Lifestyle  . Physical activity:    Days per week: Not on file    Minutes per session: Not on file  . Stress: Not on file  Relationships  . Social connections:    Talks on phone: Not on file    Gets together: Not on file    Attends religious service: Not on file    Active member of club or organization: Not on file    Attends meetings of clubs or organizations: Not on file    Relationship status: Not on file  Other Topics Concern  . Not on file  Social History Narrative  . Not on file      Review of Systems denies fever, headache, chest pain, dyspnea, cough, back pain, nausea, vomiting or bleeding.  She does have some mild abdominal soreness and lower extremity neuropathy  Vital Signs: Blood pressure 164/72, heart rate 75, respirations 16, O2 sat 99% room air, temp 98.7   Physical Exam awake, alert.  Chest clear to auscultation bilaterally.  Heart with regular rate and rhythm.  Abdomen soft, positive bowel sounds, some mild generalized tenderness to palpation.  No lower extremity edema.  Imaging: Ct Chest W Contrast  Result Date: 05/23/2018 CLINICAL DATA:  Fallopian tube cancer. Slow healing abdominal wound concerning for recurrent disease at the site of surgery. EXAM: CT CHEST, ABDOMEN,  AND PELVIS WITH CONTRAST TECHNIQUE: Multidetector CT imaging of the chest, abdomen and pelvis was performed following the standard protocol during bolus administration of intravenous contrast. CONTRAST:  149mL OMNIPAQUE IOHEXOL 300 MG/ML  SOLN COMPARISON:  [facility] CT exam from 03/30/2018. FINDINGS: CT CHEST FINDINGS Cardiovascular: The heart size is normal. No substantial pericardial effusion. Coronary artery calcification is evident. No thoracic aortic aneurysm. Mediastinum/Nodes: No mediastinal lymphadenopathy. There is no hilar lymphadenopathy. The esophagus has normal imaging features. There is no axillary lymphadenopathy. Lungs/Pleura: The central tracheobronchial airways are patent. No suspicious pulmonary nodule or mass. No focal consolidation. No pleural effusion. Musculoskeletal: No worrisome lytic or sclerotic osseous abnormality. CT ABDOMEN PELVIS FINDINGS Hepatobiliary: No suspicious focal abnormality within the liver parenchyma. There is no evidence for gallstones, gallbladder  wall thickening, or pericholecystic fluid. No intrahepatic or extrahepatic biliary dilation. Pancreas: No focal mass lesion. No dilatation of the main duct. No intraparenchymal cyst. No peripancreatic edema. Spleen: No splenomegaly. No focal mass lesion. Adrenals/Urinary Tract: No adrenal nodule or mass. Right kidney unremarkable. 13 mm exophytic lesion posterior left kidney has attenuation higher than would be expected for a simple cyst. No evidence for hydroureter. The urinary bladder appears normal for the degree of distention. Stomach/Bowel: Stomach is unremarkable. No gastric wall thickening. No evidence of outlet obstruction. Duodenum is normally positioned as is the ligament of Treitz. No small bowel wall thickening. Similar appearance of mild proximal jejunal distention with mid and distal small bowel remaining nondistended. The terminal ileum is normal. The appendix is not visualized, but there is no edema or  inflammation in the region of the cecum. Diverticuli are seen scattered along the entire length of the colon without CT findings of diverticulitis. Vascular/Lymphatic: There is abdominal aortic atherosclerosis without aneurysm. There is no gastrohepatic or hepatoduodenal ligament lymphadenopathy. No intraperitoneal or retroperitoneal lymphadenopathy. No pelvic sidewall lymphadenopathy. Reproductive: Uterus surgically absent.  There is no adnexal mass. Other: No substantial intraperitoneal free fluid. Status post omentectomy. Musculoskeletal: The midline subcutaneous fluid collection identified in the anterior abdominal wall on the previous CT has resolved in the interval, compatible with clearance of a postoperative seroma. There is some residual wispy soft tissue attenuation in the midline incision, nonspecific. No worrisome lytic or sclerotic osseous abnormality. IMPRESSION: 1. Interval resolution of the anterior midline abdominal wall seroma. There is some persistent wispy soft tissue attenuation in the region of the midline incision, nonspecific and may simply reflect granulation tissue from surgery. 2. Stable 13 mm exophytic lesion posterior left kidney with attenuation higher than expected for a simple cyst. This may be a cyst complicated by proteinaceous debris or hemorrhage, but attention on follow-up recommended. 3. Stable mild persistent distention of proximal jejunal loops with gradual tapering to nondilated distal small bowel. 4. No overt peritoneal or mesenteric disease identified on this exam. Electronically Signed   By: Misty Stanley M.D.   On: 05/23/2018 18:44   Ct Abdomen Pelvis W Contrast  Result Date: 05/23/2018 CLINICAL DATA:  Fallopian tube cancer. Slow healing abdominal wound concerning for recurrent disease at the site of surgery. EXAM: CT CHEST, ABDOMEN, AND PELVIS WITH CONTRAST TECHNIQUE: Multidetector CT imaging of the chest, abdomen and pelvis was performed following the standard  protocol during bolus administration of intravenous contrast. CONTRAST:  142mL OMNIPAQUE IOHEXOL 300 MG/ML  SOLN COMPARISON:  [facility] CT exam from 03/30/2018. FINDINGS: CT CHEST FINDINGS Cardiovascular: The heart size is normal. No substantial pericardial effusion. Coronary artery calcification is evident. No thoracic aortic aneurysm. Mediastinum/Nodes: No mediastinal lymphadenopathy. There is no hilar lymphadenopathy. The esophagus has normal imaging features. There is no axillary lymphadenopathy. Lungs/Pleura: The central tracheobronchial airways are patent. No suspicious pulmonary nodule or mass. No focal consolidation. No pleural effusion. Musculoskeletal: No worrisome lytic or sclerotic osseous abnormality. CT ABDOMEN PELVIS FINDINGS Hepatobiliary: No suspicious focal abnormality within the liver parenchyma. There is no evidence for gallstones, gallbladder wall thickening, or pericholecystic fluid. No intrahepatic or extrahepatic biliary dilation. Pancreas: No focal mass lesion. No dilatation of the main duct. No intraparenchymal cyst. No peripancreatic edema. Spleen: No splenomegaly. No focal mass lesion. Adrenals/Urinary Tract: No adrenal nodule or mass. Right kidney unremarkable. 13 mm exophytic lesion posterior left kidney has attenuation higher than would be expected for a simple cyst. No evidence for hydroureter. The urinary  bladder appears normal for the degree of distention. Stomach/Bowel: Stomach is unremarkable. No gastric wall thickening. No evidence of outlet obstruction. Duodenum is normally positioned as is the ligament of Treitz. No small bowel wall thickening. Similar appearance of mild proximal jejunal distention with mid and distal small bowel remaining nondistended. The terminal ileum is normal. The appendix is not visualized, but there is no edema or inflammation in the region of the cecum. Diverticuli are seen scattered along the entire length of the colon without CT findings of  diverticulitis. Vascular/Lymphatic: There is abdominal aortic atherosclerosis without aneurysm. There is no gastrohepatic or hepatoduodenal ligament lymphadenopathy. No intraperitoneal or retroperitoneal lymphadenopathy. No pelvic sidewall lymphadenopathy. Reproductive: Uterus surgically absent.  There is no adnexal mass. Other: No substantial intraperitoneal free fluid. Status post omentectomy. Musculoskeletal: The midline subcutaneous fluid collection identified in the anterior abdominal wall on the previous CT has resolved in the interval, compatible with clearance of a postoperative seroma. There is some residual wispy soft tissue attenuation in the midline incision, nonspecific. No worrisome lytic or sclerotic osseous abnormality. IMPRESSION: 1. Interval resolution of the anterior midline abdominal wall seroma. There is some persistent wispy soft tissue attenuation in the region of the midline incision, nonspecific and may simply reflect granulation tissue from surgery. 2. Stable 13 mm exophytic lesion posterior left kidney with attenuation higher than expected for a simple cyst. This may be a cyst complicated by proteinaceous debris or hemorrhage, but attention on follow-up recommended. 3. Stable mild persistent distention of proximal jejunal loops with gradual tapering to nondilated distal small bowel. 4. No overt peritoneal or mesenteric disease identified on this exam. Electronically Signed   By: Misty Stanley M.D.   On: 05/23/2018 18:44    Labs:  CBC: Recent Labs    03/23/18 0459 05/23/18 1211 06/02/18 0811 06/19/18 1221  WBC 14.0* 13.3* 18.9* 6.4  HGB 10.2* 12.9 13.0 12.6  HCT 33.3* 41.5 40.8 40.0  PLT 186 262 276 203    COAGS: Recent Labs    06/19/18 1221  INR 1.0    BMP: Recent Labs    03/23/18 0459 05/23/18 1211 06/02/18 0811 06/19/18 1221  NA 140 142 139 141  K 4.5 4.0 4.0 3.9  CL 112* 105 108 110  CO2 23 26 20* 24  GLUCOSE 101* 148* 207* 96  BUN 15 16 16 16     CALCIUM 8.0* 9.7 9.6 9.0  CREATININE 0.64 0.74 0.74 0.63  GFRNONAA >60 >60 >60 >60  GFRAA >60 >60 >60 >60    LIVER FUNCTION TESTS: Recent Labs    03/03/18 0826 03/15/18 0812 05/23/18 1211 06/02/18 0811  BILITOT 0.5 0.7 0.4 0.5  AST 24 17 12* 12*  ALT 19 14 13 13   ALKPHOS 45 45 62 62  PROT 7.1 7.0 7.7 7.4  ALBUMIN 4.3 4.1 4.1 3.9    TUMOR MARKERS: No results for input(s): AFPTM, CEA, CA199, CHROMGRNA in the last 8760 hours.  Assessment and Plan:  78 y.o. female with history of newly diagnosed right fallopian tube carcinoma, status post surgery and initiation of chemotherapy.  She has poor venous access and presents today for Port-A-Cath placement for additional chemotherapy.Risks and benefits of image guided port-a-catheter placement was discussed with the patient/spouse including, but not limited to bleeding, infection, pneumothorax, or fibrin sheath development and need for additional procedures.  All of the patient's questions were answered, patient is agreeable to proceed. Consent signed and in chart.     Thank you for this interesting consult.  I greatly enjoyed meeting Misty Hopkins Lovilia and look forward to participating in their care.  A copy of this report was sent to the requesting provider on this date.  Electronically Signed: D. Rowe Robert, PA-C 06/19/2018, 1:01 PM   I spent a total of 25 minutes   in face to face in clinical consultation, greater than 50% of which was counseling/coordinating care for port a cath placement

## 2018-06-19 NOTE — Procedures (Signed)
Interventional Radiology Procedure Note  Procedure: Placement of a right IJ approach single lumen PowerPort.  Tip is positioned at the superior cavoatrial junction and catheter is ready for immediate use.  Complications: None Recommendations:  - Ok to shower tomorrow - Do not submerge for 7 days - Routine line care   Signed,  Dael Howland S. Jazmen Lindenbaum, DO   

## 2018-06-19 NOTE — Discharge Instructions (Signed)
You may shower and remove your dressing tomorrow.  DO NOT use EMLA cream for 2 weeks after port placement as this cream will remove surgical glue on your incision.  Moderate Conscious Sedation, Adult, Care After These instructions provide you with information about caring for yourself after your procedure. Your health care provider may also give you more specific instructions. Your treatment has been planned according to current medical practices, but problems sometimes occur. Call your health care provider if you have any problems or questions after your procedure. What can I expect after the procedure? After your procedure, it is common:  To feel sleepy for several hours.  To feel clumsy and have poor balance for several hours.  To have poor judgment for several hours.  To vomit if you eat too soon. Follow these instructions at home: For at least 24 hours after the procedure:   Do not: ? Participate in activities where you could fall or become injured. ? Drive. ? Use heavy machinery. ? Drink alcohol. ? Take sleeping pills or medicines that cause drowsiness. ? Make important decisions or sign legal documents. ? Take care of children on your own.  Rest. Eating and drinking  Follow the diet recommended by your health care provider.  If you vomit: ? Drink water, juice, or soup when you can drink without vomiting. ? Make sure you have little or no nausea before eating solid foods. General instructions  Have a responsible adult stay with you until you are awake and alert.  Take over-the-counter and prescription medicines only as told by your health care provider.  If you smoke, do not smoke without supervision.  Keep all follow-up visits as told by your health care provider. This is important. Contact a health care provider if:  You keep feeling nauseous or you keep vomiting.  You feel light-headed.  You develop a rash.  You have a fever. Get help right away  if:  You have trouble breathing. This information is not intended to replace advice given to you by your health care provider. Make sure you discuss any questions you have with your health care provider. Document Released: 01/31/2013 Document Revised: 09/15/2015 Document Reviewed: 08/02/2015 Elsevier Interactive Patient Education  2019 [place] An implanted port is a device that is placed under the skin. It is usually placed in the chest. The device can be used to give IV medicine, to take blood, or for dialysis. You may have an implanted port if:  You need IV medicine that would be irritating to the small veins in your hands or arms.  You need IV medicines, such as antibiotics, for a long period of time.  You need IV nutrition for a long period of time.  You need dialysis. Having a port means that your health care provider will not need to use the veins in your arms for these procedures. You may have fewer limitations when using a port than you would if you used other types of long-term IVs, and you will likely be able to return to normal activities after your incision heals. An implanted port has two main parts:  Reservoir. The reservoir is the part where a needle is inserted to give medicines or draw blood. The reservoir is round. After it is placed, it appears as a small, raised area under your skin.  Catheter. The catheter is a thin, flexible tube that connects the reservoir to a vein. Medicine that is inserted into the reservoir  goes into the catheter and then into the vein. How is my port accessed? To access your port:  A numbing cream may be placed on the skin over the port site.  Your health care provider will put on a mask and sterile gloves.  The skin over your port will be cleaned carefully with a germ-killing soap and allowed to dry.  Your health care provider will gently pinch the port and insert a needle into it.  Your health care  provider will check for a blood return to make sure the port is in the vein and is not clogged.  If your port needs to remain accessed to get medicine continuously (constant infusion), your health care provider will place a clear bandage (dressing) over the needle site. The dressing and needle will need to be changed every week, or as told by your health care provider. What is flushing? Flushing helps keep the port from getting clogged. Follow instructions from your health care provider about how and when to flush the port. Ports are usually flushed with saline solution or a medicine called heparin. The need for flushing will depend on how the port is used:  If the port is only used from time to time to give medicines or draw blood, the port may need to be flushed: ? Before and after medicines have been given. ? Before and after blood has been drawn. ? As part of routine maintenance. Flushing may be recommended every 4-6 weeks.  If a constant infusion is running, the port may not need to be flushed.  Throw away any syringes in a disposal container that is meant for sharp items (sharps container). You can buy a sharps container from a pharmacy, or you can make one by using an empty hard plastic bottle with a cover. How long will my port stay implanted? The port can stay in for as long as your health care provider thinks it is needed. When it is time for the port to come out, a surgery will be done to remove it. The surgery will be similar to the procedure that was done to put the port in. Follow these instructions at home:   Flush your port as told by your health care provider.  If you need an infusion over several days, follow instructions from your health care provider about how to take care of your port site. Make sure you: ? Wash your hands with soap and water before you change your dressing. If soap and water are not available, use alcohol-based hand sanitizer. ? Change your dressing as  told by your health care provider. ? Place any used dressings or infusion bags into a plastic bag. Throw that bag in the trash. ? Keep the dressing that covers the needle clean and dry. Do not get it wet. ? Do not use scissors or sharp objects near the tube. ? Keep the tube clamped, unless it is being used.  Check your port site every day for signs of infection. Check for: ? Redness, swelling, or pain. ? Fluid or blood. ? Pus or a bad smell.  Protect the skin around the port site. ? Avoid wearing bra straps that rub or irritate the site. ? Protect the skin around your port from seat belts. Place a soft pad over your chest if needed.  Bathe or shower as told by your health care provider. The site may get wet as long as you are not actively receiving an infusion.  Return to your  normal activities as told by your health care provider. Ask your health care provider what activities are safe for you.  Carry a medical alert card or wear a medical alert bracelet at all times. This will let health care providers know that you have an implanted port in case of an emergency. Get help right away if:  You have redness, swelling, or pain at the port site.  You have fluid or blood coming from your port site.  You have pus or a bad smell coming from the port site.  You have a fever. Summary  Implanted ports are usually placed in the chest for long-term IV access.  Follow instructions from your health care provider about flushing the port and changing bandages (dressings).  Take care of the area around your port by avoiding clothing that puts pressure on the area, and by watching for signs of infection.  Protect the skin around your port from seat belts. Place a soft pad over your chest if needed.  Get help right away if you have a fever or you have redness, swelling, pain, drainage, or a bad smell at the port site. This information is not intended to replace advice given to you by your health  care provider. Make sure you discuss any questions you have with your health care provider. Document Released: 04/12/2005 Document Revised: 05/15/2016 Document Reviewed: 05/15/2016 Elsevier Interactive Patient Education  2019 [employer].

## 2018-06-20 ENCOUNTER — Other Ambulatory Visit: Payer: Self-pay | Admitting: Gynecologic Oncology

## 2018-06-20 ENCOUNTER — Telehealth: Payer: Self-pay

## 2018-06-20 DIAGNOSIS — C562 Malignant neoplasm of left ovary: Secondary | ICD-10-CM

## 2018-06-20 NOTE — Telephone Encounter (Signed)
She called and left a message to call her.  Called back. She got her port placed yesterday and is asking if she should use the emla cream when she comes for treatment.Told her on her 3/2 appt the nurse will use a ice pack and to not use any emla cream for 2 weeks after port placed. She verbalized understanding.

## 2018-06-22 ENCOUNTER — Other Ambulatory Visit: Payer: Self-pay | Admitting: Hematology and Oncology

## 2018-06-22 DIAGNOSIS — C5701 Malignant neoplasm of right fallopian tube: Secondary | ICD-10-CM

## 2018-06-25 ENCOUNTER — Other Ambulatory Visit: Payer: Self-pay

## 2018-06-25 ENCOUNTER — Emergency Department (HOSPITAL_COMMUNITY)
Admission: EM | Admit: 2018-06-25 | Discharge: 2018-06-25 | Disposition: A | Discharge status: Home / Self Care | Payer: Medicare HMO | Attending: Emergency Medicine | Admitting: Emergency Medicine

## 2018-06-25 ENCOUNTER — Encounter (HOSPITAL_COMMUNITY): Payer: Self-pay | Admitting: Emergency Medicine

## 2018-06-25 DIAGNOSIS — Z9221 Personal history of antineoplastic chemotherapy: Secondary | ICD-10-CM | POA: Diagnosis not present

## 2018-06-25 DIAGNOSIS — C569 Malignant neoplasm of unspecified ovary: Secondary | ICD-10-CM | POA: Diagnosis not present

## 2018-06-25 DIAGNOSIS — N3090 Cystitis, unspecified without hematuria: Secondary | ICD-10-CM

## 2018-06-25 DIAGNOSIS — Z79899 Other long term (current) drug therapy: Secondary | ICD-10-CM | POA: Insufficient documentation

## 2018-06-25 DIAGNOSIS — R300 Dysuria: Secondary | ICD-10-CM | POA: Diagnosis present

## 2018-06-25 HISTORY — DX: Malignant (primary) neoplasm, unspecified: C80.1

## 2018-06-25 LAB — URINALYSIS, ROUTINE W REFLEX MICROSCOPIC
Bilirubin Urine: NEGATIVE
Glucose, UA: NEGATIVE mg/dL
Hgb urine dipstick: NEGATIVE
Ketones, ur: NEGATIVE mg/dL
NITRITE: NEGATIVE
Protein, ur: NEGATIVE mg/dL
Specific Gravity, Urine: 1.013 (ref 1.005–1.030)
pH: 5 (ref 5.0–8.0)

## 2018-06-25 MED ORDER — NITROFURANTOIN MONOHYD MACRO 100 MG PO CAPS: 100.0000 mg | ORAL_CAPSULE | Freq: Two times a day (BID) | ORAL | 0 refills | Status: DC

## 2018-06-25 NOTE — ED Provider Notes (Signed)
[facility] DEPT Provider Note   CSN: [id number] Arrival date & time: 06/25/18  0818    History   Chief Complaint Chief Complaint  Patient presents with  . Dysuria  . Urinary Retention    HPI Analyah Hopkins is a 78 y.o. female.     The history is provided by the patient and medical records. No language interpreter was used.  Dysuria   Misty Hopkins is a 77 y.o. female who presents to the Emergency Department complaining of dysuria. She has a history of interstitial cystitis as well as ovarian cancer, currently undergoing chemotherapy. She reports urinary frequency, dysuria, and a feeling of pressure on her bladder. She states that it feels like she is unable to completely empty her bladder. Symptoms started around 1230 this morning and she wanted to get checked out because she has chemotherapy scheduled for tomorrow. She denies any fevers, chills, back pain, nausea, vomiting. She has experienced persistent lower abdominal discomfort since her abdominal surgery in November of last year. Symptoms are mild and persistent in nature. Past Medical History:  Diagnosis Date  . Cancer ([place])   . Complication of anesthesia    Difficult to put to sleep  . Family history of lung cancer   . Family history of lymphoma   . GERD (gastroesophageal reflux disease)     Patient Active Problem List   Diagnosis Date Noted  . Genetic testing 06/15/2018  . Other constipation 06/08/2018  . 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 Family history of lymphoma   . Family history of lung cancer   . 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 Ovarian cancer ([place]) 03/21/2018  . Urinary tract infection 03/06/2018  . Omental mass 03/01/2018    Past Surgical History:  Procedure Laterality Date  . Bladder Lift  2000  . DEBULKING N/A 03/21/2018   Procedure: RADICAL TUMOR DEBULKING;  Surgeon: Everitt Amber, MD;  Location: [facility]  ORS;  Service: Gynecology;  Laterality: N/A;  . EYE SURGERY  2017   Cataracts (Bilateral)  . IR IMAGING GUIDED PORT INSERTION  06/19/2018  . LAPAROSCOPIC SALPINGO OOPHERECTOMY Bilateral 03/21/2018   Procedure: SALPINGO OOPHORECTOMY;  Surgeon: Everitt Amber, MD;  Location: [facility] ORS;  Service: Gynecology;  Laterality: Bilateral;  . LAPAROSCOPY N/A 03/06/2018   Procedure: LAPAROSCOPY DIAGNOSTIC OMENTAL BIOPSY;  Surgeon: Everitt Amber, MD;  Location: [facility];  Service: Gynecology;  Laterality: N/A;  . LAPAROTOMY N/A 03/21/2018   Procedure: EXPLORATORY LAPAROTOMY, INCISIONAL HERNIA REPAIR;  Surgeon: Everitt Amber, MD;  Location: [facility] ORS;  Service: Gynecology;  Laterality: N/A;  . OMENTECTOMY N/A 03/21/2018   Procedure: OMENTECTOMY;  Surgeon: Everitt Amber, MD;  Location: [facility] ORS;  Service: Gynecology;  Laterality: N/A;  . PARTIAL HYSTERECTOMY     40 years ago  . Rectum Lift  2001  . TONSILLECTOMY       OB History   No obstetric history on file.      Home Medications    Prior to Admission medications   Medication Sig Start Date End Date Taking? Authorizing Provider  carboxymethylcellulose (REFRESH PLUS) 0.5 % SOLN Place 1 drop into both eyes 3 (three) times daily as needed (for dry eyes.).    [provider]  Cholecalciferol (VITAMIN D3 SUPER STRENGTH) 50 MCG (2000 UT) TABS Take 2,000 Units by mouth daily.    [provider]  [employer] SENNA PLUS 8.6-50 MG tablet TAKE 2 TABLETS BY MOUTH AT BEDTIME *HOLD IF  HAVING LOOSE STOOLS* 06/20/18   Cross, Lenna Sciara D, NP  dexamethasone (DECADRON) 4 MG tablet Take 2 tabs at the night before and 2 tab the morning of chemotherapy, every 3 weeks, by mouth 05/24/18   [name], MD  DULoxetine (CYMBALTA) 20 MG capsule Take 1 capsule (20 mg total) by mouth daily. 06/07/18   [name], MD  lidocaine-prilocaine (EMLA) cream Apply to affected area once 05/24/18   [name], MD  nitrofurantoin, macrocrystal-monohydrate, (MACROBID) 100 MG  capsule Take 1 capsule (100 mg total) by mouth 2 (two) times daily. 06/25/18   Quintella Reichert, MD  ondansetron (ZOFRAN) 8 MG tablet Take 1 tablet (8 mg total) by mouth every 8 (eight) hours as needed for refractory nausea / vomiting. Start on day 3 after chemo. 05/24/18   [name], MD  oxyCODONE 10 MG TABS Take 1 tablet (10 mg total) by mouth every 4 (four) hours as needed for severe pain. 06/07/18   [name], MD  polyethylene glycol (MIRALAX) packet Take 17 g by mouth daily. 06/07/18   [name], MD  prochlorperazine (COMPAZINE) 10 MG tablet Take 1 tablet (10 mg total) by mouth every 6 (six) hours as needed (Nausea or vomiting). 05/24/18   [name], MD  traZODone (DESYREL) 50 MG tablet Take 50 mg by mouth at bedtime as needed for sleep.  02/23/18   [provider]  triamcinolone cream (KENALOG) 0.5 % Apply 1 application topically 2 (two) times daily. Apply to eyelids/eyebrows 02/16/18   [provider]    Family History Family History  Problem Relation Age of Onset  . Diabetes Mother   . Stroke Mother   . Non-Hodgkin's lymphoma Brother 77       exp to agent orange  . Heart disease Maternal Grandmother   . Lung cancer Maternal Grandfather   . Tuberculosis Paternal Grandmother     Social History Social History   Tobacco Use  . Smoking status: Never Smoker  . Smokeless tobacco: Never Used  Substance Use Topics  . Alcohol use: Never    Frequency: Never  . Drug use: Never     Allergies   Ivp dye [iodinated diagnostic agents]; Propoxyphene; Codeine; and Ultram [tramadol hcl]   Review of Systems Review of Systems  Genitourinary: Positive for dysuria.  All other systems reviewed and are negative.    Physical Exam Updated Vital Signs BP (!) 162/99 (BP Location: Left Arm)   Pulse 75   Temp 98.4 F (36.9 C) (Oral)   Resp 18   Physical Exam Vitals signs and nursing note reviewed.  Constitutional:      Appearance: She is well-developed.  HENT:      Head: Normocephalic and atraumatic.  Cardiovascular:     Rate and Rhythm: Normal rate and regular rhythm.     Heart sounds: No murmur.  Pulmonary:     Effort: Pulmonary effort is normal. No respiratory distress.     Breath sounds: Normal breath sounds.     Comments: Mild suprapubic tenderness Abdominal:     Palpations: Abdomen is soft.     Tenderness: There is no guarding or rebound.  Musculoskeletal:        General: No tenderness.  Skin:    General: Skin is warm and dry.     Capillary Refill: Capillary refill takes less than 2 seconds.  Neurological:     Mental Status: She is alert and oriented to person, place, and time.  Psychiatric:  Mood and Affect: Mood normal.        Behavior: Behavior normal.      ED Treatments / Results  Labs (all labs ordered are listed, but only abnormal results are displayed) Labs Reviewed  URINALYSIS, ROUTINE W REFLEX MICROSCOPIC - Abnormal; Notable for the following components:      Result Value   Leukocytes,Ua TRACE (*)    Bacteria, UA RARE (*)    All other components within normal limits  URINE CULTURE    EKG None  Radiology No results found.  Procedures Procedures (including critical care time)  Medications Ordered in ED Medications - No data to display   Initial Impression / Assessment and Plan / ED Course  I have reviewed the triage vital signs and the nursing notes.  Pertinent labs & imaging results that were available during my care of the patient were reviewed by me and considered in my medical decision making (see chart for details).       Pt with history of interstitial cystitis, ovarian cancer here for evaluation of urinary frequency and dysuria. UA is equivocal for UTI with trace leukocytes and or bacteria. In setting of new symptoms and relative immunosuppression will treat for simple cystitis. Cultures were sent and may consider discontinuing antibiotic if negative. Counseled patient on home care, outpatient  follow-up and return precautions. Presentation is not consistent with sepsis, pyelonephritis.  Final Clinical Impressions(s) / ED Diagnoses   Final diagnoses:  Cystitis    ED Discharge Orders         Ordered    nitrofurantoin, macrocrystal-monohydrate, (MACROBID) 100 MG capsule  2 times daily     06/25/18 0909           Quintella Reichert, MD 06/25/18 [phone]

## 2018-06-25 NOTE — ED Triage Notes (Signed)
Pt reports having some dysuria and pressure and little urinary retention with urination that started around 1230am this morning. Hx interstitial cystitis but hasnt had treatment in 2 years. Pt also currently getting chemo for ovarian cancer.

## 2018-06-26 ENCOUNTER — Telehealth: Payer: Self-pay | Admitting: Hematology and Oncology

## 2018-06-26 ENCOUNTER — Inpatient Hospital Stay: Payer: Medicare HMO

## 2018-06-26 ENCOUNTER — Inpatient Hospital Stay: Payer: Medicare HMO | Admitting: Hematology and Oncology

## 2018-06-26 ENCOUNTER — Inpatient Hospital Stay: Payer: Medicare HMO | Attending: Gynecologic Oncology

## 2018-06-26 DIAGNOSIS — C5701 Malignant neoplasm of right fallopian tube: Secondary | ICD-10-CM

## 2018-06-26 DIAGNOSIS — R110 Nausea: Secondary | ICD-10-CM | POA: Insufficient documentation

## 2018-06-26 DIAGNOSIS — Z79899 Other long term (current) drug therapy: Secondary | ICD-10-CM

## 2018-06-26 DIAGNOSIS — G620 Drug-induced polyneuropathy: Secondary | ICD-10-CM | POA: Diagnosis not present

## 2018-06-26 DIAGNOSIS — Z5111 Encounter for antineoplastic chemotherapy: Secondary | ICD-10-CM | POA: Insufficient documentation

## 2018-06-26 DIAGNOSIS — K5909 Other constipation: Secondary | ICD-10-CM | POA: Insufficient documentation

## 2018-06-26 DIAGNOSIS — C561 Malignant neoplasm of right ovary: Secondary | ICD-10-CM | POA: Diagnosis not present

## 2018-06-26 DIAGNOSIS — C569 Malignant neoplasm of unspecified ovary: Secondary | ICD-10-CM

## 2018-06-26 DIAGNOSIS — N3000 Acute cystitis without hematuria: Secondary | ICD-10-CM

## 2018-06-26 DIAGNOSIS — T451X5A Adverse effect of antineoplastic and immunosuppressive drugs, initial encounter: Secondary | ICD-10-CM

## 2018-06-26 LAB — CBC WITH DIFFERENTIAL (CANCER CENTER ONLY)
Abs Immature Granulocytes: 0.13 10*3/uL — ABNORMAL HIGH (ref 0.00–0.07)
Basophils Absolute: 0 10*3/uL (ref 0.0–0.1)
Basophils Relative: 0 %
Eosinophils Absolute: 0 10*3/uL (ref 0.0–0.5)
Eosinophils Relative: 0 %
HCT: 39.4 % (ref 36.0–46.0)
Hemoglobin: 12.6 g/dL (ref 12.0–15.0)
Immature Granulocytes: 1 %
Lymphocytes Relative: 8 %
Lymphs Abs: 1.2 10*3/uL (ref 0.7–4.0)
MCH: 27.5 pg (ref 26.0–34.0)
MCHC: 32 g/dL (ref 30.0–36.0)
MCV: 86 fL (ref 80.0–100.0)
Monocytes Absolute: 0.2 10*3/uL (ref 0.1–1.0)
Monocytes Relative: 1 %
NEUTROS ABS: 14.8 10*3/uL — AB (ref 1.7–7.7)
NRBC: 0 % (ref 0.0–0.2)
Neutrophils Relative %: 90 %
PLATELETS: 199 10*3/uL (ref 150–400)
RBC: 4.58 MIL/uL (ref 3.87–5.11)
RDW: 14.3 % (ref 11.5–15.5)
WBC Count: 16.4 10*3/uL — ABNORMAL HIGH (ref 4.0–10.5)

## 2018-06-26 LAB — URINE CULTURE: Culture: <10000 — AB

## 2018-06-26 LAB — CMP (CANCER CENTER ONLY)
ALT: 14 U/L (ref 0–44)
AST: 15 U/L (ref 15–41)
Albumin: 3.9 g/dL (ref 3.5–5.0)
Alkaline Phosphatase: 60 U/L (ref 38–126)
Anion gap: 11 (ref 5–15)
BUN: 19 mg/dL (ref 8–23)
CO2: 22 mmol/L (ref 22–32)
Calcium: 9.2 mg/dL (ref 8.9–10.3)
Chloride: 107 mmol/L (ref 98–111)
Creatinine: 0.69 mg/dL (ref 0.44–1.00)
GFR, Est AFR Am: >60 mL/min (ref >60)
GFR, Estimated: >60 mL/min (ref >60)
Glucose, Bld: 137 mg/dL — ABNORMAL HIGH (ref 70–99)
POTASSIUM: 4 mmol/L (ref 3.5–5.1)
Sodium: 140 mmol/L (ref 135–145)
Total Bilirubin: 0.3 mg/dL (ref 0.3–1.2)
Total Protein: 7.5 g/dL (ref 6.5–8.1)

## 2018-06-26 MED ORDER — FAMOTIDINE IN NACL 20-0.9 MG/50ML-% IV SOLN: 20.0000 mg | Freq: Once | INTRAVENOUS | Status: DC

## 2018-06-26 MED ORDER — SODIUM CHLORIDE 0.9 % IV SOLN
20.0000 mg | Freq: Once | INTRAVENOUS | Status: AC
  Administered 2018-06-26: 20 mg via INTRAVENOUS
  Filled 2018-06-26: qty 2

## 2018-06-26 MED ORDER — PALONOSETRON HCL INJECTION 0.25 MG/5ML
INTRAVENOUS | Status: AC
  Filled 2018-06-26: qty 5

## 2018-06-26 MED ORDER — SODIUM CHLORIDE 0.9 % IV SOLN
445.2000 mg | Freq: Once | INTRAVENOUS | Status: AC
  Administered 2018-06-26: 450 mg via INTRAVENOUS
  Filled 2018-06-26: qty 45

## 2018-06-26 MED ORDER — PALONOSETRON HCL INJECTION 0.25 MG/5ML
0.2500 mg | Freq: Once | INTRAVENOUS | Status: AC
  Administered 2018-06-26: 0.25 mg via INTRAVENOUS

## 2018-06-26 MED ORDER — HEPARIN SOD (PORK) LOCK FLUSH 100 UNIT/ML IV SOLN
500.0000 [IU] | Freq: Once | INTRAVENOUS | Status: AC | PRN
  Administered 2018-06-26: 500 [IU]
  Filled 2018-06-26: qty 5

## 2018-06-26 MED ORDER — DIPHENHYDRAMINE HCL 50 MG/ML IJ SOLN
INTRAMUSCULAR | Status: AC
  Filled 2018-06-26: qty 1

## 2018-06-26 MED ORDER — SODIUM CHLORIDE 0.9% FLUSH
10.0000 mL | Freq: Once | INTRAVENOUS | Status: AC
  Administered 2018-06-26: 10 mL
  Filled 2018-06-26: qty 10

## 2018-06-26 MED ORDER — SODIUM CHLORIDE 0.9% FLUSH
10.0000 mL | INTRAVENOUS | Status: DC | PRN
  Administered 2018-06-26: 10 mL
  Filled 2018-06-26: qty 10

## 2018-06-26 MED ORDER — SODIUM CHLORIDE 0.9 % IV SOLN
87.5000 mg/m2 | Freq: Once | INTRAVENOUS | Status: AC
  Administered 2018-06-26: 150 mg via INTRAVENOUS
  Filled 2018-06-26: qty 25

## 2018-06-26 MED ORDER — SODIUM CHLORIDE 0.9 % IV SOLN
Freq: Once | INTRAVENOUS | Status: AC
  Administered 2018-06-26: 11:00:00 via INTRAVENOUS
  Filled 2018-06-26: qty 250

## 2018-06-26 MED ORDER — SODIUM CHLORIDE 0.9 % IV SOLN
Freq: Once | INTRAVENOUS | Status: AC
  Administered 2018-06-26: 11:00:00 via INTRAVENOUS
  Filled 2018-06-26: qty 5

## 2018-06-26 MED ORDER — DIPHENHYDRAMINE HCL 50 MG/ML IJ SOLN
50.0000 mg | Freq: Once | INTRAMUSCULAR | Status: AC
  Administered 2018-06-26: 50 mg via INTRAVENOUS

## 2018-06-26 NOTE — Patient Instructions (Signed)
[facility] Discharge Instructions for Patients Receiving Chemotherapy  Today you received the following chemotherapy agents: Paclitaxel (Taxol) and Carboplatin (Paraplatin)  To help prevent nausea and vomiting after your treatment, we encourage you to take your nausea medication as directed. Received Aloxi during youre treatment today-->Take your Compazine prescription (not Zofran) for the next 3 days as needed. Starting Thursday 06/29/18 you can add Zofran if needed.    If you develop nausea and vomiting that is not controlled by your nausea medication, call the clinic.   BELOW ARE SYMPTOMS THAT SHOULD BE REPORTED IMMEDIATELY:  *FEVER GREATER THAN 100.5 F  *CHILLS WITH OR WITHOUT FEVER  NAUSEA AND VOMITING THAT IS NOT CONTROLLED WITH YOUR NAUSEA MEDICATION  *UNUSUAL SHORTNESS OF BREATH  *UNUSUAL BRUISING OR BLEEDING  TENDERNESS IN MOUTH AND THROAT WITH OR WITHOUT PRESENCE OF ULCERS  *URINARY PROBLEMS  *BOWEL PROBLEMS  UNUSUAL RASH Items with * indicate a potential emergency and should be followed up as soon as possible.  Feel free to call the clinic should you have any questions or concerns. The clinic phone number is [phone].  Please show the CHEMO ALERT CARD at check-in to the Emergency Department and triage nurse.   

## 2018-06-26 NOTE — Telephone Encounter (Signed)
Gave avs and calendar ° °

## 2018-06-27 ENCOUNTER — Telehealth: Payer: Self-pay

## 2018-06-27 ENCOUNTER — Encounter: Payer: Self-pay | Admitting: Hematology and Oncology

## 2018-06-27 LAB — CA 125: Cancer Antigen (CA) 125: 10.7 U/mL (ref 0.0–38.1)

## 2018-06-27 NOTE — Assessment & Plan Note (Signed)
Urine culture is negative She is instructed to stop antibiotic therapy

## 2018-06-27 NOTE — Assessment & Plan Note (Addendum)
She tolerated treatment well except for mild symptoms of cystitis, neuropathy and blood count changes She tolerated recent port placement well Her incision wound is healing well We will continue treatment as scheduled with dose adjustment for neuropathy

## 2018-06-27 NOTE — Telephone Encounter (Signed)
-----   Message from [name], MD sent at 06/27/2018 10:33 AM EST ----- Regarding: urine culture and CA-125 Pls let her know urine culture is neg, ok to stop antibiotics Also CA-125 is better

## 2018-06-27 NOTE — Assessment & Plan Note (Signed)
she has mild peripheral neuropathy, likely related to side effects of treatment. °I plan to reduce the dose of treatment as outlined above.  °I explained to the patient the rationale of this strategy and reassured the patient it would not compromise the efficacy of treatment ° °

## 2018-06-27 NOTE — Progress Notes (Signed)
[facility] OFFICE PROGRESS NOTE  Patient Care Team: Allie Dimmer, MD as PCP - General (Internal Medicine)  ASSESSMENT & PLAN:  Fallopian tube cancer, carcinoma, right ([place]) She tolerated treatment well except for mild symptoms of cystitis, neuropathy and blood count changes She tolerated recent port placement well Her incision wound is healing well We will continue treatment as scheduled with dose adjustment for neuropathy  Peripheral neuropathy due to chemotherapy [facility]) she has mild peripheral neuropathy, likely related to side effects of treatment. I plan to reduce the dose of treatment as outlined above.  I explained to the patient the rationale of this strategy and reassured the patient it would not compromise the efficacy of treatment   Urinary tract infection Urine culture is negative She is instructed to stop antibiotic therapy   No orders of the defined types were placed in this encounter.   INTERVAL HISTORY: Please see below for problem oriented charting. She returns for further follow-up Her neuropathy is improving in time She went to the emergency room for symptoms of cystitis and was prescribed antibiotic treatment She denies nausea Her incision wound is healing well No recent fever or chills  SUMMARY OF ONCOLOGIC HISTORY: Oncology History   High grade serous, right fallopian tube  HRD, BRCA tumor testing were neg     Ovarian cancer ([place])   03/21/2018 Initial Diagnosis    Ovarian cancer ([place])     Fallopian tube cancer, carcinoma, right ([place])   10/24/2017 Initial Diagnosis    She has presentation of vague abdominal pain    02/20/2018 Imaging    Outside CT abdomen and pelvis: This revealed an incidental finding of a 1.5 cm subcapsular lesion in the lateral upper pole of the left kidney which measured higher than fluid attenuation which could represent a proteinaceous renal cyst or solid renal mass.  There was no ascites.  There is no  lymphadenopathy.  There were no masses in the pelvis including ovarian or adnexal masses seen.  There was however soft tissue stranding seen with nodularity in the omental fat in the lower abdomen without evidence of ascites.  This was felt to represent either carcinomatosis or inflammatory infectious etiologies.  There was colonic diverticulosis seen without radiographic evidence of diverticulitis.  The radiologist recommended MRI of the abdomen to further work-up the renal mass    02/22/2018 Tumor Marker    Patient's tumor was tested for the following markers: CA-125. Results of the tumor marker test revealed 103.    03/06/2018 Pathology Results    Omentum, biopsy - ADENOCARCINOMA WITH PSAMMOMA BODIES, SEE COMMENT. Microscopic Comment There are scattered small foci of malignant glands with associated psammoma bodies. While the tumor is somewhat limited the cells have a more low grade appearance. There is prominent lymphovascular invasion. Immunohistochemistry reveals the cells are positive for cytokeratin 7, ER, MOC31, PAX8, and WT-1. They are negative for calretinin, cytokeratin 20, CDX-2, and TTF-1. Overall, the findings are consistent with adenocarcinoma. The differential includes a primary gynecologic or peritoneal tumor, although the morphology is not typical of a high grade serous carcinoma.    03/06/2018 Surgery    Surgeon: Donaciano Eva   Pre-operative Diagnosis: omental mass, elevated CA 125 Operation: Laparoscopic omentectomy  Surgeon: Donaciano Eva  Operative Findings:  : normal diaphragm and upper omentum. Sigmoid colon densely adherent to left pelvic side wall consistent with history of diverticulitis. Omentum adherent to anterior abdominal wall and sigmoid colon. Surgically absent uterus. Right ovary very small and  grossly normal. Left ovary obscured by adhesed sigmoid colon. No ascites. No carcinomatosis. There was a nodular firm distal omentum on the left  which was adherent to the sigmoid colon and most consistent with inflammatory change.       03/21/2018 Pathology Results    1. Soft tissue mass, simple excision, midline abdominal wall mass - METASTATIC SEROUS CARCINOMA. 2. Omentum, resection for tumor - METASTATIC SEROUS CARCINOMA. 3. Adnexa - ovary +/- tube, neoplastic, right - RIGHT FALLOPIAN TUBE: -HIGH GRADE SEROUS CARCINOMA. -SEE ONCOLOGY TABLE. - RIGHT OVARY: SURFACE PSAMMOMA BODIES. NO DEFINITIVE MALIGNANT CELLS. -INCLUSION CYSTS. 4. Adnexa - ovary +/- tube, neoplastic, left - LEFT FALLOPIAN TUBE: LUMINAL AND SURFACE SEROUS CARCINOMA. - LEFT OVARY: FOCAL PSAMMOMA BODIES AND MALIGNANT CELLS. 5. Soft tissue mass, simple excision, left abdominal wall - METASTATIC SEROUS CARCINOMA. Microscopic Comment 3. OVARY or FALLOPIAN TUBE or PRIMARY PERITONEUM: Procedure: Bilateral salpingo-oophorectomy. Abdominal wall mass excision and omentectomy. Specimen Integrity: Intact. Tumor Site: Right fallopian tube. Ovarian Surface Involvement (required only if applicable): Present (left). Fallopian Tube Surface Involvement (required only if applicable): Present. Tumor Size: 1.5 cm. Histologic Type: High grade serous carcinoma. Histologic Grade: High grade. Implants (required for advanced stage serous/seromucinous borderline tumors only): Present. Other Tissue/ Organ Involvement: Omentum, abdominal wall, left fallopian tube and ovary. Largest Extrapelvic Peritoneal Focus (required only if applicable): >2 cm. Peritoneal/Ascitic Fluid: N/A. Treatment Effect (required only for high-grade serous carcinomas): N/A. Regional Lymph Nodes: No lymph nodes submitted or found Pathologic Stage Classification (pTNM, AJCC 8th Edition): pT3c, pNX Representative Tumor Block: 3A Comment(s): None.    03/21/2018 Surgery    Preoperative Diagnosis: stage IIIC primary peritoneal vs ovarian cancer   Procedure(s) Performed:  Exploratory laparotomy with  bilateral salpingo-oophorectomy, omentectomy radical tumor debulking for ovarian cancer, ventral hernia repair.   Surgeon: Thereasa Solo, MD. Specimens: Bilateral tubes / ovaries, omentum. Midline abdominal wall mass, left lateral abdominal wall mass.    Operative Findings:  Abdominal wall masses consistent with port site metastases at the umbilical incision (7cm) and left lateral incision (5cm). Omental caking in the infracolic omentum. Grossly normal very small tubes and ovaries. Normal diaphragms. No lymphadenopathy. Normal small and large intestine with the exception of miliary studding of tumor on the surface of the sigmoid colon and rectum in the cul de sac.    This represented an optimal cytoreduction (R1) with no gross visible disease remaining, however there was a thin rind of tumor on the lateral left fascia associated with the port site metastasis.      04/11/2018 Cancer Staging    Staging form: Ovary, Fallopian Tube, and Primary Peritoneal Carcinoma, AJCC 8th Edition - Pathologic: FIGO Stage IIIC (pT3, pN0, cM0) - Signed by [name], MD on 04/11/2018    05/23/2018 Imaging    1. Interval resolution of the anterior midline abdominal wall seroma. There is some persistent wispy soft tissue attenuation in the region of the midline incision, nonspecific and may simply reflect granulation tissue from surgery. 2. Stable 13 mm exophytic lesion posterior left kidney with attenuation higher than expected for a simple cyst. This may be a cyst complicated by proteinaceous debris or hemorrhage, but attention on follow-up recommended. 3. Stable mild persistent distention of proximal jejunal loops with gradual tapering to nondilated distal small bowel. 4. No overt peritoneal or mesenteric disease identified on this exam.    05/23/2018 Tumor Marker    Patient's tumor was tested for the following markers: CA-125 Results of the tumor marker test revealed 11.6  06/02/2018 -  Chemotherapy    The  patient had carboplatin and taxol    06/19/2018 Procedure    Status post right IJ port catheter placement. Catheter ready for use.    06/26/2018 Tumor Marker    Patient's tumor was tested for the following markers: CA-125. Results of the tumor marker test revealed 10.7     REVIEW OF SYSTEMS:   Constitutional: Denies fevers, chills or abnormal weight loss Eyes: Denies blurriness of vision Ears, nose, mouth, throat, and face: Denies mucositis or sore throat Respiratory: Denies cough, dyspnea or wheezes Cardiovascular: Denies palpitation, chest discomfort or lower extremity swelling Gastrointestinal:  Denies nausea, heartburn or change in bowel habits Skin: Denies abnormal skin rashes Lymphatics: Denies new lymphadenopathy or easy bruising Behavioral/Psych: Mood is stable, no new changes  All other systems were reviewed with the patient and are negative.  I have reviewed the past medical history, past surgical history, social history and family history with the patient and they are unchanged from previous note.  ALLERGIES:  is allergic to ivp dye [iodinated diagnostic agents]; propoxyphene; codeine; and ultram [tramadol hcl].  MEDICATIONS:  Current Outpatient Medications  Medication Sig Dispense Refill  . carboxymethylcellulose (REFRESH PLUS) 0.5 % SOLN Place 1 drop into both eyes 3 (three) times daily as needed (for dry eyes.).    Marland Kitchen Cholecalciferol (VITAMIN D3 SUPER STRENGTH) 50 MCG (2000 UT) TABS Take 2,000 Units by mouth daily.    . [employer] SENNA PLUS 8.6-50 MG tablet TAKE 2 TABLETS BY MOUTH AT BEDTIME *HOLD IF HAVING LOOSE STOOLS* 60 tablet 1  . dexamethasone (DECADRON) 4 MG tablet Take 2 tabs at the night before and 2 tab the morning of chemotherapy, every 3 weeks, by mouth 24 tablet 0  . DULoxetine (CYMBALTA) 20 MG capsule Take 1 capsule (20 mg total) by mouth daily. 30 capsule 3  . lidocaine-prilocaine (EMLA) cream Apply to affected area once 30 g 3  . nitrofurantoin,  macrocrystal-monohydrate, (MACROBID) 100 MG capsule Take 1 capsule (100 mg total) by mouth 2 (two) times daily. 10 capsule 0  . ondansetron (ZOFRAN) 8 MG tablet Take 1 tablet (8 mg total) by mouth every 8 (eight) hours as needed for refractory nausea / vomiting. Start on day 3 after chemo. 30 tablet 1  . oxyCODONE 10 MG TABS Take 1 tablet (10 mg total) by mouth every 4 (four) hours as needed for severe pain. 60 tablet 0  . polyethylene glycol (MIRALAX) packet Take 17 g by mouth daily. 14 each 1  . prochlorperazine (COMPAZINE) 10 MG tablet Take 1 tablet (10 mg total) by mouth every 6 (six) hours as needed (Nausea or vomiting). 30 tablet 1  . traZODone (DESYREL) 50 MG tablet Take 50 mg by mouth at bedtime as needed for sleep.   0  . triamcinolone cream (KENALOG) 0.5 % Apply 1 application topically 2 (two) times daily. Apply to eyelids/eyebrows  4   No current facility-administered medications for this visit.     PHYSICAL EXAMINATION: ECOG PERFORMANCE STATUS: 1 - Symptomatic but completely ambulatory  Vitals:   06/26/18 0923  BP: (!) 168/85  Pulse: 83  Resp: 18  Temp: 98.2 F (36.8 C)  SpO2: 97%   Filed Weights   06/26/18 0923  Weight: 144 lb 12.8 oz (65.7 kg)    GENERAL:alert, no distress and comfortable SKIN: skin color, texture, turgor are normal, no rashes or significant lesions EYES: normal, Conjunctiva are pink and non-injected, sclera clear OROPHARYNX:no exudate, no erythema and lips, buccal  mucosa, and tongue normal  NECK: supple, thyroid normal size, non-tender, without nodularity LYMPH:  no palpable lymphadenopathy in the cervical, axillary or inguinal LUNGS: clear to auscultation and percussion with normal breathing effort HEART: regular rate & rhythm and no murmurs and no lower extremity edema ABDOMEN:abdomen soft, non-tender and normal bowel sounds Musculoskeletal:no cyanosis of digits and no clubbing  NEURO: alert & oriented x 3 with fluent speech, no focal  motor/sensory deficits  LABORATORY DATA:  I have reviewed the data as listed    Component Value Date/Time   NA 140 06/26/2018 0754   K 4.0 06/26/2018 0754   CL 107 06/26/2018 0754   CO2 22 06/26/2018 0754   GLUCOSE 137 (H) 06/26/2018 0754   BUN 19 06/26/2018 0754   CREATININE 0.69 06/26/2018 0754   CALCIUM 9.2 06/26/2018 0754   PROT 7.5 06/26/2018 0754   ALBUMIN 3.9 06/26/2018 0754   AST 15 06/26/2018 0754   ALT 14 06/26/2018 0754   ALKPHOS 60 06/26/2018 0754   BILITOT 0.3 06/26/2018 0754   GFRNONAA >60 06/26/2018 0754   GFRAA >60 06/26/2018 0754    No results found for: SPEP, UPEP  Lab Results  Component Value Date   WBC 16.4 (H) 06/26/2018   NEUTROABS 14.8 (H) 06/26/2018   HGB 12.6 06/26/2018   HCT 39.4 06/26/2018   MCV 86.0 06/26/2018   PLT 199 06/26/2018      Chemistry      Component Value Date/Time   NA 140 06/26/2018 0754   K 4.0 06/26/2018 0754   CL 107 06/26/2018 0754   CO2 22 06/26/2018 0754   BUN 19 06/26/2018 0754   CREATININE 0.69 06/26/2018 0754      Component Value Date/Time   CALCIUM 9.2 06/26/2018 0754   ALKPHOS 60 06/26/2018 0754   AST 15 06/26/2018 0754   ALT 14 06/26/2018 0754   BILITOT 0.3 06/26/2018 0754       RADIOGRAPHIC STUDIES: I have personally reviewed the radiological images as listed and agreed with the findings in the report. Ir Imaging Guided Port Insertion  Result Date: 06/19/2018 INDICATION: 79 year old female with a history of fallopian tube carcinoma EXAM: IMPLANTED PORT A CATH PLACEMENT WITH ULTRASOUND AND FLUOROSCOPIC GUIDANCE MEDICATIONS: 2.0 g Ancef; The antibiotic was administered within an appropriate time interval prior to skin puncture. ANESTHESIA/SEDATION: Moderate (conscious) sedation was employed during this procedure. A total of Versed 2.0 mg and Fentanyl 100 mcg was administered intravenously. Moderate Sedation Time: 16 minutes. The patient's level of consciousness and vital signs were monitored continuously  by radiology nursing throughout the procedure under my direct supervision. FLUOROSCOPY TIME:  0 minutes, 12 seconds (1.2 mGy) COMPLICATIONS: None PROCEDURE: The procedure, risks, benefits, and alternatives were explained to the patient. Questions regarding the procedure were encouraged and answered. The patient understands and consents to the procedure. Ultrasound survey was performed with images stored and sent to PACs. The right neck and chest was prepped with chlorhexidine, and draped in the usual sterile fashion using maximum barrier technique (cap and mask, sterile gown, sterile gloves, large sterile sheet, hand hygiene and cutaneous antiseptic). Antibiotic prophylaxis was provided with 2.0g Ancef administered IV one hour prior to skin incision. Local anesthesia was attained by infiltration with 1% lidocaine without epinephrine. Ultrasound demonstrated patency of the right internal jugular vein, and this was documented with an image. Under real-time ultrasound guidance, this vein was accessed with a 21 gauge micropuncture needle and image documentation was performed. A small dermatotomy was made at the  access site with an 11 scalpel. A 0.018" wire was advanced into the SVC and used to estimate the length of the internal catheter. The access needle exchanged for a 26F micropuncture vascular sheath. The 0.018" wire was then removed and a 0.035" wire advanced into the IVC. An appropriate location for the subcutaneous reservoir was selected below the clavicle and an incision was made through the skin and underlying soft tissues. The subcutaneous tissues were then dissected using a combination of blunt and sharp surgical technique and a pocket was formed. A single lumen power injectable portacatheter was then tunneled through the subcutaneous tissues from the pocket to the dermatotomy and the port reservoir placed within the subcutaneous pocket. The venous access site was then serially dilated and a peel away  vascular sheath placed over the wire. The wire was removed and the port catheter advanced into position under fluoroscopic guidance. The catheter tip is positioned in the cavoatrial junction. This was documented with a spot image. The portacatheter was then tested and found to flush and aspirate well. The port was flushed with saline followed by 100 units/mL heparinized saline. The pocket was then closed in two layers using first subdermal inverted interrupted absorbable sutures followed by a running subcuticular suture. The epidermis was then sealed with Dermabond. The dermatotomy at the venous access site was also seal with Dermabond. Patient tolerated the procedure well and remained hemodynamically stable throughout. No complications encountered and no significant blood loss encountered IMPRESSION: Status post right IJ port catheter placement. Catheter ready for use. Signed, Dulcy Fanny. Dellia Nims, RPVI [facility] Radiology Electronically Signed   By: Corrie Mckusick D.O.   On: 06/19/2018 14:07    All questions were answered. The patient knows to call the clinic with any problems, questions or concerns. No barriers to learning was detected.  I spent 25 minutes counseling the patient face to face. The total time spent in the appointment was 30 minutes and more than 50% was on counseling and review of test results  [name], MD 06/27/2018 2:35 PM

## 2018-06-27 NOTE — Telephone Encounter (Signed)
Called and given below message. She verbalized understanding. 

## 2018-06-30 ENCOUNTER — Telehealth: Payer: Self-pay

## 2018-06-30 NOTE — Telephone Encounter (Signed)
Pt called to inform us that she was in ED of [facility] last night with CP.  They did an EKG and blood work and sent her home.  She called their med rec dept to request those records be sent to [place] and was told that we would have to request the records.  I have faxed a cover sheet requesting the records.  Pt denies any issues or other concerns at this time.

## 2018-07-17 ENCOUNTER — Inpatient Hospital Stay: Payer: Medicare HMO

## 2018-07-17 ENCOUNTER — Other Ambulatory Visit: Payer: Self-pay

## 2018-07-17 ENCOUNTER — Inpatient Hospital Stay (HOSPITAL_BASED_OUTPATIENT_CLINIC_OR_DEPARTMENT_OTHER): Payer: Medicare HMO | Admitting: Hematology and Oncology

## 2018-07-17 DIAGNOSIS — C5701 Malignant neoplasm of right fallopian tube: Secondary | ICD-10-CM

## 2018-07-17 DIAGNOSIS — T451X5A Adverse effect of antineoplastic and immunosuppressive drugs, initial encounter: Secondary | ICD-10-CM

## 2018-07-17 DIAGNOSIS — K5909 Other constipation: Secondary | ICD-10-CM | POA: Diagnosis not present

## 2018-07-17 DIAGNOSIS — R110 Nausea: Secondary | ICD-10-CM

## 2018-07-17 DIAGNOSIS — Z79899 Other long term (current) drug therapy: Secondary | ICD-10-CM

## 2018-07-17 DIAGNOSIS — R194 Change in bowel habit: Secondary | ICD-10-CM

## 2018-07-17 DIAGNOSIS — C569 Malignant neoplasm of unspecified ovary: Secondary | ICD-10-CM

## 2018-07-17 DIAGNOSIS — Z5111 Encounter for antineoplastic chemotherapy: Secondary | ICD-10-CM | POA: Diagnosis not present

## 2018-07-17 DIAGNOSIS — G620 Drug-induced polyneuropathy: Secondary | ICD-10-CM

## 2018-07-17 DIAGNOSIS — C561 Malignant neoplasm of right ovary: Secondary | ICD-10-CM | POA: Diagnosis not present

## 2018-07-17 LAB — CBC WITH DIFFERENTIAL (CANCER CENTER ONLY)
Abs Immature Granulocytes: 0.15 10*3/uL — ABNORMAL HIGH (ref 0.00–0.07)
BASOS ABS: 0 10*3/uL (ref 0.0–0.1)
Basophils Relative: 0 %
Eosinophils Absolute: 0 10*3/uL (ref 0.0–0.5)
Eosinophils Relative: 0 %
HCT: 38.2 % (ref 36.0–46.0)
Hemoglobin: 12.3 g/dL (ref 12.0–15.0)
Immature Granulocytes: 1 %
LYMPHS PCT: 9 %
Lymphs Abs: 1.5 10*3/uL (ref 0.7–4.0)
MCH: 27.7 pg (ref 26.0–34.0)
MCHC: 32.2 g/dL (ref 30.0–36.0)
MCV: 86 fL (ref 80.0–100.0)
Monocytes Absolute: 0.6 10*3/uL (ref 0.1–1.0)
Monocytes Relative: 3 %
NEUTROS ABS: 14.2 10*3/uL — AB (ref 1.7–7.7)
Neutrophils Relative %: 87 %
Platelet Count: 172 10*3/uL (ref 150–400)
RBC: 4.44 MIL/uL (ref 3.87–5.11)
RDW: 15.6 % — ABNORMAL HIGH (ref 11.5–15.5)
WBC Count: 16.4 10*3/uL — ABNORMAL HIGH (ref 4.0–10.5)
nRBC: 0 % (ref 0.0–0.2)

## 2018-07-17 LAB — CMP (CANCER CENTER ONLY)
ALBUMIN: 3.8 g/dL (ref 3.5–5.0)
ALT: 18 U/L (ref 0–44)
AST: 16 U/L (ref 15–41)
Alkaline Phosphatase: 53 U/L (ref 38–126)
Anion gap: 10 (ref 5–15)
BUN: 18 mg/dL (ref 8–23)
CO2: 23 mmol/L (ref 22–32)
CREATININE: 0.67 mg/dL (ref 0.44–1.00)
Calcium: 9.1 mg/dL (ref 8.9–10.3)
Chloride: 109 mmol/L (ref 98–111)
GFR, Est AFR Am: >60 mL/min (ref >60)
GFR, Estimated: >60 mL/min (ref >60)
GLUCOSE: 115 mg/dL — AB (ref 70–99)
Potassium: 4.4 mmol/L (ref 3.5–5.1)
Sodium: 142 mmol/L (ref 135–145)
Total Bilirubin: 0.3 mg/dL (ref 0.3–1.2)
Total Protein: 7.1 g/dL (ref 6.5–8.1)

## 2018-07-17 MED ORDER — SODIUM CHLORIDE 0.9% FLUSH
10.0000 mL | INTRAVENOUS | Status: DC | PRN
  Administered 2018-07-17: 10 mL
  Filled 2018-07-17: qty 10

## 2018-07-17 MED ORDER — SODIUM CHLORIDE 0.9 % IV SOLN
20.0000 mg | Freq: Once | INTRAVENOUS | Status: AC
  Administered 2018-07-17: 20 mg via INTRAVENOUS
  Filled 2018-07-17: qty 2

## 2018-07-17 MED ORDER — PALONOSETRON HCL INJECTION 0.25 MG/5ML
INTRAVENOUS | Status: AC
  Filled 2018-07-17: qty 5

## 2018-07-17 MED ORDER — SODIUM CHLORIDE 0.9 % IV SOLN
87.5000 mg/m2 | Freq: Once | INTRAVENOUS | Status: AC
  Administered 2018-07-17: 150 mg via INTRAVENOUS
  Filled 2018-07-17: qty 25

## 2018-07-17 MED ORDER — SODIUM CHLORIDE 0.9 % IV SOLN
445.2000 mg | Freq: Once | INTRAVENOUS | Status: AC
  Administered 2018-07-17: 450 mg via INTRAVENOUS
  Filled 2018-07-17: qty 45

## 2018-07-17 MED ORDER — DEXAMETHASONE 4 MG PO TABS: ORAL_TABLET | ORAL | 0 refills | Status: DC

## 2018-07-17 MED ORDER — HEPARIN SOD (PORK) LOCK FLUSH 100 UNIT/ML IV SOLN
500.0000 [IU] | Freq: Once | INTRAVENOUS | Status: AC | PRN
  Administered 2018-07-17: 500 [IU]
  Filled 2018-07-17: qty 5

## 2018-07-17 MED ORDER — DIPHENHYDRAMINE HCL 50 MG/ML IJ SOLN
50.0000 mg | Freq: Once | INTRAMUSCULAR | Status: AC
  Administered 2018-07-17: 50 mg via INTRAVENOUS

## 2018-07-17 MED ORDER — SODIUM CHLORIDE 0.9 % IV SOLN
Freq: Once | INTRAVENOUS | Status: AC
  Administered 2018-07-17: 10:00:00 via INTRAVENOUS
  Filled 2018-07-17: qty 5

## 2018-07-17 MED ORDER — FAMOTIDINE IN NACL 20-0.9 MG/50ML-% IV SOLN: 20.0000 mg | Freq: Once | INTRAVENOUS | Status: DC

## 2018-07-17 MED ORDER — SODIUM CHLORIDE 0.9% FLUSH
10.0000 mL | Freq: Once | INTRAVENOUS | Status: AC
  Administered 2018-07-17: 10 mL
  Filled 2018-07-17: qty 10

## 2018-07-17 MED ORDER — SODIUM CHLORIDE 0.9 % IV SOLN
Freq: Once | INTRAVENOUS | Status: AC
  Administered 2018-07-17: 09:00:00 via INTRAVENOUS
  Filled 2018-07-17: qty 250

## 2018-07-17 MED ORDER — PALONOSETRON HCL INJECTION 0.25 MG/5ML
0.2500 mg | Freq: Once | INTRAVENOUS | Status: AC
  Administered 2018-07-17: 0.25 mg via INTRAVENOUS

## 2018-07-17 MED ORDER — DIPHENHYDRAMINE HCL 50 MG/ML IJ SOLN
INTRAMUSCULAR | Status: AC
  Filled 2018-07-17: qty 1

## 2018-07-17 MED FILL — DEXAMETHASONE 4 MG TABLET: 4 | 30 days supply | Qty: 10 | Fill #0

## 2018-07-17 NOTE — Patient Instructions (Signed)
[facility] Discharge Instructions for Patients Receiving Chemotherapy  Today you received the following chemotherapy agents: Paclitaxel (Taxol) and Carboplatin (Paraplatin)  To help prevent nausea and vomiting after your treatment, we encourage you to take your nausea medication as directed. Received Aloxi during youre treatment today-->Take your Compazine prescription (not Zofran) for the next 3 days as needed. Starting Thursday 06/29/18 you can add Zofran if needed.    If you develop nausea and vomiting that is not controlled by your nausea medication, call the clinic.   BELOW ARE SYMPTOMS THAT SHOULD BE REPORTED IMMEDIATELY:  *FEVER GREATER THAN 100.5 F  *CHILLS WITH OR WITHOUT FEVER  NAUSEA AND VOMITING THAT IS NOT CONTROLLED WITH YOUR NAUSEA MEDICATION  *UNUSUAL SHORTNESS OF BREATH  *UNUSUAL BRUISING OR BLEEDING  TENDERNESS IN MOUTH AND THROAT WITH OR WITHOUT PRESENCE OF ULCERS  *URINARY PROBLEMS  *BOWEL PROBLEMS  UNUSUAL RASH Items with * indicate a potential emergency and should be followed up as soon as possible.  Feel free to call the clinic should you have any questions or concerns. The clinic phone number is [phone].  Please show the CHEMO ALERT CARD at check-in to the Emergency Department and triage nurse.   

## 2018-07-18 ENCOUNTER — Encounter: Payer: Self-pay | Admitting: Hematology and Oncology

## 2018-07-18 NOTE — Assessment & Plan Note (Signed)
Neuropathy is stable with recent dose adjustment.  We will continue similar dosage for future treatment 

## 2018-07-18 NOTE — Assessment & Plan Note (Signed)
She tolerated treatment well except for mild symptoms of cystitis, neuropathy and blood count changes Her incision wound is completely healed We will continue treatment as scheduled with dose adjustment for neuropathy

## 2018-07-18 NOTE — Assessment & Plan Note (Signed)
She complained of some mild constipation, well controlled with laxative. She will continue the same 

## 2018-07-18 NOTE — Progress Notes (Signed)
[facility] OFFICE PROGRESS NOTE  Patient Care Team: Allie Dimmer, MD as PCP - General (Internal Medicine)  ASSESSMENT & PLAN:  Fallopian tube cancer, carcinoma, right ([place]) She tolerated treatment well except for mild symptoms of cystitis, neuropathy and blood count changes Her incision wound is completely healed We will continue treatment as scheduled with dose adjustment for neuropathy  Peripheral neuropathy due to chemotherapy ([place]) Neuropathy is stable with recent dose adjustment.  We will continue similar dosage for future treatment  Other constipation She complained of some mild constipation, well controlled with laxative. She will continue the same   No orders of the defined types were placed in this encounter.   INTERVAL HISTORY: Please see below for problem oriented charting. She returns for chemotherapy and follow-up She went to local emergency department 3 weeks ago for atypical chest discomfort that resolved EKG and cardiac biomarkers were unremarkable With recent dose adjustment, neuropathy is stable/improved She had minimum nausea or changes in bowel habits She takes laxative to avoid constipation Denies concern for infection.  Her abdominal wound has finally healed  SUMMARY OF ONCOLOGIC HISTORY: Oncology History   High grade serous, right fallopian tube  HRD, BRCA tumor testing were neg     Ovarian cancer ([place])   03/21/2018 Initial Diagnosis    Ovarian cancer ([place])     Fallopian tube cancer, carcinoma, right ([place])   10/24/2017 Initial Diagnosis    She has presentation of vague abdominal pain    02/20/2018 Imaging    Outside CT abdomen and pelvis: This revealed an incidental finding of a 1.5 cm subcapsular lesion in the lateral upper pole of the left kidney which measured higher than fluid attenuation which could represent a proteinaceous renal cyst or solid renal mass.  There was no ascites.  There is no lymphadenopathy.  There were no  masses in the pelvis including ovarian or adnexal masses seen.  There was however soft tissue stranding seen with nodularity in the omental fat in the lower abdomen without evidence of ascites.  This was felt to represent either carcinomatosis or inflammatory infectious etiologies.  There was colonic diverticulosis seen without radiographic evidence of diverticulitis.  The radiologist recommended MRI of the abdomen to further work-up the renal mass    02/22/2018 Tumor Marker    Patient's tumor was tested for the following markers: CA-125. Results of the tumor marker test revealed 103.    03/06/2018 Pathology Results    Omentum, biopsy - ADENOCARCINOMA WITH PSAMMOMA BODIES, SEE COMMENT. Microscopic Comment There are scattered small foci of malignant glands with associated psammoma bodies. While the tumor is somewhat limited the cells have a more low grade appearance. There is prominent lymphovascular invasion. Immunohistochemistry reveals the cells are positive for cytokeratin 7, ER, MOC31, PAX8, and WT-1. They are negative for calretinin, cytokeratin 20, CDX-2, and TTF-1. Overall, the findings are consistent with adenocarcinoma. The differential includes a primary gynecologic or peritoneal tumor, although the morphology is not typical of a high grade serous carcinoma.    03/06/2018 Surgery    Surgeon: Donaciano Eva   Pre-operative Diagnosis: omental mass, elevated CA 125 Operation: Laparoscopic omentectomy  Surgeon: Donaciano Eva  Operative Findings:  : normal diaphragm and upper omentum. Sigmoid colon densely adherent to left pelvic side wall consistent with history of diverticulitis. Omentum adherent to anterior abdominal wall and sigmoid colon. Surgically absent uterus. Right ovary very small and grossly normal. Left ovary obscured by adhesed sigmoid colon. No ascites. No carcinomatosis. There  was a nodular firm distal omentum on the left which was adherent to the sigmoid  colon and most consistent with inflammatory change.       03/21/2018 Pathology Results    1. Soft tissue mass, simple excision, midline abdominal wall mass - METASTATIC SEROUS CARCINOMA. 2. Omentum, resection for tumor - METASTATIC SEROUS CARCINOMA. 3. Adnexa - ovary +/- tube, neoplastic, right - RIGHT FALLOPIAN TUBE: -HIGH GRADE SEROUS CARCINOMA. -SEE ONCOLOGY TABLE. - RIGHT OVARY: SURFACE PSAMMOMA BODIES. NO DEFINITIVE MALIGNANT CELLS. -INCLUSION CYSTS. 4. Adnexa - ovary +/- tube, neoplastic, left - LEFT FALLOPIAN TUBE: LUMINAL AND SURFACE SEROUS CARCINOMA. - LEFT OVARY: FOCAL PSAMMOMA BODIES AND MALIGNANT CELLS. 5. Soft tissue mass, simple excision, left abdominal wall - METASTATIC SEROUS CARCINOMA. Microscopic Comment 3. OVARY or FALLOPIAN TUBE or PRIMARY PERITONEUM: Procedure: Bilateral salpingo-oophorectomy. Abdominal wall mass excision and omentectomy. Specimen Integrity: Intact. Tumor Site: Right fallopian tube. Ovarian Surface Involvement (required only if applicable): Present (left). Fallopian Tube Surface Involvement (required only if applicable): Present. Tumor Size: 1.5 cm. Histologic Type: High grade serous carcinoma. Histologic Grade: High grade. Implants (required for advanced stage serous/seromucinous borderline tumors only): Present. Other Tissue/ Organ Involvement: Omentum, abdominal wall, left fallopian tube and ovary. Largest Extrapelvic Peritoneal Focus (required only if applicable): >2 cm. Peritoneal/Ascitic Fluid: N/A. Treatment Effect (required only for high-grade serous carcinomas): N/A. Regional Lymph Nodes: No lymph nodes submitted or found Pathologic Stage Classification (pTNM, AJCC 8th Edition): pT3c, pNX Representative Tumor Block: 3A Comment(s): None.    03/21/2018 Surgery    Preoperative Diagnosis: stage IIIC primary peritoneal vs ovarian cancer   Procedure(s) Performed:  Exploratory laparotomy with bilateral salpingo-oophorectomy,  omentectomy radical tumor debulking for ovarian cancer, ventral hernia repair.   Surgeon: Thereasa Solo, MD. Specimens: Bilateral tubes / ovaries, omentum. Midline abdominal wall mass, left lateral abdominal wall mass.    Operative Findings:  Abdominal wall masses consistent with port site metastases at the umbilical incision (7cm) and left lateral incision (5cm). Omental caking in the infracolic omentum. Grossly normal very small tubes and ovaries. Normal diaphragms. No lymphadenopathy. Normal small and large intestine with the exception of miliary studding of tumor on the surface of the sigmoid colon and rectum in the cul de sac.    This represented an optimal cytoreduction (R1) with no gross visible disease remaining, however there was a thin rind of tumor on the lateral left fascia associated with the port site metastasis.      04/11/2018 Cancer Staging    Staging form: Ovary, Fallopian Tube, and Primary Peritoneal Carcinoma, AJCC 8th Edition - Pathologic: FIGO Stage IIIC (pT3, pN0, cM0) - Signed by [name], MD on 04/11/2018    05/23/2018 Imaging    1. Interval resolution of the anterior midline abdominal wall seroma. There is some persistent wispy soft tissue attenuation in the region of the midline incision, nonspecific and may simply reflect granulation tissue from surgery. 2. Stable 13 mm exophytic lesion posterior left kidney with attenuation higher than expected for a simple cyst. This may be a cyst complicated by proteinaceous debris or hemorrhage, but attention on follow-up recommended. 3. Stable mild persistent distention of proximal jejunal loops with gradual tapering to nondilated distal small bowel. 4. No overt peritoneal or mesenteric disease identified on this exam.    05/23/2018 Tumor Marker    Patient's tumor was tested for the following markers: CA-125 Results of the tumor marker test revealed 11.6    06/02/2018 -  Chemotherapy    The patient had carboplatin  and  taxol    06/19/2018 Procedure    Status post right IJ port catheter placement. Catheter ready for use.    06/26/2018 Tumor Marker    Patient's tumor was tested for the following markers: CA-125. Results of the tumor marker test revealed 10.7     REVIEW OF SYSTEMS:   Constitutional: Denies fevers, chills or abnormal weight loss Eyes: Denies blurriness of vision Ears, nose, mouth, throat, and face: Denies mucositis or sore throat Respiratory: Denies cough, dyspnea or wheezes Cardiovascular: Denies palpitation, chest discomfort or lower extremity swelling Skin: Denies abnormal skin rashes Lymphatics: Denies new lymphadenopathy or easy bruising Neurological:Denies numbness, tingling or new weaknesses Behavioral/Psych: Mood is stable, no new changes  All other systems were reviewed with the patient and are negative.  I have reviewed the past medical history, past surgical history, social history and family history with the patient and they are unchanged from previous note.  ALLERGIES:  is allergic to ivp dye [iodinated diagnostic agents]; propoxyphene; codeine; and ultram [tramadol hcl].  MEDICATIONS:  Current Outpatient Medications  Medication Sig Dispense Refill  . carboxymethylcellulose (REFRESH PLUS) 0.5 % SOLN Place 1 drop into both eyes 3 (three) times daily as needed (for dry eyes.).    Marland Kitchen Cholecalciferol (VITAMIN D3 SUPER STRENGTH) 50 MCG (2000 UT) TABS Take 2,000 Units by mouth daily.    . [employer] SENNA PLUS 8.6-50 MG tablet TAKE 2 TABLETS BY MOUTH AT BEDTIME *HOLD IF HAVING LOOSE STOOLS* 60 tablet 1  . dexamethasone (DECADRON) 4 MG tablet Take 2 tabs at the night before and 2 tab the morning of chemotherapy, every 3 weeks, by mouth 10 tablet 0  . DULoxetine (CYMBALTA) 20 MG capsule Take 1 capsule (20 mg total) by mouth daily. 30 capsule 3  . lidocaine-prilocaine (EMLA) cream Apply to affected area once 30 g 3  . ondansetron (ZOFRAN) 8 MG tablet Take 1 tablet (8 mg total) by mouth  every 8 (eight) hours as needed for refractory nausea / vomiting. Start on day 3 after chemo. 30 tablet 1  . oxyCODONE 10 MG TABS Take 1 tablet (10 mg total) by mouth every 4 (four) hours as needed for severe pain. 60 tablet 0  . polyethylene glycol (MIRALAX) packet Take 17 g by mouth daily. 14 each 1  . prochlorperazine (COMPAZINE) 10 MG tablet Take 1 tablet (10 mg total) by mouth every 6 (six) hours as needed (Nausea or vomiting). 30 tablet 1  . traZODone (DESYREL) 50 MG tablet Take 50 mg by mouth at bedtime as needed for sleep.   0  . triamcinolone cream (KENALOG) 0.5 % Apply 1 application topically 2 (two) times daily. Apply to eyelids/eyebrows  4   No current facility-administered medications for this visit.     PHYSICAL EXAMINATION: ECOG PERFORMANCE STATUS: 1 - Symptomatic but completely ambulatory  Vitals:   07/17/18 0837  BP: (!) 153/82  Pulse: 74  Resp: 18  Temp: 98.1 F (36.7 C)  SpO2: 99%   Filed Weights   07/17/18 0837  Weight: 146 lb 3.2 oz (66.3 kg)    GENERAL:alert, no distress and comfortable SKIN: skin color, texture, turgor are normal, no rashes or significant lesions EYES: normal, Conjunctiva are pink and non-injected, sclera clear OROPHARYNX:no exudate, no erythema and lips, buccal mucosa, and tongue normal  NECK: supple, thyroid normal size, non-tender, without nodularity LYMPH:  no palpable lymphadenopathy in the cervical, axillary or inguinal LUNGS: clear to auscultation and percussion with normal breathing effort HEART: regular rate &  rhythm and no murmurs and no lower extremity edema ABDOMEN:abdomen soft, non-tender and normal bowel sounds Musculoskeletal:no cyanosis of digits and no clubbing  NEURO: alert & oriented x 3 with fluent speech, no focal motor/sensory deficits  LABORATORY DATA:  I have reviewed the data as listed    Component Value Date/Time   NA 142 07/17/2018 0827   K 4.4 07/17/2018 0827   CL 109 07/17/2018 0827   CO2 23 07/17/2018  0827   GLUCOSE 115 (H) 07/17/2018 0827   BUN 18 07/17/2018 0827   CREATININE 0.67 07/17/2018 0827   CALCIUM 9.1 07/17/2018 0827   PROT 7.1 07/17/2018 0827   ALBUMIN 3.8 07/17/2018 0827   AST 16 07/17/2018 0827   ALT 18 07/17/2018 0827   ALKPHOS 53 07/17/2018 0827   BILITOT 0.3 07/17/2018 0827   GFRNONAA >60 07/17/2018 0827   GFRAA >60 07/17/2018 0827    No results found for: SPEP, UPEP  Lab Results  Component Value Date   WBC 16.4 (H) 07/17/2018   NEUTROABS 14.2 (H) 07/17/2018   HGB 12.3 07/17/2018   HCT 38.2 07/17/2018   MCV 86.0 07/17/2018   PLT 172 07/17/2018      Chemistry      Component Value Date/Time   NA 142 07/17/2018 0827   K 4.4 07/17/2018 0827   CL 109 07/17/2018 0827   CO2 23 07/17/2018 0827   BUN 18 07/17/2018 0827   CREATININE 0.67 07/17/2018 0827      Component Value Date/Time   CALCIUM 9.1 07/17/2018 0827   ALKPHOS 53 07/17/2018 0827   AST 16 07/17/2018 0827   ALT 18 07/17/2018 0827   BILITOT 0.3 07/17/2018 0827       RADIOGRAPHIC STUDIES: I have personally reviewed the radiological images as listed and agreed with the findings in the report. Ir Imaging Guided Port Insertion  Result Date: 06/19/2018 INDICATION: 78 year old female with a history of fallopian tube carcinoma EXAM: IMPLANTED PORT A CATH PLACEMENT WITH ULTRASOUND AND FLUOROSCOPIC GUIDANCE MEDICATIONS: 2.0 g Ancef; The antibiotic was administered within an appropriate time interval prior to skin puncture. ANESTHESIA/SEDATION: Moderate (conscious) sedation was employed during this procedure. A total of Versed 2.0 mg and Fentanyl 100 mcg was administered intravenously. Moderate Sedation Time: 16 minutes. The patient's level of consciousness and vital signs were monitored continuously by radiology nursing throughout the procedure under my direct supervision. FLUOROSCOPY TIME:  0 minutes, 12 seconds (1.2 mGy) COMPLICATIONS: None PROCEDURE: The procedure, risks, benefits, and alternatives were  explained to the patient. Questions regarding the procedure were encouraged and answered. The patient understands and consents to the procedure. Ultrasound survey was performed with images stored and sent to PACs. The right neck and chest was prepped with chlorhexidine, and draped in the usual sterile fashion using maximum barrier technique (cap and mask, sterile gown, sterile gloves, large sterile sheet, hand hygiene and cutaneous antiseptic). Antibiotic prophylaxis was provided with 2.0g Ancef administered IV one hour prior to skin incision. Local anesthesia was attained by infiltration with 1% lidocaine without epinephrine. Ultrasound demonstrated patency of the right internal jugular vein, and this was documented with an image. Under real-time ultrasound guidance, this vein was accessed with a 21 gauge micropuncture needle and image documentation was performed. A small dermatotomy was made at the access site with an 11 scalpel. A 0.018" wire was advanced into the SVC and used to estimate the length of the internal catheter. The access needle exchanged for a 40F micropuncture vascular sheath. The 0.018" wire was  then removed and a 0.035" wire advanced into the IVC. An appropriate location for the subcutaneous reservoir was selected below the clavicle and an incision was made through the skin and underlying soft tissues. The subcutaneous tissues were then dissected using a combination of blunt and sharp surgical technique and a pocket was formed. A single lumen power injectable portacatheter was then tunneled through the subcutaneous tissues from the pocket to the dermatotomy and the port reservoir placed within the subcutaneous pocket. The venous access site was then serially dilated and a peel away vascular sheath placed over the wire. The wire was removed and the port catheter advanced into position under fluoroscopic guidance. The catheter tip is positioned in the cavoatrial junction. This was documented with a  spot image. The portacatheter was then tested and found to flush and aspirate well. The port was flushed with saline followed by 100 units/mL heparinized saline. The pocket was then closed in two layers using first subdermal inverted interrupted absorbable sutures followed by a running subcuticular suture. The epidermis was then sealed with Dermabond. The dermatotomy at the venous access site was also seal with Dermabond. Patient tolerated the procedure well and remained hemodynamically stable throughout. No complications encountered and no significant blood loss encountered IMPRESSION: Status post right IJ port catheter placement. Catheter ready for use. Signed, Dulcy Fanny. Dellia Nims, RPVI [facility] Radiology Electronically Signed   By: Corrie Mckusick D.O.   On: 06/19/2018 14:07    All questions were answered. The patient knows to call the clinic with any problems, questions or concerns. No barriers to learning was detected.  I spent 15 minutes counseling the patient face to face. The total time spent in the appointment was 20 minutes and more than 50% was on counseling and review of test results  [name], MD 07/18/2018 9:21 AM

## 2018-07-25 ENCOUNTER — Telehealth: Payer: Self-pay

## 2018-07-25 NOTE — Telephone Encounter (Signed)
Pt stated she had called after hours service reporting soreness around port catheter in neck area and was told to call back this morning. Pt states port was sore yesterday AM after sleeping on that side but this morning no soreness - she used a pillow for support to keep from sleeping on her right side where port is located.  Pt states no redness, no swelling also.  Pt says still has some bruising at port site but skin c/d/i. Pt advised this is normal and to please report to [place] any abnormal s/s in the future - pain, redness, swelling, drainage.  Pt verbalizes understanding.

## 2018-08-07 ENCOUNTER — Other Ambulatory Visit: Payer: Self-pay

## 2018-08-07 ENCOUNTER — Inpatient Hospital Stay: Payer: Medicare HMO

## 2018-08-07 ENCOUNTER — Inpatient Hospital Stay: Payer: Medicare HMO | Attending: Hematology and Oncology

## 2018-08-07 ENCOUNTER — Inpatient Hospital Stay (HOSPITAL_BASED_OUTPATIENT_CLINIC_OR_DEPARTMENT_OTHER): Payer: Medicare HMO | Admitting: Hematology and Oncology

## 2018-08-07 ENCOUNTER — Encounter: Payer: Self-pay | Admitting: Hematology and Oncology

## 2018-08-07 VITALS — BP 154/94

## 2018-08-07 DIAGNOSIS — G620 Drug-induced polyneuropathy: Secondary | ICD-10-CM | POA: Diagnosis not present

## 2018-08-07 DIAGNOSIS — K5909 Other constipation: Secondary | ICD-10-CM | POA: Insufficient documentation

## 2018-08-07 DIAGNOSIS — C5701 Malignant neoplasm of right fallopian tube: Secondary | ICD-10-CM

## 2018-08-07 DIAGNOSIS — C569 Malignant neoplasm of unspecified ovary: Secondary | ICD-10-CM

## 2018-08-07 DIAGNOSIS — Z79899 Other long term (current) drug therapy: Secondary | ICD-10-CM | POA: Diagnosis not present

## 2018-08-07 DIAGNOSIS — T451X5A Adverse effect of antineoplastic and immunosuppressive drugs, initial encounter: Secondary | ICD-10-CM

## 2018-08-07 DIAGNOSIS — Z5111 Encounter for antineoplastic chemotherapy: Secondary | ICD-10-CM | POA: Insufficient documentation

## 2018-08-07 LAB — CMP (CANCER CENTER ONLY)
ALT: 23 U/L (ref 0–44)
AST: 22 U/L (ref 15–41)
Albumin: 3.9 g/dL (ref 3.5–5.0)
Alkaline Phosphatase: 57 U/L (ref 38–126)
Anion gap: 11 (ref 5–15)
BUN: 18 mg/dL (ref 8–23)
CO2: 21 mmol/L — ABNORMAL LOW (ref 22–32)
Calcium: 9.4 mg/dL (ref 8.9–10.3)
Chloride: 108 mmol/L (ref 98–111)
Creatinine: 0.71 mg/dL (ref 0.44–1.00)
GFR, Est AFR Am: >60 mL/min (ref >60)
GFR, Estimated: >60 mL/min (ref >60)
Glucose, Bld: 130 mg/dL — ABNORMAL HIGH (ref 70–99)
Potassium: 4.3 mmol/L (ref 3.5–5.1)
Sodium: 140 mmol/L (ref 135–145)
Total Bilirubin: 0.3 mg/dL (ref 0.3–1.2)
Total Protein: 7.4 g/dL (ref 6.5–8.1)

## 2018-08-07 LAB — CBC WITH DIFFERENTIAL (CANCER CENTER ONLY)
Abs Immature Granulocytes: 0.09 10*3/uL — ABNORMAL HIGH (ref 0.00–0.07)
Basophils Absolute: 0 10*3/uL (ref 0.0–0.1)
Basophils Relative: 0 %
Eosinophils Absolute: 0 10*3/uL (ref 0.0–0.5)
Eosinophils Relative: 0 %
HCT: 40.8 % (ref 36.0–46.0)
Hemoglobin: 13 g/dL (ref 12.0–15.0)
Immature Granulocytes: 1 %
Lymphocytes Relative: 10 %
Lymphs Abs: 1.2 10*3/uL (ref 0.7–4.0)
MCH: 27.7 pg (ref 26.0–34.0)
MCHC: 31.9 g/dL (ref 30.0–36.0)
MCV: 87 fL (ref 80.0–100.0)
Monocytes Absolute: 0.4 10*3/uL (ref 0.1–1.0)
Monocytes Relative: 3 %
Neutro Abs: 10.7 10*3/uL — ABNORMAL HIGH (ref 1.7–7.7)
Neutrophils Relative %: 86 %
Platelet Count: 226 10*3/uL (ref 150–400)
RBC: 4.69 MIL/uL (ref 3.87–5.11)
RDW: 15.8 % — ABNORMAL HIGH (ref 11.5–15.5)
WBC Count: 12.4 10*3/uL — ABNORMAL HIGH (ref 4.0–10.5)
nRBC: 0 % (ref 0.0–0.2)

## 2018-08-07 MED ORDER — PALONOSETRON HCL INJECTION 0.25 MG/5ML
INTRAVENOUS | Status: AC
  Filled 2018-08-07: qty 5

## 2018-08-07 MED ORDER — DIPHENHYDRAMINE HCL 50 MG/ML IJ SOLN
INTRAMUSCULAR | Status: AC
  Filled 2018-08-07: qty 1

## 2018-08-07 MED ORDER — SODIUM CHLORIDE 0.9 % IV SOLN
445.2000 mg | Freq: Once | INTRAVENOUS | Status: AC
  Administered 2018-08-07: 15:00:00 450 mg via INTRAVENOUS
  Filled 2018-08-07: qty 45

## 2018-08-07 MED ORDER — SODIUM CHLORIDE 0.9% FLUSH
10.0000 mL | INTRAVENOUS | Status: DC | PRN
  Administered 2018-08-07: 10 mL
  Filled 2018-08-07: qty 10

## 2018-08-07 MED ORDER — SODIUM CHLORIDE 0.9 % IV SOLN
Freq: Once | INTRAVENOUS | Status: AC
  Administered 2018-08-07: 10:00:00 via INTRAVENOUS
  Filled 2018-08-07: qty 250

## 2018-08-07 MED ORDER — FAMOTIDINE IN NACL 20-0.9 MG/50ML-% IV SOLN: 20.0000 mg | Freq: Once | INTRAVENOUS | Status: DC

## 2018-08-07 MED ORDER — SODIUM CHLORIDE 0.9 % IV SOLN
87.5000 mg/m2 | Freq: Once | INTRAVENOUS | Status: AC
  Administered 2018-08-07: 150 mg via INTRAVENOUS
  Filled 2018-08-07: qty 25

## 2018-08-07 MED ORDER — SODIUM CHLORIDE 0.9 % IV SOLN
Freq: Once | INTRAVENOUS | Status: AC
  Administered 2018-08-07: 10:00:00 via INTRAVENOUS
  Filled 2018-08-07: qty 5

## 2018-08-07 MED ORDER — SODIUM CHLORIDE 0.9% FLUSH
10.0000 mL | Freq: Once | INTRAVENOUS | Status: AC
  Administered 2018-08-07: 10 mL
  Filled 2018-08-07: qty 10

## 2018-08-07 MED ORDER — SODIUM CHLORIDE 0.9 % IV SOLN
20.0000 mg | Freq: Once | INTRAVENOUS | Status: AC
  Administered 2018-08-07: 10:00:00 20 mg via INTRAVENOUS
  Filled 2018-08-07: qty 2

## 2018-08-07 MED ORDER — DIPHENHYDRAMINE HCL 50 MG/ML IJ SOLN
50.0000 mg | Freq: Once | INTRAMUSCULAR | Status: AC
  Administered 2018-08-07: 10:00:00 50 mg via INTRAVENOUS

## 2018-08-07 MED ORDER — HEPARIN SOD (PORK) LOCK FLUSH 100 UNIT/ML IV SOLN
500.0000 [IU] | Freq: Once | INTRAVENOUS | Status: AC | PRN
  Administered 2018-08-07: 500 [IU]
  Filled 2018-08-07: qty 5

## 2018-08-07 MED ORDER — PALONOSETRON HCL INJECTION 0.25 MG/5ML
0.2500 mg | Freq: Once | INTRAVENOUS | Status: AC
  Administered 2018-08-07: 0.25 mg via INTRAVENOUS

## 2018-08-07 NOTE — Assessment & Plan Note (Signed)
Neuropathy is stable with recent dose adjustment.  We will continue similar dosage for future treatment

## 2018-08-07 NOTE — Patient Instructions (Signed)
[facility] Discharge Instructions for Patients Receiving Chemotherapy  Today you received the following chemotherapy agents: Paclitaxel (Taxol) and Carboplatin (Paraplatin)  To help prevent nausea and vomiting after your treatment, we encourage you to take your nausea medication as directed. Received Aloxi during youre treatment today-->Take your Compazine prescription (not Zofran) for the next 3 days as needed. Starting Thursday 06/29/18 you can add Zofran if needed.    If you develop nausea and vomiting that is not controlled by your nausea medication, call the clinic.   BELOW ARE SYMPTOMS THAT SHOULD BE REPORTED IMMEDIATELY:  *FEVER GREATER THAN 100.5 F  *CHILLS WITH OR WITHOUT FEVER  NAUSEA AND VOMITING THAT IS NOT CONTROLLED WITH YOUR NAUSEA MEDICATION  *UNUSUAL SHORTNESS OF BREATH  *UNUSUAL BRUISING OR BLEEDING  TENDERNESS IN MOUTH AND THROAT WITH OR WITHOUT PRESENCE OF ULCERS  *URINARY PROBLEMS  *BOWEL PROBLEMS  UNUSUAL RASH Items with * indicate a potential emergency and should be followed up as soon as possible.  Feel free to call the clinic should you have any questions or concerns. The clinic phone number is [phone].  Please show the [facility] at check-in to the Emergency Department and triage nurse.

## 2018-08-07 NOTE — Assessment & Plan Note (Signed)
Overall, she tolerated reduced dose chemotherapy well Neuropathy is stable Her blood counts are satisfactory We will proceed with treatment without dose adjustment The plan is to complete 6 cycles of chemotherapy.

## 2018-08-07 NOTE — Progress Notes (Signed)
[place] OFFICE PROGRESS NOTE  Patient Care Team: Allie Dimmer, MD as PCP - General (Internal Medicine)  ASSESSMENT & PLAN:  Fallopian tube cancer, carcinoma, right ([place]) Overall, she tolerated reduced dose chemotherapy well Neuropathy is stable Her blood counts are satisfactory We will proceed with treatment without dose adjustment The plan is to complete 6 cycles of chemotherapy.  Other constipation She complained of some mild constipation, well controlled with laxative. She will continue the same  Peripheral neuropathy due to chemotherapy ([place]) Neuropathy is stable with recent dose adjustment.  We will continue similar dosage for future treatment   No orders of the defined types were placed in this encounter.   INTERVAL HISTORY: Please see below for problem oriented charting. She returns for further chemotherapy and follow-up She denies worsening neuropathy Her bowel habits are well controlled with laxatives as needed No recent infection, fever or chills Her abdominal wound incision is healing well.  SUMMARY OF ONCOLOGIC HISTORY: Oncology History   High grade serous, right fallopian tube  HRD, BRCA tumor testing were neg     Ovarian cancer ([place])   03/21/2018 Initial Diagnosis    Ovarian cancer ([place])     Fallopian tube cancer, carcinoma, right ([place])   10/24/2017 Initial Diagnosis    She has presentation of vague abdominal pain    02/20/2018 Imaging    Outside CT abdomen and pelvis: This revealed an incidental finding of a 1.5 cm subcapsular lesion in the lateral upper pole of the left kidney which measured higher than fluid attenuation which could represent a proteinaceous renal cyst or solid renal mass.  There was no ascites.  There is no lymphadenopathy.  There were no masses in the pelvis including ovarian or adnexal masses seen.  There was however soft tissue stranding seen with nodularity in the omental fat in the lower abdomen without  evidence of ascites.  This was felt to represent either carcinomatosis or inflammatory infectious etiologies.  There was colonic diverticulosis seen without radiographic evidence of diverticulitis.  The radiologist recommended MRI of the abdomen to further work-up the renal mass    02/22/2018 Tumor Marker    Patient's tumor was tested for the following markers: CA-125. Results of the tumor marker test revealed 103.    03/06/2018 Pathology Results    Omentum, biopsy - ADENOCARCINOMA WITH PSAMMOMA BODIES, SEE COMMENT. Microscopic Comment There are scattered small foci of malignant glands with associated psammoma bodies. While the tumor is somewhat limited the cells have a more low grade appearance. There is prominent lymphovascular invasion. Immunohistochemistry reveals the cells are positive for cytokeratin 7, ER, MOC31, PAX8, and WT-1. They are negative for calretinin, cytokeratin 20, CDX-2, and TTF-1. Overall, the findings are consistent with adenocarcinoma. The differential includes a primary gynecologic or peritoneal tumor, although the morphology is not typical of a high grade serous carcinoma.    03/06/2018 Surgery    Surgeon: Donaciano Eva   Pre-operative Diagnosis: omental mass, elevated CA 125 Operation: Laparoscopic omentectomy  Surgeon: Donaciano Eva  Operative Findings:  : normal diaphragm and upper omentum. Sigmoid colon densely adherent to left pelvic side wall consistent with history of diverticulitis. Omentum adherent to anterior abdominal wall and sigmoid colon. Surgically absent uterus. Right ovary very small and grossly normal. Left ovary obscured by adhesed sigmoid colon. No ascites. No carcinomatosis. There was a nodular firm distal omentum on the left which was adherent to the sigmoid colon and most consistent with inflammatory change.  03/21/2018 Pathology Results    1. Soft tissue mass, simple excision, midline abdominal wall mass -  METASTATIC SEROUS CARCINOMA. 2. Omentum, resection for tumor - METASTATIC SEROUS CARCINOMA. 3. Adnexa - ovary +/- tube, neoplastic, right - RIGHT FALLOPIAN TUBE: -HIGH GRADE SEROUS CARCINOMA. -SEE ONCOLOGY TABLE. - RIGHT OVARY: SURFACE PSAMMOMA BODIES. NO DEFINITIVE MALIGNANT CELLS. -INCLUSION CYSTS. 4. Adnexa - ovary +/- tube, neoplastic, left - LEFT FALLOPIAN TUBE: LUMINAL AND SURFACE SEROUS CARCINOMA. - LEFT OVARY: FOCAL PSAMMOMA BODIES AND MALIGNANT CELLS. 5. Soft tissue mass, simple excision, left abdominal wall - METASTATIC SEROUS CARCINOMA. Microscopic Comment 3. OVARY or FALLOPIAN TUBE or PRIMARY PERITONEUM: Procedure: Bilateral salpingo-oophorectomy. Abdominal wall mass excision and omentectomy. Specimen Integrity: Intact. Tumor Site: Right fallopian tube. Ovarian Surface Involvement (required only if applicable): Present (left). Fallopian Tube Surface Involvement (required only if applicable): Present. Tumor Size: 1.5 cm. Histologic Type: High grade serous carcinoma. Histologic Grade: High grade. Implants (required for advanced stage serous/seromucinous borderline tumors only): Present. Other Tissue/ Organ Involvement: Omentum, abdominal wall, left fallopian tube and ovary. Largest Extrapelvic Peritoneal Focus (required only if applicable): >2 cm. Peritoneal/Ascitic Fluid: N/A. Treatment Effect (required only for high-grade serous carcinomas): N/A. Regional Lymph Nodes: No lymph nodes submitted or found Pathologic Stage Classification (pTNM, AJCC 8th Edition): pT3c, pNX Representative Tumor Block: 3A Comment(s): None.    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   Surgeon: Thereasa Solo, MD. Specimens: Bilateral tubes / ovaries, omentum. Midline  abdominal wall mass, left lateral abdominal wall mass.    Operative Findings:  Abdominal wall masses consistent with port site metastases at the umbilical incision (7cm) and left lateral incision (5cm). Omental caking in the infracolic omentum. Grossly normal very small tubes and ovaries. Normal diaphragms. No lymphadenopathy. Normal small and large intestine with the exception of miliary studding of tumor on the surface of the sigmoid colon and rectum in the cul de sac.    This represented an optimal cytoreduction (R1) with no gross visible disease remaining, however there was a thin rind of tumor on the lateral left fascia associated with the port site metastasis.      04/11/2018 Cancer Staging    Staging form: Ovary, Fallopian Tube, and Primary Peritoneal Carcinoma, AJCC 8th Edition - Pathologic: FIGO Stage IIIC (pT3, pN0, cM0) - Signed by [name], MD on 04/11/2018    05/23/2018 Imaging    1. Interval resolution of the anterior midline abdominal wall seroma. There is some persistent wispy soft tissue attenuation in the region of the midline incision, nonspecific and may simply reflect granulation tissue from surgery. 2. Stable 13 mm exophytic lesion posterior left kidney with attenuation higher than expected for a simple cyst. This may be a cyst complicated by proteinaceous debris or hemorrhage, but attention on follow-up recommended. 3. Stable mild persistent distention of proximal jejunal loops with gradual tapering to nondilated distal small bowel. 4. No overt peritoneal or mesenteric disease identified on this exam.    05/23/2018 Tumor Marker    Patient's tumor was tested for the following markers: CA-125 Results of the tumor marker test revealed 11.6    06/02/2018 -  Chemotherapy    The patient had carboplatin and taxol    06/19/2018 Procedure    Status post right IJ port catheter placement. Catheter ready for use.    06/26/2018 Tumor Marker  Patient's tumor was tested for the  following markers: CA-125. Results of the tumor marker test revealed 10.7     REVIEW OF SYSTEMS:   Constitutional: Denies fevers, chills or abnormal weight loss Eyes: Denies blurriness of vision Ears, nose, mouth, throat, and face: Denies mucositis or sore throat Respiratory: Denies cough, dyspnea or wheezes Cardiovascular: Denies palpitation, chest discomfort or lower extremity swelling Skin: Denies abnormal skin rashes Lymphatics: Denies new lymphadenopathy or easy bruising Behavioral/Psych: Mood is stable, no new changes  All other systems were reviewed with the patient and are negative.  I have reviewed the past medical history, past surgical history, social history and family history with the patient and they are unchanged from previous note.  ALLERGIES:  is allergic to ivp dye [iodinated diagnostic agents]; propoxyphene; codeine; and ultram [tramadol hcl].  MEDICATIONS:  Current Outpatient Medications  Medication Sig Dispense Refill  . carboxymethylcellulose (REFRESH PLUS) 0.5 % SOLN Place 1 drop into both eyes 3 (three) times daily as needed (for dry eyes.).    Marland Kitchen Cholecalciferol (VITAMIN D3 SUPER STRENGTH) 50 MCG (2000 UT) TABS Take 2,000 Units by mouth daily.    . [employer] SENNA PLUS 8.6-50 MG tablet TAKE 2 TABLETS BY MOUTH AT BEDTIME *HOLD IF HAVING LOOSE STOOLS* 60 tablet 1  . dexamethasone (DECADRON) 4 MG tablet Take 2 tabs at the night before and 2 tab the morning of chemotherapy, every 3 weeks, by mouth 10 tablet 0  . DULoxetine (CYMBALTA) 20 MG capsule Take 1 capsule (20 mg total) by mouth daily. 30 capsule 3  . lidocaine-prilocaine (EMLA) cream Apply to affected area once 30 g 3  . ondansetron (ZOFRAN) 8 MG tablet Take 1 tablet (8 mg total) by mouth every 8 (eight) hours as needed for refractory nausea / vomiting. Start on day 3 after chemo. 30 tablet 1  . oxyCODONE 10 MG TABS Take 1 tablet (10 mg total) by mouth every 4 (four) hours as needed for severe pain. 60 tablet 0  .  polyethylene glycol (MIRALAX) packet Take 17 g by mouth daily. 14 each 1  . prochlorperazine (COMPAZINE) 10 MG tablet Take 1 tablet (10 mg total) by mouth every 6 (six) hours as needed (Nausea or vomiting). 30 tablet 1  . traZODone (DESYREL) 50 MG tablet Take 50 mg by mouth at bedtime as needed for sleep.   0  . triamcinolone cream (KENALOG) 0.5 % Apply 1 application topically 2 (two) times daily. Apply to eyelids/eyebrows  4   No current facility-administered medications for this visit.    Facility-Administered Medications Ordered in Other Visits  Medication Dose Route Frequency Provider Last Rate Last Dose  . CARBOplatin (PARAPLATIN) 450 mg in sodium chloride 0.9 % 250 mL chemo infusion  450 mg Intravenous Once Alvy Bimler, Lucy Woolever, MD      . fosaprepitant (EMEND) 150 mg, dexamethasone (DECADRON) 12 mg in sodium chloride 0.9 % 145 mL IVPB   Intravenous Once [name], MD 454 mL/hr at 08/07/18 1012    . heparin lock flush 100 unit/mL  500 Units Intracatheter Once PRN Alvy Bimler, Alynna Hargrove, MD      . PACLitaxel (TAXOL) 150 mg in sodium chloride 0.9 % 250 mL chemo infusion (> 54m/m2)  87.5 mg/m2 (Treatment Plan Recorded) Intravenous Once Abdi Husak, MD      . sodium chloride flush (NS) 0.9 % injection 10 mL  10 mL Intracatheter PRN GAlvy Bimler Joplin Canty, MD        PHYSICAL EXAMINATION: ECOG PERFORMANCE STATUS: 1 - Symptomatic but completely  ambulatory  Vitals:   08/07/18 0839  BP: (!) 150/95  Pulse: 99  Resp: 16  Temp: 98.3 F (36.8 C)  SpO2: 95%   Filed Weights   08/07/18 0839  Weight: 152 lb 12.8 oz (69.3 kg)    GENERAL:alert, no distress and comfortable SKIN: skin color, texture, turgor are normal, no rashes or significant lesions EYES: normal, Conjunctiva are pink and non-injected, sclera clear OROPHARYNX:no exudate, no erythema and lips, buccal mucosa, and tongue normal  NECK: supple, thyroid normal size, non-tender, without nodularity LYMPH:  no palpable lymphadenopathy in the cervical, axillary or  inguinal LUNGS: clear to auscultation and percussion with normal breathing effort HEART: regular rate & rhythm and no murmurs and no lower extremity edema ABDOMEN:abdomen soft, non-tender and normal bowel sounds Musculoskeletal:no cyanosis of digits and no clubbing  NEURO: alert & oriented x 3 with fluent speech, no focal motor/sensory deficits  LABORATORY DATA:  I have reviewed the data as listed    Component Value Date/Time   NA 140 08/07/2018 0743   K 4.3 08/07/2018 0743   CL 108 08/07/2018 0743   CO2 21 (L) 08/07/2018 0743   GLUCOSE 130 (H) 08/07/2018 0743   BUN 18 08/07/2018 0743   CREATININE 0.71 08/07/2018 0743   CALCIUM 9.4 08/07/2018 0743   PROT 7.4 08/07/2018 0743   ALBUMIN 3.9 08/07/2018 0743   AST 22 08/07/2018 0743   ALT 23 08/07/2018 0743   ALKPHOS 57 08/07/2018 0743   BILITOT 0.3 08/07/2018 0743   GFRNONAA >60 08/07/2018 0743   GFRAA >60 08/07/2018 0743    No results found for: SPEP, UPEP  Lab Results  Component Value Date   WBC 12.4 (H) 08/07/2018   NEUTROABS 10.7 (H) 08/07/2018   HGB 13.0 08/07/2018   HCT 40.8 08/07/2018   MCV 87.0 08/07/2018   PLT 226 08/07/2018      Chemistry      Component Value Date/Time   NA 140 08/07/2018 0743   K 4.3 08/07/2018 0743   CL 108 08/07/2018 0743   CO2 21 (L) 08/07/2018 0743   BUN 18 08/07/2018 0743   CREATININE 0.71 08/07/2018 0743      Component Value Date/Time   CALCIUM 9.4 08/07/2018 0743   ALKPHOS 57 08/07/2018 0743   AST 22 08/07/2018 0743   ALT 23 08/07/2018 0743   BILITOT 0.3 08/07/2018 0743       All questions were answered. The patient knows to call the clinic with any problems, questions or concerns. No barriers to learning was detected.  I spent 15 minutes counseling the patient face to face. The total time spent in the appointment was 20 minutes and more than 50% was on counseling and review of test results  [name], MD 08/07/2018 10:18 AM

## 2018-08-07 NOTE — Assessment & Plan Note (Signed)
She complained of some mild constipation, well controlled with laxative. She will continue the same

## 2018-08-28 ENCOUNTER — Other Ambulatory Visit: Payer: Self-pay

## 2018-08-28 ENCOUNTER — Inpatient Hospital Stay: Payer: Medicare HMO | Attending: Gynecologic Oncology

## 2018-08-28 ENCOUNTER — Inpatient Hospital Stay: Payer: Medicare HMO

## 2018-08-28 ENCOUNTER — Encounter: Payer: Self-pay | Admitting: Hematology and Oncology

## 2018-08-28 ENCOUNTER — Telehealth: Payer: Self-pay | Admitting: *Deleted

## 2018-08-28 ENCOUNTER — Inpatient Hospital Stay (HOSPITAL_BASED_OUTPATIENT_CLINIC_OR_DEPARTMENT_OTHER): Payer: Medicare HMO | Admitting: Hematology and Oncology

## 2018-08-28 VITALS — HR 99

## 2018-08-28 DIAGNOSIS — G620 Drug-induced polyneuropathy: Secondary | ICD-10-CM | POA: Insufficient documentation

## 2018-08-28 DIAGNOSIS — C5701 Malignant neoplasm of right fallopian tube: Secondary | ICD-10-CM

## 2018-08-28 DIAGNOSIS — R030 Elevated blood-pressure reading, without diagnosis of hypertension: Secondary | ICD-10-CM | POA: Diagnosis not present

## 2018-08-28 DIAGNOSIS — C569 Malignant neoplasm of unspecified ovary: Secondary | ICD-10-CM

## 2018-08-28 DIAGNOSIS — M2550 Pain in unspecified joint: Secondary | ICD-10-CM | POA: Insufficient documentation

## 2018-08-28 DIAGNOSIS — Z5111 Encounter for antineoplastic chemotherapy: Secondary | ICD-10-CM | POA: Diagnosis not present

## 2018-08-28 DIAGNOSIS — C561 Malignant neoplasm of right ovary: Secondary | ICD-10-CM

## 2018-08-28 DIAGNOSIS — T451X5A Adverse effect of antineoplastic and immunosuppressive drugs, initial encounter: Secondary | ICD-10-CM

## 2018-08-28 DIAGNOSIS — Z79899 Other long term (current) drug therapy: Secondary | ICD-10-CM | POA: Diagnosis not present

## 2018-08-28 DIAGNOSIS — I10 Essential (primary) hypertension: Secondary | ICD-10-CM | POA: Insufficient documentation

## 2018-08-28 LAB — CBC WITH DIFFERENTIAL (CANCER CENTER ONLY)
Abs Immature Granulocytes: 0.15 10*3/uL — ABNORMAL HIGH (ref 0.00–0.07)
Basophils Absolute: 0 10*3/uL (ref 0.0–0.1)
Basophils Relative: 0 %
Eosinophils Absolute: 0 10*3/uL (ref 0.0–0.5)
Eosinophils Relative: 0 %
HCT: 39 % (ref 36.0–46.0)
Hemoglobin: 12.5 g/dL (ref 12.0–15.0)
Immature Granulocytes: 1 %
Lymphocytes Relative: 11 %
Lymphs Abs: 1.5 10*3/uL (ref 0.7–4.0)
MCH: 28.3 pg (ref 26.0–34.0)
MCHC: 32.1 g/dL (ref 30.0–36.0)
MCV: 88.2 fL (ref 80.0–100.0)
Monocytes Absolute: 0.2 10*3/uL (ref 0.1–1.0)
Monocytes Relative: 2 %
Neutro Abs: 11.4 10*3/uL — ABNORMAL HIGH (ref 1.7–7.7)
Neutrophils Relative %: 86 %
Platelet Count: 188 10*3/uL (ref 150–400)
RBC: 4.42 MIL/uL (ref 3.87–5.11)
RDW: 15.8 % — ABNORMAL HIGH (ref 11.5–15.5)
WBC Count: 13.2 10*3/uL — ABNORMAL HIGH (ref 4.0–10.5)
nRBC: 0 % (ref 0.0–0.2)

## 2018-08-28 LAB — CMP (CANCER CENTER ONLY)
ALT: 22 U/L (ref 0–44)
AST: 21 U/L (ref 15–41)
Albumin: 4 g/dL (ref 3.5–5.0)
Alkaline Phosphatase: 56 U/L (ref 38–126)
Anion gap: 14 (ref 5–15)
BUN: 15 mg/dL (ref 8–23)
CO2: 19 mmol/L — ABNORMAL LOW (ref 22–32)
Calcium: 9.3 mg/dL (ref 8.9–10.3)
Chloride: 109 mmol/L (ref 98–111)
Creatinine: 0.74 mg/dL (ref 0.44–1.00)
GFR, Est AFR Am: >60 mL/min (ref >60)
GFR, Estimated: >60 mL/min (ref >60)
Glucose, Bld: 130 mg/dL — ABNORMAL HIGH (ref 70–99)
Potassium: 4.1 mmol/L (ref 3.5–5.1)
Sodium: 142 mmol/L (ref 135–145)
Total Bilirubin: 0.2 mg/dL — ABNORMAL LOW (ref 0.3–1.2)
Total Protein: 7.5 g/dL (ref 6.5–8.1)

## 2018-08-28 MED ORDER — SODIUM CHLORIDE 0.9 % IV SOLN
87.5000 mg/m2 | Freq: Once | INTRAVENOUS | Status: AC
  Administered 2018-08-28: 150 mg via INTRAVENOUS
  Filled 2018-08-28: qty 25

## 2018-08-28 MED ORDER — FAMOTIDINE IN NACL 20-0.9 MG/50ML-% IV SOLN
INTRAVENOUS | Status: AC
  Filled 2018-08-28: qty 50

## 2018-08-28 MED ORDER — COLD PACK MISC ONCOLOGY
1.0000 | Freq: Once | Status: DC | PRN
  Filled 2018-08-28: qty 1

## 2018-08-28 MED ORDER — FAMOTIDINE IN NACL 20-0.9 MG/50ML-% IV SOLN
20.0000 mg | Freq: Once | INTRAVENOUS | Status: AC
  Administered 2018-08-28: 20 mg via INTRAVENOUS

## 2018-08-28 MED ORDER — SODIUM CHLORIDE 0.9 % IV SOLN
Freq: Once | INTRAVENOUS | Status: AC
  Administered 2018-08-28: 10:00:00 via INTRAVENOUS
  Filled 2018-08-28: qty 250

## 2018-08-28 MED ORDER — HEPARIN SOD (PORK) LOCK FLUSH 100 UNIT/ML IV SOLN
500.0000 [IU] | Freq: Once | INTRAVENOUS | Status: AC | PRN
  Administered 2018-08-28: 16:00:00 500 [IU]
  Filled 2018-08-28: qty 5

## 2018-08-28 MED ORDER — SODIUM CHLORIDE 0.9% FLUSH
10.0000 mL | Freq: Once | INTRAVENOUS | Status: AC
  Administered 2018-08-28: 08:00:00 10 mL
  Filled 2018-08-28: qty 10

## 2018-08-28 MED ORDER — SODIUM CHLORIDE 0.9 % IV SOLN
445.2000 mg | Freq: Once | INTRAVENOUS | Status: AC
  Administered 2018-08-28: 450 mg via INTRAVENOUS
  Filled 2018-08-28: qty 45

## 2018-08-28 MED ORDER — DIPHENHYDRAMINE HCL 50 MG/ML IJ SOLN
50.0000 mg | Freq: Once | INTRAMUSCULAR | Status: AC
  Administered 2018-08-28: 10:00:00 50 mg via INTRAVENOUS

## 2018-08-28 MED ORDER — SODIUM CHLORIDE 0.9 % IV SOLN
Freq: Once | INTRAVENOUS | Status: AC
  Administered 2018-08-28: 11:00:00 via INTRAVENOUS
  Filled 2018-08-28: qty 5

## 2018-08-28 MED ORDER — PALONOSETRON HCL INJECTION 0.25 MG/5ML
0.2500 mg | Freq: Once | INTRAVENOUS | Status: AC
  Administered 2018-08-28: 0.25 mg via INTRAVENOUS

## 2018-08-28 MED ORDER — DIPHENHYDRAMINE HCL 50 MG/ML IJ SOLN
INTRAMUSCULAR | Status: AC
  Filled 2018-08-28: qty 1

## 2018-08-28 MED ORDER — PALONOSETRON HCL INJECTION 0.25 MG/5ML
INTRAVENOUS | Status: AC
  Filled 2018-08-28: qty 5

## 2018-08-28 MED ORDER — SODIUM CHLORIDE 0.9% FLUSH
10.0000 mL | INTRAVENOUS | Status: DC | PRN
  Administered 2018-08-28: 10 mL
  Filled 2018-08-28: qty 10

## 2018-08-28 NOTE — Assessment & Plan Note (Signed)
She has nonspecific arthralgia throughout likely secondary to side effects of treatment She will continue to take intermittent doses of oxycodone and Cymbalta

## 2018-08-28 NOTE — Telephone Encounter (Signed)
Called and spoke with the patient's husband, gave the appts for 5/27

## 2018-08-28 NOTE — Progress Notes (Signed)
[facility] OFFICE PROGRESS NOTE  Patient Care Team: Allie Dimmer, MD as PCP - General (Internal Medicine)  ASSESSMENT & PLAN:  Fallopian tube cancer, carcinoma, right ([place]) Overall, she tolerated reduced dose chemotherapy well Neuropathy is stable Her blood counts are satisfactory We will proceed with treatment without dose adjustment The plan is to complete 6 cycles of chemotherapy and then repeat imaging study next month.  Peripheral neuropathy due to chemotherapy (HCC) Neuropathy is stable with recent dose adjustment.  We will continue similar dosage for future treatment  Arthralgia She has nonspecific arthralgia throughout likely secondary to side effects of treatment She will continue to take intermittent doses of oxycodone and Cymbalta  Elevated BP without diagnosis of hypertension Her blood pressure is intermittently elevated, likely secondary to her treatment Observe only for now She is not symptomatic   No orders of the defined types were placed in this encounter.   INTERVAL HISTORY: Please see below for problem oriented charting. She returns for further follow-up, to be seen prior to cycle 5 of chemotherapy She has diffuse muscle aches and pains throughout that comes and goes over the last 3 weeks Peripheral neuropathy is stable Denies recent infection, fever or chills No concern for abdominal wound healing No recent nausea or changes in bowel habits  SUMMARY OF ONCOLOGIC HISTORY: Oncology History   High grade serous, right fallopian tube  HRD, BRCA tumor testing were neg     Ovarian cancer ([place])   03/21/2018 Initial Diagnosis    Ovarian cancer ([place])     Fallopian tube cancer, carcinoma, right ([place])   10/24/2017 Initial Diagnosis    She has presentation of vague abdominal pain    02/20/2018 Imaging    Outside CT abdomen and pelvis: This revealed an incidental finding of a 1.5 cm subcapsular lesion in the lateral upper pole of the left  kidney which measured higher than fluid attenuation which could represent a proteinaceous renal cyst or solid renal mass.  There was no ascites.  There is no lymphadenopathy.  There were no masses in the pelvis including ovarian or adnexal masses seen.  There was however soft tissue stranding seen with nodularity in the omental fat in the lower abdomen without evidence of ascites.  This was felt to represent either carcinomatosis or inflammatory infectious etiologies.  There was colonic diverticulosis seen without radiographic evidence of diverticulitis.  The radiologist recommended MRI of the abdomen to further work-up the renal mass    02/22/2018 Tumor Marker    Patient's tumor was tested for the following markers: CA-125. Results of the tumor marker test revealed 103.    03/06/2018 Pathology Results    Omentum, biopsy - ADENOCARCINOMA WITH PSAMMOMA BODIES, SEE COMMENT. Microscopic Comment There are scattered small foci of malignant glands with associated psammoma bodies. While the tumor is somewhat limited the cells have a more low grade appearance. There is prominent lymphovascular invasion. Immunohistochemistry reveals the cells are positive for cytokeratin 7, ER, MOC31, PAX8, and WT-1. They are negative for calretinin, cytokeratin 20, CDX-2, and TTF-1. Overall, the findings are consistent with adenocarcinoma. The differential includes a primary gynecologic or peritoneal tumor, although the morphology is not typical of a high grade serous carcinoma.    03/06/2018 Surgery    Surgeon: Donaciano Eva   Pre-operative Diagnosis: omental mass, elevated CA 125 Operation: Laparoscopic omentectomy  Surgeon: Donaciano Eva  Operative Findings:  : normal diaphragm and upper omentum. Sigmoid colon densely adherent to left pelvic side wall consistent  with history of diverticulitis. Omentum adherent to anterior abdominal wall and sigmoid colon. Surgically absent uterus. Right ovary very  small and grossly normal. Left ovary obscured by adhesed sigmoid colon. No ascites. No carcinomatosis. There was a nodular firm distal omentum on the left which was adherent to the sigmoid colon and most consistent with inflammatory change.       03/21/2018 Pathology Results    1. Soft tissue mass, simple excision, midline abdominal wall mass - METASTATIC SEROUS CARCINOMA. 2. Omentum, resection for tumor - METASTATIC SEROUS CARCINOMA. 3. Adnexa - ovary +/- tube, neoplastic, right - RIGHT FALLOPIAN TUBE: -HIGH GRADE SEROUS CARCINOMA. -SEE ONCOLOGY TABLE. - RIGHT OVARY: SURFACE PSAMMOMA BODIES. NO DEFINITIVE MALIGNANT CELLS. -INCLUSION CYSTS. 4. Adnexa - ovary +/- tube, neoplastic, left - LEFT FALLOPIAN TUBE: LUMINAL AND SURFACE SEROUS CARCINOMA. - LEFT OVARY: FOCAL PSAMMOMA BODIES AND MALIGNANT CELLS. 5. Soft tissue mass, simple excision, left abdominal wall - METASTATIC SEROUS CARCINOMA. Microscopic Comment 3. OVARY or FALLOPIAN TUBE or PRIMARY PERITONEUM: Procedure: Bilateral salpingo-oophorectomy. Abdominal wall mass excision and omentectomy. Specimen Integrity: Intact. Tumor Site: Right fallopian tube. Ovarian Surface Involvement (required only if applicable): Present (left). Fallopian Tube Surface Involvement (required only if applicable): Present. Tumor Size: 1.5 cm. Histologic Type: High grade serous carcinoma. Histologic Grade: High grade. Implants (required for advanced stage serous/seromucinous borderline tumors only): Present. Other Tissue/ Organ Involvement: Omentum, abdominal wall, left fallopian tube and ovary. Largest Extrapelvic Peritoneal Focus (required only if applicable): >2 cm. Peritoneal/Ascitic Fluid: N/A. Treatment Effect (required only for high-grade serous carcinomas): N/A. Regional Lymph Nodes: No lymph nodes submitted or found Pathologic Stage Classification (pTNM, AJCC 8th Edition): pT3c, pNX Representative Tumor Block: 3A Comment(s): None.     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   Surgeon: Thereasa Solo, MD. Specimens: Bilateral tubes / ovaries, omentum. Midline abdominal wall mass, left lateral abdominal wall mass.    Operative Findings:  Abdominal wall masses consistent with port site metastases at the umbilical incision (7cm) and left lateral incision (5cm). Omental caking in the infracolic omentum. Grossly normal very small tubes and ovaries. Normal diaphragms. No lymphadenopathy. Normal small and large intestine with the exception of miliary studding of tumor on the surface of the sigmoid colon and rectum in the cul de sac.    This represented an optimal cytoreduction (R1) with no gross visible disease remaining, however there was a thin rind of tumor on the lateral left fascia associated with the port site metastasis.      04/11/2018 Cancer Staging    Staging form: Ovary, Fallopian Tube, and Primary Peritoneal Carcinoma, AJCC 8th Edition - Pathologic: FIGO Stage IIIC (pT3, pN0, cM0) - Signed by [name], MD on 04/11/2018    05/23/2018 Imaging    1. Interval resolution of the anterior midline abdominal wall seroma. There is some persistent wispy soft tissue attenuation in the region of the midline incision, nonspecific and may simply reflect granulation tissue from surgery. 2. Stable 13 mm exophytic lesion posterior left kidney with attenuation higher than expected for a simple cyst. This may be a cyst complicated by proteinaceous debris or hemorrhage, but attention on follow-up recommended. 3. Stable mild persistent distention of proximal jejunal loops with gradual tapering to nondilated distal small bowel. 4. No overt peritoneal or mesenteric disease identified on this exam.    05/23/2018 Tumor  Marker    Patient's tumor was tested for the following markers: CA-125 Results of the tumor marker test revealed 11.6    06/02/2018 -  Chemotherapy    The patient had carboplatin and taxol    06/19/2018 Procedure    Status post right IJ port catheter placement. Catheter ready for use.    06/26/2018 Tumor Marker    Patient's tumor was tested for the following markers: CA-125. Results of the tumor marker test revealed 10.7     REVIEW OF SYSTEMS:   Constitutional: Denies fevers, chills or abnormal weight loss Eyes: Denies blurriness of vision Ears, nose, mouth, throat, and face: Denies mucositis or sore throat Respiratory: Denies cough, dyspnea or wheezes Cardiovascular: Denies palpitation, chest discomfort or lower extremity swelling Gastrointestinal:  Denies nausea, heartburn or change in bowel habits Skin: Denies abnormal skin rashes Lymphatics: Denies new lymphadenopathy or easy bruising Neurological:Denies numbness, tingling or new weaknesses Behavioral/Psych: Mood is stable, no new changes  All other systems were reviewed with the patient and are negative.  I have reviewed the past medical history, past surgical history, social history and family history with the patient and they are unchanged from previous note.  ALLERGIES:  is allergic to ivp dye [iodinated diagnostic agents]; propoxyphene; codeine; and ultram [tramadol hcl].  MEDICATIONS:  Current Outpatient Medications  Medication Sig Dispense Refill  . carboxymethylcellulose (REFRESH PLUS) 0.5 % SOLN Place 1 drop into both eyes 3 (three) times daily as needed (for dry eyes.).    Mar[employer] Kitchen Cholecalciferol (VITAMIN D3 SUPER STRENGTH) 50 MCG (2000 UT) TABS Take 2,000 Units by mouth daily.    . [employer] SENNA PLUS 8.6-50 MG tablet TAKE 2 TABLETS BY MOUTH AT BEDTIME *HOLD IF HAVING LOOSE STOOLS* 60 tablet 1  . dexamethasone (DECADRON) 4 MG tablet Take 2 tabs at the night before and 2 tab the morning of chemotherapy, every 3 weeks, by mouth 10  tablet 0  . DULoxetine (CYMBALTA) 20 MG capsule Take 1 capsule (20 mg total) by mouth daily. 30 capsule 3  . lidocaine-prilocaine (EMLA) cream Apply to affected area once 30 g 3  . ondansetron (ZOFRAN) 8 MG tablet Take 1 tablet (8 mg total) by mouth every 8 (eight) hours as needed for refractory nausea / vomiting. Start on day 3 after chemo. 30 tablet 1  . oxyCODONE 10 MG TABS Take 1 tablet (10 mg total) by mouth every 4 (four) hours as needed for severe pain. 60 tablet 0  . polyethylene glycol (MIRALAX) packet Take 17 g by mouth daily. 14 each 1  . prochlorperazine (COMPAZINE) 10 MG tablet Take 1 tablet (10 mg total) by mouth every 6 (six) hours as needed (Nausea or vomiting). 30 tablet 1  . traZODone (DESYREL) 50 MG tablet Take 50 mg by mouth at bedtime as needed for sleep.   0  . triamcinolone cream (KENALOG) 0.5 % Apply 1 application topically 2 (two) times daily. Apply to eyelids/eyebrows  4   No current facility-administered medications for this visit.     PHYSICAL EXAMINATION: ECOG PERFORMANCE STATUS: 1 - Symptomatic but completely ambulatory  Vitals:   08/28/18 0831  BP: (!) 152/95  Pulse: (!) 101  Resp: 18  Temp: 98 F (36.7 C)  SpO2: 98%   Filed Weights   08/28/18 0831  Weight: 150 lb 12.8 oz (68.4 kg)    GENERAL:alert, no distress and comfortable SKIN: skin color, texture, turgor are normal, no rashes or significant lesions EYES: normal, Conjunctiva are pink and non-injected, sclera clear  OROPHARYNX:no exudate, no erythema and lips, buccal mucosa, and tongue normal  NECK: supple, thyroid normal size, non-tender, without nodularity LYMPH:  no palpable lymphadenopathy in the cervical, axillary or inguinal LUNGS: clear to auscultation and percussion with normal breathing effort HEART: regular rate & rhythm and no murmurs and no lower extremity edema ABDOMEN:abdomen soft, non-tender and normal bowel sounds Musculoskeletal:no cyanosis of digits and no clubbing  NEURO:  alert & oriented x 3 with fluent speech, no focal motor/sensory deficits  LABORATORY DATA:  I have reviewed the data as listed    Component Value Date/Time   NA 142 08/28/2018 0747   K 4.1 08/28/2018 0747   CL 109 08/28/2018 0747   CO2 19 (L) 08/28/2018 0747   GLUCOSE 130 (H) 08/28/2018 0747   BUN 15 08/28/2018 0747   CREATININE 0.74 08/28/2018 0747   CALCIUM 9.3 08/28/2018 0747   PROT 7.5 08/28/2018 0747   ALBUMIN 4.0 08/28/2018 0747   AST 21 08/28/2018 0747   ALT 22 08/28/2018 0747   ALKPHOS 56 08/28/2018 0747   BILITOT 0.2 (L) 08/28/2018 0747   GFRNONAA >60 08/28/2018 0747   GFRAA >60 08/28/2018 0747    No results found for: SPEP, UPEP  Lab Results  Component Value Date   WBC 13.2 (H) 08/28/2018   NEUTROABS 11.4 (H) 08/28/2018   HGB 12.5 08/28/2018   HCT 39.0 08/28/2018   MCV 88.2 08/28/2018   PLT 188 08/28/2018      Chemistry      Component Value Date/Time   NA 142 08/28/2018 0747   K 4.1 08/28/2018 0747   CL 109 08/28/2018 0747   CO2 19 (L) 08/28/2018 0747   BUN 15 08/28/2018 0747   CREATININE 0.74 08/28/2018 0747      Component Value Date/Time   CALCIUM 9.3 08/28/2018 0747   ALKPHOS 56 08/28/2018 0747   AST 21 08/28/2018 0747   ALT 22 08/28/2018 0747   BILITOT 0.2 (L) 08/28/2018 0747      All questions were answered. The patient knows to call the clinic with any problems, questions or concerns. No barriers to learning was detected.  I spent 15 minutes counseling the patient face to face. The total time spent in the appointment was 20 minutes and more than 50% was on counseling and review of test results  [name], MD 08/28/2018 9:01 AM

## 2018-08-28 NOTE — Assessment & Plan Note (Signed)
Her blood pressure is intermittently elevated, likely secondary to her treatment Observe only for now She is not symptomatic

## 2018-08-28 NOTE — Assessment & Plan Note (Signed)
Overall, she tolerated reduced dose chemotherapy well Neuropathy is stable Her blood counts are satisfactory We will proceed with treatment without dose adjustment The plan is to complete 6 cycles of chemotherapy and then repeat imaging study next month.

## 2018-08-28 NOTE — Assessment & Plan Note (Signed)
Neuropathy is stable with recent dose adjustment.  We will continue similar dosage for future treatment

## 2018-08-28 NOTE — Patient Instructions (Addendum)
[facility] Discharge Instructions for Patients Receiving Chemotherapy  Today you received the following chemotherapy agents: Taxol, Carboplatin  To help prevent nausea and vomiting after your treatment, we encourage you to take your nausea medication: As directed by MD   If you develop nausea and vomiting that is not controlled by your nausea medication, call the clinic.   BELOW ARE SYMPTOMS THAT SHOULD BE REPORTED IMMEDIATELY:  *FEVER GREATER THAN 100.5 F  *CHILLS WITH OR WITHOUT FEVER  NAUSEA AND VOMITING THAT IS NOT CONTROLLED WITH YOUR NAUSEA MEDICATION  *UNUSUAL SHORTNESS OF BREATH  *UNUSUAL BRUISING OR BLEEDING  TENDERNESS IN MOUTH AND THROAT WITH OR WITHOUT PRESENCE OF ULCERS  *URINARY PROBLEMS  *BOWEL PROBLEMS  UNUSUAL RASH Items with * indicate a potential emergency and should be followed up as soon as possible.  Feel free to call the clinic should you have any questions or concerns. The clinic phone number is [phone].  Please show the CHEMO ALERT CARD at check-in to the Emergency Department and triage nurse.   

## 2018-09-01 ENCOUNTER — Encounter: Payer: Self-pay | Admitting: *Deleted

## 2018-09-06 ENCOUNTER — Telehealth: Payer: Self-pay

## 2018-09-06 NOTE — Telephone Encounter (Signed)
She called and left a message to call her.  Called back. She started taking Amlodipine 5 mg daily that her PCP ordered. Should she continue with Amlodipine? Instructed to continue with BP medication and check bp  2 x a day. Record reading and bring with her on next visit.

## 2018-09-20 ENCOUNTER — Inpatient Hospital Stay: Payer: Medicare HMO

## 2018-09-20 ENCOUNTER — Other Ambulatory Visit: Payer: Self-pay

## 2018-09-20 ENCOUNTER — Inpatient Hospital Stay (HOSPITAL_BASED_OUTPATIENT_CLINIC_OR_DEPARTMENT_OTHER): Payer: Medicare HMO | Admitting: Hematology and Oncology

## 2018-09-20 ENCOUNTER — Encounter: Payer: Self-pay | Admitting: Hematology and Oncology

## 2018-09-20 VITALS — BP 153/75 | HR 98 | Temp 99.2°F | Resp 18 | Ht 64.0 in | Wt 156.6 lb

## 2018-09-20 DIAGNOSIS — M2550 Pain in unspecified joint: Secondary | ICD-10-CM

## 2018-09-20 DIAGNOSIS — G620 Drug-induced polyneuropathy: Secondary | ICD-10-CM | POA: Diagnosis not present

## 2018-09-20 DIAGNOSIS — T451X5A Adverse effect of antineoplastic and immunosuppressive drugs, initial encounter: Secondary | ICD-10-CM

## 2018-09-20 DIAGNOSIS — C569 Malignant neoplasm of unspecified ovary: Secondary | ICD-10-CM

## 2018-09-20 DIAGNOSIS — Z79899 Other long term (current) drug therapy: Secondary | ICD-10-CM

## 2018-09-20 DIAGNOSIS — C561 Malignant neoplasm of right ovary: Secondary | ICD-10-CM | POA: Diagnosis not present

## 2018-09-20 DIAGNOSIS — C5701 Malignant neoplasm of right fallopian tube: Secondary | ICD-10-CM

## 2018-09-20 DIAGNOSIS — Z5111 Encounter for antineoplastic chemotherapy: Secondary | ICD-10-CM | POA: Diagnosis not present

## 2018-09-20 DIAGNOSIS — R030 Elevated blood-pressure reading, without diagnosis of hypertension: Secondary | ICD-10-CM

## 2018-09-20 LAB — CMP (CANCER CENTER ONLY)
ALT: 21 U/L (ref 0–44)
AST: 25 U/L (ref 15–41)
Albumin: 4.1 g/dL (ref 3.5–5.0)
Alkaline Phosphatase: 55 U/L (ref 38–126)
Anion gap: 8 (ref 5–15)
BUN: 23 mg/dL (ref 8–23)
CO2: 24 mmol/L (ref 22–32)
Calcium: 9.4 mg/dL (ref 8.9–10.3)
Chloride: 109 mmol/L (ref 98–111)
Creatinine: 0.72 mg/dL (ref 0.44–1.00)
GFR, Est AFR Am: >60 mL/min (ref >60)
GFR, Estimated: >60 mL/min (ref >60)
Glucose, Bld: 127 mg/dL — ABNORMAL HIGH (ref 70–99)
Potassium: 4 mmol/L (ref 3.5–5.1)
Sodium: 141 mmol/L (ref 135–145)
Total Bilirubin: 0.4 mg/dL (ref 0.3–1.2)
Total Protein: 7.5 g/dL (ref 6.5–8.1)

## 2018-09-20 LAB — CBC WITH DIFFERENTIAL (CANCER CENTER ONLY)
Abs Immature Granulocytes: 0.11 10*3/uL — ABNORMAL HIGH (ref 0.00–0.07)
Basophils Absolute: 0 10*3/uL (ref 0.0–0.1)
Basophils Relative: 0 %
Eosinophils Absolute: 0 10*3/uL (ref 0.0–0.5)
Eosinophils Relative: 0 %
HCT: 37.4 % (ref 36.0–46.0)
Hemoglobin: 12 g/dL (ref 12.0–15.0)
Immature Granulocytes: 1 %
Lymphocytes Relative: 11 %
Lymphs Abs: 1.1 10*3/uL (ref 0.7–4.0)
MCH: 28.8 pg (ref 26.0–34.0)
MCHC: 32.1 g/dL (ref 30.0–36.0)
MCV: 89.7 fL (ref 80.0–100.0)
Monocytes Absolute: 0.2 10*3/uL (ref 0.1–1.0)
Monocytes Relative: 2 %
Neutro Abs: 8.4 10*3/uL — ABNORMAL HIGH (ref 1.7–7.7)
Neutrophils Relative %: 86 %
Platelet Count: 164 10*3/uL (ref 150–400)
RBC: 4.17 MIL/uL (ref 3.87–5.11)
RDW: 15.7 % — ABNORMAL HIGH (ref 11.5–15.5)
WBC Count: 9.8 10*3/uL (ref 4.0–10.5)
nRBC: 0 % (ref 0.0–0.2)

## 2018-09-20 MED ORDER — SODIUM CHLORIDE 0.9 % IV SOLN
445.2000 mg | Freq: Once | INTRAVENOUS | Status: AC
  Administered 2018-09-20: 17:00:00 450 mg via INTRAVENOUS
  Filled 2018-09-20: qty 45

## 2018-09-20 MED ORDER — PALONOSETRON HCL INJECTION 0.25 MG/5ML
0.2500 mg | Freq: Once | INTRAVENOUS | Status: AC
  Administered 2018-09-20: 12:00:00 0.25 mg via INTRAVENOUS

## 2018-09-20 MED ORDER — DIPHENHYDRAMINE HCL 50 MG/ML IJ SOLN
INTRAMUSCULAR | Status: AC
  Filled 2018-09-20: qty 1

## 2018-09-20 MED ORDER — SODIUM CHLORIDE 0.9% FLUSH
10.0000 mL | INTRAVENOUS | Status: DC | PRN
  Administered 2018-09-20: 17:00:00 10 mL
  Filled 2018-09-20: qty 10

## 2018-09-20 MED ORDER — PALONOSETRON HCL INJECTION 0.25 MG/5ML
INTRAVENOUS | Status: AC
  Filled 2018-09-20: qty 5

## 2018-09-20 MED ORDER — DIPHENHYDRAMINE HCL 50 MG/ML IJ SOLN
50.0000 mg | Freq: Once | INTRAMUSCULAR | Status: AC
  Administered 2018-09-20: 12:00:00 50 mg via INTRAVENOUS

## 2018-09-20 MED ORDER — SODIUM CHLORIDE 0.9% FLUSH
10.0000 mL | Freq: Once | INTRAVENOUS | Status: AC
  Administered 2018-09-20: 10 mL
  Filled 2018-09-20: qty 10

## 2018-09-20 MED ORDER — HEPARIN SOD (PORK) LOCK FLUSH 100 UNIT/ML IV SOLN
500.0000 [IU] | Freq: Once | INTRAVENOUS | Status: AC | PRN
  Administered 2018-09-20: 17:00:00 500 [IU]
  Filled 2018-09-20: qty 5

## 2018-09-20 MED ORDER — SODIUM CHLORIDE 0.9 % IV SOLN
87.5000 mg/m2 | Freq: Once | INTRAVENOUS | Status: AC
  Administered 2018-09-20: 13:00:00 150 mg via INTRAVENOUS
  Filled 2018-09-20: qty 25

## 2018-09-20 MED ORDER — FAMOTIDINE IN NACL 20-0.9 MG/50ML-% IV SOLN
INTRAVENOUS | Status: AC
  Filled 2018-09-20: qty 50

## 2018-09-20 MED ORDER — FAMOTIDINE IN NACL 20-0.9 MG/50ML-% IV SOLN
20.0000 mg | Freq: Once | INTRAVENOUS | Status: AC
  Administered 2018-09-20: 12:00:00 20 mg via INTRAVENOUS

## 2018-09-20 MED ORDER — SODIUM CHLORIDE 0.9 % IV SOLN
Freq: Once | INTRAVENOUS | Status: AC
  Administered 2018-09-20: 12:00:00 via INTRAVENOUS
  Filled 2018-09-20: qty 5

## 2018-09-20 MED ORDER — SODIUM CHLORIDE 0.9 % IV SOLN
Freq: Once | INTRAVENOUS | Status: AC
  Administered 2018-09-20: 12:00:00 via INTRAVENOUS
  Filled 2018-09-20: qty 250

## 2018-09-20 NOTE — Assessment & Plan Note (Signed)
Overall, she tolerated reduced dose chemotherapy well Neuropathy is stable Her blood counts are satisfactory We will proceed with treatment without dose adjustment The plan is to complete 6 cycles of chemotherapy and then repeat imaging study next month.

## 2018-09-20 NOTE — Assessment & Plan Note (Signed)
Her blood pressure measurement at home is within normal range I suspect this is whitecoat hypertension Observe only for now

## 2018-09-20 NOTE — Patient Instructions (Signed)
[facility] Discharge Instructions for Patients Receiving Chemotherapy  Today you received the following chemotherapy agents: Taxol, Carboplatin  To help prevent nausea and vomiting after your treatment, we encourage you to take your nausea medication: As directed by MD   If you develop nausea and vomiting that is not controlled by your nausea medication, call the clinic.   BELOW ARE SYMPTOMS THAT SHOULD BE REPORTED IMMEDIATELY:  *FEVER GREATER THAN 100.5 F  *CHILLS WITH OR WITHOUT FEVER  NAUSEA AND VOMITING THAT IS NOT CONTROLLED WITH YOUR NAUSEA MEDICATION  *UNUSUAL SHORTNESS OF BREATH  *UNUSUAL BRUISING OR BLEEDING  TENDERNESS IN MOUTH AND THROAT WITH OR WITHOUT PRESENCE OF ULCERS  *URINARY PROBLEMS  *BOWEL PROBLEMS  UNUSUAL RASH Items with * indicate a potential emergency and should be followed up as soon as possible.  Feel free to call the clinic should you have any questions or concerns. The clinic phone number is [phone].  Please show the CHEMO ALERT CARD at check-in to the Emergency Department and triage nurse.   

## 2018-09-20 NOTE — Assessment & Plan Note (Signed)
Neuropathy is stable with recent dose adjustment.  We will continue similar dosage for future treatment

## 2018-09-20 NOTE — Progress Notes (Signed)
[facility] OFFICE PROGRESS NOTE  Patient Care Team: Allie Dimmer, MD as PCP - General (Internal Medicine)  ASSESSMENT & PLAN:  Fallopian tube cancer, carcinoma, right ([place]) Overall, she tolerated reduced dose chemotherapy well Neuropathy is stable Her blood counts are satisfactory We will proceed with treatment without dose adjustment The plan is to complete 6 cycles of chemotherapy and then repeat imaging study next month.  Elevated BP without diagnosis of hypertension Her blood pressure measurement at home is within normal range I suspect this is whitecoat hypertension Observe only for now  Peripheral neuropathy due to chemotherapy (HCC) Neuropathy is stable with recent dose adjustment.  We will continue similar dosage for future treatment   Orders Placed This Encounter  Procedures  . CT ABDOMEN PELVIS W CONTRAST    Standing Status:   Future    Standing Expiration Date:   09/20/2019    Order Specific Question:   If indicated for the ordered procedure, I authorize the administration of contrast media per Radiology protocol    Answer:   Yes    Order Specific Question:   Preferred imaging location?    Answer:   [facility]    Order Specific Question:   Radiology Contrast Protocol - do NOT remove file path    Answer:   [email or web address]    INTERVAL HISTORY: Please see below for problem oriented charting. She returns for further follow-up She is doing well She denies worsening neuropathy Appetite is stable No recent significant nausea or changes in bowel habits No recent infection fever or chills Her blood pressure monitoring at home is satisfactory  SUMMARY OF ONCOLOGIC HISTORY: Oncology History   High grade serous, right fallopian tube  HRD, BRCA tumor testing were neg     Ovarian cancer ([place])   03/21/2018 Initial Diagnosis    Ovarian cancer ([place])     Fallopian tube cancer, carcinoma, right ([place])   10/24/2017  Initial Diagnosis    She has presentation of vague abdominal pain    02/20/2018 Imaging    Outside CT abdomen and pelvis: This revealed an incidental finding of a 1.5 cm subcapsular lesion in the lateral upper pole of the left kidney which measured higher than fluid attenuation which could represent a proteinaceous renal cyst or solid renal mass.  There was no ascites.  There is no lymphadenopathy.  There were no masses in the pelvis including ovarian or adnexal masses seen.  There was however soft tissue stranding seen with nodularity in the omental fat in the lower abdomen without evidence of ascites.  This was felt to represent either carcinomatosis or inflammatory infectious etiologies.  There was colonic diverticulosis seen without radiographic evidence of diverticulitis.  The radiologist recommended MRI of the abdomen to further work-up the renal mass    02/22/2018 Tumor Marker    Patient's tumor was tested for the following markers: CA-125. Results of the tumor marker test revealed 103.    03/06/2018 Pathology Results    Omentum, biopsy - ADENOCARCINOMA WITH PSAMMOMA BODIES, SEE COMMENT. Microscopic Comment There are scattered small foci of malignant glands with associated psammoma bodies. While the tumor is somewhat limited the cells have a more low grade appearance. There is prominent lymphovascular invasion. Immunohistochemistry reveals the cells are positive for cytokeratin 7, ER, MOC31, PAX8, and WT-1. They are negative for calretinin, cytokeratin 20, CDX-2, and TTF-1. Overall, the findings are consistent with adenocarcinoma. The differential includes a primary gynecologic or peritoneal tumor, although the morphology is not typical  of a high grade serous carcinoma.    03/06/2018 Surgery    Surgeon: Donaciano Eva   Pre-operative Diagnosis: omental mass, elevated CA 125 Operation: Laparoscopic omentectomy  Surgeon: Donaciano Eva  Operative Findings:  : normal  diaphragm and upper omentum. Sigmoid colon densely adherent to left pelvic side wall consistent with history of diverticulitis. Omentum adherent to anterior abdominal wall and sigmoid colon. Surgically absent uterus. Right ovary very small and grossly normal. Left ovary obscured by adhesed sigmoid colon. No ascites. No carcinomatosis. There was a nodular firm distal omentum on the left which was adherent to the sigmoid colon and most consistent with inflammatory change.       03/21/2018 Pathology Results    1. Soft tissue mass, simple excision, midline abdominal wall mass - METASTATIC SEROUS CARCINOMA. 2. Omentum, resection for tumor - METASTATIC SEROUS CARCINOMA. 3. Adnexa - ovary +/- tube, neoplastic, right - RIGHT FALLOPIAN TUBE: -HIGH GRADE SEROUS CARCINOMA. -SEE ONCOLOGY TABLE. - RIGHT OVARY: SURFACE PSAMMOMA BODIES. NO DEFINITIVE MALIGNANT CELLS. -INCLUSION CYSTS. 4. Adnexa - ovary +/- tube, neoplastic, left - LEFT FALLOPIAN TUBE: LUMINAL AND SURFACE SEROUS CARCINOMA. - LEFT OVARY: FOCAL PSAMMOMA BODIES AND MALIGNANT CELLS. 5. Soft tissue mass, simple excision, left abdominal wall - METASTATIC SEROUS CARCINOMA. Microscopic Comment 3. OVARY or FALLOPIAN TUBE or PRIMARY PERITONEUM: Procedure: Bilateral salpingo-oophorectomy. Abdominal wall mass excision and omentectomy. Specimen Integrity: Intact. Tumor Site: Right fallopian tube. Ovarian Surface Involvement (required only if applicable): Present (left). Fallopian Tube Surface Involvement (required only if applicable): Present. Tumor Size: 1.5 cm. Histologic Type: High grade serous carcinoma. Histologic Grade: High grade. Implants (required for advanced stage serous/seromucinous borderline tumors only): Present. Other Tissue/ Organ Involvement: Omentum, abdominal wall, left fallopian tube and ovary. Largest Extrapelvic Peritoneal Focus (required only if applicable): >2 cm. Peritoneal/Ascitic Fluid: N/A. Treatment Effect  (required only for high-grade serous carcinomas): N/A. Regional Lymph Nodes: No lymph nodes submitted or found Pathologic Stage Classification (pTNM, AJCC 8th Edition): pT3c, pNX Representative Tumor Block: 3A Comment(s): None.    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   Surgeon: Thereasa Solo, MD. Specimens: Bilateral tubes / ovaries, omentum. Midline abdominal wall mass, left lateral abdominal wall mass.    Operative Findings:  Abdominal wall masses consistent with port site metastases at the umbilical incision (7cm) and left lateral incision (5cm). Omental caking in the infracolic omentum. Grossly normal very small tubes and ovaries. Normal diaphragms. No lymphadenopathy. Normal small and large intestine with the exception of miliary studding of tumor on the surface of the sigmoid colon and rectum in the cul de sac.    This represented an optimal cytoreduction (R1) with no gross visible disease remaining, however there was a thin rind of tumor on the lateral left fascia associated with the port site metastasis.      04/11/2018 Cancer Staging    Staging form: Ovary, Fallopian Tube, and Primary Peritoneal Carcinoma, AJCC 8th Edition - Pathologic: FIGO Stage IIIC (pT3, pN0, cM0) - Signed by [name], MD on 04/11/2018    05/23/2018 Imaging    1. Interval resolution of the anterior midline abdominal wall seroma. There is some persistent wispy soft tissue attenuation in the region of the midline incision, nonspecific and may simply reflect granulation tissue from surgery. 2. Stable 13 mm exophytic lesion posterior left kidney with attenuation higher  than expected for a simple cyst. This may be a cyst complicated by proteinaceous debris or hemorrhage, but attention on  follow-up recommended. 3. Stable mild persistent distention of proximal jejunal loops with gradual tapering to nondilated distal small bowel. 4. No overt peritoneal or mesenteric disease identified on this exam.    05/23/2018 Tumor Marker    Patient's tumor was tested for the following markers: CA-125 Results of the tumor marker test revealed 11.6    06/02/2018 - 09/20/2018 Chemotherapy    The patient had carboplatin and taxol    06/19/2018 Procedure    Status post right IJ port catheter placement. Catheter ready for use.    06/26/2018 Tumor Marker    Patient's tumor was tested for the following markers: CA-125. Results of the tumor marker test revealed 10.7     REVIEW OF SYSTEMS:   Constitutional: Denies fevers, chills or abnormal weight loss Eyes: Denies blurriness of vision Ears, nose, mouth, throat, and face: Denies mucositis or sore throat Respiratory: Denies cough, dyspnea or wheezes Cardiovascular: Denies palpitation, chest discomfort or lower extremity swelling Gastrointestinal:  Denies nausea, heartburn or change in bowel habits Skin: Denies abnormal skin rashes Lymphatics: Denies new lymphadenopathy or easy bruising Neurological:Denies numbness, tingling or new weaknesses Behavioral/Psych: Mood is stable, no new changes  All other systems were reviewed with the patient and are negative.  I have reviewed the past medical history, past surgical history, social history and family history with the patient and they are unchanged from previous note.  ALLERGIES:  is allergic to ivp dye [iodinated diagnostic agents]; propoxyphene; codeine; and ultram [tramadol hcl].  MEDICATIONS:  Current Outpatient Medications  Medication Sig Dispense Refill  . amLODipine (NORVASC) 5 MG tablet Take 5 mg by mouth daily.    . carboxymethylcellulose (REFRESH PLUS) 0.5 % SOLN Place 1 drop into both eyes 3 (three) times daily as needed (for dry eyes.).    Marland Kitchen Cholecalciferol (VITAMIN D3 SUPER STRENGTH)  50 MCG (2000 UT) TABS Take 2,000 Units by mouth daily.    . [employer] SENNA PLUS 8.6-50 MG tablet TAKE 2 TABLETS BY MOUTH AT BEDTIME *HOLD IF HAVING LOOSE STOOLS* 60 tablet 1  . DULoxetine (CYMBALTA) 20 MG capsule Take 1 capsule (20 mg total) by mouth daily. 30 capsule 3  . lidocaine-prilocaine (EMLA) cream Apply to affected area once 30 g 3  . ondansetron (ZOFRAN) 8 MG tablet Take 1 tablet (8 mg total) by mouth every 8 (eight) hours as needed for refractory nausea / vomiting. Start on day 3 after chemo. 30 tablet 1  . oxyCODONE 10 MG TABS Take 1 tablet (10 mg total) by mouth every 4 (four) hours as needed for severe pain. 60 tablet 0  . polyethylene glycol (MIRALAX) packet Take 17 g by mouth daily. 14 each 1  . prochlorperazine (COMPAZINE) 10 MG tablet Take 1 tablet (10 mg total) by mouth every 6 (six) hours as needed (Nausea or vomiting). 30 tablet 1  . traZODone (DESYREL) 50 MG tablet Take 50 mg by mouth at bedtime as needed for sleep.   0  . triamcinolone cream (KENALOG) 0.5 % Apply 1 application topically 2 (two) times daily. Apply to eyelids/eyebrows  4   No current facility-administered medications for this visit.     PHYSICAL EXAMINATION: ECOG PERFORMANCE STATUS: 1 - Symptomatic but completely ambulatory GENERAL:alert, no distress and comfortable SKIN: skin color, texture, turgor are normal, no rashes or significant lesions EYES: normal, Conjunctiva are pink and non-injected, sclera clear OROPHARYNX:no exudate, no  erythema and lips, buccal mucosa, and tongue normal  NECK: supple, thyroid normal size, non-tender, without nodularity LYMPH:  no palpable lymphadenopathy in the cervical, axillary or inguinal LUNGS: clear to auscultation and percussion with normal breathing effort HEART: regular rate & rhythm and no murmurs and no lower extremity edema ABDOMEN:abdomen soft, non-tender and normal bowel sounds Musculoskeletal:no cyanosis of digits and no clubbing  NEURO: alert & oriented x 3 with  fluent speech, no focal motor/sensory deficits  LABORATORY DATA:  I have reviewed the data as listed    Component Value Date/Time   NA 142 08/28/2018 0747   K 4.1 08/28/2018 0747   CL 109 08/28/2018 0747   CO2 19 (L) 08/28/2018 0747   GLUCOSE 130 (H) 08/28/2018 0747   BUN 15 08/28/2018 0747   CREATININE 0.74 08/28/2018 0747   CALCIUM 9.3 08/28/2018 0747   PROT 7.5 08/28/2018 0747   ALBUMIN 4.0 08/28/2018 0747   AST 21 08/28/2018 0747   ALT 22 08/28/2018 0747   ALKPHOS 56 08/28/2018 0747   BILITOT 0.2 (L) 08/28/2018 0747   GFRNONAA >60 08/28/2018 0747   GFRAA >60 08/28/2018 0747    No results found for: SPEP, UPEP  Lab Results  Component Value Date   WBC 9.8 09/20/2018   NEUTROABS 8.4 (H) 09/20/2018   HGB 12.0 09/20/2018   HCT 37.4 09/20/2018   MCV 89.7 09/20/2018   PLT 164 09/20/2018      Chemistry      Component Value Date/Time   NA 142 08/28/2018 0747   K 4.1 08/28/2018 0747   CL 109 08/28/2018 0747   CO2 19 (L) 08/28/2018 0747   BUN 15 08/28/2018 0747   CREATININE 0.74 08/28/2018 0747      Component Value Date/Time   CALCIUM 9.3 08/28/2018 0747   ALKPHOS 56 08/28/2018 0747   AST 21 08/28/2018 0747   ALT 22 08/28/2018 0747   BILITOT 0.2 (L) 08/28/2018 0747       All questions were answered. The patient knows to call the clinic with any problems, questions or concerns. No barriers to learning was detected.  I spent 15 minutes counseling the patient face to face. The total time spent in the appointment was 20 minutes and more than 50% was on counseling and review of test results  [name], MD 09/20/2018 10:51 AM

## 2018-09-21 ENCOUNTER — Telehealth: Payer: Self-pay | Admitting: Hematology and Oncology

## 2018-09-21 LAB — CA 125: Cancer Antigen (CA) 125: 9.1 U/mL (ref 0.0–38.1)

## 2018-09-21 NOTE — Telephone Encounter (Signed)
Called regarding schedule °

## 2018-09-26 ENCOUNTER — Other Ambulatory Visit: Payer: Self-pay | Admitting: Hematology and Oncology

## 2018-10-09 ENCOUNTER — Other Ambulatory Visit: Payer: Self-pay | Admitting: Gynecologic Oncology

## 2018-10-09 DIAGNOSIS — C562 Malignant neoplasm of left ovary: Secondary | ICD-10-CM

## 2018-10-09 MED ORDER — SENNOSIDES-DOCUSATE SODIUM 8.6-50 MG PO TABS: ORAL_TABLET | ORAL | 0 refills | Status: DC

## 2018-10-23 ENCOUNTER — Telehealth: Payer: Self-pay | Admitting: *Deleted

## 2018-10-23 ENCOUNTER — Ambulatory Visit (HOSPITAL_COMMUNITY)
Admission: RE | Admit: 2018-10-23 | Discharge: 2018-10-23 | Disposition: A | Discharge status: Home / Self Care | Payer: Medicare HMO | Source: Ambulatory Visit | Attending: Hematology and Oncology | Admitting: Hematology and Oncology

## 2018-10-23 ENCOUNTER — Encounter (HOSPITAL_COMMUNITY): Payer: Self-pay

## 2018-10-23 ENCOUNTER — Inpatient Hospital Stay: Payer: Medicare HMO

## 2018-10-23 ENCOUNTER — Inpatient Hospital Stay: Payer: Medicare HMO | Attending: Gynecologic Oncology

## 2018-10-23 ENCOUNTER — Other Ambulatory Visit: Payer: Self-pay

## 2018-10-23 DIAGNOSIS — C5701 Malignant neoplasm of right fallopian tube: Secondary | ICD-10-CM

## 2018-10-23 DIAGNOSIS — G620 Drug-induced polyneuropathy: Secondary | ICD-10-CM | POA: Insufficient documentation

## 2018-10-23 DIAGNOSIS — Z79899 Other long term (current) drug therapy: Secondary | ICD-10-CM | POA: Diagnosis not present

## 2018-10-23 DIAGNOSIS — R030 Elevated blood-pressure reading, without diagnosis of hypertension: Secondary | ICD-10-CM | POA: Diagnosis not present

## 2018-10-23 DIAGNOSIS — C561 Malignant neoplasm of right ovary: Secondary | ICD-10-CM | POA: Diagnosis present

## 2018-10-23 DIAGNOSIS — C569 Malignant neoplasm of unspecified ovary: Secondary | ICD-10-CM

## 2018-10-23 LAB — CBC WITH DIFFERENTIAL (CANCER CENTER ONLY)
Abs Immature Granulocytes: 0.03 10*3/uL (ref 0.00–0.07)
Basophils Absolute: 0 10*3/uL (ref 0.0–0.1)
Basophils Relative: 0 %
Eosinophils Absolute: 0.1 10*3/uL (ref 0.0–0.5)
Eosinophils Relative: 1 %
HCT: 37.7 % (ref 36.0–46.0)
Hemoglobin: 12 g/dL (ref 12.0–15.0)
Immature Granulocytes: 0 %
Lymphocytes Relative: 32 %
Lymphs Abs: 2.5 10*3/uL (ref 0.7–4.0)
MCH: 29.2 pg (ref 26.0–34.0)
MCHC: 31.8 g/dL (ref 30.0–36.0)
MCV: 91.7 fL (ref 80.0–100.0)
Monocytes Absolute: 1 10*3/uL (ref 0.1–1.0)
Monocytes Relative: 12 %
Neutro Abs: 4.3 10*3/uL (ref 1.7–7.7)
Neutrophils Relative %: 55 %
Platelet Count: 159 10*3/uL (ref 150–400)
RBC: 4.11 MIL/uL (ref 3.87–5.11)
RDW: 14.8 % (ref 11.5–15.5)
WBC Count: 7.8 10*3/uL (ref 4.0–10.5)
nRBC: 0 % (ref 0.0–0.2)

## 2018-10-23 LAB — CMP (CANCER CENTER ONLY)
ALT: 16 U/L (ref 0–44)
AST: 21 U/L (ref 15–41)
Albumin: 4 g/dL (ref 3.5–5.0)
Alkaline Phosphatase: 51 U/L (ref 38–126)
Anion gap: 9 (ref 5–15)
BUN: 16 mg/dL (ref 8–23)
CO2: 25 mmol/L (ref 22–32)
Calcium: 9.1 mg/dL (ref 8.9–10.3)
Chloride: 107 mmol/L (ref 98–111)
Creatinine: 0.71 mg/dL (ref 0.44–1.00)
GFR, Est AFR Am: >60 mL/min (ref >60)
GFR, Estimated: >60 mL/min (ref >60)
Glucose, Bld: 98 mg/dL (ref 70–99)
Potassium: 3.9 mmol/L (ref 3.5–5.1)
Sodium: 141 mmol/L (ref 135–145)
Total Bilirubin: 0.3 mg/dL (ref 0.3–1.2)
Total Protein: 7 g/dL (ref 6.5–8.1)

## 2018-10-23 MED ORDER — SODIUM CHLORIDE 0.9% FLUSH
10.0000 mL | Freq: Once | INTRAVENOUS | Status: AC
  Administered 2018-10-23: 10:00:00 10 mL
  Filled 2018-10-23: qty 10

## 2018-10-23 MED ORDER — HEPARIN SOD (PORK) LOCK FLUSH 100 UNIT/ML IV SOLN
INTRAVENOUS | Status: AC
  Filled 2018-10-23: qty 5

## 2018-10-23 MED ORDER — DIPHENHYDRAMINE HCL 50 MG PO TABS: ORAL_TABLET | ORAL | 0 refills | Status: DC

## 2018-10-23 MED ORDER — SODIUM CHLORIDE (PF) 0.9 % IJ SOLN
INTRAMUSCULAR | Status: AC
  Filled 2018-10-23: qty 50

## 2018-10-23 MED ORDER — PREDNISONE 50 MG PO TABS: ORAL_TABLET | ORAL | 0 refills | Status: DC

## 2018-10-23 MED ORDER — HEPARIN SOD (PORK) LOCK FLUSH 100 UNIT/ML IV SOLN
500.0000 [IU] | Freq: Once | INTRAVENOUS | Status: AC
  Administered 2018-10-23: 500 [IU] via INTRAVENOUS

## 2018-10-23 NOTE — Telephone Encounter (Signed)
I can see her at 1015 am on 7/2, 15 mins

## 2018-10-23 NOTE — Telephone Encounter (Signed)
Patient has to have scan rescheduled. She has a contrast allergy. It has been rescheduled for 7/31 at 3:30pm [facility]  Pre medication sent to her pharmacy. Patient advised of directions. She has 2 bottles of contrast to drink prior to the scan.

## 2018-10-24 ENCOUNTER — Ambulatory Visit: Payer: Medicare HMO | Admitting: Hematology and Oncology

## 2018-10-25 ENCOUNTER — Inpatient Hospital Stay: Payer: Medicare HMO | Attending: Gynecologic Oncology

## 2018-10-25 ENCOUNTER — Other Ambulatory Visit: Payer: Self-pay

## 2018-10-25 ENCOUNTER — Ambulatory Visit (HOSPITAL_COMMUNITY)
Admission: RE | Admit: 2018-10-25 | Discharge: 2018-10-25 | Disposition: A | Discharge status: Home / Self Care | Payer: Medicare HMO | Source: Ambulatory Visit | Attending: Hematology and Oncology | Admitting: Hematology and Oncology

## 2018-10-25 DIAGNOSIS — M545 Low back pain: Secondary | ICD-10-CM | POA: Diagnosis not present

## 2018-10-25 DIAGNOSIS — C5701 Malignant neoplasm of right fallopian tube: Secondary | ICD-10-CM | POA: Diagnosis present

## 2018-10-25 DIAGNOSIS — Z452 Encounter for adjustment and management of vascular access device: Secondary | ICD-10-CM | POA: Insufficient documentation

## 2018-10-25 DIAGNOSIS — R030 Elevated blood-pressure reading, without diagnosis of hypertension: Secondary | ICD-10-CM | POA: Diagnosis not present

## 2018-10-25 DIAGNOSIS — Z79899 Other long term (current) drug therapy: Secondary | ICD-10-CM | POA: Insufficient documentation

## 2018-10-25 DIAGNOSIS — C561 Malignant neoplasm of right ovary: Secondary | ICD-10-CM | POA: Insufficient documentation

## 2018-10-25 MED ORDER — HEPARIN SOD (PORK) LOCK FLUSH 100 UNIT/ML IV SOLN
500.0000 [IU] | Freq: Once | INTRAVENOUS | Status: AC
  Administered 2018-10-25: 16:00:00 500 [IU] via INTRAVENOUS

## 2018-10-25 MED ORDER — SODIUM CHLORIDE 0.9% FLUSH
10.0000 mL | Freq: Once | INTRAVENOUS | Status: AC
  Administered 2018-10-25: 10 mL
  Filled 2018-10-25: qty 10

## 2018-10-25 MED ORDER — HEPARIN SOD (PORK) LOCK FLUSH 100 UNIT/ML IV SOLN
INTRAVENOUS | Status: AC
  Filled 2018-10-25: qty 5

## 2018-10-25 MED ORDER — SODIUM CHLORIDE (PF) 0.9 % IJ SOLN
INTRAMUSCULAR | Status: AC
  Filled 2018-10-25: qty 50

## 2018-10-25 MED ORDER — IOHEXOL 300 MG/ML  SOLN
100.0000 mL | Freq: Once | INTRAMUSCULAR | Status: AC | PRN
  Administered 2018-10-25: 16:00:00 100 mL via INTRAVENOUS

## 2018-10-26 ENCOUNTER — Other Ambulatory Visit: Payer: Self-pay

## 2018-10-26 ENCOUNTER — Inpatient Hospital Stay (HOSPITAL_BASED_OUTPATIENT_CLINIC_OR_DEPARTMENT_OTHER): Payer: Medicare HMO | Admitting: Hematology and Oncology

## 2018-10-26 ENCOUNTER — Encounter: Payer: Self-pay | Admitting: Hematology and Oncology

## 2018-10-26 DIAGNOSIS — R030 Elevated blood-pressure reading, without diagnosis of hypertension: Secondary | ICD-10-CM | POA: Diagnosis not present

## 2018-10-26 DIAGNOSIS — C561 Malignant neoplasm of right ovary: Secondary | ICD-10-CM | POA: Diagnosis not present

## 2018-10-26 DIAGNOSIS — M545 Low back pain: Secondary | ICD-10-CM

## 2018-10-26 DIAGNOSIS — M5440 Lumbago with sciatica, unspecified side: Secondary | ICD-10-CM | POA: Insufficient documentation

## 2018-10-26 DIAGNOSIS — C5701 Malignant neoplasm of right fallopian tube: Secondary | ICD-10-CM | POA: Diagnosis not present

## 2018-10-26 DIAGNOSIS — Z79899 Other long term (current) drug therapy: Secondary | ICD-10-CM

## 2018-10-26 NOTE — Progress Notes (Signed)
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[facility]) I have reviewed her CT imaging in great detail with the patient The area of concern in her colon is indeterminate The patient has no symptoms She did have history of colon polyp and diverticulosis I recommend repeat colonoscopy with her gastroenterologist and we will try to facilitate her procedure to be done I recommend presenting her case at the next GYN oncology tumor board to review her imaging study in great detail My plan would be to continue close monitoring of signs and symptoms along with tumor marker I plan to repeat CT imaging in 3 months, around October  Elevated BP without diagnosis of hypertension Her blood pressure measurement at home is within normal range I suspect this is whitecoat hypertension Observe only for now  Acute back pain with sciatica She has recent symptoms of sciatica and lower back pain which I do not believe is related to her recent treatment Repeat imaging study of her back showed degenerative arthritis I recommend physical therapy as tolerated There is no contraindication for her to proceed with evaluation or joint injection of her back if indicated   No orders of the defined types were placed in this encounter.   INTERVAL HISTORY: Please see below for problem oriented charting. She returns for further follow-up Her husband, Eddie Dibbles, is available over the telephone as well Since last time I saw her, she is recovering well from side effects of treatment She has recent intermittent lower back pain with sciatica with radiation to her leg that comes and goes Otherwise, she denies changes in bowel habits, nausea or bloating Her appetite is good No recent infection, fever or chills  SUMMARY OF ONCOLOGIC HISTORY: Oncology History Overview Note  High grade  serous, right fallopian tube  HRD, BRCA tumor testing were neg   Ovarian cancer ([place])  03/21/2018 Initial Diagnosis   Ovarian cancer ([place])   Fallopian tube cancer, carcinoma, right ([place])  10/24/2017 Initial Diagnosis   She has presentation of vague abdominal pain   02/20/2018 Imaging   Outside CT abdomen and pelvis: This revealed an incidental finding of a 1.5 cm subcapsular lesion in the lateral upper pole of the left kidney which measured higher than fluid attenuation which could represent a proteinaceous renal cyst or solid renal mass.  There was no ascites.  There is no lymphadenopathy.  There were no masses in the pelvis including ovarian or adnexal masses seen.  There was however soft tissue stranding seen with nodularity in the omental fat in the lower abdomen without evidence of ascites.  This was felt to represent either carcinomatosis or inflammatory infectious etiologies.  There was colonic diverticulosis seen without radiographic evidence of diverticulitis.  The radiologist recommended MRI of the abdomen to further work-up the renal mass   02/22/2018 Tumor Marker   Patient's tumor was tested for the following markers: CA-125. Results of the tumor marker test revealed 103.   03/06/2018 Pathology Results   Omentum, biopsy - ADENOCARCINOMA WITH PSAMMOMA BODIES, SEE COMMENT. Microscopic Comment There are scattered small foci of malignant glands with associated psammoma bodies. While the tumor is somewhat limited the cells have a more low grade appearance. There is prominent lymphovascular invasion. Immunohistochemistry reveals the cells are positive for cytokeratin 7, ER, MOC31, PAX8, and WT-1. They are negative for calretinin, cytokeratin 20, CDX-2, and TTF-1. Overall, the findings  are consistent with adenocarcinoma. The differential includes a primary gynecologic or peritoneal tumor, although the morphology is not typical of a high grade serous carcinoma.   03/06/2018 Surgery    Surgeon: Donaciano Eva   Pre-operative Diagnosis: omental mass, elevated CA 125 Operation: Laparoscopic omentectomy  Surgeon: Donaciano Eva  Operative Findings:  : normal diaphragm and upper omentum. Sigmoid colon densely adherent to left pelvic side wall consistent with history of diverticulitis. Omentum adherent to anterior abdominal wall and sigmoid colon. Surgically absent uterus. Right ovary very small and grossly normal. Left ovary obscured by adhesed sigmoid colon. No ascites. No carcinomatosis. There was a nodular firm distal omentum on the left which was adherent to the sigmoid colon and most consistent with inflammatory change.      03/21/2018 Pathology Results   1. Soft tissue mass, simple excision, midline abdominal wall mass - METASTATIC SEROUS CARCINOMA. 2. Omentum, resection for tumor - METASTATIC SEROUS CARCINOMA. 3. Adnexa - ovary +/- tube, neoplastic, right - RIGHT FALLOPIAN TUBE: -HIGH GRADE SEROUS CARCINOMA. -SEE ONCOLOGY TABLE. - RIGHT OVARY: SURFACE PSAMMOMA BODIES. NO DEFINITIVE MALIGNANT CELLS. -INCLUSION CYSTS. 4. Adnexa - ovary +/- tube, neoplastic, left - LEFT FALLOPIAN TUBE: LUMINAL AND SURFACE SEROUS CARCINOMA. - LEFT OVARY: FOCAL PSAMMOMA BODIES AND MALIGNANT CELLS. 5. Soft tissue mass, simple excision, left abdominal wall - METASTATIC SEROUS CARCINOMA. Microscopic Comment 3. OVARY or FALLOPIAN TUBE or PRIMARY PERITONEUM: Procedure: Bilateral salpingo-oophorectomy. Abdominal wall mass excision and omentectomy. Specimen Integrity: Intact. Tumor Site: Right fallopian tube. Ovarian Surface Involvement (required only if applicable): Present (left). Fallopian Tube Surface Involvement (required only if applicable): Present. Tumor Size: 1.5 cm. Histologic Type: High grade serous carcinoma. Histologic Grade: High grade. Implants (required for advanced stage serous/seromucinous borderline tumors only): Present. Other Tissue/ Organ  Involvement: Omentum, abdominal wall, left fallopian tube and ovary. Largest Extrapelvic Peritoneal Focus (required only if applicable): >2 cm. Peritoneal/Ascitic Fluid: N/A. Treatment Effect (required only for high-grade serous carcinomas): N/A. Regional Lymph Nodes: No lymph nodes submitted or found Pathologic Stage Classification (pTNM, AJCC 8th Edition): pT3c, pNX Representative Tumor Block: 3A Comment(s): None.   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   Surgeon: Thereasa Solo, MD. Specimens: Bilateral tubes / ovaries, omentum. Midline abdominal wall mass, left lateral abdominal wall mass.    Operative Findings:  Abdominal wall masses consistent with port site metastases at the umbilical incision (7cm) and left lateral incision (5cm). Omental caking in the infracolic omentum. Grossly normal very small tubes and ovaries. Normal diaphragms. No lymphadenopathy. Normal small and large intestine with the exception of miliary studding of tumor on the surface of the sigmoid colon and rectum in the cul de sac.    This represented an optimal cytoreduction (R1) with no gross visible disease remaining, however there was a thin rind of tumor on the lateral left fascia associated with the port site metastasis.     04/11/2018 Cancer Staging   Staging form: Ovary, Fallopian Tube, and Primary Peritoneal Carcinoma, AJCC 8th Edition - Pathologic: FIGO Stage IIIC (pT3, pN0, cM0) - Signed by [name], MD on 04/11/2018   05/23/2018 Imaging   1. Interval resolution of the anterior midline abdominal wall seroma. There is some persistent wispy soft tissue attenuation in the region of the midline incision, nonspecific and may simply reflect granulation tissue from surgery. 2. Stable  13 mm exophytic lesion posterior left kidney with attenuation higher than expected for a simple cyst. This may be a cyst complicated by proteinaceous debris or hemorrhage, but attention on follow-up recommended. 3. Stable mild persistent distention of proximal jejunal loops with gradual tapering to nondilated distal small bowel. 4. No overt peritoneal or mesenteric disease identified on this exam.   05/23/2018 Tumor Marker   Patient's tumor was tested for the following markers: CA-125 Results of the tumor marker test revealed 11.6   06/02/2018 - 09/20/2018 Chemotherapy   The patient had carboplatin and taxol   06/19/2018 Procedure   Status post right IJ port catheter placement. Catheter ready for use.   06/26/2018 Tumor Marker   Patient's tumor was tested for the following markers: CA-125. Results of the tumor marker test revealed 10.7   09/20/2018 Tumor Marker   Patient's tumor was tested for the following markers: CA-125. Results of the tumor marker test revealed 9.1   10/25/2018 Imaging   1. There is suspicious, abnormal asymmetric increased soft tissue involving the cecum and ascending colon. This is suspicious for residual/progressive serosal involvement by tumor. 2. No additional sites of disease identified. No evidence for nodal metastasis, solid organ metastasis or ascites.       REVIEW OF SYSTEMS:   Constitutional: Denies fevers, chills or abnormal weight loss Eyes: Denies blurriness of vision Ears, nose, mouth, throat, and face: Denies mucositis or sore throat Respiratory: Denies cough, dyspnea or wheezes Cardiovascular: Denies palpitation, chest discomfort or lower extremity swelling Gastrointestinal:  Denies nausea, heartburn or change in bowel habits Skin: Denies abnormal skin rashes Lymphatics: Denies new lymphadenopathy or easy bruising Neurological:Denies numbness, tingling or new weaknesses Behavioral/Psych: Mood is stable, no new changes  All other systems were  reviewed with the patient and are negative.  I have reviewed the past medical history, past surgical history, social history and family history with the patient and they are unchanged from previous note.  ALLERGIES:  is allergic to ivp dye [iodinated diagnostic agents]; propoxyphene; codeine; and ultram [tramadol hcl].  MEDICATIONS:  Current Outpatient Medications  Medication Sig Dispense Refill  . amLODipine (NORVASC) 5 MG tablet Take 5 mg by mouth daily.    . carboxymethylcellulose (REFRESH PLUS) 0.5 % SOLN Place 1 drop into both eyes 3 (three) times daily as needed (for dry eyes.).    Marland Kitchen Cholecalciferol (VITAMIN D3 SUPER STRENGTH) 50 MCG (2000 UT) TABS Take 2,000 Units by mouth daily.    . diphenhydrAMINE (BENADRYL) 50 MG tablet Take 1 tablet 1 hour prior to scan 1 tablet 0  . DULoxetine (CYMBALTA) 20 MG capsule Take 1 capsule (20 mg total) by mouth daily. 30 capsule 3  . lidocaine-prilocaine (EMLA) cream Apply to affected area once 30 g 3  . ondansetron (ZOFRAN) 8 MG tablet Take 1 tablet (8 mg total) by mouth every 8 (eight) hours as needed for refractory nausea / vomiting. Start on day 3 after chemo. 30 tablet 1  . oxyCODONE 10 MG TABS Take 1 tablet (10 mg total) by mouth every 4 (four) hours as needed for severe pain. 60 tablet 0  . polyethylene glycol (MIRALAX) packet Take 17 g by mouth daily. 14 each 1  . predniSONE (DELTASONE) 50 MG tablet Take 1 tablet 13 hours prior to scan, 1 tab 7 hours prior, 1 tab 1 hour prior 3 tablet 0  . prochlorperazine (COMPAZINE) 10 MG tablet Take 1 tablet (10 mg total) by mouth every 6 (six) hours as needed (Nausea or vomiting).  30 tablet 1  . senna-docusate ([employer] SENNA PLUS) 8.6-50 MG tablet TAKE 1-2 TABLETS BY MOUTH AT BEDTIME *HOLD IF HAVING LOOSE STOOLS* 60 tablet 0  . traZODone (DESYREL) 50 MG tablet Take 50 mg by mouth at bedtime as needed for sleep.   0  . triamcinolone cream (KENALOG) 0.5 % Apply 1 application topically 2 (two) times daily. Apply to  eyelids/eyebrows  4   No current facility-administered medications for this visit.     PHYSICAL EXAMINATION: ECOG PERFORMANCE STATUS: 1 - Symptomatic but completely ambulatory  Vitals:   10/26/18 1026  BP: (!) 187/85  Pulse: 87  Resp: 20  Temp: 98.9 F (37.2 C)  SpO2: 99%   Filed Weights   10/26/18 1026  Weight: 154 lb 6.4 oz (70 kg)    GENERAL:alert, no distress and comfortable Musculoskeletal:no cyanosis of digits and no clubbing  NEURO: alert & oriented x 3 with fluent speech, no focal motor/sensory deficits  LABORATORY DATA:  I have reviewed the data as listed    Component Value Date/Time   NA 141 10/23/2018 0959   K 3.9 10/23/2018 0959   CL 107 10/23/2018 0959   CO2 25 10/23/2018 0959   GLUCOSE 98 10/23/2018 0959   BUN 16 10/23/2018 0959   CREATININE 0.71 10/23/2018 0959   CALCIUM 9.1 10/23/2018 0959   PROT 7.0 10/23/2018 0959   ALBUMIN 4.0 10/23/2018 0959   AST 21 10/23/2018 0959   ALT 16 10/23/2018 0959   ALKPHOS 51 10/23/2018 0959   BILITOT 0.3 10/23/2018 0959   GFRNONAA >60 10/23/2018 0959   GFRAA >60 10/23/2018 0959    No results found for: SPEP, UPEP  Lab Results  Component Value Date   WBC 7.8 10/23/2018   NEUTROABS 4.3 10/23/2018   HGB 12.0 10/23/2018   HCT 37.7 10/23/2018   MCV 91.7 10/23/2018   PLT 159 10/23/2018      Chemistry      Component Value Date/Time   NA 141 10/23/2018 0959   K 3.9 10/23/2018 0959   CL 107 10/23/2018 0959   CO2 25 10/23/2018 0959   BUN 16 10/23/2018 0959   CREATININE 0.71 10/23/2018 0959      Component Value Date/Time   CALCIUM 9.1 10/23/2018 0959   ALKPHOS 51 10/23/2018 0959   AST 21 10/23/2018 0959   ALT 16 10/23/2018 0959   BILITOT 0.3 10/23/2018 0959       RADIOGRAPHIC STUDIES: I have reviewed multiple imaging studies in great detail with the patient I have personally reviewed the radiological images as listed and agreed with the findings in the report. Ct Abdomen Pelvis W Contrast  Result  Date: 10/25/2018 CLINICAL DATA:  Restaging metastatic ovarian carcinoma. EXAM: CT ABDOMEN AND PELVIS WITH CONTRAST TECHNIQUE: Multidetector CT imaging of the abdomen and pelvis was performed using the standard protocol following bolus administration of intravenous contrast. CONTRAST:  127m OMNIPAQUE IOHEXOL 300 MG/ML  SOLN COMPARISON:  05/23/2018 FINDINGS: Lower chest: No acute abnormality. Hepatobiliary: No focal liver abnormality is seen. No gallstones, gallbladder wall thickening, or biliary dilatation. Pancreas: Unremarkable. No pancreatic ductal dilatation or surrounding inflammatory changes. Spleen: Normal in size without focal abnormality. Adrenals/Urinary Tract: The adrenal glands appear normal. The mildly dense exophytic cyst arising from the lateral cortex of the upper pole of the left kidney measures 1.4 cm and is unchanged from previous exam. No hydronephrosis identified bilaterally. Urinary bladder is normal. Stomach/Bowel: Stomach is within normal limits. No abnormal small bowel dilatation scratch set no abnormal bowel  dilatation, wall thickening or inflammation. Diffuse colonic diverticulosis without acute inflammation. Vascular/Lymphatic: Aortic atherosclerosis. No aneurysm. No retroperitoneal or mesenteric adenopathy. No pelvic or inguinal adenopathy. Reproductive: Status post hysterectomy.  No adnexal mass. Other: No ascites previous omentectomy. They were is eccentric increased soft tissue involving the wall of the cecum. Best seen on coronal image 34/4 representative area of increased mural soft tissue measures 1.7 x 3.7 cm, image 34/4 and is suspicious for serosal tumor. This asymmetric wall thickening extends along the ascending colon towards the hepatic flexure. Extends along the ascending colon to the level of the Musculoskeletal: Stable postoperative changes involving the ventral abdominal wall. No aggressive lytic or sclerotic bone lesion identified. IMPRESSION: 1. There is suspicious,  abnormal asymmetric increased soft tissue involving the cecum and ascending colon. This is suspicious for residual/progressive serosal involvement by tumor. 2. No additional sites of disease identified. No evidence for nodal metastasis, solid organ metastasis or ascites. Electronically Signed   By: Kerby Moors M.D.   On: 10/25/2018 16:44    All questions were answered. The patient knows to call the clinic with any problems, questions or concerns. No barriers to learning was detected.  I spent 30 minutes counseling the patient face to face. The total time spent in the appointment was 40 minutes and more than 50% was on counseling and review of test results  [name], MD 10/26/2018 11:47 AM

## 2018-10-26 NOTE — Assessment & Plan Note (Signed)
I have reviewed her CT imaging in great detail with the patient The area of concern in her colon is indeterminate The patient has no symptoms She did have history of colon polyp and diverticulosis I recommend repeat colonoscopy with her gastroenterologist and we will try to facilitate her procedure to be done I recommend presenting her case at the next GYN oncology tumor board to review her imaging study in great detail My plan would be to continue close monitoring of signs and symptoms along with tumor marker I plan to repeat CT imaging in 3 months, around October

## 2018-10-26 NOTE — Assessment & Plan Note (Signed)
Her blood pressure measurement at home is within normal range I suspect this is whitecoat hypertension Observe only for now

## 2018-10-26 NOTE — Assessment & Plan Note (Signed)
She has recent symptoms of sciatica and lower back pain which I do not believe is related to her recent treatment Repeat imaging study of her back showed degenerative arthritis I recommend physical therapy as tolerated There is no contraindication for her to proceed with evaluation or joint injection of her back if indicated

## 2018-10-30 ENCOUNTER — Telehealth: Payer: Self-pay | Admitting: *Deleted

## 2018-10-30 NOTE — Telephone Encounter (Signed)
Patient called and request that her last CT scan be faxed to Dr. Jennings Books. Scan was faxed. Patient also had a question regarding sweating/perspiring more. Message forwarded to [facility] APP

## 2018-10-31 ENCOUNTER — Telehealth: Payer: Self-pay | Admitting: Oncology

## 2018-10-31 NOTE — Telephone Encounter (Signed)
Left a message for Misty Hopkins to call back regarding increased sweating/perspiration.

## 2018-10-31 NOTE — Telephone Encounter (Signed)
Left a message for [facility] with response from Dr. Alvy Bimler. Also faxed office note to Dr. Audie Box at [phone].

## 2018-10-31 NOTE — Telephone Encounter (Signed)
Misty Hopkins called back and said she has an appointment to see Dr. Audie Box with GI tomorrow at 10 am.    She also said she has noticed that she has been sweating more since starting chemotherapy.  She said she can do a small amount of activity like watering her flowers outside and she starts to sweat.  She is wondering if this is from chemo and/or surgery.

## 2018-10-31 NOTE — Telephone Encounter (Signed)
The sweating is from recent chemo Please fax all info to Dr. Audie Box

## 2018-11-14 ENCOUNTER — Telehealth: Payer: Self-pay | Admitting: Oncology

## 2018-11-14 NOTE — Telephone Encounter (Signed)
Misty Hopkins called and asked if she needs to make a port flush appointment (last flush was 10/25/18).  Advised her that Dr. Alvy Bimler will call her after GYN Tumor Board on Monday to review her plan going forward.  Misty Hopkins verbalized agreement.  Also called Dr. Kathalene Frames office at [phone] and requested colonoscopy report.

## 2018-11-20 ENCOUNTER — Telehealth: Payer: Self-pay | Admitting: Hematology and Oncology

## 2018-11-20 ENCOUNTER — Telehealth: Payer: Self-pay | Admitting: Oncology

## 2018-11-20 ENCOUNTER — Other Ambulatory Visit: Payer: Self-pay | Admitting: Hematology and Oncology

## 2018-11-20 ENCOUNTER — Encounter: Payer: Self-pay | Admitting: Oncology

## 2018-11-20 DIAGNOSIS — C5701 Malignant neoplasm of right fallopian tube: Secondary | ICD-10-CM

## 2018-11-20 NOTE — Telephone Encounter (Signed)
Scheduled appt per 7/27 sch message - pt aware of appt date and time   

## 2018-11-20 NOTE — Progress Notes (Signed)
Gynecologic Oncology Multi-Disciplinary Disposition Conference Note  Date of the Conference: 11/20/2018  Patient Name: Misty Hopkins  Referring Provider: Dr. Helane Rima Primary GYN Oncologist: Dr. 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 Disposition is to repeat CT scan in 3 months.   This Multidisciplinary conference took place involving physicians from [place], [place], Radiation Oncology, Pathology, Radiology along with the Gynecologic Oncology Nurse Practitioner and RN.  Comprehensive assessment of the patient's malignancy, staging, need for surgery, chemotherapy, radiation therapy, and need for further testing were reviewed. Supportive measures, both inpatient and following discharge were also discussed. The recommended plan of care is documented. Greater than 35 minutes were spent correlating and coordinating this patient's care.

## 2018-11-20 NOTE — Telephone Encounter (Signed)
Called Misty Hopkins and advised her of Gyn Tumor Board recommendation of repeating a CT scan in a few months.  Also advised her that Dr. Alvy Bimler has placed an order to see her back in mid-August for labs and port flush and will discuss the recommendations further then.  She verbalized understanding and agreement and said she does need a copy of her last CT scan sent to her for her cancer policy.

## 2018-12-05 ENCOUNTER — Inpatient Hospital Stay: Payer: Medicare HMO | Attending: Gynecologic Oncology | Admitting: Hematology and Oncology

## 2018-12-05 ENCOUNTER — Other Ambulatory Visit: Payer: Self-pay

## 2018-12-05 ENCOUNTER — Inpatient Hospital Stay: Payer: Medicare HMO

## 2018-12-05 VITALS — BP 168/70 | HR 74 | Temp 97.8°F | Resp 18 | Ht 64.0 in | Wt 150.4 lb

## 2018-12-05 DIAGNOSIS — Z885 Allergy status to narcotic agent status: Secondary | ICD-10-CM | POA: Insufficient documentation

## 2018-12-05 DIAGNOSIS — C561 Malignant neoplasm of right ovary: Secondary | ICD-10-CM | POA: Insufficient documentation

## 2018-12-05 DIAGNOSIS — C5701 Malignant neoplasm of right fallopian tube: Secondary | ICD-10-CM

## 2018-12-05 DIAGNOSIS — S52602D Unspecified fracture of lower end of left ulna, subsequent encounter for closed fracture with routine healing: Secondary | ICD-10-CM

## 2018-12-05 DIAGNOSIS — Z79899 Other long term (current) drug therapy: Secondary | ICD-10-CM | POA: Insufficient documentation

## 2018-12-05 DIAGNOSIS — C569 Malignant neoplasm of unspecified ovary: Secondary | ICD-10-CM

## 2018-12-05 DIAGNOSIS — G620 Drug-induced polyneuropathy: Secondary | ICD-10-CM | POA: Insufficient documentation

## 2018-12-05 DIAGNOSIS — T451X5A Adverse effect of antineoplastic and immunosuppressive drugs, initial encounter: Secondary | ICD-10-CM | POA: Diagnosis not present

## 2018-12-05 DIAGNOSIS — Z9221 Personal history of antineoplastic chemotherapy: Secondary | ICD-10-CM | POA: Diagnosis not present

## 2018-12-05 LAB — CBC WITH DIFFERENTIAL (CANCER CENTER ONLY)
Abs Immature Granulocytes: 0.09 10*3/uL — ABNORMAL HIGH (ref 0.00–0.07)
Basophils Absolute: 0.1 10*3/uL (ref 0.0–0.1)
Basophils Relative: 1 %
Eosinophils Absolute: 0.3 10*3/uL (ref 0.0–0.5)
Eosinophils Relative: 3 %
HCT: 38.7 % (ref 36.0–46.0)
Hemoglobin: 12.6 g/dL (ref 12.0–15.0)
Immature Granulocytes: 1 %
Lymphocytes Relative: 34 %
Lymphs Abs: 3.5 10*3/uL (ref 0.7–4.0)
MCH: 29.6 pg (ref 26.0–34.0)
MCHC: 32.6 g/dL (ref 30.0–36.0)
MCV: 90.8 fL (ref 80.0–100.0)
Monocytes Absolute: 1.1 10*3/uL — ABNORMAL HIGH (ref 0.1–1.0)
Monocytes Relative: 11 %
Neutro Abs: 5.1 10*3/uL (ref 1.7–7.7)
Neutrophils Relative %: 50 %
Platelet Count: 190 10*3/uL (ref 150–400)
RBC: 4.26 MIL/uL (ref 3.87–5.11)
RDW: 13.3 % (ref 11.5–15.5)
WBC Count: 10.1 10*3/uL (ref 4.0–10.5)
nRBC: 0 % (ref 0.0–0.2)

## 2018-12-05 LAB — CMP (CANCER CENTER ONLY)
ALT: 17 U/L (ref 0–44)
AST: 19 U/L (ref 15–41)
Albumin: 4 g/dL (ref 3.5–5.0)
Alkaline Phosphatase: 50 U/L (ref 38–126)
Anion gap: 8 (ref 5–15)
BUN: 18 mg/dL (ref 8–23)
CO2: 24 mmol/L (ref 22–32)
Calcium: 9.2 mg/dL (ref 8.9–10.3)
Chloride: 109 mmol/L (ref 98–111)
Creatinine: 0.76 mg/dL (ref 0.44–1.00)
GFR, Est AFR Am: >60 mL/min (ref >60)
GFR, Estimated: >60 mL/min (ref >60)
Glucose, Bld: 99 mg/dL (ref 70–99)
Potassium: 4.1 mmol/L (ref 3.5–5.1)
Sodium: 141 mmol/L (ref 135–145)
Total Bilirubin: 0.5 mg/dL (ref 0.3–1.2)
Total Protein: 7 g/dL (ref 6.5–8.1)

## 2018-12-05 MED ORDER — SODIUM CHLORIDE 0.9% FLUSH
10.0000 mL | Freq: Once | INTRAVENOUS | Status: AC
  Administered 2018-12-05: 10 mL
  Filled 2018-12-05: qty 10

## 2018-12-05 MED ORDER — HEPARIN SOD (PORK) LOCK FLUSH 100 UNIT/ML IV SOLN
500.0000 [IU] | Freq: Once | INTRAVENOUS | Status: AC
  Administered 2018-12-05: 500 [IU]
  Filled 2018-12-05: qty 5

## 2018-12-06 ENCOUNTER — Other Ambulatory Visit: Payer: Self-pay

## 2018-12-06 ENCOUNTER — Telehealth: Payer: Self-pay

## 2018-12-06 ENCOUNTER — Encounter: Payer: Self-pay | Admitting: Hematology and Oncology

## 2018-12-06 ENCOUNTER — Telehealth: Payer: Self-pay | Admitting: Hematology and Oncology

## 2018-12-06 DIAGNOSIS — S52202A Unspecified fracture of shaft of left ulna, initial encounter for closed fracture: Secondary | ICD-10-CM | POA: Insufficient documentation

## 2018-12-06 LAB — CA 125: Cancer Antigen (CA) 125: 8.6 U/mL (ref 0.0–38.1)

## 2018-12-06 MED ORDER — PREDNISONE 50 MG PO TABS: ORAL_TABLET | ORAL | 0 refills | Status: DC

## 2018-12-06 NOTE — Telephone Encounter (Signed)
Spoke with pt by phone and gave instructions on CT dye allergy protocol for upcoming scan. Pt verbalizes understanding of taking prednisone and beneadryl prior to scan. Prescription for prednisone sent to her pharmacy. Also gave pt results of CA125 . No other needs at this time.

## 2018-12-06 NOTE — Progress Notes (Signed)
[place] OFFICE PROGRESS NOTE  Patient Care Team: Allie Dimmer, MD as PCP - General (Internal Medicine)  ASSESSMENT & PLAN:  Fallopian tube cancer, carcinoma, right ([place]) Clinically, she have no signs of cancer recurrence Tumor marker is stable Based on recent discussion at the GYN oncology tumor board, the plan would be to repeat imaging study, due October Due to history of contrast allergy, she will be premedicated I will see her back in 2 months for further follow-up and review test results  Peripheral neuropathy due to chemotherapy [facility]) She has minimum residual neuropathy from prior treatment Observe only  Left ulnar fracture She had recent accidental fall with injury She is doing well after surgery and currently being managed by orthopedic surgery She will continue conservative approach   Orders Placed This Encounter  Procedures  . CT ABDOMEN PELVIS W CONTRAST    Standing Status:   Future    Standing Expiration Date:   12/05/2019    Order Specific Question:   If indicated for the ordered procedure, I authorize the administration of contrast media per Radiology protocol    Answer:   Yes    Order Specific Question:   Preferred imaging location?    Answer:   [facility]    Order Specific Question:   Radiology Contrast Protocol - do NOT remove file path    Answer:   [email or web address]    INTERVAL HISTORY: Please see below for problem oriented charting. She returns for further follow-up She is doing well except she had accidental fall 2-1/2 weeks ago She underwent surgery and currently is doing well She follows closely with orthopedic surgery Denies abdominal bloating or recent changes in bowel habits Her prior neuropathy is stable No recent infection, fever or chills  SUMMARY OF ONCOLOGIC HISTORY: Oncology History Overview Note  High grade serous, right fallopian tube  HRD, BRCA tumor testing were neg    Ovarian cancer ([place])  03/21/2018 Initial Diagnosis   Ovarian cancer ([place])   Fallopian tube cancer, carcinoma, right ([place])  10/24/2017 Initial Diagnosis   She has presentation of vague abdominal pain   02/20/2018 Imaging   Outside CT abdomen and pelvis: This revealed an incidental finding of a 1.5 cm subcapsular lesion in the lateral upper pole of the left kidney which measured higher than fluid attenuation which could represent a proteinaceous renal cyst or solid renal mass.  There was no ascites.  There is no lymphadenopathy.  There were no masses in the pelvis including ovarian or adnexal masses seen.  There was however soft tissue stranding seen with nodularity in the omental fat in the lower abdomen without evidence of ascites.  This was felt to represent either carcinomatosis or inflammatory infectious etiologies.  There was colonic diverticulosis seen without radiographic evidence of diverticulitis.  The radiologist recommended MRI of the abdomen to further work-up the renal mass   02/22/2018 Tumor Marker   Patient's tumor was tested for the following markers: CA-125. Results of the tumor marker test revealed 103.   03/06/2018 Pathology Results   Omentum, biopsy - ADENOCARCINOMA WITH PSAMMOMA BODIES, SEE COMMENT. Microscopic Comment There are scattered small foci of malignant glands with associated psammoma bodies. While the tumor is somewhat limited the cells have a more low grade appearance. There is prominent lymphovascular invasion. Immunohistochemistry reveals the cells are positive for cytokeratin 7, ER, MOC31, PAX8, and WT-1. They are negative for calretinin, cytokeratin 20, CDX-2, and TTF-1. Overall, the findings are consistent with adenocarcinoma. The differential  includes a primary gynecologic or peritoneal tumor, although the morphology is not typical of a high grade serous carcinoma.   03/06/2018 Surgery   Surgeon: Donaciano Eva   Pre-operative Diagnosis: omental  mass, elevated CA 125 Operation: Laparoscopic omentectomy  Surgeon: Donaciano Eva  Operative Findings:  : normal diaphragm and upper omentum. Sigmoid colon densely adherent to left pelvic side wall consistent with history of diverticulitis. Omentum adherent to anterior abdominal wall and sigmoid colon. Surgically absent uterus. Right ovary very small and grossly normal. Left ovary obscured by adhesed sigmoid colon. No ascites. No carcinomatosis. There was a nodular firm distal omentum on the left which was adherent to the sigmoid colon and most consistent with inflammatory change.      03/21/2018 Pathology Results   1. Soft tissue mass, simple excision, midline abdominal wall mass - METASTATIC SEROUS CARCINOMA. 2. Omentum, resection for tumor - METASTATIC SEROUS CARCINOMA. 3. Adnexa - ovary +/- tube, neoplastic, right - RIGHT FALLOPIAN TUBE: -HIGH GRADE SEROUS CARCINOMA. -SEE ONCOLOGY TABLE. - RIGHT OVARY: SURFACE PSAMMOMA BODIES. NO DEFINITIVE MALIGNANT CELLS. -INCLUSION CYSTS. 4. Adnexa - ovary +/- tube, neoplastic, left - LEFT FALLOPIAN TUBE: LUMINAL AND SURFACE SEROUS CARCINOMA. - LEFT OVARY: FOCAL PSAMMOMA BODIES AND MALIGNANT CELLS. 5. Soft tissue mass, simple excision, left abdominal wall - METASTATIC SEROUS CARCINOMA. Microscopic Comment 3. OVARY or FALLOPIAN TUBE or PRIMARY PERITONEUM: Procedure: Bilateral salpingo-oophorectomy. Abdominal wall mass excision and omentectomy. Specimen Integrity: Intact. Tumor Site: Right fallopian tube. Ovarian Surface Involvement (required only if applicable): Present (left). Fallopian Tube Surface Involvement (required only if applicable): Present. Tumor Size: 1.5 cm. Histologic Type: High grade serous carcinoma. Histologic Grade: High grade. Implants (required for advanced stage serous/seromucinous borderline tumors only): Present. Other Tissue/ Organ Involvement: Omentum, abdominal wall, left fallopian tube and  ovary. Largest Extrapelvic Peritoneal Focus (required only if applicable): >2 cm. Peritoneal/Ascitic Fluid: N/A. Treatment Effect (required only for high-grade serous carcinomas): N/A. Regional Lymph Nodes: No lymph nodes submitted or found Pathologic Stage Classification (pTNM, AJCC 8th Edition): pT3c, pNX Representative Tumor Block: 3A Comment(s): None.   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   Surgeon: Thereasa Solo, MD. Specimens: Bilateral tubes / ovaries, omentum. Midline abdominal wall mass, left lateral abdominal wall mass.    Operative Findings:  Abdominal wall masses consistent with port site metastases at the umbilical incision (7cm) and left lateral incision (5cm). Omental caking in the infracolic omentum. Grossly normal very small tubes and ovaries. Normal diaphragms. No lymphadenopathy. Normal small and large intestine with the exception of miliary studding of tumor on the surface of the sigmoid colon and rectum in the cul de sac.    This represented an optimal cytoreduction (R1) with no gross visible disease remaining, however there was a thin rind of tumor on the lateral left fascia associated with the port site metastasis.     04/11/2018 Cancer Staging   Staging form: Ovary, Fallopian Tube, and Primary Peritoneal Carcinoma, AJCC 8th Edition - Pathologic: FIGO Stage IIIC (pT3, pN0, cM0) - Signed by [name], MD on 04/11/2018   05/23/2018 Imaging   1. Interval resolution of the anterior midline abdominal wall seroma. There is some persistent wispy soft tissue attenuation in the region of the midline incision, nonspecific and may simply reflect granulation tissue from surgery. 2. Stable 13 mm exophytic lesion posterior left kidney  with attenuation  higher than expected for a simple cyst. This may be a cyst complicated by proteinaceous debris or hemorrhage, but attention on follow-up recommended. 3. Stable mild persistent distention of proximal jejunal loops with gradual tapering to nondilated distal small bowel. 4. No overt peritoneal or mesenteric disease identified on this exam.   05/23/2018 Tumor Marker   Patient's tumor was tested for the following markers: CA-125 Results of the tumor marker test revealed 11.6   06/02/2018 - 09/20/2018 Chemotherapy   The patient had carboplatin and taxol   06/19/2018 Procedure   Status post right IJ port catheter placement. Catheter ready for use.   06/26/2018 Tumor Marker   Patient's tumor was tested for the following markers: CA-125. Results of the tumor marker test revealed 10.7   09/20/2018 Tumor Marker   Patient's tumor was tested for the following markers: CA-125. Results of the tumor marker test revealed 9.1   10/25/2018 Imaging   1. There is suspicious, abnormal asymmetric increased soft tissue involving the cecum and ascending colon. This is suspicious for residual/progressive serosal involvement by tumor. 2. No additional sites of disease identified. No evidence for nodal metastasis, solid organ metastasis or ascites.     11/07/2018 Procedure   She had negative colonoscopy evaluation   12/05/2018 Tumor Marker   Patient's tumor was tested for the following markers: CA-125 Results of the tumor marker test revealed 8.6     REVIEW OF SYSTEMS:   Constitutional: Denies fevers, chills or abnormal weight loss Eyes: Denies blurriness of vision Ears, nose, mouth, throat, and face: Denies mucositis or sore throat Respiratory: Denies cough, dyspnea or wheezes Cardiovascular: Denies palpitation, chest discomfort or lower extremity swelling Gastrointestinal:  Denies nausea, heartburn or change in bowel habits Skin: Denies abnormal skin rashes Lymphatics: Denies new lymphadenopathy or easy  bruising Neurological:Denies numbness, tingling or new weaknesses Behavioral/Psych: Mood is stable, no new changes  All other systems were reviewed with the patient and are negative.  I have reviewed the past medical history, past surgical history, social history and family history with the patient and they are unchanged from previous note.  ALLERGIES:  is allergic to ivp dye [iodinated diagnostic agents]; propoxyphene; codeine; and ultram [tramadol hcl].  MEDICATIONS:  Current Outpatient Medications  Medication Sig Dispense Refill  . amLODipine (NORVASC) 5 MG tablet Take 5 mg by mouth daily.    . carboxymethylcellulose (REFRESH PLUS) 0.5 % SOLN Place 1 drop into both eyes 3 (three) times daily as needed (for dry eyes.).    Marland Kitchen Cholecalciferol (VITAMIN D3 SUPER STRENGTH) 50 MCG (2000 UT) TABS Take 2,000 Units by mouth daily.    . diphenhydrAMINE (BENADRYL) 50 MG tablet Take 1 tablet 1 hour prior to scan 1 tablet 0  . DULoxetine (CYMBALTA) 20 MG capsule Take 1 capsule (20 mg total) by mouth daily. 30 capsule 3  . lidocaine-prilocaine (EMLA) cream Apply to affected area once 30 g 3  . ondansetron (ZOFRAN) 8 MG tablet Take 1 tablet (8 mg total) by mouth every 8 (eight) hours as needed for refractory nausea / vomiting. Start on day 3 after chemo. 30 tablet 1  . oxyCODONE 10 MG TABS Take 1 tablet (10 mg total) by mouth every 4 (four) hours as needed for severe pain. 60 tablet 0  . polyethylene glycol (MIRALAX) packet Take 17 g by mouth daily. 14 each 1  . predniSONE (DELTASONE) 50 MG tablet Take 1 tablet 13 hours prior to scan, 1 tab 7 hours prior, 1 tab 1  hour prior.  Also take OTC Benadryl 65m 1 hour prior to scan. 3 tablet 0  . prochlorperazine (COMPAZINE) 10 MG tablet Take 1 tablet (10 mg total) by mouth every 6 (six) hours as needed (Nausea or vomiting). 30 tablet 1  . senna-docusate ([employer] SENNA PLUS) 8.6-50 MG tablet TAKE 1-2 TABLETS BY MOUTH AT BEDTIME *HOLD IF HAVING LOOSE STOOLS* 60 tablet 0   . traZODone (DESYREL) 50 MG tablet Take 50 mg by mouth at bedtime as needed for sleep.   0  . triamcinolone cream (KENALOG) 0.5 % Apply 1 application topically 2 (two) times daily. Apply to eyelids/eyebrows  4   No current facility-administered medications for this visit.     PHYSICAL EXAMINATION: ECOG PERFORMANCE STATUS: 1 - Symptomatic but completely ambulatory  Vitals:   12/05/18 1255  BP: (!) 168/70  Pulse: 74  Resp: 18  Temp: 97.8 F (36.6 C)  SpO2: 100%   Filed Weights   12/05/18 1255  Weight: 150 lb 6.4 oz (68.2 kg)    GENERAL:alert, no distress and comfortable SKIN: skin color, texture, turgor are normal, no rashes or significant lesions EYES: normal, Conjunctiva are pink and non-injected, sclera clear OROPHARYNX:no exudate, no erythema and lips, buccal mucosa, and tongue normal  NECK: supple, thyroid normal size, non-tender, without nodularity LYMPH:  no palpable lymphadenopathy in the cervical, axillary or inguinal LUNGS: clear to auscultation and percussion with normal breathing effort HEART: regular rate & rhythm and no murmurs and no lower extremity edema ABDOMEN:abdomen soft, non-tender and normal bowel sounds Musculoskeletal:no cyanosis of digits and no clubbing  NEURO: alert & oriented x 3 with fluent speech, no focal motor/sensory deficits  LABORATORY DATA:  I have reviewed the data as listed    Component Value Date/Time   NA 141 12/05/2018 1232   K 4.1 12/05/2018 1232   CL 109 12/05/2018 1232   CO2 24 12/05/2018 1232   GLUCOSE 99 12/05/2018 1232   BUN 18 12/05/2018 1232   CREATININE 0.76 12/05/2018 1232   CALCIUM 9.2 12/05/2018 1232   PROT 7.0 12/05/2018 1232   ALBUMIN 4.0 12/05/2018 1232   AST 19 12/05/2018 1232   ALT 17 12/05/2018 1232   ALKPHOS 50 12/05/2018 1232   BILITOT 0.5 12/05/2018 1232   GFRNONAA >60 12/05/2018 1232   GFRAA >60 12/05/2018 1232    No results found for: SPEP, UPEP  Lab Results  Component Value Date   WBC 10.1  12/05/2018   NEUTROABS 5.1 12/05/2018   HGB 12.6 12/05/2018   HCT 38.7 12/05/2018   MCV 90.8 12/05/2018   PLT 190 12/05/2018      Chemistry      Component Value Date/Time   NA 141 12/05/2018 1232   K 4.1 12/05/2018 1232   CL 109 12/05/2018 1232   CO2 24 12/05/2018 1232   BUN 18 12/05/2018 1232   CREATININE 0.76 12/05/2018 1232      Component Value Date/Time   CALCIUM 9.2 12/05/2018 1232   ALKPHOS 50 12/05/2018 1232   AST 19 12/05/2018 1232   ALT 17 12/05/2018 1232   BILITOT 0.5 12/05/2018 1232     All questions were answered. The patient knows to call the clinic with any problems, questions or concerns. No barriers to learning was detected.  I spent 15 minutes counseling the patient face to face. The total time spent in the appointment was 20 minutes and more than 50% was on counseling and review of test results  NHeath Lark MD 12/06/2018 9:46 AM

## 2018-12-06 NOTE — Assessment & Plan Note (Signed)
Clinically, she have no signs of cancer recurrence Tumor marker is stable Based on recent discussion at the GYN oncology tumor board, the plan would be to repeat imaging study, due October Due to history of contrast allergy, she will be premedicated I will see her back in 2 months for further follow-up and review test results

## 2018-12-06 NOTE — Assessment & Plan Note (Signed)
She has minimum residual neuropathy from prior treatment Observe only

## 2018-12-06 NOTE — Telephone Encounter (Signed)
I talk with patient regarding schedule  

## 2018-12-06 NOTE — Telephone Encounter (Signed)
Spoke with pt by phone to confirm appts for 10/1 and 10/2.  Pt aware of dates, times

## 2018-12-06 NOTE — Telephone Encounter (Signed)
-----   Message from [name], MD sent at 12/06/2018  9:29 AM EDT ----- Regarding: RE: CT dye allergy protocol I almost forgot Please send prescription for prednisone. She can get benadryl OTC ----- Message ----- From: Paulla Dolly, RN Sent: 12/06/2018   9:25 AM EDT To: [name], MD Subject: CT dye allergy protocol                        Pt is allergic to CT dye. Protocol is for prednisone 50mg  at 13hrs, 7hrs and 1hr before CT and benadryl 50mg  1hr before CT. May I send this to pharmacy?

## 2018-12-06 NOTE — Assessment & Plan Note (Signed)
She had recent accidental fall with injury She is doing well after surgery and currently being managed by orthopedic surgery She will continue conservative approach

## 2018-12-07 ENCOUNTER — Telehealth: Payer: Self-pay | Admitting: Oncology

## 2018-12-07 NOTE — Telephone Encounter (Signed)
Misty Hopkins called and asked what the CA 125 measures and what her results mean.  Advised her that it is a tumor marker for ovarian cancer that is used to monitor cancer.  Also discussed that her level is trending down which is good.

## 2019-01-08 ENCOUNTER — Telehealth: Payer: Self-pay | Admitting: Oncology

## 2019-01-08 NOTE — Telephone Encounter (Signed)
Misty Hopkins called to confirm her appointments for lab/flush and CT on 01/25/19 and also review how and when she is supposed to take Prednisone and Benadryl.  Reviewed appointment times, prescription instructions and timing with her.   She also mentioned that she broke her left wrist in August and had to have surgery.  She now has a cast.  She said she was outside putting the hose away and stepped back and slipped.  She landed on her bottom and her left arm.

## 2019-01-25 ENCOUNTER — Other Ambulatory Visit: Payer: Self-pay

## 2019-01-25 ENCOUNTER — Inpatient Hospital Stay: Payer: Medicare HMO | Attending: Gynecologic Oncology

## 2019-01-25 ENCOUNTER — Ambulatory Visit (HOSPITAL_COMMUNITY)
Admission: RE | Admit: 2019-01-25 | Discharge: 2019-01-25 | Disposition: A | Discharge status: Home / Self Care | Payer: Medicare HMO | Source: Ambulatory Visit | Attending: Hematology and Oncology | Admitting: Hematology and Oncology

## 2019-01-25 ENCOUNTER — Inpatient Hospital Stay: Payer: Medicare HMO

## 2019-01-25 DIAGNOSIS — R030 Elevated blood-pressure reading, without diagnosis of hypertension: Secondary | ICD-10-CM | POA: Insufficient documentation

## 2019-01-25 DIAGNOSIS — N281 Cyst of kidney, acquired: Secondary | ICD-10-CM | POA: Diagnosis not present

## 2019-01-25 DIAGNOSIS — C5701 Malignant neoplasm of right fallopian tube: Secondary | ICD-10-CM | POA: Insufficient documentation

## 2019-01-25 DIAGNOSIS — K5730 Diverticulosis of large intestine without perforation or abscess without bleeding: Secondary | ICD-10-CM | POA: Insufficient documentation

## 2019-01-25 DIAGNOSIS — Z79899 Other long term (current) drug therapy: Secondary | ICD-10-CM | POA: Diagnosis not present

## 2019-01-25 DIAGNOSIS — Z885 Allergy status to narcotic agent status: Secondary | ICD-10-CM | POA: Diagnosis not present

## 2019-01-25 DIAGNOSIS — G620 Drug-induced polyneuropathy: Secondary | ICD-10-CM | POA: Diagnosis not present

## 2019-01-25 DIAGNOSIS — C561 Malignant neoplasm of right ovary: Secondary | ICD-10-CM | POA: Insufficient documentation

## 2019-01-25 DIAGNOSIS — C786 Secondary malignant neoplasm of retroperitoneum and peritoneum: Secondary | ICD-10-CM | POA: Diagnosis not present

## 2019-01-25 DIAGNOSIS — K5909 Other constipation: Secondary | ICD-10-CM | POA: Insufficient documentation

## 2019-01-25 DIAGNOSIS — Z23 Encounter for immunization: Secondary | ICD-10-CM | POA: Diagnosis not present

## 2019-01-25 DIAGNOSIS — C569 Malignant neoplasm of unspecified ovary: Secondary | ICD-10-CM

## 2019-01-25 LAB — CMP (CANCER CENTER ONLY)
ALT: 14 U/L (ref 0–44)
AST: 16 U/L (ref 15–41)
Albumin: 4.4 g/dL (ref 3.5–5.0)
Alkaline Phosphatase: 59 U/L (ref 38–126)
Anion gap: 11 (ref 5–15)
BUN: 16 mg/dL (ref 8–23)
CO2: 24 mmol/L (ref 22–32)
Calcium: 9.8 mg/dL (ref 8.9–10.3)
Chloride: 106 mmol/L (ref 98–111)
Creatinine: 0.76 mg/dL (ref 0.44–1.00)
GFR, Est AFR Am: >60 mL/min (ref >60)
GFR, Estimated: >60 mL/min (ref >60)
Glucose, Bld: 141 mg/dL — ABNORMAL HIGH (ref 70–99)
Potassium: 3.9 mmol/L (ref 3.5–5.1)
Sodium: 141 mmol/L (ref 135–145)
Total Bilirubin: 0.3 mg/dL (ref 0.3–1.2)
Total Protein: 7.5 g/dL (ref 6.5–8.1)

## 2019-01-25 LAB — CBC WITH DIFFERENTIAL (CANCER CENTER ONLY)
Abs Immature Granulocytes: 0.07 10*3/uL (ref 0.00–0.07)
Basophils Absolute: 0 10*3/uL (ref 0.0–0.1)
Basophils Relative: 0 %
Eosinophils Absolute: 0 10*3/uL (ref 0.0–0.5)
Eosinophils Relative: 0 %
HCT: 40.5 % (ref 36.0–46.0)
Hemoglobin: 13.2 g/dL (ref 12.0–15.0)
Immature Granulocytes: 1 %
Lymphocytes Relative: 8 %
Lymphs Abs: 1.1 10*3/uL (ref 0.7–4.0)
MCH: 28.8 pg (ref 26.0–34.0)
MCHC: 32.6 g/dL (ref 30.0–36.0)
MCV: 88.2 fL (ref 80.0–100.0)
Monocytes Absolute: 0.1 10*3/uL (ref 0.1–1.0)
Monocytes Relative: 1 %
Neutro Abs: 12.4 10*3/uL — ABNORMAL HIGH (ref 1.7–7.7)
Neutrophils Relative %: 90 %
Platelet Count: 199 10*3/uL (ref 150–400)
RBC: 4.59 MIL/uL (ref 3.87–5.11)
RDW: 13 % (ref 11.5–15.5)
WBC Count: 13.7 10*3/uL — ABNORMAL HIGH (ref 4.0–10.5)
nRBC: 0 % (ref 0.0–0.2)

## 2019-01-25 MED ORDER — SODIUM CHLORIDE 0.9% FLUSH
10.0000 mL | Freq: Once | INTRAVENOUS | Status: AC
  Administered 2019-01-25: 10 mL
  Filled 2019-01-25: qty 10

## 2019-01-25 MED ORDER — IOHEXOL 300 MG/ML  SOLN
100.0000 mL | Freq: Once | INTRAMUSCULAR | Status: AC | PRN
  Administered 2019-01-25: 100 mL via INTRAVENOUS

## 2019-01-25 MED ORDER — SODIUM CHLORIDE (PF) 0.9 % IJ SOLN
INTRAMUSCULAR | Status: AC
  Filled 2019-01-25: qty 50

## 2019-01-25 MED ORDER — HEPARIN SOD (PORK) LOCK FLUSH 100 UNIT/ML IV SOLN
500.0000 [IU] | Freq: Once | INTRAVENOUS | Status: AC
  Administered 2019-01-25: 500 [IU] via INTRAVENOUS

## 2019-01-25 MED ORDER — HEPARIN SOD (PORK) LOCK FLUSH 100 UNIT/ML IV SOLN
INTRAVENOUS | Status: AC
  Filled 2019-01-25: qty 5

## 2019-01-25 NOTE — Patient Instructions (Signed)

## 2019-01-26 ENCOUNTER — Encounter: Payer: Self-pay | Admitting: Hematology and Oncology

## 2019-01-26 ENCOUNTER — Other Ambulatory Visit: Payer: Self-pay

## 2019-01-26 ENCOUNTER — Inpatient Hospital Stay (HOSPITAL_BASED_OUTPATIENT_CLINIC_OR_DEPARTMENT_OTHER): Payer: Medicare HMO | Admitting: Hematology and Oncology

## 2019-01-26 VITALS — BP 146/70 | HR 76 | Temp 98.7°F | Resp 18 | Ht 64.0 in | Wt 154.8 lb

## 2019-01-26 DIAGNOSIS — K5909 Other constipation: Secondary | ICD-10-CM

## 2019-01-26 DIAGNOSIS — C5701 Malignant neoplasm of right fallopian tube: Secondary | ICD-10-CM

## 2019-01-26 DIAGNOSIS — Z23 Encounter for immunization: Secondary | ICD-10-CM | POA: Diagnosis not present

## 2019-01-26 DIAGNOSIS — R030 Elevated blood-pressure reading, without diagnosis of hypertension: Secondary | ICD-10-CM | POA: Diagnosis not present

## 2019-01-26 DIAGNOSIS — C569 Malignant neoplasm of unspecified ovary: Secondary | ICD-10-CM

## 2019-01-26 DIAGNOSIS — T451X5A Adverse effect of antineoplastic and immunosuppressive drugs, initial encounter: Secondary | ICD-10-CM

## 2019-01-26 DIAGNOSIS — G620 Drug-induced polyneuropathy: Secondary | ICD-10-CM

## 2019-01-26 LAB — CA 125: Cancer Antigen (CA) 125: 10.3 U/mL (ref 0.0–38.1)

## 2019-01-26 MED ORDER — INFLUENZA VAC A&B SA ADJ QUAD 0.5 ML IM PRSY
PREFILLED_SYRINGE | INTRAMUSCULAR | Status: AC
  Filled 2019-01-26: qty 0.5

## 2019-01-26 MED ORDER — INFLUENZA VAC A&B SA ADJ QUAD 0.5 ML IM PRSY
0.5000 mL | PREFILLED_SYRINGE | Freq: Once | INTRAMUSCULAR | Status: AC
  Administered 2019-01-26: 0.5 mL via INTRAMUSCULAR

## 2019-01-26 MED ORDER — LIDOCAINE-PRILOCAINE 2.5-2.5 % EX CREA: TOPICAL_CREAM | CUTANEOUS | 3 refills | Status: DC

## 2019-01-26 NOTE — Assessment & Plan Note (Signed)
Her constipation has resolved since discontinuation of chemotherapy We discussed conservative approach

## 2019-01-26 NOTE — Assessment & Plan Note (Signed)
Clinically, she have no signs of cancer recurrence Tumor marker is stable Repeat CT imaging show no evidence of disease Due to history of contrast allergy, she will be premedicated for future CT imaging I will see her back in 2 months for further follow-up and blood work along with physical examination We will reserve imaging study if she has elevated tumor markers or signs of cancer recurrence

## 2019-01-26 NOTE — Assessment & Plan Note (Signed)
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

## 2019-01-26 NOTE — Assessment & Plan Note (Signed)
She denies significant residual neuropathy Observe only for now

## 2019-01-26 NOTE — Progress Notes (Signed)
[place] OFFICE PROGRESS NOTE  Patient Care Team: Allie Dimmer, MD as PCP - General (Internal Medicine)  ASSESSMENT & PLAN:  Fallopian tube cancer, carcinoma, right ([place]) Clinically, she have no signs of cancer recurrence Tumor marker is stable Repeat CT imaging show no evidence of disease Due to history of contrast allergy, she will be premedicated for future CT imaging I will see her back in 2 months for further follow-up and blood work along with physical examination We will reserve imaging study if she has elevated tumor markers or signs of cancer recurrence  Elevated BP without diagnosis of hypertension Her blood pressure is much improved She likely has whitecoat hypertension I have discontinued amlodipine  Peripheral neuropathy due to chemotherapy [facility]) She denies significant residual neuropathy Observe only for now  Other constipation Her constipation has resolved since discontinuation of chemotherapy We discussed conservative approach   No orders of the defined types were placed in this encounter.   INTERVAL HISTORY: Please see below for problem oriented charting. She returns for further follow-up She feels well No residual neuropathy Denies recent changes in bowel habits No recent infection, fever or chills  SUMMARY OF ONCOLOGIC HISTORY: Oncology History Overview Note  High grade serous, right fallopian tube  HRD, BRCA tumor testing were neg   Ovarian cancer ([place])  03/21/2018 Initial Diagnosis   Ovarian cancer ([place])   Fallopian tube cancer, carcinoma, right ([place])  10/24/2017 Initial Diagnosis   She has presentation of vague abdominal pain   02/20/2018 Imaging   Outside CT abdomen and pelvis: This revealed an incidental finding of a 1.5 cm subcapsular lesion in the lateral upper pole of the left kidney which measured higher than fluid attenuation which could represent a proteinaceous renal cyst or solid renal mass.  There was no  ascites.  There is no lymphadenopathy.  There were no masses in the pelvis including ovarian or adnexal masses seen.  There was however soft tissue stranding seen with nodularity in the omental fat in the lower abdomen without evidence of ascites.  This was felt to represent either carcinomatosis or inflammatory infectious etiologies.  There was colonic diverticulosis seen without radiographic evidence of diverticulitis.  The radiologist recommended MRI of the abdomen to further work-up the renal mass   02/22/2018 Tumor Marker   Patient's tumor was tested for the following markers: CA-125. Results of the tumor marker test revealed 103.   03/06/2018 Pathology Results   Omentum, biopsy - ADENOCARCINOMA WITH PSAMMOMA BODIES, SEE COMMENT. Microscopic Comment There are scattered small foci of malignant glands with associated psammoma bodies. While the tumor is somewhat limited the cells have a more low grade appearance. There is prominent lymphovascular invasion. Immunohistochemistry reveals the cells are positive for cytokeratin 7, ER, MOC31, PAX8, and WT-1. They are negative for calretinin, cytokeratin 20, CDX-2, and TTF-1. Overall, the findings are consistent with adenocarcinoma. The differential includes a primary gynecologic or peritoneal tumor, although the morphology is not typical of a high grade serous carcinoma.   03/06/2018 Surgery   Surgeon: Donaciano Eva   Pre-operative Diagnosis: omental mass, elevated CA 125 Operation: Laparoscopic omentectomy  Surgeon: Donaciano Eva  Operative Findings:  : normal diaphragm and upper omentum. Sigmoid colon densely adherent to left pelvic side wall consistent with history of diverticulitis. Omentum adherent to anterior abdominal wall and sigmoid colon. Surgically absent uterus. Right ovary very small and grossly normal. Left ovary obscured by adhesed sigmoid colon. No ascites. No carcinomatosis. There was a nodular firm  distal  omentum on the left which was adherent to the sigmoid colon and most consistent with inflammatory change.      03/21/2018 Pathology Results   1. Soft tissue mass, simple excision, midline abdominal wall mass - METASTATIC SEROUS CARCINOMA. 2. Omentum, resection for tumor - METASTATIC SEROUS CARCINOMA. 3. Adnexa - ovary +/- tube, neoplastic, right - RIGHT FALLOPIAN TUBE: -HIGH GRADE SEROUS CARCINOMA. -SEE ONCOLOGY TABLE. - RIGHT OVARY: SURFACE PSAMMOMA BODIES. NO DEFINITIVE MALIGNANT CELLS. -INCLUSION CYSTS. 4. Adnexa - ovary +/- tube, neoplastic, left - LEFT FALLOPIAN TUBE: LUMINAL AND SURFACE SEROUS CARCINOMA. - LEFT OVARY: FOCAL PSAMMOMA BODIES AND MALIGNANT CELLS. 5. Soft tissue mass, simple excision, left abdominal wall - METASTATIC SEROUS CARCINOMA. Microscopic Comment 3. OVARY or FALLOPIAN TUBE or PRIMARY PERITONEUM: Procedure: Bilateral salpingo-oophorectomy. Abdominal wall mass excision and omentectomy. Specimen Integrity: Intact. Tumor Site: Right fallopian tube. Ovarian Surface Involvement (required only if applicable): Present (left). Fallopian Tube Surface Involvement (required only if applicable): Present. Tumor Size: 1.5 cm. Histologic Type: High grade serous carcinoma. Histologic Grade: High grade. Implants (required for advanced stage serous/seromucinous borderline tumors only): Present. Other Tissue/ Organ Involvement: Omentum, abdominal wall, left fallopian tube and ovary. Largest Extrapelvic Peritoneal Focus (required only if applicable): >2 cm. Peritoneal/Ascitic Fluid: N/A. Treatment Effect (required only for high-grade serous carcinomas): N/A. Regional Lymph Nodes: No lymph nodes submitted or found Pathologic Stage Classification (pTNM, AJCC 8th Edition): pT3c, pNX Representative Tumor Block: 3A Comment(s): None.   03/21/2018 Surgery   Preoperative Diagnosis: stage IIIC primary peritoneal vs ovarian cancer   Procedure(s) Performed:  Exploratory  laparotomy with bilateral salpingo-oophorectomy, omentectomy radical tumor debulking for ovarian cancer, ventral hernia repair.   Surgeon: Thereasa Solo, MD. Specimens: Bilateral tubes / ovaries, omentum. Midline abdominal wall mass, left lateral abdominal wall mass.    Operative Findings:  Abdominal wall masses consistent with port site metastases at the umbilical incision (7cm) and left lateral incision (5cm). Omental caking in the infracolic omentum. Grossly normal very small tubes and ovaries. Normal diaphragms. No lymphadenopathy. Normal small and large intestine with the exception of miliary studding of tumor on the surface of the sigmoid colon and rectum in the cul de sac.    This represented an optimal cytoreduction (R1) with no gross visible disease remaining, however there was a thin rind of tumor on the lateral left fascia associated with the port site metastasis.     04/11/2018 Cancer Staging   Staging form: Ovary, Fallopian Tube, and Primary Peritoneal Carcinoma, AJCC 8th Edition - Pathologic: FIGO Stage IIIC (pT3, pN0, cM0) - Signed by [name], MD on 04/11/2018   05/23/2018 Imaging   1. Interval resolution of the anterior midline abdominal wall seroma. There is some persistent wispy soft tissue attenuation in the region of the midline incision, nonspecific and may simply reflect granulation tissue from surgery. 2. Stable 13 mm exophytic lesion posterior left kidney with attenuation higher than expected for a simple cyst. This may be a cyst complicated by proteinaceous debris or hemorrhage, but attention on follow-up recommended. 3. Stable mild persistent distention of proximal jejunal loops with gradual tapering to nondilated distal small bowel. 4. No overt peritoneal or mesenteric disease identified on this exam.   05/23/2018 Tumor Marker   Patient's tumor was tested for the following markers: CA-125 Results of the tumor marker test revealed 11.6   06/02/2018 - 09/20/2018  Chemotherapy   The patient had carboplatin and taxol   06/19/2018 Procedure   Status post right IJ port catheter placement.  Catheter ready for use.   06/26/2018 Tumor Marker   Patient's tumor was tested for the following markers: CA-125. Results of the tumor marker test revealed 10.7   09/20/2018 Tumor Marker   Patient's tumor was tested for the following markers: CA-125. Results of the tumor marker test revealed 9.1   10/25/2018 Imaging   1. There is suspicious, abnormal asymmetric increased soft tissue involving the cecum and ascending colon. This is suspicious for residual/progressive serosal involvement by tumor. 2. No additional sites of disease identified. No evidence for nodal metastasis, solid organ metastasis or ascites.     11/07/2018 Procedure   She had negative colonoscopy evaluation   12/05/2018 Tumor Marker   Patient's tumor was tested for the following markers: CA-125 Results of the tumor marker test revealed 8.6   01/25/2019 Imaging   Ct abdomen and pelvis Signs of omentectomy and hysterectomy without definitive signs of residual recurrent disease. Small nodular focus bridging rectum and vagina is stable dating back January to 12/14/2018, potentially postoperative but suggest attention on subsequent imaging.   01/25/2019 Tumor Marker   Patient's tumor was tested for the following markers: CA-125 Results of the tumor marker test revealed 10.3     REVIEW OF SYSTEMS:   Constitutional: Denies fevers, chills or abnormal weight loss Eyes: Denies blurriness of vision Ears, nose, mouth, throat, and face: Denies mucositis or sore throat Respiratory: Denies cough, dyspnea or wheezes Cardiovascular: Denies palpitation, chest discomfort or lower extremity swelling Gastrointestinal:  Denies nausea, heartburn or change in bowel habits Skin: Denies abnormal skin rashes Lymphatics: Denies new lymphadenopathy or easy bruising Neurological:Denies numbness, tingling or new  weaknesses Behavioral/Psych: Mood is stable, no new changes  All other systems were reviewed with the patient and are negative.  I have reviewed the past medical history, past surgical history, social history and family history with the patient and they are unchanged from previous note.  ALLERGIES:  is allergic to ivp dye [iodinated diagnostic agents]; propoxyphene; codeine; and ultram [tramadol hcl].  MEDICATIONS:  Current Outpatient Medications  Medication Sig Dispense Refill  . carboxymethylcellulose (REFRESH PLUS) 0.5 % SOLN Place 1 drop into both eyes 3 (three) times daily as needed (for dry eyes.).    Marland Kitchen Cholecalciferol (VITAMIN D3 SUPER STRENGTH) 50 MCG (2000 UT) TABS Take 2,000 Units by mouth daily.    . diphenhydrAMINE (BENADRYL) 50 MG tablet Take 1 tablet 1 hour prior to scan 1 tablet 0  . DULoxetine (CYMBALTA) 20 MG capsule Take 1 capsule (20 mg total) by mouth daily. 30 capsule 3  . lidocaine-prilocaine (EMLA) cream Apply to affected area once 30 g 3  . polyethylene glycol (MIRALAX) packet Take 17 g by mouth daily. 14 each 1  . predniSONE (DELTASONE) 50 MG tablet Take 1 tablet 13 hours prior to scan, 1 tab 7 hours prior, 1 tab 1 hour prior.  Also take OTC Benadryl 35m 1 hour prior to scan. 3 tablet 0  . senna-docusate ([employer] SENNA PLUS) 8.6-50 MG tablet TAKE 1-2 TABLETS BY MOUTH AT BEDTIME *HOLD IF HAVING LOOSE STOOLS* 60 tablet 0  . traZODone (DESYREL) 50 MG tablet Take 50 mg by mouth at bedtime as needed for sleep.   0  . triamcinolone cream (KENALOG) 0.5 % Apply 1 application topically 2 (two) times daily. Apply to eyelids/eyebrows  4   No current facility-administered medications for this visit.     PHYSICAL EXAMINATION: ECOG PERFORMANCE STATUS: 0 - Asymptomatic  Vitals:   01/26/19 1154  BP: (!) 146/70  Pulse: 76  Resp: 18  Temp: 98.7 F (37.1 C)  SpO2: 97%   Filed Weights   01/26/19 1154  Weight: 154 lb 12.8 oz (70.2 kg)    GENERAL:alert, no distress and  comfortable Musculoskeletal:no cyanosis of digits and no clubbing  NEURO: alert & oriented x 3 with fluent speech, no focal motor/sensory deficits  LABORATORY DATA:  I have reviewed the data as listed    Component Value Date/Time   NA 141 01/25/2019 0909   K 3.9 01/25/2019 0909   CL 106 01/25/2019 0909   CO2 24 01/25/2019 0909   GLUCOSE 141 (H) 01/25/2019 0909   BUN 16 01/25/2019 0909   CREATININE 0.76 01/25/2019 0909   CALCIUM 9.8 01/25/2019 0909   PROT 7.5 01/25/2019 0909   ALBUMIN 4.4 01/25/2019 0909   AST 16 01/25/2019 0909   ALT 14 01/25/2019 0909   ALKPHOS 59 01/25/2019 0909   BILITOT 0.3 01/25/2019 0909   GFRNONAA >60 01/25/2019 0909   GFRAA >60 01/25/2019 0909    No results found for: SPEP, UPEP  Lab Results  Component Value Date   WBC 13.7 (H) 01/25/2019   NEUTROABS 12.4 (H) 01/25/2019   HGB 13.2 01/25/2019   HCT 40.5 01/25/2019   MCV 88.2 01/25/2019   PLT 199 01/25/2019      Chemistry      Component Value Date/Time   NA 141 01/25/2019 0909   K 3.9 01/25/2019 0909   CL 106 01/25/2019 0909   CO2 24 01/25/2019 0909   BUN 16 01/25/2019 0909   CREATININE 0.76 01/25/2019 0909      Component Value Date/Time   CALCIUM 9.8 01/25/2019 0909   ALKPHOS 59 01/25/2019 0909   AST 16 01/25/2019 0909   ALT 14 01/25/2019 0909   BILITOT 0.3 01/25/2019 0909       RADIOGRAPHIC STUDIES: I have reviewed multiple imaging studies with the patient I have personally reviewed the radiological images as listed and agreed with the findings in the report. Ct Abdomen Pelvis W Contrast  Result Date: 01/25/2019 CLINICAL DATA:  Fallopian tube primary with peritoneal carcinomatosis, assess treatment response. Clinical response to therapy with stable tumor markers as of August of 2020 most recent chemotherapy noted in the chart from May of 2020. EXAM: CT ABDOMEN AND PELVIS WITH CONTRAST TECHNIQUE: Multidetector CT imaging of the abdomen and pelvis was performed using the standard  protocol following bolus administration of intravenous contrast. CONTRAST:  111m OMNIPAQUE IOHEXOL 300 MG/ML  SOLN COMPARISON:  10/25/2018 FINDINGS: Lower chest: Minimal scarring of right middle lobe appear as of effusion. No dense consolidation or suspicious nodule at the lung bases. Hepatobiliary: Liver without focal lesion. Normal appearance of gallbladder and biliary tree. Pancreas: Mild fatty replacement of pancreas, no focal pancreatic lesion or ductal dilation. Spleen: Normal in size without focal abnormality. Adrenals/Urinary Tract: Normal appearance of adrenal glands. Symmetric enhancement of the bilateral kidneys with small renal cysts on the left. Stomach/Bowel: No signs of acute gastrointestinal process. Colonic diverticulosis. Possible colonic thickening no longer evident but there is improved colonic distention which may explain the appearance on the prior exam. Vascular/Lymphatic: Scattered atherosclerosis. No signs of aneurysm in the abdomen. No signs of retroperitoneal or upper abdominal lymphadenopathy. No signs of pelvic lymphadenopathy. Reproductive: Post hysterectomy. Descent of bladder base towards pelvic floor with bowing of levator in on musculature. Subtle bridging of vagina and mid rectum with subtle calcification best seen on image 76 of series 2 measuring approximately 7 x 6 mm, not  changed in retrospect dating back to January of 2020 Other: Post omentectomy. No signs of suspicious peritoneal nodule or mesenteric mass. Midline soft tissue thickening associated with surgical changes in the ventral abdominal wall likely granulation tissue unchanged. Musculoskeletal: No acute bone finding or evidence of destructive bone process. IMPRESSION: Signs of omentectomy and hysterectomy without definitive signs of residual recurrent disease. Small nodular focus bridging rectum and vagina is stable dating back January to 12/14/2018, potentially postoperative but suggest attention on subsequent  imaging. Electronically Signed   By: Zetta Bills M.D.   On: 01/25/2019 12:55    All questions were answered. The patient knows to call the clinic with any problems, questions or concerns. No barriers to learning was detected.  I spent 15 minutes counseling the patient face to face. The total time spent in the appointment was 20 minutes and more than 50% was on counseling and review of test results  [name], MD 01/26/2019 1:39 PM

## 2019-01-29 ENCOUNTER — Telehealth: Payer: Self-pay | Admitting: Hematology and Oncology

## 2019-01-29 NOTE — Telephone Encounter (Signed)
I talk with patient regarding schedule  

## 2019-02-01 ENCOUNTER — Telehealth: Payer: Self-pay | Admitting: *Deleted

## 2019-02-01 NOTE — Telephone Encounter (Signed)
[place] faxed Prior authorization request for Lidocaine-Prilocaine Cream.  Request to [employer] letter tray receptacle of Prior Authorization requests and forms for review.

## 2019-03-01 ENCOUNTER — Telehealth: Payer: Self-pay | Admitting: Oncology

## 2019-03-01 NOTE — Telephone Encounter (Signed)
Misty Hopkins called and said she has had gas and bloating since yesterday.  She took Tums yesterday and started taking gax-x today and said she hasn't eaten anything out of the ordinary that would cause gas.  Advised her that I will call and check on her tomorrow and that Dr. Alvy Bimler will be notified.

## 2019-03-02 NOTE — Telephone Encounter (Signed)
Misty Hopkins with message below from Dr. Alvy Bimler.  She verbalized agreement and said she is feeling good today.  She said she does have trouble with constipation and takes senna plus.  Her last BM was yesterday.  Advised her to call next week if she continues to have the gas/bloating.

## 2019-03-02 NOTE — Telephone Encounter (Signed)
Make sure she is not constipated If just happened I won't worry about it Call back next week if worse or not better and we can potential move her appt sooner

## 2019-03-15 ENCOUNTER — Other Ambulatory Visit: Payer: Self-pay

## 2019-03-15 ENCOUNTER — Inpatient Hospital Stay: Payer: Medicare HMO

## 2019-03-15 ENCOUNTER — Inpatient Hospital Stay: Payer: Medicare HMO | Attending: Gynecologic Oncology | Admitting: Hematology and Oncology

## 2019-03-15 ENCOUNTER — Encounter: Payer: Self-pay | Admitting: Hematology and Oncology

## 2019-03-15 DIAGNOSIS — C5701 Malignant neoplasm of right fallopian tube: Secondary | ICD-10-CM | POA: Insufficient documentation

## 2019-03-15 DIAGNOSIS — R030 Elevated blood-pressure reading, without diagnosis of hypertension: Secondary | ICD-10-CM | POA: Insufficient documentation

## 2019-03-15 DIAGNOSIS — Z79899 Other long term (current) drug therapy: Secondary | ICD-10-CM | POA: Insufficient documentation

## 2019-03-15 DIAGNOSIS — C569 Malignant neoplasm of unspecified ovary: Secondary | ICD-10-CM

## 2019-03-15 DIAGNOSIS — K5730 Diverticulosis of large intestine without perforation or abscess without bleeding: Secondary | ICD-10-CM | POA: Diagnosis not present

## 2019-03-15 DIAGNOSIS — Z885 Allergy status to narcotic agent status: Secondary | ICD-10-CM | POA: Diagnosis not present

## 2019-03-15 DIAGNOSIS — C561 Malignant neoplasm of right ovary: Secondary | ICD-10-CM | POA: Insufficient documentation

## 2019-03-15 LAB — CBC WITH DIFFERENTIAL (CANCER CENTER ONLY)
Abs Immature Granulocytes: 0.02 10*3/uL (ref 0.00–0.07)
Basophils Absolute: 0.1 10*3/uL (ref 0.0–0.1)
Basophils Relative: 1 %
Eosinophils Absolute: 0.3 10*3/uL (ref 0.0–0.5)
Eosinophils Relative: 3 %
HCT: 39.3 % (ref 36.0–46.0)
Hemoglobin: 12.6 g/dL (ref 12.0–15.0)
Immature Granulocytes: 0 %
Lymphocytes Relative: 31 %
Lymphs Abs: 2.9 10*3/uL (ref 0.7–4.0)
MCH: 28.4 pg (ref 26.0–34.0)
MCHC: 32.1 g/dL (ref 30.0–36.0)
MCV: 88.5 fL (ref 80.0–100.0)
Monocytes Absolute: 0.9 10*3/uL (ref 0.1–1.0)
Monocytes Relative: 10 %
Neutro Abs: 5.1 10*3/uL (ref 1.7–7.7)
Neutrophils Relative %: 55 %
Platelet Count: 185 10*3/uL (ref 150–400)
RBC: 4.44 MIL/uL (ref 3.87–5.11)
RDW: 13.5 % (ref 11.5–15.5)
WBC Count: 9.3 10*3/uL (ref 4.0–10.5)
nRBC: 0 % (ref 0.0–0.2)

## 2019-03-15 LAB — CMP (CANCER CENTER ONLY)
ALT: 16 U/L (ref 0–44)
AST: 19 U/L (ref 15–41)
Albumin: 3.9 g/dL (ref 3.5–5.0)
Alkaline Phosphatase: 55 U/L (ref 38–126)
Anion gap: 10 (ref 5–15)
BUN: 20 mg/dL (ref 8–23)
CO2: 24 mmol/L (ref 22–32)
Calcium: 9 mg/dL (ref 8.9–10.3)
Chloride: 108 mmol/L (ref 98–111)
Creatinine: 0.72 mg/dL (ref 0.44–1.00)
GFR, Est AFR Am: >60 mL/min (ref >60)
GFR, Estimated: >60 mL/min (ref >60)
Glucose, Bld: 97 mg/dL (ref 70–99)
Potassium: 3.7 mmol/L (ref 3.5–5.1)
Sodium: 142 mmol/L (ref 135–145)
Total Bilirubin: 0.3 mg/dL (ref 0.3–1.2)
Total Protein: 6.8 g/dL (ref 6.5–8.1)

## 2019-03-15 MED ORDER — SODIUM CHLORIDE 0.9% FLUSH
10.0000 mL | Freq: Once | INTRAVENOUS | Status: AC
  Administered 2019-03-15: 10 mL
  Filled 2019-03-15: qty 10

## 2019-03-15 MED ORDER — HEPARIN SOD (PORK) LOCK FLUSH 100 UNIT/ML IV SOLN
500.0000 [IU] | Freq: Once | INTRAVENOUS | Status: AC
  Administered 2019-03-15: 500 [IU]
  Filled 2019-03-15: qty 5

## 2019-03-15 NOTE — Assessment & Plan Note (Signed)
Her blood pressure is intermittently elevated She likely has whitecoat hypertension We will monitor her blood pressure closely and resume blood pressure medication in the future if her blood pressure is consistently elevated

## 2019-03-15 NOTE — Progress Notes (Signed)
[facility] OFFICE PROGRESS NOTE  Patient Care Team: Allie Dimmer, MD as PCP - General (Internal Medicine)  ASSESSMENT & PLAN:  Fallopian tube cancer, carcinoma, right ([place]) Clinically, she have no signs of cancer recurrence Tumor marker is pending Her last CT imaging in October 2020 showed no evidence of disease Due to history of contrast allergy, she will be premedicated for future CT imaging I will see her back in 2 months for further follow-up and blood work along with physical examination We will reserve to repeat imaging study only if she has elevated tumor markers or signs of cancer recurrence  Elevated BP without diagnosis of hypertension Her blood pressure is intermittently elevated She likely has whitecoat hypertension We will monitor her blood pressure closely and resume blood pressure medication in the future if her blood pressure is consistently elevated   No orders of the defined types were placed in this encounter.   INTERVAL HISTORY: Please see below for problem oriented charting. She returns for further follow-up She feels well Denies abdominal bloating No recent changes in bowel habits She has occasional abdominal discomfort along the incision site that comes and goes Her appetite is stable  SUMMARY OF ONCOLOGIC HISTORY: Oncology History Overview Note  High grade serous, right fallopian tube  HRD, BRCA tumor testing were neg   Ovarian cancer ([place])  03/21/2018 Initial Diagnosis   Ovarian cancer ([place])   Fallopian tube cancer, carcinoma, right ([place])  10/24/2017 Initial Diagnosis   She has presentation of vague abdominal pain   02/20/2018 Imaging   Outside CT abdomen and pelvis: This revealed an incidental finding of a 1.5 cm subcapsular lesion in the lateral upper pole of the left kidney which measured higher than fluid attenuation which could represent a proteinaceous renal cyst or solid renal mass.  There was no ascites.  There is no  lymphadenopathy.  There were no masses in the pelvis including ovarian or adnexal masses seen.  There was however soft tissue stranding seen with nodularity in the omental fat in the lower abdomen without evidence of ascites.  This was felt to represent either carcinomatosis or inflammatory infectious etiologies.  There was colonic diverticulosis seen without radiographic evidence of diverticulitis.  The radiologist recommended MRI of the abdomen to further work-up the renal mass   02/22/2018 Tumor Marker   Patient's tumor was tested for the following markers: CA-125. Results of the tumor marker test revealed 103.   03/06/2018 Pathology Results   Omentum, biopsy - ADENOCARCINOMA WITH PSAMMOMA BODIES, SEE COMMENT. Microscopic Comment There are scattered small foci of malignant glands with associated psammoma bodies. While the tumor is somewhat limited the cells have a more low grade appearance. There is prominent lymphovascular invasion. Immunohistochemistry reveals the cells are positive for cytokeratin 7, ER, MOC31, PAX8, and WT-1. They are negative for calretinin, cytokeratin 20, CDX-2, and TTF-1. Overall, the findings are consistent with adenocarcinoma. The differential includes a primary gynecologic or peritoneal tumor, although the morphology is not typical of a high grade serous carcinoma.   03/06/2018 Surgery   Surgeon: Donaciano Eva   Pre-operative Diagnosis: omental mass, elevated CA 125 Operation: Laparoscopic omentectomy  Surgeon: Donaciano Eva  Operative Findings:  : normal diaphragm and upper omentum. Sigmoid colon densely adherent to left pelvic side wall consistent with history of diverticulitis. Omentum adherent to anterior abdominal wall and sigmoid colon. Surgically absent uterus. Right ovary very small and grossly normal. Left ovary obscured by adhesed sigmoid colon. No ascites. No carcinomatosis.  There was a nodular firm distal omentum on the left which  was adherent to the sigmoid colon and most consistent with inflammatory change.      03/21/2018 Pathology Results   1. Soft tissue mass, simple excision, midline abdominal wall mass - METASTATIC SEROUS CARCINOMA. 2. Omentum, resection for tumor - METASTATIC SEROUS CARCINOMA. 3. Adnexa - ovary +/- tube, neoplastic, right - RIGHT FALLOPIAN TUBE: -HIGH GRADE SEROUS CARCINOMA. -SEE ONCOLOGY TABLE. - RIGHT OVARY: SURFACE PSAMMOMA BODIES. NO DEFINITIVE MALIGNANT CELLS. -INCLUSION CYSTS. 4. Adnexa - ovary +/- tube, neoplastic, left - LEFT FALLOPIAN TUBE: LUMINAL AND SURFACE SEROUS CARCINOMA. - LEFT OVARY: FOCAL PSAMMOMA BODIES AND MALIGNANT CELLS. 5. Soft tissue mass, simple excision, left abdominal wall - METASTATIC SEROUS CARCINOMA. Microscopic Comment 3. OVARY or FALLOPIAN TUBE or PRIMARY PERITONEUM: Procedure: Bilateral salpingo-oophorectomy. Abdominal wall mass excision and omentectomy. Specimen Integrity: Intact. Tumor Site: Right fallopian tube. Ovarian Surface Involvement (required only if applicable): Present (left). Fallopian Tube Surface Involvement (required only if applicable): Present. Tumor Size: 1.5 cm. Histologic Type: High grade serous carcinoma. Histologic Grade: High grade. Implants (required for advanced stage serous/seromucinous borderline tumors only): Present. Other Tissue/ Organ Involvement: Omentum, abdominal wall, left fallopian tube and ovary. Largest Extrapelvic Peritoneal Focus (required only if applicable): >2 cm. Peritoneal/Ascitic Fluid: N/A. Treatment Effect (required only for high-grade serous carcinomas): N/A. Regional Lymph Nodes: No lymph nodes submitted or found Pathologic Stage Classification (pTNM, AJCC 8th Edition): pT3c, pNX Representative Tumor Block: 3A Comment(s): None.   03/21/2018 Surgery   Preoperative Diagnosis: stage IIIC primary peritoneal vs ovarian cancer   Procedure(s) Performed:  Exploratory laparotomy with bilateral  salpingo-oophorectomy, omentectomy radical tumor debulking for ovarian cancer, ventral hernia repair.   Surgeon: Thereasa Solo, MD. Specimens: Bilateral tubes / ovaries, omentum. Midline abdominal wall mass, left lateral abdominal wall mass.    Operative Findings:  Abdominal wall masses consistent with port site metastases at the umbilical incision (7cm) and left lateral incision (5cm). Omental caking in the infracolic omentum. Grossly normal very small tubes and ovaries. Normal diaphragms. No lymphadenopathy. Normal small and large intestine with the exception of miliary studding of tumor on the surface of the sigmoid colon and rectum in the cul de sac.    This represented an optimal cytoreduction (R1) with no gross visible disease remaining, however there was a thin rind of tumor on the lateral left fascia associated with the port site metastasis.     04/11/2018 Cancer Staging   Staging form: Ovary, Fallopian Tube, and Primary Peritoneal Carcinoma, AJCC 8th Edition - Pathologic: FIGO Stage IIIC (pT3, pN0, cM0) - Signed by [name], MD on 04/11/2018   05/23/2018 Imaging   1. Interval resolution of the anterior midline abdominal wall seroma. There is some persistent wispy soft tissue attenuation in the region of the midline incision, nonspecific and may simply reflect granulation tissue from surgery. 2. Stable 13 mm exophytic lesion posterior left kidney with attenuation higher than expected for a simple cyst. This may be a cyst complicated by proteinaceous debris or hemorrhage, but attention on follow-up recommended. 3. Stable mild persistent distention of proximal jejunal loops with gradual tapering to nondilated distal small bowel. 4. No overt peritoneal or mesenteric disease identified on this exam.   05/23/2018 Tumor Marker   Patient's tumor was tested for the following markers: CA-125 Results of the tumor marker test revealed 11.6   06/02/2018 - 09/20/2018 Chemotherapy   The patient  had carboplatin and taxol   06/19/2018 Procedure   Status post  right IJ port catheter placement. Catheter ready for use.   06/26/2018 Tumor Marker   Patient's tumor was tested for the following markers: CA-125. Results of the tumor marker test revealed 10.7   09/20/2018 Tumor Marker   Patient's tumor was tested for the following markers: CA-125. Results of the tumor marker test revealed 9.1   10/25/2018 Imaging   1. There is suspicious, abnormal asymmetric increased soft tissue involving the cecum and ascending colon. This is suspicious for residual/progressive serosal involvement by tumor. 2. No additional sites of disease identified. No evidence for nodal metastasis, solid organ metastasis or ascites.     11/07/2018 Procedure   She had negative colonoscopy evaluation   12/05/2018 Tumor Marker   Patient's tumor was tested for the following markers: CA-125 Results of the tumor marker test revealed 8.6   01/25/2019 Imaging   Ct abdomen and pelvis Signs of omentectomy and hysterectomy without definitive signs of residual recurrent disease. Small nodular focus bridging rectum and vagina is stable dating back January to 12/14/2018, potentially postoperative but suggest attention on subsequent imaging.   01/25/2019 Tumor Marker   Patient's tumor was tested for the following markers: CA-125 Results of the tumor marker test revealed 10.3     REVIEW OF SYSTEMS:   Constitutional: Denies fevers, chills or abnormal weight loss Eyes: Denies blurriness of vision Ears, nose, mouth, throat, and face: Denies mucositis or sore throat Respiratory: Denies cough, dyspnea or wheezes Cardiovascular: Denies palpitation, chest discomfort or lower extremity swelling Gastrointestinal:  Denies nausea, heartburn or change in bowel habits Skin: Denies abnormal skin rashes Lymphatics: Denies new lymphadenopathy or easy bruising Neurological:Denies numbness, tingling or new weaknesses Behavioral/Psych: Mood is  stable, no new changes  All other systems were reviewed with the patient and are negative.  I have reviewed the past medical history, past surgical history, social history and family history with the patient and they are unchanged from previous note.  ALLERGIES:  is allergic to ivp dye [iodinated diagnostic agents]; propoxyphene; codeine; and ultram [tramadol hcl].  MEDICATIONS:  Current Outpatient Medications  Medication Sig Dispense Refill  . carboxymethylcellulose (REFRESH PLUS) 0.5 % SOLN Place 1 drop into both eyes 3 (three) times daily as needed (for dry eyes.).    Marland Kitchen Cholecalciferol (VITAMIN D3 SUPER STRENGTH) 50 MCG (2000 UT) TABS Take 2,000 Units by mouth daily.    . diphenhydrAMINE (BENADRYL) 50 MG tablet Take 1 tablet 1 hour prior to scan 1 tablet 0  . DULoxetine (CYMBALTA) 20 MG capsule Take 1 capsule (20 mg total) by mouth daily. 30 capsule 3  . lidocaine-prilocaine (EMLA) cream Apply to affected area once 30 g 3  . polyethylene glycol (MIRALAX) packet Take 17 g by mouth daily. 14 each 1  . predniSONE (DELTASONE) 50 MG tablet Take 1 tablet 13 hours prior to scan, 1 tab 7 hours prior, 1 tab 1 hour prior.  Also take OTC Benadryl 59m 1 hour prior to scan. 3 tablet 0  . senna-docusate ([employer] SENNA PLUS) 8.6-50 MG tablet TAKE 1-2 TABLETS BY MOUTH AT BEDTIME *HOLD IF HAVING LOOSE STOOLS* 60 tablet 0  . traZODone (DESYREL) 50 MG tablet Take 50 mg by mouth at bedtime as needed for sleep.   0  . triamcinolone cream (KENALOG) 0.5 % Apply 1 application topically 2 (two) times daily. Apply to eyelids/eyebrows  4   No current facility-administered medications for this visit.     PHYSICAL EXAMINATION: ECOG PERFORMANCE STATUS: 1 - Symptomatic but completely ambulatory  Vitals:  03/15/19 1225  BP: (!) 150/73  Pulse: 82  Resp: 18  Temp: 98.2 F (36.8 C)  SpO2: 100%   Filed Weights   03/15/19 1225  Weight: 151 lb 6.4 oz (68.7 kg)    GENERAL:alert, no distress and comfortable SKIN:  skin color, texture, turgor are normal, no rashes or significant lesions EYES: normal, Conjunctiva are pink and non-injected, sclera clear OROPHARYNX:no exudate, no erythema and lips, buccal mucosa, and tongue normal  NECK: supple, thyroid normal size, non-tender, without nodularity LYMPH:  no palpable lymphadenopathy in the cervical, axillary or inguinal LUNGS: clear to auscultation and percussion with normal breathing effort HEART: regular rate & rhythm and no murmurs and no lower extremity edema ABDOMEN:abdomen soft, non-tender and normal bowel sounds Musculoskeletal:no cyanosis of digits and no clubbing  NEURO: alert & oriented x 3 with fluent speech, no focal motor/sensory deficits  LABORATORY DATA:  I have reviewed the data as listed    Component Value Date/Time   NA 142 03/15/2019 1058   K 3.7 03/15/2019 1058   CL 108 03/15/2019 1058   CO2 24 03/15/2019 1058   GLUCOSE 97 03/15/2019 1058   BUN 20 03/15/2019 1058   CREATININE 0.72 03/15/2019 1058   CALCIUM 9.0 03/15/2019 1058   PROT 6.8 03/15/2019 1058   ALBUMIN 3.9 03/15/2019 1058   AST 19 03/15/2019 1058   ALT 16 03/15/2019 1058   ALKPHOS 55 03/15/2019 1058   BILITOT 0.3 03/15/2019 1058   GFRNONAA >60 03/15/2019 1058   GFRAA >60 03/15/2019 1058    No results found for: SPEP, UPEP  Lab Results  Component Value Date   WBC 9.3 03/15/2019   NEUTROABS 5.1 03/15/2019   HGB 12.6 03/15/2019   HCT 39.3 03/15/2019   MCV 88.5 03/15/2019   PLT 185 03/15/2019      Chemistry      Component Value Date/Time   NA 142 03/15/2019 1058   K 3.7 03/15/2019 1058   CL 108 03/15/2019 1058   CO2 24 03/15/2019 1058   BUN 20 03/15/2019 1058   CREATININE 0.72 03/15/2019 1058      Component Value Date/Time   CALCIUM 9.0 03/15/2019 1058   ALKPHOS 55 03/15/2019 1058   AST 19 03/15/2019 1058   ALT 16 03/15/2019 1058   BILITOT 0.3 03/15/2019 1058      All questions were answered. The patient knows to call the clinic with any  problems, questions or concerns. No barriers to learning was detected.  I spent 15 minutes counseling the patient face to face. The total time spent in the appointment was 20 minutes and more than 50% was on counseling and review of test results  [name], MD 03/15/2019 1:29 PM

## 2019-03-15 NOTE — Assessment & Plan Note (Signed)
Clinically, she have no signs of cancer recurrence Tumor marker is pending Her last CT imaging in October 2020 showed no evidence of disease Due to history of contrast allergy, she will be premedicated for future CT imaging I will see her back in 2 months for further follow-up and blood work along with physical examination We will reserve to repeat imaging study only if she has elevated tumor markers or signs of cancer recurrence

## 2019-03-16 ENCOUNTER — Telehealth: Payer: Self-pay

## 2019-03-16 LAB — CA 125: Cancer Antigen (CA) 125: 9.6 U/mL (ref 0.0–38.1)

## 2019-03-16 NOTE — Telephone Encounter (Signed)
-----   Message from [name], MD sent at 03/16/2019  7:56 AM EST ----- Regarding: CA-125 is better, pls let her know

## 2019-03-16 NOTE — Telephone Encounter (Signed)
Called and given below message. She verbalized understanding. 

## 2019-03-20 ENCOUNTER — Telehealth: Payer: Self-pay | Admitting: Oncology

## 2019-03-20 NOTE — Telephone Encounter (Signed)
Misty Hopkins called and asked about setting up flush appointments for her port. She is worried about missing having it flushed with the holidays coming up.

## 2019-03-20 NOTE — Telephone Encounter (Signed)
OK I sent scheduling msg for next month

## 2019-03-21 ENCOUNTER — Telehealth: Payer: Self-pay | Admitting: Hematology and Oncology

## 2019-03-21 NOTE — Telephone Encounter (Signed)
Scheduled appt per 11/24 sch message - pt aware of appt date and time

## 2019-03-23 ENCOUNTER — Other Ambulatory Visit: Payer: Self-pay

## 2019-03-23 ENCOUNTER — Encounter (HOSPITAL_COMMUNITY): Payer: Self-pay

## 2019-03-23 ENCOUNTER — Telehealth: Payer: Self-pay

## 2019-03-23 ENCOUNTER — Emergency Department (HOSPITAL_COMMUNITY)
Admission: EM | Admit: 2019-03-23 | Discharge: 2019-03-23 | Disposition: A | Discharge status: Home / Self Care | Payer: Medicare HMO | Attending: Emergency Medicine | Admitting: Emergency Medicine

## 2019-03-23 ENCOUNTER — Emergency Department (HOSPITAL_COMMUNITY): Payer: Medicare HMO

## 2019-03-23 DIAGNOSIS — Z8543 Personal history of malignant neoplasm of ovary: Secondary | ICD-10-CM | POA: Insufficient documentation

## 2019-03-23 DIAGNOSIS — R1084 Generalized abdominal pain: Secondary | ICD-10-CM | POA: Insufficient documentation

## 2019-03-23 DIAGNOSIS — D72829 Elevated white blood cell count, unspecified: Secondary | ICD-10-CM | POA: Diagnosis not present

## 2019-03-23 DIAGNOSIS — Z79899 Other long term (current) drug therapy: Secondary | ICD-10-CM | POA: Diagnosis not present

## 2019-03-23 LAB — URINALYSIS, ROUTINE W REFLEX MICROSCOPIC
Bacteria, UA: NONE SEEN
Bilirubin Urine: NEGATIVE
Glucose, UA: NEGATIVE mg/dL
Hgb urine dipstick: NEGATIVE
Ketones, ur: NEGATIVE mg/dL
Nitrite: NEGATIVE
Protein, ur: NEGATIVE mg/dL
Specific Gravity, Urine: 1.02 (ref 1.005–1.030)
pH: 5 (ref 5.0–8.0)

## 2019-03-23 LAB — COMPREHENSIVE METABOLIC PANEL
ALT: 20 U/L (ref 0–44)
AST: 21 U/L (ref 15–41)
Albumin: 4.3 g/dL (ref 3.5–5.0)
Alkaline Phosphatase: 56 U/L (ref 38–126)
Anion gap: 11 (ref 5–15)
BUN: 20 mg/dL (ref 8–23)
CO2: 23 mmol/L (ref 22–32)
Calcium: 10 mg/dL (ref 8.9–10.3)
Chloride: 106 mmol/L (ref 98–111)
Creatinine, Ser: 0.81 mg/dL (ref 0.44–1.00)
GFR calc Af Amer: >60 mL/min (ref >60)
GFR calc non Af Amer: >60 mL/min (ref >60)
Glucose, Bld: 133 mg/dL — ABNORMAL HIGH (ref 70–99)
Potassium: 4 mmol/L (ref 3.5–5.1)
Sodium: 140 mmol/L (ref 135–145)
Total Bilirubin: 0.6 mg/dL (ref 0.3–1.2)
Total Protein: 7.4 g/dL (ref 6.5–8.1)

## 2019-03-23 LAB — CBC
HCT: 44 % (ref 36.0–46.0)
Hemoglobin: 13.9 g/dL (ref 12.0–15.0)
MCH: 28.2 pg (ref 26.0–34.0)
MCHC: 31.6 g/dL (ref 30.0–36.0)
MCV: 89.2 fL (ref 80.0–100.0)
Platelets: 217 10*3/uL (ref 150–400)
RBC: 4.93 MIL/uL (ref 3.87–5.11)
RDW: 13.4 % (ref 11.5–15.5)
WBC: 15.7 10*3/uL — ABNORMAL HIGH (ref 4.0–10.5)
nRBC: 0 % (ref 0.0–0.2)

## 2019-03-23 LAB — LIPASE, BLOOD: Lipase: 25 U/L (ref 11–51)

## 2019-03-23 MED ORDER — SODIUM CHLORIDE 0.9% FLUSH: 3.0000 mL | Freq: Once | INTRAVENOUS | Status: DC

## 2019-03-23 MED ORDER — DICYCLOMINE HCL 10 MG PO CAPS: 10.0000 mg | ORAL_CAPSULE | Freq: Two times a day (BID) | ORAL | 0 refills | Status: DC | PRN

## 2019-03-23 NOTE — ED Notes (Signed)
Patient transported to CT 

## 2019-03-23 NOTE — Telephone Encounter (Signed)
Pt called with c/o intermittent abdominal cramping in the area of her umbilicus.  I referred her to her PCP for further evaluation.

## 2019-03-23 NOTE — ED Triage Notes (Signed)
Pt presents with c/o abdominal pain that started today. Pt reports that she is a cancer pt, ovarian cancer, finished chemo in June. Pt called her primary and she suggested that she come here today.

## 2019-03-23 NOTE — ED Provider Notes (Signed)
[facility] DEPT Provider Note   CSN: [id number] Arrival date & time: 03/23/19  1509     History   Chief Complaint Chief Complaint  Patient presents with  . Abdominal Pain    HPI Misty Hopkins is a 78 y.o. female.     The history is provided by the patient and medical records. No language interpreter was used.  Abdominal Pain  Misty Hopkins is a 78 y.o. female who presents to the Emergency Department complaining of abdominal pain. She presents the emergency department complaining of sudden onset central abdominal pain that began around 11 AM this morning. She has a history of ovarian cancer status post resection and chemotherapy. She has been doing well since chemotherapy in her surgery and feels like she has recovered. She has been experiencing mild nausea recently. Her abdominal pain is described as sharp and achy in nature. She took a pain pill prior to leaving for the emergency department and states that her pain has eased off significantly since taking it. She denies any fevers, vomiting, diarrhea, dysuria. No prior similar symptoms. Past Medical History:  Diagnosis Date  . Cancer ([place])   . Complication of anesthesia    Difficult to put to sleep  . Family history of lung cancer   . Family history of lymphoma   . GERD (gastroesophageal reflux disease)     Patient Active Problem List   Diagnosis Date Noted  . Left ulnar fracture 12/06/2018  . Acute back pain with sciatica 10/26/2018  . Arthralgia 08/28/2018  . Elevated BP without diagnosis of hypertension 08/28/2018  . Genetic testing 06/15/2018  . Other constipation 06/08/2018  . 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 Family history of lymphoma   . Family history of lung cancer   . 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 Ovarian cancer ([name]) 03/21/2018  . Omental mass 03/01/2018    Past Surgical History:  Procedure  Laterality Date  . Bladder Lift  2000  . DEBULKING N/A 03/21/2018   Procedure: RADICAL TUMOR DEBULKING;  Surgeon: Everitt Amber, MD;  Location: [facility];  Service: Gynecology;  Laterality: N/A;  . EYE SURGERY  2017   Cataracts (Bilateral)  . IR IMAGING GUIDED PORT INSERTION  06/19/2018  . LAPAROSCOPIC SALPINGO OOPHERECTOMY Bilateral 03/21/2018   Procedure: SALPINGO OOPHORECTOMY;  Surgeon: Everitt Amber, MD;  Location: [facility];  Service: Gynecology;  Laterality: Bilateral;  . LAPAROSCOPY N/A 03/06/2018   Procedure: LAPAROSCOPY DIAGNOSTIC OMENTAL BIOPSY;  Surgeon: Everitt Amber, MD;  Location: [facility];  Service: Gynecology;  Laterality: N/A;  . LAPAROTOMY N/A 03/21/2018   Procedure: EXPLORATORY LAPAROTOMY, INCISIONAL HERNIA REPAIR;  Surgeon: Everitt Amber, MD;  Location: [facility];  Service: Gynecology;  Laterality: N/A;  . OMENTECTOMY N/A 03/21/2018   Procedure: OMENTECTOMY;  Surgeon: Everitt Amber, MD;  Location: [facility];  Service: Gynecology;  Laterality: N/A;  . PARTIAL HYSTERECTOMY     40 years ago  . Rectum Lift  2001  . TONSILLECTOMY       OB History   No obstetric history on file.      Home Medications    Prior to Admission medications   Medication Sig Start Date End Date Taking? Authorizing Provider  Biotin 1 MG CAPS Take 1 tablet by mouth daily.   Yes [provider]  Calcium Carb-Cholecalciferol (CALCIUM 600 + D PO) Take 1 tablet by mouth daily.   Yes [provider]  carboxymethylcellulose (REFRESH PLUS)  0.5 % SOLN Place 1 drop into both eyes 3 (three) times daily as needed (for dry eyes.).   Yes [provider]  Cholecalciferol (VITAMIN D3 SUPER STRENGTH) 50 MCG (2000 UT) TABS Take 2,000 Units by mouth daily.   Yes [provider]  HYDROcodone-acetaminophen (NORCO/VICODIN) 5-325 MG tablet Take 1 tablet by mouth every 4 (four) hours as needed for pain. 11/29/18  Yes [provider]  triamcinolone cream (KENALOG) 0.5 % Apply 1  application topically 2 (two) times daily. Apply to eyelids/eyebrows 02/16/18  Yes [provider]  dicyclomine (BENTYL) 10 MG capsule Take 1 capsule (10 mg total) by mouth 2 (two) times daily as needed for spasms. 03/23/19   Quintella Reichert, MD  diphenhydrAMINE (BENADRYL) 50 MG tablet Take 1 tablet 1 hour prior to scan Patient not taking: Reported on 03/23/2019 10/23/18   [name], MD  DULoxetine (CYMBALTA) 20 MG capsule Take 1 capsule (20 mg total) by mouth daily. Patient not taking: Reported on 03/23/2019 06/07/18   [name], MD  lidocaine-prilocaine (EMLA) cream Apply to affected area once Patient not taking: Reported on 03/23/2019 01/26/19   [name], MD  polyethylene glycol [facility]) packet Take 17 g by mouth daily. Patient not taking: Reported on 03/23/2019 06/07/18   [name], MD  predniSONE (DELTASONE) 50 MG tablet Take 1 tablet 13 hours prior to scan, 1 tab 7 hours prior, 1 tab 1 hour prior.  Also take OTC Benadryl 50mg  1 hour prior to scan. Patient not taking: Reported on 03/23/2019 12/06/18   [name], MD  senna-docusate ([employer] SENNA PLUS) 8.6-50 MG tablet TAKE 1-2 TABLETS BY MOUTH AT BEDTIME *HOLD IF HAVING LOOSE STOOLS* Patient not taking: Reported on 03/23/2019 10/09/18   Dorothyann Gibbs, NP    Family History Family History  Problem Relation Age of Onset  . Diabetes Mother   . Stroke Mother   . Non-Hodgkin's lymphoma Brother 19       exp to agent orange  . Heart disease Maternal Grandmother   . Lung cancer Maternal Grandfather   . Tuberculosis Paternal Grandmother     Social History Social History   Tobacco Use  . Smoking status: Never Smoker  . Smokeless tobacco: Never Used  Substance Use Topics  . Alcohol use: Never    Frequency: Never  . Drug use: Never     Allergies   Ivp dye [iodinated diagnostic agents], Propoxyphene, Codeine, and Ultram [tramadol hcl]   Review of Systems Review of Systems  Gastrointestinal: Positive for abdominal  pain.  All other systems reviewed and are negative.    Physical Exam Updated Vital Signs BP (!) 137/111   Pulse 81   Temp 97.6 F (36.4 C) (Oral)   Resp 18   Ht 5\' 4"  (1.626 m)   Wt 67.1 kg   SpO2 97%   BMI 25.40 kg/m   Physical Exam Vitals signs and nursing note reviewed.  Constitutional:      Appearance: She is well-developed.  HENT:     Head: Normocephalic and atraumatic.  Cardiovascular:     Rate and Rhythm: Normal rate and regular rhythm.     Heart sounds: No murmur.  Pulmonary:     Effort: Pulmonary effort is normal. No respiratory distress.     Breath sounds: Normal breath sounds.  Abdominal:     Palpations: Abdomen is soft.     Tenderness: There is no guarding or rebound.     Comments: Mild generalized abdominal tenderness  Musculoskeletal:  General: No swelling or tenderness.  Skin:    General: Skin is warm and dry.     Capillary Refill: Capillary refill takes less than 2 seconds.  Neurological:     Mental Status: She is alert and oriented to person, place, and time.  Psychiatric:        Behavior: Behavior normal.      ED Treatments / Results  Labs (all labs ordered are listed, but only abnormal results are displayed) Labs Reviewed  COMPREHENSIVE METABOLIC PANEL - Abnormal; Notable for the following components:      Result Value   Glucose, Bld 133 (*)    All other components within normal limits  CBC - Abnormal; Notable for the following components:   WBC 15.7 (*)    All other components within normal limits  URINALYSIS, ROUTINE W REFLEX MICROSCOPIC - Abnormal; Notable for the following components:   Leukocytes,Ua SMALL (*)    All other components within normal limits  LIPASE, BLOOD    EKG None  Radiology Ct Abdomen Pelvis Wo Contrast  Result Date: 03/23/2019 CLINICAL DATA:  Abdominal pain.  History of ovarian cancer. EXAM: CT ABDOMEN AND PELVIS WITHOUT CONTRAST TECHNIQUE: Multidetector CT imaging of the abdomen and pelvis was  performed following the standard protocol without IV contrast. COMPARISON:  01/25/2019 FINDINGS: Lower chest: Lung bases are clear. No effusions. Heart is normal size. Hepatobiliary: No focal hepatic abnormality. Gallbladder unremarkable. Pancreas: No focal abnormality or ductal dilatation. Spleen: No focal abnormality.  Normal size. Adrenals/Urinary Tract: Small cyst off the upper pole of the left kidney. No hydronephrosis. No renal or adrenal mass. Urinary bladder unremarkable. Stomach/Bowel: Colonic diverticulosis, most pronounced in the sigmoid colon. No active diverticulitis. No evidence of bowel obstruction. Vascular/Lymphatic: Aortic atherosclerosis. No enlarged abdominal or pelvic lymph nodes. Reproductive: Prior hysterectomy.  No adnexal masses. Other: Prior omentectomy.  No free fluid or free air. Musculoskeletal: No acute bony abnormality. IMPRESSION: No acute findings in the abdomen or pelvis. Colonic diverticulosis.  No active diverticulitis. Prior open tectum E and hysterectomy. Aortic atherosclerosis. Electronically Signed   By: Rolm Baptise M.D.   On: 03/23/2019 20:40    Procedures Procedures (including critical care time)  Medications Ordered in ED Medications - No data to display   Initial Impression / Assessment and Plan / ED Course  I have reviewed the triage vital signs and the nursing notes.  Pertinent labs & imaging results that were available during my care of the patient were reviewed by me and considered in my medical decision making (see chart for details).        Patient with history of ovarian cancer status post resection, chemotherapy here for evaluation of abdominal pain. She has tenderness on examination without peritoneal findings. Labs demonstrate mild leukocytosis but there is no evidence of acute infectious process. Overall patient pain has improved during her ED stay due to medication she took prior to ED arrival. She does have a contrast allergy and a  noncontrast CT scan was obtained. CT is negative for acute abnormality, obstruction. Discussed with patient unclear source of her symptoms. Plan to discharge home with close outpatient follow-up and return precautions.  Final Clinical Impressions(s) / ED Diagnoses   Final diagnoses:  Generalized abdominal pain  Leukocytosis, unspecified type    ED Discharge Orders         Ordered    dicyclomine (BENTYL) 10 MG capsule  2 times daily PRN     03/23/19 2058  Quintella Reichert, MD 03/24/19 0040

## 2019-03-23 NOTE — ED Notes (Signed)
Pt provided labeled specimen cup for U/A collection per MD order. [employer]

## 2019-04-10 ENCOUNTER — Inpatient Hospital Stay: Payer: Medicare HMO | Admitting: Hematology and Oncology

## 2019-04-10 ENCOUNTER — Inpatient Hospital Stay: Payer: Medicare HMO

## 2019-04-10 ENCOUNTER — Telehealth: Payer: Self-pay | Admitting: Oncology

## 2019-04-10 NOTE — Telephone Encounter (Signed)
No she does not need another scan I recommend miralax daily and senna at night

## 2019-04-10 NOTE — Telephone Encounter (Signed)
Called Misty Hopkins and advised her of message below from Dr. Alvy Bimler.  She verbalized understanding.

## 2019-04-10 NOTE — Telephone Encounter (Signed)
Called Misty Hopkins and discussed that she can reschedule her appointments for today to 05/10/2019 since she had a CT scan on 03/23/19 and her port was flushed on 03/15/19.  Advised her that a scheduler will call her with the new appointments.  She verbalized agreement.  She said she is feeling good except for some constipation.  She is taking senna plus at night and is eating lots of greens/drinking water.  Advised her to try adding Miralax in the morning to see if that helps.  She also said she did not get contrast in the ED for the CT scan and wants to make sure that is ok.  She is wondering when she will need another scan.

## 2019-04-12 ENCOUNTER — Telehealth: Payer: Self-pay | Admitting: Hematology and Oncology

## 2019-04-12 NOTE — Telephone Encounter (Signed)
Scheduled appt per 12/15 sch message - unable to reach pt or leave message . Mailed letter with appt date and time

## 2019-05-10 ENCOUNTER — Inpatient Hospital Stay: Payer: Medicare HMO

## 2019-05-10 ENCOUNTER — Other Ambulatory Visit: Payer: Self-pay

## 2019-05-10 ENCOUNTER — Inpatient Hospital Stay: Payer: Medicare HMO | Attending: Gynecologic Oncology | Admitting: Hematology and Oncology

## 2019-05-10 DIAGNOSIS — Z9221 Personal history of antineoplastic chemotherapy: Secondary | ICD-10-CM | POA: Diagnosis not present

## 2019-05-10 DIAGNOSIS — R971 Elevated cancer antigen 125 [CA 125]: Secondary | ICD-10-CM | POA: Diagnosis not present

## 2019-05-10 DIAGNOSIS — C5701 Malignant neoplasm of right fallopian tube: Secondary | ICD-10-CM | POA: Insufficient documentation

## 2019-05-10 DIAGNOSIS — R197 Diarrhea, unspecified: Secondary | ICD-10-CM | POA: Diagnosis not present

## 2019-05-10 DIAGNOSIS — C569 Malignant neoplasm of unspecified ovary: Secondary | ICD-10-CM

## 2019-05-10 DIAGNOSIS — Z79899 Other long term (current) drug therapy: Secondary | ICD-10-CM | POA: Diagnosis not present

## 2019-05-10 DIAGNOSIS — K5909 Other constipation: Secondary | ICD-10-CM

## 2019-05-10 DIAGNOSIS — R194 Change in bowel habit: Secondary | ICD-10-CM | POA: Insufficient documentation

## 2019-05-10 DIAGNOSIS — N281 Cyst of kidney, acquired: Secondary | ICD-10-CM | POA: Insufficient documentation

## 2019-05-10 DIAGNOSIS — C561 Malignant neoplasm of right ovary: Secondary | ICD-10-CM | POA: Diagnosis present

## 2019-05-10 DIAGNOSIS — R140 Abdominal distension (gaseous): Secondary | ICD-10-CM | POA: Insufficient documentation

## 2019-05-10 DIAGNOSIS — Z885 Allergy status to narcotic agent status: Secondary | ICD-10-CM | POA: Insufficient documentation

## 2019-05-10 DIAGNOSIS — I700 Atherosclerosis of aorta: Secondary | ICD-10-CM | POA: Diagnosis not present

## 2019-05-10 LAB — CMP (CANCER CENTER ONLY)
ALT: 12 U/L (ref 0–44)
AST: 16 U/L (ref 15–41)
Albumin: 3.8 g/dL (ref 3.5–5.0)
Alkaline Phosphatase: 61 U/L (ref 38–126)
Anion gap: 9 (ref 5–15)
BUN: 17 mg/dL (ref 8–23)
CO2: 25 mmol/L (ref 22–32)
Calcium: 8.6 mg/dL — ABNORMAL LOW (ref 8.9–10.3)
Chloride: 108 mmol/L (ref 98–111)
Creatinine: 0.79 mg/dL (ref 0.44–1.00)
GFR, Est AFR Am: >60 mL/min (ref >60)
GFR, Estimated: >60 mL/min (ref >60)
Glucose, Bld: 115 mg/dL — ABNORMAL HIGH (ref 70–99)
Potassium: 3.9 mmol/L (ref 3.5–5.1)
Sodium: 142 mmol/L (ref 135–145)
Total Bilirubin: 0.3 mg/dL (ref 0.3–1.2)
Total Protein: 6.8 g/dL (ref 6.5–8.1)

## 2019-05-10 LAB — CBC WITH DIFFERENTIAL (CANCER CENTER ONLY)
Abs Immature Granulocytes: 0.03 10*3/uL (ref 0.00–0.07)
Basophils Absolute: 0.1 10*3/uL (ref 0.0–0.1)
Basophils Relative: 1 %
Eosinophils Absolute: 0.2 10*3/uL (ref 0.0–0.5)
Eosinophils Relative: 2 %
HCT: 39.8 % (ref 36.0–46.0)
Hemoglobin: 12.9 g/dL (ref 12.0–15.0)
Immature Granulocytes: 0 %
Lymphocytes Relative: 30 %
Lymphs Abs: 2.6 10*3/uL (ref 0.7–4.0)
MCH: 28.5 pg (ref 26.0–34.0)
MCHC: 32.4 g/dL (ref 30.0–36.0)
MCV: 88.1 fL (ref 80.0–100.0)
Monocytes Absolute: 0.8 10*3/uL (ref 0.1–1.0)
Monocytes Relative: 9 %
Neutro Abs: 5 10*3/uL (ref 1.7–7.7)
Neutrophils Relative %: 58 %
Platelet Count: 180 10*3/uL (ref 150–400)
RBC: 4.52 MIL/uL (ref 3.87–5.11)
RDW: 13.5 % (ref 11.5–15.5)
WBC Count: 8.5 10*3/uL (ref 4.0–10.5)
nRBC: 0 % (ref 0.0–0.2)

## 2019-05-10 MED ORDER — SODIUM CHLORIDE 0.9% FLUSH
10.0000 mL | Freq: Once | INTRAVENOUS | Status: AC
  Administered 2019-05-10: 10 mL
  Filled 2019-05-10: qty 10

## 2019-05-10 MED ORDER — HEPARIN SOD (PORK) LOCK FLUSH 100 UNIT/ML IV SOLN
500.0000 [IU] | Freq: Once | INTRAVENOUS | Status: AC
  Administered 2019-05-10: 500 [IU]
  Filled 2019-05-10: qty 5

## 2019-05-11 ENCOUNTER — Encounter: Payer: Self-pay | Admitting: Hematology and Oncology

## 2019-05-11 LAB — CA 125: Cancer Antigen (CA) 125: 11.8 U/mL (ref 0.0–38.1)

## 2019-05-11 NOTE — Progress Notes (Signed)
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[facility]) Her recent CT imaging in November was unremarkable for cancer recurrence Her examination is benign and her tumor marker is stable She continues to have symptoms of bowel habit changes with incomplete evacuation and the way she wanted it even though she had regular bowel movement This is not specific for signs of cancer recurrence Due to her history of contrast allergy, ordering CT imaging on a regular basis to address the symptom in my opinion is not appropriate I recommend continue close follow-up with history and exam along with tumor marker monitoring and to address her bowel symptoms separately I will get her seen by GYN oncologist in her next visit  Other constipation The patient continues to describe symptoms of constipation When I did well into the specific nature of her symptom of constipation, according to the patient, even though she had bowel movement, she felt that she is not completely empty and have to take laxatives on a regular basis to get the push needed to completely evacuate her bowels This has been ongoing even before surgery I reviewed her last colonoscopy which show evidence of diverticulosis that was also seen on CT imaging I felt that she likely had dysmotility of bowels due to history of chronic constipation I tried to explain to the patient if she had bowel movement, she is technically not consider constipated I do not recommend her to take chronic Senokot therapy The patient has regular fiber in her diet along with oral water intake We discussed potential referral to see GI service for management   No orders of the defined types were placed in this encounter.   All questions were answered. The patient knows to call the clinic with any problems, questions or  concerns. The total time spent in the appointment was 20 minutes encounter with patients including review of chart and various tests results, discussions about plan of care and coordination of care plan   [name], MD 05/11/2019 7:42 AM  INTERVAL HISTORY: Please see below for problem oriented charting. She returns for further follow-up Her appetite is stable She is eating well Her main complaint involves overall symptom of constipation She used to take MiraLAX on a regular basis to stimulate regular bowel movement but she did not feel that that was helpful She subsequently switched to using Senokot on a regular basis Despite this, she have a sensation of incomplete bowel evacuation She had bowel movement with MiraLAX but she said it was only a small amount and she has sensation that she still need to go to the bathroom She had significant sensation of gassiness of which she take Bentyl on a regular basis Denies recent weight loss  SUMMARY OF ONCOLOGIC HISTORY: Oncology History Overview Note  High grade serous, right fallopian tube  HRD, BRCA tumor testing were neg   Ovarian cancer ([place])  03/21/2018 Initial Diagnosis   Ovarian cancer ([place])   Fallopian tube cancer, carcinoma, right ([place])  10/24/2017 Initial Diagnosis   She has presentation of vague abdominal pain   02/20/2018 Imaging   Outside CT abdomen and pelvis: This revealed an incidental finding of a 1.5 cm subcapsular lesion in the lateral upper pole of the left kidney which measured higher than fluid attenuation which could represent a proteinaceous renal cyst or solid renal mass.  There was no ascites.  There  is no lymphadenopathy.  There were no masses in the pelvis including ovarian or adnexal masses seen.  There was however soft tissue stranding seen with nodularity in the omental fat in the lower abdomen without evidence of ascites.  This was felt to represent either carcinomatosis or inflammatory infectious etiologies.   There was colonic diverticulosis seen without radiographic evidence of diverticulitis.  The radiologist recommended MRI of the abdomen to further work-up the renal mass   02/22/2018 Tumor Marker   Patient's tumor was tested for the following markers: CA-125. Results of the tumor marker test revealed 103.   03/06/2018 Pathology Results   Omentum, biopsy - ADENOCARCINOMA WITH PSAMMOMA BODIES, SEE COMMENT. Microscopic Comment There are scattered small foci of malignant glands with associated psammoma bodies. While the tumor is somewhat limited the cells have a more low grade appearance. There is prominent lymphovascular invasion. Immunohistochemistry reveals the cells are positive for cytokeratin 7, ER, MOC31, PAX8, and WT-1. They are negative for calretinin, cytokeratin 20, CDX-2, and TTF-1. Overall, the findings are consistent with adenocarcinoma. The differential includes a primary gynecologic or peritoneal tumor, although the morphology is not typical of a high grade serous carcinoma.   03/06/2018 Surgery   Surgeon: Donaciano Eva   Pre-operative Diagnosis: omental mass, elevated CA 125 Operation: Laparoscopic omentectomy  Surgeon: Donaciano Eva  Operative Findings:  : normal diaphragm and upper omentum. Sigmoid colon densely adherent to left pelvic side wall consistent with history of diverticulitis. Omentum adherent to anterior abdominal wall and sigmoid colon. Surgically absent uterus. Right ovary very small and grossly normal. Left ovary obscured by adhesed sigmoid colon. No ascites. No carcinomatosis. There was a nodular firm distal omentum on the left which was adherent to the sigmoid colon and most consistent with inflammatory change.      03/21/2018 Pathology Results   1. Soft tissue mass, simple excision, midline abdominal wall mass - METASTATIC SEROUS CARCINOMA. 2. Omentum, resection for tumor - METASTATIC SEROUS CARCINOMA. 3. Adnexa - ovary +/- tube,  neoplastic, right - RIGHT FALLOPIAN TUBE: -HIGH GRADE SEROUS CARCINOMA. -SEE ONCOLOGY TABLE. - RIGHT OVARY: SURFACE PSAMMOMA BODIES. NO DEFINITIVE MALIGNANT CELLS. -INCLUSION CYSTS. 4. Adnexa - ovary +/- tube, neoplastic, left - LEFT FALLOPIAN TUBE: LUMINAL AND SURFACE SEROUS CARCINOMA. - LEFT OVARY: FOCAL PSAMMOMA BODIES AND MALIGNANT CELLS. 5. Soft tissue mass, simple excision, left abdominal wall - METASTATIC SEROUS CARCINOMA. Microscopic Comment 3. OVARY or FALLOPIAN TUBE or PRIMARY PERITONEUM: Procedure: Bilateral salpingo-oophorectomy. Abdominal wall mass excision and omentectomy. Specimen Integrity: Intact. Tumor Site: Right fallopian tube. Ovarian Surface Involvement (required only if applicable): Present (left). Fallopian Tube Surface Involvement (required only if applicable): Present. Tumor Size: 1.5 cm. Histologic Type: High grade serous carcinoma. Histologic Grade: High grade. Implants (required for advanced stage serous/seromucinous borderline tumors only): Present. Other Tissue/ Organ Involvement: Omentum, abdominal wall, left fallopian tube and ovary. Largest Extrapelvic Peritoneal Focus (required only if applicable): >2 cm. Peritoneal/Ascitic Fluid: N/A. Treatment Effect (required only for high-grade serous carcinomas): N/A. Regional Lymph Nodes: No lymph nodes submitted or found Pathologic Stage Classification (pTNM, AJCC 8th Edition): pT3c, pNX Representative Tumor Block: 3A Comment(s): None.   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   Surgeon: Thereasa Solo, MD. Specimens: Bilateral tubes / ovaries, omentum. Midline abdominal wall mass, left lateral abdominal wall mass.    Operative Findings:  Abdominal wall masses consistent with port  site metastases at the umbilical incision (7cm) and left lateral incision (5cm). Omental caking in the infracolic omentum. Grossly normal very small tubes and ovaries. Normal diaphragms. No lymphadenopathy. Normal small and large intestine with the exception of miliary studding of tumor on the surface of the sigmoid colon and rectum in the cul de sac.    This represented an optimal cytoreduction (R1) with no gross visible disease remaining, however there was a thin rind of tumor on the lateral left fascia associated with the port site metastasis.     04/11/2018 Cancer Staging   Staging form: Ovary, Fallopian Tube, and Primary Peritoneal Carcinoma, AJCC 8th Edition - Pathologic: FIGO Stage IIIC (pT3, pN0, cM0) - Signed by [name], MD on 04/11/2018   05/23/2018 Imaging   1. Interval resolution of the anterior midline abdominal wall seroma. There is some persistent wispy soft tissue attenuation in the region of the midline incision, nonspecific and may simply reflect granulation tissue from surgery. 2. Stable 13 mm exophytic lesion posterior left kidney with attenuation higher than expected for a simple cyst. This may be a cyst complicated by proteinaceous debris or hemorrhage, but attention on follow-up recommended. 3. Stable mild persistent distention of proximal jejunal loops with gradual tapering to nondilated distal small bowel. 4. No overt peritoneal or mesenteric disease identified on this exam.   05/23/2018 Tumor Marker   Patient's tumor was tested for the following markers: CA-125 Results of the tumor marker test revealed 11.6   06/02/2018 - 09/20/2018 Chemotherapy   The patient had carboplatin and taxol   06/19/2018 Procedure   Status post right IJ port catheter placement. Catheter ready for use.   06/26/2018 Tumor Marker   Patient's tumor was tested for the following markers: CA-125. Results of the tumor marker test revealed 10.7   09/20/2018 Tumor Marker   Patient's tumor was  tested for the following markers: CA-125. Results of the tumor marker test revealed 9.1   10/25/2018 Imaging   1. There is suspicious, abnormal asymmetric increased soft tissue involving the cecum and ascending colon. This is suspicious for residual/progressive serosal involvement by tumor. 2. No additional sites of disease identified. No evidence for nodal metastasis, solid organ metastasis or ascites.     11/07/2018 Procedure   She had negative colonoscopy evaluation   12/05/2018 Tumor Marker   Patient's tumor was tested for the following markers: CA-125 Results of the tumor marker test revealed 8.6   01/25/2019 Imaging   Ct abdomen and pelvis Signs of omentectomy and hysterectomy without definitive signs of residual recurrent disease. Small nodular focus bridging rectum and vagina is stable dating back January to 12/14/2018, potentially postoperative but suggest attention on subsequent imaging.   01/25/2019 Tumor Marker   Patient's tumor was tested for the following markers: CA-125 Results of the tumor marker test revealed 10.3   03/15/2019 Tumor Marker   Patient's tumor was tested for the following markers: CA-125 Results of the tumor marker test revealed 9.6   03/23/2019 Imaging   No acute findings in the abdomen or pelvis.   Colonic diverticulosis.  No active diverticulitis.   Prior open tectum E and hysterectomy.   Aortic atherosclerosis.     05/10/2019 Tumor Marker   Patient's tumor was tested for the following markers: CA-125 Results of the tumor marker test revealed 11.8     REVIEW OF SYSTEMS:   Constitutional: Denies fevers, chills or abnormal weight loss Eyes: Denies blurriness of vision Ears, nose,  mouth, throat, and face: Denies mucositis or sore throat Respiratory: Denies cough, dyspnea or wheezes Cardiovascular: Denies palpitation, chest discomfort or lower extremity swelling Skin: Denies abnormal skin rashes Lymphatics: Denies new lymphadenopathy or easy  bruising Neurological:Denies numbness, tingling or new weaknesses Behavioral/Psych: Mood is stable, no new changes  All other systems were reviewed with the patient and are negative.  I have reviewed the past medical history, past surgical history, social history and family history with the patient and they are unchanged from previous note.  ALLERGIES:  is allergic to ivp dye [iodinated diagnostic agents]; propoxyphene; codeine; and ultram [tramadol hcl].  MEDICATIONS:  Current Outpatient Medications  Medication Sig Dispense Refill  . Biotin 1 MG CAPS Take 1 tablet by mouth daily.    . Calcium Carb-Cholecalciferol (CALCIUM 600 + D PO) Take 1 tablet by mouth daily.    . carboxymethylcellulose (REFRESH PLUS) 0.5 % SOLN Place 1 drop into both eyes 3 (three) times daily as needed (for dry eyes.).    Marland Kitchen Cholecalciferol (VITAMIN D3 SUPER STRENGTH) 50 MCG (2000 UT) TABS Take 2,000 Units by mouth daily.    . diphenhydrAMINE (BENADRYL) 50 MG tablet Take 1 tablet 1 hour prior to scan (Patient not taking: Reported on 03/23/2019) 1 tablet 0  . lidocaine-prilocaine (EMLA) cream Apply to affected area once (Patient not taking: Reported on 03/23/2019) 30 g 3  . polyethylene glycol (MIRALAX) packet Take 17 g by mouth daily. (Patient not taking: Reported on 03/23/2019) 14 each 1  . predniSONE (DELTASONE) 50 MG tablet Take 1 tablet 13 hours prior to scan, 1 tab 7 hours prior, 1 tab 1 hour prior.  Also take OTC Benadryl 32m 1 hour prior to scan. (Patient not taking: Reported on 03/23/2019) 3 tablet 0  . senna-docusate ([facility] SENNA PLUS) 8.6-50 MG tablet TAKE 1-2 TABLETS BY MOUTH AT BEDTIME *HOLD IF HAVING LOOSE STOOLS* (Patient not taking: Reported on 03/23/2019) 60 tablet 0  . triamcinolone cream (KENALOG) 0.5 % Apply 1 application topically 2 (two) times daily. Apply to eyelids/eyebrows  4   No current facility-administered medications for this visit.    PHYSICAL EXAMINATION: ECOG PERFORMANCE STATUS: 1 -  Symptomatic but completely ambulatory  Vitals:   05/10/19 1133  BP: 137/69  Pulse: 81  Resp: 18  Temp: 98.4 F (36.9 C)  SpO2: 98%   Filed Weights   05/10/19 1133  Weight: 155 lb 6.4 oz (70.5 kg)    GENERAL:alert, no distress and comfortable SKIN: skin color, texture, turgor are normal, no rashes or significant lesions EYES: normal, Conjunctiva are pink and non-injected, sclera clear OROPHARYNX:no exudate, no erythema and lips, buccal mucosa, and tongue normal  NECK: supple, thyroid normal size, non-tender, without nodularity LYMPH:  no palpable lymphadenopathy in the cervical, axillary or inguinal LUNGS: clear to auscultation and percussion with normal breathing effort HEART: regular rate & rhythm and no murmurs and no lower extremity edema ABDOMEN:abdomen soft, non-tender and normal bowel sounds Musculoskeletal:no cyanosis of digits and no clubbing  NEURO: alert & oriented x 3 with fluent speech, no focal motor/sensory deficits  LABORATORY DATA:  I have reviewed the data as listed    Component Value Date/Time   NA 142 05/10/2019 1035   K 3.9 05/10/2019 1035   CL 108 05/10/2019 1035   CO2 25 05/10/2019 1035   GLUCOSE 115 (H) 05/10/2019 1035   BUN 17 05/10/2019 1035   CREATININE 0.79 05/10/2019 1035   CALCIUM 8.6 (L) 05/10/2019 1035   PROT 6.8 05/10/2019 1035  ALBUMIN 3.8 05/10/2019 1035   AST 16 05/10/2019 1035   ALT 12 05/10/2019 1035   ALKPHOS 61 05/10/2019 1035   BILITOT 0.3 05/10/2019 1035   GFRNONAA >60 05/10/2019 1035   GFRAA >60 05/10/2019 1035    No results found for: SPEP, UPEP  Lab Results  Component Value Date   WBC 8.5 05/10/2019   NEUTROABS 5.0 05/10/2019   HGB 12.9 05/10/2019   HCT 39.8 05/10/2019   MCV 88.1 05/10/2019   PLT 180 05/10/2019      Chemistry      Component Value Date/Time   NA 142 05/10/2019 1035   K 3.9 05/10/2019 1035   CL 108 05/10/2019 1035   CO2 25 05/10/2019 1035   BUN 17 05/10/2019 1035   CREATININE 0.79  05/10/2019 1035      Component Value Date/Time   CALCIUM 8.6 (L) 05/10/2019 1035   ALKPHOS 61 05/10/2019 1035   AST 16 05/10/2019 1035   ALT 12 05/10/2019 1035   BILITOT 0.3 05/10/2019 1035

## 2019-05-11 NOTE — Assessment & Plan Note (Signed)
Her recent CT imaging in November was unremarkable for cancer recurrence Her examination is benign and her tumor marker is stable She continues to have symptoms of bowel habit changes with incomplete evacuation and the way she wanted it even though she had regular bowel movement This is not specific for signs of cancer recurrence Due to her history of contrast allergy, ordering CT imaging on a regular basis to address the symptom in my opinion is not appropriate I recommend continue close follow-up with history and exam along with tumor marker monitoring and to address her bowel symptoms separately I will get her seen by GYN oncologist in her next visit

## 2019-05-11 NOTE — Assessment & Plan Note (Signed)
The patient continues to describe symptoms of constipation When I did well into the specific nature of her symptom of constipation, according to the patient, even though she had bowel movement, she felt that she is not completely empty and have to take laxatives on a regular basis to get the push needed to completely evacuate her bowels This has been ongoing even before surgery I reviewed her last colonoscopy which show evidence of diverticulosis that was also seen on CT imaging I felt that she likely had dysmotility of bowels due to history of chronic constipation I tried to explain to the patient if she had bowel movement, she is technically not consider constipated I do not recommend her to take chronic Senokot therapy The patient has regular fiber in her diet along with oral water intake We discussed potential referral to see GI service for management

## 2019-05-14 ENCOUNTER — Telehealth: Payer: Self-pay | Admitting: Oncology

## 2019-05-14 NOTE — Telephone Encounter (Signed)
Misty Hopkins of normal CA 125 results per Dr. Alvy Bimler.  She verbalized understanding and agreement.

## 2019-05-16 ENCOUNTER — Other Ambulatory Visit: Payer: Self-pay | Admitting: Hematology and Oncology

## 2019-05-16 ENCOUNTER — Telehealth: Payer: Self-pay | Admitting: Oncology

## 2019-05-16 DIAGNOSIS — C569 Malignant neoplasm of unspecified ovary: Secondary | ICD-10-CM

## 2019-05-16 DIAGNOSIS — C5701 Malignant neoplasm of right fallopian tube: Secondary | ICD-10-CM

## 2019-05-16 MED ORDER — DIPHENHYDRAMINE HCL 50 MG PO TABS: ORAL_TABLET | ORAL | 0 refills | Status: DC

## 2019-05-16 MED ORDER — PREDNISONE 50 MG PO TABS: ORAL_TABLET | ORAL | 0 refills | Status: DC

## 2019-05-16 NOTE — Telephone Encounter (Signed)
Notified Misty Hopkins that Dr. Denman George is recommending having a CT scan done.  Gave her appointment for the CT scan on 05/22/19 at 2:30 pm with the following instructions:  Pick up prednisone and benadryl [facility] from [facility] [place] and contrast from the [place].  Take first prednisone tablet at 1:30 am on 05/22/19. Take second prednisone tablet at 7:30 am on 05/22/19.  Do not eat or drink anything except contrast after 10:30 am on 05/22/19. Drink first bottle of contrast at 12:30 pm on 05/22/19. Take third prednisone tablet, benadryl tablet and second bottle of contrast at 1:30 pm.  Also scheduled follow up appointment with Dr. Alvy Bimler on 05/23/19 at 8:30 am to review results.  Misty Hopkins verbalized understanding in teach back mode of all instructions.

## 2019-05-22 ENCOUNTER — Encounter (HOSPITAL_COMMUNITY): Payer: Self-pay

## 2019-05-22 ENCOUNTER — Other Ambulatory Visit: Payer: Self-pay

## 2019-05-22 ENCOUNTER — Ambulatory Visit (HOSPITAL_COMMUNITY)
Admission: RE | Admit: 2019-05-22 | Discharge: 2019-05-22 | Disposition: A | Discharge status: Home / Self Care | Payer: Medicare HMO | Source: Ambulatory Visit | Attending: Hematology and Oncology | Admitting: Hematology and Oncology

## 2019-05-22 DIAGNOSIS — C5701 Malignant neoplasm of right fallopian tube: Secondary | ICD-10-CM

## 2019-05-22 DIAGNOSIS — C569 Malignant neoplasm of unspecified ovary: Secondary | ICD-10-CM | POA: Diagnosis present

## 2019-05-22 MED ORDER — HEPARIN SOD (PORK) LOCK FLUSH 100 UNIT/ML IV SOLN
500.0000 [IU] | Freq: Once | INTRAVENOUS | Status: AC
  Administered 2019-05-22: 500 [IU] via INTRAVENOUS

## 2019-05-22 MED ORDER — IOHEXOL 300 MG/ML  SOLN
100.0000 mL | Freq: Once | INTRAMUSCULAR | Status: AC | PRN
  Administered 2019-05-22: 100 mL via INTRAVENOUS

## 2019-05-22 MED ORDER — SODIUM CHLORIDE (PF) 0.9 % IJ SOLN
INTRAMUSCULAR | Status: AC
  Filled 2019-05-22: qty 50

## 2019-05-22 MED ORDER — HEPARIN SOD (PORK) LOCK FLUSH 100 UNIT/ML IV SOLN
INTRAVENOUS | Status: AC
  Filled 2019-05-22: qty 5

## 2019-05-23 ENCOUNTER — Telehealth: Payer: Self-pay | Admitting: Oncology

## 2019-05-23 ENCOUNTER — Inpatient Hospital Stay (HOSPITAL_BASED_OUTPATIENT_CLINIC_OR_DEPARTMENT_OTHER): Payer: Medicare HMO | Admitting: Hematology and Oncology

## 2019-05-23 ENCOUNTER — Other Ambulatory Visit: Payer: Self-pay

## 2019-05-23 ENCOUNTER — Encounter: Payer: Self-pay | Admitting: Hematology and Oncology

## 2019-05-23 DIAGNOSIS — C5701 Malignant neoplasm of right fallopian tube: Secondary | ICD-10-CM | POA: Diagnosis not present

## 2019-05-23 DIAGNOSIS — K5909 Other constipation: Secondary | ICD-10-CM

## 2019-05-23 NOTE — Assessment & Plan Note (Signed)
Her examination is benign and her tumor marker is stable She continues to have symptoms of bowel habit changes with incomplete evacuation and the way she wanted it even though she had regular bowel movement This is not specific for signs of cancer recurrence CT imaging showed no evidence of cancer We will continue close monitoring and follow-up She is scheduled to see me in 6 months I will get her an appointment to see GYN surgeon in 3 months We will continue port maintenance and blood work monitoring

## 2019-05-23 NOTE — Progress Notes (Signed)
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[facility]) Her examination is benign and her tumor marker is stable She continues to have symptoms of bowel habit changes with incomplete evacuation and the way she wanted it even though she had regular bowel movement This is not specific for signs of cancer recurrence CT imaging showed no evidence of cancer We will continue close monitoring and follow-up She is scheduled to see me in 6 months I will get her an appointment to see GYN surgeon in 3 months We will continue port maintenance and blood work monitoring  Other constipation The patient continues to describe symptoms of constipation I reviewed her last colonoscopy which show evidence of diverticulosis that was also seen on CT imaging I felt that she likely had dysmotility of bowels due to history of chronic constipation and prior surgery I tried to explain to the patient if she had bowel movement, she is technically not consider constipated I do not recommend her to take chronic Senokot therapy.  I recommend her to consider taking MiraLAX on a regular basis The patient has regular fiber in her diet along with oral water intake    No orders of the defined types were placed in this encounter.   All questions were answered. The patient knows to call the clinic with any problems, questions or concerns. The total time spent in the appointment was 20 minutes encounter with patients including review of chart and various tests results, discussions about plan of care and coordination of care plan   [name], MD 05/23/2019 9:18 AM  INTERVAL HISTORY: Please see below for problem oriented charting. She returns for further follow-up She continues to have mild intermittent changes in bowel habits She had large loose stool after CT imaging  yesterday Otherwise, she feels fine Appetite is good  SUMMARY OF ONCOLOGIC HISTORY: Oncology History Overview Note  High grade serous, right fallopian tube  HRD, BRCA tumor testing were neg   Ovarian cancer ([place])  03/21/2018 Initial Diagnosis   Ovarian cancer ([place])   Fallopian tube cancer, carcinoma, right ([place])  10/24/2017 Initial Diagnosis   She has presentation of vague abdominal pain   02/20/2018 Imaging   Outside CT abdomen and pelvis: This revealed an incidental finding of a 1.5 cm subcapsular lesion in the lateral upper pole of the left kidney which measured higher than fluid attenuation which could represent a proteinaceous renal cyst or solid renal mass.  There was no ascites.  There is no lymphadenopathy.  There were no masses in the pelvis including ovarian or adnexal masses seen.  There was however soft tissue stranding seen with nodularity in the omental fat in the lower abdomen without evidence of ascites.  This was felt to represent either carcinomatosis or inflammatory infectious etiologies.  There was colonic diverticulosis seen without radiographic evidence of diverticulitis.  The radiologist recommended MRI of the abdomen to further work-up the renal mass   02/22/2018 Tumor Marker   Patient's tumor was tested for the following markers: CA-125. Results of the tumor marker test revealed 103.   03/06/2018 Pathology Results   Omentum, biopsy - ADENOCARCINOMA WITH PSAMMOMA BODIES, SEE COMMENT. Microscopic Comment There are scattered small foci of malignant glands with associated psammoma bodies. While the tumor is somewhat limited the cells have a more low grade appearance. There is prominent lymphovascular invasion. Immunohistochemistry reveals the cells are  positive for cytokeratin 7, ER, MOC31, PAX8, and WT-1. They are negative for calretinin, cytokeratin 20, CDX-2, and TTF-1. Overall, the findings are consistent with adenocarcinoma. The differential includes a primary  gynecologic or peritoneal tumor, although the morphology is not typical of a high grade serous carcinoma.   03/06/2018 Surgery   Surgeon: Donaciano Eva   Pre-operative Diagnosis: omental mass, elevated CA 125 Operation: Laparoscopic omentectomy  Surgeon: Donaciano Eva  Operative Findings:  : normal diaphragm and upper omentum. Sigmoid colon densely adherent to left pelvic side wall consistent with history of diverticulitis. Omentum adherent to anterior abdominal wall and sigmoid colon. Surgically absent uterus. Right ovary very small and grossly normal. Left ovary obscured by adhesed sigmoid colon. No ascites. No carcinomatosis. There was a nodular firm distal omentum on the left which was adherent to the sigmoid colon and most consistent with inflammatory change.      03/21/2018 Pathology Results   1. Soft tissue mass, simple excision, midline abdominal wall mass - METASTATIC SEROUS CARCINOMA. 2. Omentum, resection for tumor - METASTATIC SEROUS CARCINOMA. 3. Adnexa - ovary +/- tube, neoplastic, right - RIGHT FALLOPIAN TUBE: -HIGH GRADE SEROUS CARCINOMA. -SEE ONCOLOGY TABLE. - RIGHT OVARY: SURFACE PSAMMOMA BODIES. NO DEFINITIVE MALIGNANT CELLS. -INCLUSION CYSTS. 4. Adnexa - ovary +/- tube, neoplastic, left - LEFT FALLOPIAN TUBE: LUMINAL AND SURFACE SEROUS CARCINOMA. - LEFT OVARY: FOCAL PSAMMOMA BODIES AND MALIGNANT CELLS. 5. Soft tissue mass, simple excision, left abdominal wall - METASTATIC SEROUS CARCINOMA. Microscopic Comment 3. OVARY or FALLOPIAN TUBE or PRIMARY PERITONEUM: Procedure: Bilateral salpingo-oophorectomy. Abdominal wall mass excision and omentectomy. Specimen Integrity: Intact. Tumor Site: Right fallopian tube. Ovarian Surface Involvement (required only if applicable): Present (left). Fallopian Tube Surface Involvement (required only if applicable): Present. Tumor Size: 1.5 cm. Histologic Type: High grade serous carcinoma. Histologic Grade:  High grade. Implants (required for advanced stage serous/seromucinous borderline tumors only): Present. Other Tissue/ Organ Involvement: Omentum, abdominal wall, left fallopian tube and ovary. Largest Extrapelvic Peritoneal Focus (required only if applicable): >2 cm. Peritoneal/Ascitic Fluid: N/A. Treatment Effect (required only for high-grade serous carcinomas): N/A. Regional Lymph Nodes: No lymph nodes submitted or found Pathologic Stage Classification (pTNM, AJCC 8th Edition): pT3c, pNX Representative Tumor Block: 3A Comment(s): None.   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   Surgeon: Thereasa Solo, MD. Specimens: Bilateral tubes / ovaries, omentum. Midline abdominal wall mass, left lateral abdominal wall mass.    Operative Findings:  Abdominal wall masses consistent with port site metastases at the umbilical incision (7cm) and left lateral incision (5cm). Omental caking in the infracolic omentum. Grossly normal very small tubes and ovaries. Normal diaphragms. No lymphadenopathy. Normal small and large intestine with the exception of miliary studding of tumor on the surface of the sigmoid colon and rectum in the cul de sac.    This represented an optimal cytoreduction (R1) with no gross visible disease remaining, however there was a thin rind of tumor on the lateral left fascia associated with the port site metastasis.     04/11/2018 Cancer Staging   Staging form: Ovary, Fallopian Tube, and Primary Peritoneal Carcinoma, AJCC 8th Edition - Pathologic: FIGO Stage IIIC (pT3, pN0, cM0) - Signed by [name], MD on 04/11/2018   05/23/2018 Imaging   1. Interval resolution of the anterior midline abdominal wall seroma. There is some persistent wispy  soft tissue  attenuation in the region of the midline incision, nonspecific and may simply reflect granulation tissue from surgery. 2. Stable 13 mm exophytic lesion posterior left kidney with attenuation higher than expected for a simple cyst. This may be a cyst complicated by proteinaceous debris or hemorrhage, but attention on follow-up recommended. 3. Stable mild persistent distention of proximal jejunal loops with gradual tapering to nondilated distal small bowel. 4. No overt peritoneal or mesenteric disease identified on this exam.   05/23/2018 Tumor Marker   Patient's tumor was tested for the following markers: CA-125 Results of the tumor marker test revealed 11.6   06/02/2018 - 09/20/2018 Chemotherapy   The patient had carboplatin and taxol   06/19/2018 Procedure   Status post right IJ port catheter placement. Catheter ready for use.   06/26/2018 Tumor Marker   Patient's tumor was tested for the following markers: CA-125. Results of the tumor marker test revealed 10.7   09/20/2018 Tumor Marker   Patient's tumor was tested for the following markers: CA-125. Results of the tumor marker test revealed 9.1   10/25/2018 Imaging   1. There is suspicious, abnormal asymmetric increased soft tissue involving the cecum and ascending colon. This is suspicious for residual/progressive serosal involvement by tumor. 2. No additional sites of disease identified. No evidence for nodal metastasis, solid organ metastasis or ascites.     11/07/2018 Procedure   She had negative colonoscopy evaluation   12/05/2018 Tumor Marker   Patient's tumor was tested for the following markers: CA-125 Results of the tumor marker test revealed 8.6   01/25/2019 Imaging   Ct abdomen and pelvis Signs of omentectomy and hysterectomy without definitive signs of residual recurrent disease. Small nodular focus bridging rectum and vagina is stable dating back January to 12/14/2018, potentially postoperative but suggest attention on  subsequent imaging.   01/25/2019 Tumor Marker   Patient's tumor was tested for the following markers: CA-125 Results of the tumor marker test revealed 10.3   03/15/2019 Tumor Marker   Patient's tumor was tested for the following markers: CA-125 Results of the tumor marker test revealed 9.6   03/23/2019 Imaging   No acute findings in the abdomen or pelvis.   Colonic diverticulosis.  No active diverticulitis.   Prior open tectum E and hysterectomy.   Aortic atherosclerosis.     05/10/2019 Tumor Marker   Patient's tumor was tested for the following markers: CA-125 Results of the tumor marker test revealed 11.8   05/22/2019 Imaging   1. Stable exam. No evidence of recurrent or metastatic carcinoma within the abdomen or pelvis. 2. Colonic diverticulosis, without radiographic evidence of diverticulitis.       REVIEW OF SYSTEMS:   Constitutional: Denies fevers, chills or abnormal weight loss Eyes: Denies blurriness of vision Ears, nose, mouth, throat, and face: Denies mucositis or sore throat Respiratory: Denies cough, dyspnea or wheezes Cardiovascular: Denies palpitation, chest discomfort or lower extremity swelling Skin: Denies abnormal skin rashes Lymphatics: Denies new lymphadenopathy or easy bruising Neurological:Denies numbness, tingling or new weaknesses Behavioral/Psych: Mood is stable, no new changes  All other systems were reviewed with the patient and are negative.  I have reviewed the past medical history, past surgical history, social history and family history with the patient and they are unchanged from previous note.  ALLERGIES:  is allergic to ivp dye [iodinated diagnostic agents]; propoxyphene; codeine; and ultram [tramadol hcl].  MEDICATIONS:  Current Outpatient Medications  Medication Sig Dispense Refill  . Biotin 1 MG CAPS Take 1  tablet by mouth daily.    . Calcium Carb-Cholecalciferol (CALCIUM 600 + D PO) Take 1 tablet by mouth daily.    .  carboxymethylcellulose (REFRESH PLUS) 0.5 % SOLN Place 1 drop into both eyes 3 (three) times daily as needed (for dry eyes.).    Marland Kitchen Cholecalciferol (VITAMIN D3 SUPER STRENGTH) 50 MCG (2000 UT) TABS Take 2,000 Units by mouth daily.    . diphenhydrAMINE (BENADRYL) 50 MG tablet Take 1 tablet 1 hour prior to scan 1 tablet 0  . lidocaine-prilocaine (EMLA) cream Apply to affected area once (Patient not taking: Reported on 03/23/2019) 30 g 3  . polyethylene glycol (MIRALAX) packet Take 17 g by mouth daily. (Patient not taking: Reported on 03/23/2019) 14 each 1  . predniSONE (DELTASONE) 50 MG tablet Take 1 tablet 13 hours prior to scan, 1 tab 7 hours prior, 1 tab 1 hour prior.  Also take OTC Benadryl 63m 1 hour prior to scan. 3 tablet 0  . senna-docusate ([employer] SENNA PLUS) 8.6-50 MG tablet TAKE 1-2 TABLETS BY MOUTH AT BEDTIME *HOLD IF HAVING LOOSE STOOLS* (Patient not taking: Reported on 03/23/2019) 60 tablet 0  . triamcinolone cream (KENALOG) 0.5 % Apply 1 application topically 2 (two) times daily. Apply to eyelids/eyebrows  4   No current facility-administered medications for this visit.    PHYSICAL EXAMINATION: ECOG PERFORMANCE STATUS: 0 - Asymptomatic  Vitals:   05/23/19 0807  BP: (!) 147/80  Pulse: 70  Resp: 17  Temp: (!) 97 F (36.1 C)  SpO2: 99%   Filed Weights   05/23/19 0807  Weight: 155 lb 12.8 oz (70.7 kg)    GENERAL:alert, no distress and comfortable NEURO: alert & oriented x 3 with fluent speech, no focal motor/sensory deficits  LABORATORY DATA:  I have reviewed the data as listed    Component Value Date/Time   NA 142 05/10/2019 1035   K 3.9 05/10/2019 1035   CL 108 05/10/2019 1035   CO2 25 05/10/2019 1035   GLUCOSE 115 (H) 05/10/2019 1035   BUN 17 05/10/2019 1035   CREATININE 0.79 05/10/2019 1035   CALCIUM 8.6 (L) 05/10/2019 1035   PROT 6.8 05/10/2019 1035   ALBUMIN 3.8 05/10/2019 1035   AST 16 05/10/2019 1035   ALT 12 05/10/2019 1035   ALKPHOS 61 05/10/2019 1035    BILITOT 0.3 05/10/2019 1035   GFRNONAA >60 05/10/2019 1035   GFRAA >60 05/10/2019 1035    No results found for: SPEP, UPEP  Lab Results  Component Value Date   WBC 8.5 05/10/2019   NEUTROABS 5.0 05/10/2019   HGB 12.9 05/10/2019   HCT 39.8 05/10/2019   MCV 88.1 05/10/2019   PLT 180 05/10/2019      Chemistry      Component Value Date/Time   NA 142 05/10/2019 1035   K 3.9 05/10/2019 1035   CL 108 05/10/2019 1035   CO2 25 05/10/2019 1035   BUN 17 05/10/2019 1035   CREATININE 0.79 05/10/2019 1035      Component Value Date/Time   CALCIUM 8.6 (L) 05/10/2019 1035   ALKPHOS 61 05/10/2019 1035   AST 16 05/10/2019 1035   ALT 12 05/10/2019 1035   BILITOT 0.3 05/10/2019 1035       RADIOGRAPHIC STUDIES: I have reviewed multiple CT imaging with the patient I have personally reviewed the radiological images as listed and agreed with the findings in the report. CT ABDOMEN PELVIS W CONTRAST  Result Date: 05/22/2019 CLINICAL DATA:  Follow-up metastatic ovarian and  fallopian tube carcinoma. Status post surgery and chemotherapy. EXAM: CT ABDOMEN AND PELVIS WITH CONTRAST TECHNIQUE: Multidetector CT imaging of the abdomen and pelvis was performed using the standard protocol following bolus administration of intravenous contrast. CONTRAST:  13m OMNIPAQUE IOHEXOL 300 MG/ML  SOLN COMPARISON:  01/25/2019 FINDINGS: Lower Chest: No acute findings. Hepatobiliary: No hepatic masses identified. Gallbladder is unremarkable. No evidence of biliary ductal dilatation. Pancreas:  No mass or inflammatory changes. Spleen: Within normal limits in size and appearance. Adrenals/Urinary Tract: No masses identified. Stable small left renal cysts. No evidence of hydronephrosis. Unremarkable unopacified urinary bladder. Stomach/Bowel: No evidence of obstruction, inflammatory process or abnormal fluid collections. Diffuse colonic diverticulosis is again demonstrated, without evidence of diverticulitis.  Vascular/Lymphatic: No pathologically enlarged lymph nodes. No abdominal aortic aneurysm. Aortic atherosclerosis incidentally noted. Reproductive: Prior hysterectomy. No evidence of recurrent pelvic mass. No evidence of peritoneal soft tissue nodularity or ascites. Other:  None. Musculoskeletal:  No suspicious bone lesions identified. IMPRESSION: 1. Stable exam. No evidence of recurrent or metastatic carcinoma within the abdomen or pelvis. 2. Colonic diverticulosis, without radiographic evidence of diverticulitis. Electronically Signed   By: JMarlaine HindM.D.   On: 05/22/2019 16:19

## 2019-05-23 NOTE — Telephone Encounter (Signed)
Scheduled follow up appointment with Dr. Denman George on 08/31/19 following lab and flush appointments.  Paislyn verbalized understanding of appointment dates and times.

## 2019-05-23 NOTE — Assessment & Plan Note (Signed)
The patient continues to describe symptoms of constipation I reviewed her last colonoscopy which show evidence of diverticulosis that was also seen on CT imaging I felt that she likely had dysmotility of bowels due to history of chronic constipation and prior surgery I tried to explain to the patient if she had bowel movement, she is technically not consider constipated I do not recommend her to take chronic Senokot therapy.  I recommend her to consider taking MiraLAX on a regular basis The patient has regular fiber in her diet along with oral water intake

## 2019-06-11 ENCOUNTER — Telehealth: Payer: Self-pay | Admitting: Oncology

## 2019-06-11 NOTE — Telephone Encounter (Signed)
Misty Hopkins called and said she is trying to see a gastroenterologist for diverticulosis.  She said she saw Dr. Audie Box for a colonoscopy before.  Advised her to call Dr. Kathalene Frames office for an appointment and to call back if she needs a new referral.

## 2019-06-11 NOTE — Telephone Encounter (Signed)
Misty Hopkins called and asked if she can get a referral to a gastroenterologist in [place].  She is worried that she is getting diverticulitis because she is having "a little discomfort" in her lower abdomen and when she has a bowel movement, it is not "completing."    She tried calling Dr. Audie Box, who did her last colonoscopy, and is not able to get an appointment until May.  She does not want to wait that long and is wondering if she get a referral to a GI doctor in [place].

## 2019-06-12 ENCOUNTER — Other Ambulatory Visit: Payer: Self-pay | Admitting: Hematology and Oncology

## 2019-06-12 DIAGNOSIS — K5730 Diverticulosis of large intestine without perforation or abscess without bleeding: Secondary | ICD-10-CM

## 2019-06-12 NOTE — Telephone Encounter (Signed)
I placed order for referral I cannot guarantee it will be faster here

## 2019-06-12 NOTE — Telephone Encounter (Signed)
Called [facility] regarding GI referral.  She said [facility] just called her and she has an appointment with Dr. Hilarie Fredrickson on Friday.

## 2019-06-15 ENCOUNTER — Ambulatory Visit: Payer: Medicare HMO | Admitting: Internal Medicine

## 2019-06-15 ENCOUNTER — Encounter: Payer: Self-pay | Admitting: *Deleted

## 2019-06-15 VITALS — BP 170/96 | HR 77 | Temp 98.2°F | Ht 64.0 in | Wt 155.0 lb

## 2019-06-15 DIAGNOSIS — K5730 Diverticulosis of large intestine without perforation or abscess without bleeding: Secondary | ICD-10-CM | POA: Diagnosis not present

## 2019-06-15 DIAGNOSIS — K5900 Constipation, unspecified: Secondary | ICD-10-CM

## 2019-06-15 NOTE — Patient Instructions (Signed)
Discontinue Miralax.  Please purchase the following medications over the counter and take as directed: Senna 1-2 tablets every night  Please follow up with Dr Hilarie Fredrickson in 3-4 months.  If you are age 77 or older, your body mass index should be between 23-30. Your Body mass index is 26.61 kg/m. If this is out of the aforementioned range listed, please consider follow up with your Primary Care Provider.  If you are age 83 or younger, your body mass index should be between 19-25. Your Body mass index is 26.61 kg/m. If this is out of the aformentioned range listed, please consider follow up with your Primary Care Provider.

## 2019-06-15 NOTE — Progress Notes (Signed)
Patient ID: Misty Hopkins, female   DOB: March 22, 1941, 79 y.o.   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  She is here alone today.  She sees Dr. Jenean Lindau for primary care and follows with Dr. Alvy Bimler with her history of ovarian cancer.  She has completed chemotherapy and is in active surveillance.  She reports that she has issues with constipation and incomplete evacuation.  This is associated with borborygmi and abdominal bloating.  One of her biggest complaints is incomplete bowel movement/evacuation.  At times she has a "raw feeling" in her lower abdomen.  She has tried [place] but this does not work well for her.  She reports this causes thin ribbonlike stools which are often small giving her the sensation of incomplete evacuation.  She often has to have multiple bowel movements over the course of a day when she uses MiraLAX regularly.  She has been using senna with more success usually 1 or 2 at bedtime.  Though she was somewhat concerned that she may become reliant on this medication so she occasionally skips days.  She has had no blood in her stool or melena.  No upper GI or hepatobiliary complaint.  She eats a varied diet high in fruits and vegetables.  She remains active walking generally 30 minutes a day.  She had a colonoscopy which was performed by Dr. Theodosia Blender in [place] on 11/07/2018.  We reviewed this together.  This showed a "very tortuous sigmoid colon with extensive diverticular disease making it difficult to negotiate to the right colon and a very redundant transverse colon.  We were able to get to evaluate the right colon and cecum thoroughly and there was no colonic wall thickening, colitis, or mass or cancer.  No polyps or  malignancy seen."  Of note when her ovarian cancer was diagnosed symptoms started with the right lower abdominal pain which was treated initially empirically as diverticulitis with antibiotics.  Pain did not improve which led to a CT scan eventually revealing the malignancy.  In retrospect she likely never has had documented diverticulitis.  Past Medical History:  Diagnosis Date  . Complication of anesthesia    Difficult to put to sleep  . Diverticulosis   . Family history of lung cancer   . Family history of lymphoma   . GERD (gastroesophageal reflux disease)   . ovarian ca dx'd 02/2018    Past Surgical History:  Procedure Laterality Date  . Bladder Lift  2000  . COLONOSCOPY    . DEBULKING N/A 03/21/2018   Procedure: RADICAL TUMOR DEBULKING;  Surgeon: Everitt Amber, MD;  Location: [facility];  Service: Gynecology;  Laterality: N/A;  . EYE SURGERY  2017   Cataracts (Bilateral)  . IR IMAGING GUIDED PORT INSERTION  06/19/2018  . LAPAROSCOPIC SALPINGO OOPHERECTOMY Bilateral 03/21/2018   Procedure: SALPINGO OOPHORECTOMY;  Surgeon: Everitt Amber, MD;  Location: [facility];  Service: Gynecology;  Laterality: Bilateral;  . LAPAROSCOPY N/A 03/06/2018   Procedure: LAPAROSCOPY DIAGNOSTIC OMENTAL BIOPSY;  Surgeon: Everitt Amber, MD;  Location: [facility];  Service: Gynecology;  Laterality: N/A;  . LAPAROTOMY N/A 03/21/2018   Procedure: EXPLORATORY LAPAROTOMY, INCISIONAL HERNIA REPAIR;  Surgeon: Everitt Amber, MD;  Location: [facility];  Service: Gynecology;  Laterality: N/A;  . OMENTECTOMY N/A 03/21/2018  Procedure: OMENTECTOMY;  Surgeon: Everitt Amber, MD;  Location: [facility];  Service: Gynecology;  Laterality: N/A;  . PARTIAL HYSTERECTOMY     40 years ago  . Rectum Lift  2001  . TONSILLECTOMY      Outpatient Medications Prior to Visit  Medication Sig Dispense Refill  . Biotin 1 MG CAPS Take 1 tablet by mouth daily.    . carboxymethylcellulose (REFRESH PLUS) 0.5 % SOLN Place 1 drop into both  eyes 3 (three) times daily as needed (for dry eyes.).    Marland Kitchen Cholecalciferol (VITAMIN D3 SUPER STRENGTH) 50 MCG (2000 UT) TABS Take 2,000 Units by mouth daily.    . polyethylene glycol (MIRALAX) packet Take 17 g by mouth daily. 14 each 1  . senna-docusate ([employer] SENNA PLUS) 8.6-50 MG tablet TAKE 1-2 TABLETS BY MOUTH AT BEDTIME *HOLD IF HAVING LOOSE STOOLS* 60 tablet 0  . triamcinolone cream (KENALOG) 0.5 % Apply 1 application topically 2 (two) times daily. Apply to eyelids/eyebrows  4  . Calcium Carb-Cholecalciferol (CALCIUM 600 + D PO) Take 1 tablet by mouth daily.    . diphenhydrAMINE (BENADRYL) 50 MG tablet Take 1 tablet 1 hour prior to scan (Patient not taking: Reported on 06/15/2019) 1 tablet 0  . lidocaine-prilocaine (EMLA) cream Apply to affected area once (Patient not taking: Reported on 03/23/2019) 30 g 3  . predniSONE (DELTASONE) 50 MG tablet Take 1 tablet 13 hours prior to scan, 1 tab 7 hours prior, 1 tab 1 hour prior.  Also take OTC Benadryl 50mg  1 hour prior to scan. (Patient not taking: Reported on 06/15/2019) 3 tablet 0   No facility-administered medications prior to visit.    Allergies  Allergen Reactions  . Ivp Dye [Iodinated Diagnostic Agents] Anaphylaxis    Anaphylaxis and hives  . Propoxyphene Anaphylaxis, Shortness Of Breath and Swelling    swollen all over  . Codeine Nausea Only and Other (See Comments)    Headaches.  Marland Kitchen Ultram [Tramadol Hcl] Hives    Family History  Problem Relation Age of Onset  . Diabetes Mother   . Stroke Mother   . Non-Hodgkin's lymphoma Brother 61       exp to agent orange  . Heart disease Maternal Grandmother   . Lung cancer Maternal Grandfather   . Tuberculosis Paternal Grandmother     Social History   Tobacco Use  . Smoking status: Never Smoker  . Smokeless tobacco: Never Used  Substance Use Topics  . Alcohol use: Never  . Drug use: Never    ROS: As per history of present illness, otherwise negative  BP (!) 170/96 (BP Location:  Right Arm, Patient Position: Sitting, Cuff Size: Normal)   Pulse 77   Temp 98.2 F (36.8 C)   Ht 5\' 4"  (1.626 m)   Wt 155 lb (70.3 kg)   SpO2 98%   BMI 26.61 kg/m  Gen: awake, alert, NAD HEENT: anicteric CV: RRR, no mrg Pulm: CTA b/l Abd: soft, NT/ND, +BS throughout Ext: no c/c/e Neuro: nonfocal   RELEVANT LABS AND IMAGING: CBC    Component Value Date/Time   WBC 8.5 05/10/2019 1035   WBC 15.7 (H) 03/23/2019 1538   RBC 4.52 05/10/2019 1035   HGB 12.9 05/10/2019 1035   HCT 39.8 05/10/2019 1035   PLT 180 05/10/2019 1035   MCV 88.1 05/10/2019 1035   MCH 28.5 05/10/2019 1035   MCHC 32.4 05/10/2019 1035   RDW 13.5 05/10/2019 1035   LYMPHSABS 2.6 05/10/2019 1035   MONOABS 0.8  05/10/2019 1035   EOSABS 0.2 05/10/2019 1035   BASOSABS 0.1 05/10/2019 1035    CMP     Component Value Date/Time   NA 142 05/10/2019 1035   K 3.9 05/10/2019 1035   CL 108 05/10/2019 1035   CO2 25 05/10/2019 1035   GLUCOSE 115 (H) 05/10/2019 1035   BUN 17 05/10/2019 1035   CREATININE 0.79 05/10/2019 1035   CALCIUM 8.6 (L) 05/10/2019 1035   PROT 6.8 05/10/2019 1035   ALBUMIN 3.8 05/10/2019 1035   AST 16 05/10/2019 1035   ALT 12 05/10/2019 1035   ALKPHOS 61 05/10/2019 1035   BILITOT 0.3 05/10/2019 1035   GFRNONAA >60 05/10/2019 1035   GFRAA >60 05/10/2019 1035   CT ABDOMEN AND PELVIS WITH CONTRAST   TECHNIQUE: Multidetector CT imaging of the abdomen and pelvis was performed using the standard protocol following bolus administration of intravenous contrast.   CONTRAST:  147mL OMNIPAQUE IOHEXOL 300 MG/ML  SOLN   COMPARISON:  01/25/2019   FINDINGS: Lower Chest: No acute findings.   Hepatobiliary: No hepatic masses identified. Gallbladder is unremarkable. No evidence of biliary ductal dilatation.   Pancreas:  No mass or inflammatory changes.   Spleen: Within normal limits in size and appearance.   Adrenals/Urinary Tract: No masses identified. Stable small left renal cysts. No  evidence of hydronephrosis. Unremarkable unopacified urinary bladder.   Stomach/Bowel: No evidence of obstruction, inflammatory process or abnormal fluid collections. Diffuse colonic diverticulosis is again demonstrated, without evidence of diverticulitis.   Vascular/Lymphatic: No pathologically enlarged lymph nodes. No abdominal aortic aneurysm. Aortic atherosclerosis incidentally noted.   Reproductive: Prior hysterectomy. No evidence of recurrent pelvic mass. No evidence of peritoneal soft tissue nodularity or ascites.   Other:  None.   Musculoskeletal:  No suspicious bone lesions identified.   IMPRESSION: 1. Stable exam. No evidence of recurrent or metastatic carcinoma within the abdomen or pelvis. 2. Colonic diverticulosis, without radiographic evidence of diverticulitis.     Electronically Signed   By: Marlaine Hind M.D.   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   1.  Symptomatic diverticular disease/constipation --we spent time today discussing colonic diverticulosis and her anatomy.  Based on imaging and colonoscopy within the last year, she has extensive colonic diverticulosis which explains her constipation, incomplete evacuation and discomfort when she is not having full bowel movements.  We discussed the difference between symptomatic diverticulosis and diverticulitis.  Her colonoscopy was complete in July 2020 without evidence of colitis or polyps/mass.  MiraLAX is not effective for her and in fact seems to be working against her given that she is having smaller more incomplete bowel movement.  We discussed prescription laxatives versus continuing senna which is working for her at present.  I tried to allay fears of dependency on  senna. --Discontinue MiraLAX --Continue senna 1 to 2 tablets nightly; she can use 1 tablet nightly as long his bowel movements are daily and effective.  If bowel movements slow down she can increase to 2 tablets at bedtime on an as-needed basis --I will see her back in 3-4 months for reassessment and if senna is less effective going forward we could try Amitiza or Linzess --Colonoscopy up-to-date with normal colonoscopy with the exception of diverticulosis as above in July 2020  2.  Carcinoma of the right  fallopian tube --treated with resection and chemotherapy on active surveillance with Dr. Alvy Bimler    [id number], [address], [place] [id number]   45 minutes total spent today including patient facing time, coordination of care, reviewing medical history/procedures/pertinent radiology studies, and documentation of the encounter.

## 2019-07-06 ENCOUNTER — Inpatient Hospital Stay: Payer: Medicare HMO | Attending: Hematology and Oncology

## 2019-07-06 ENCOUNTER — Other Ambulatory Visit: Payer: Self-pay

## 2019-07-06 DIAGNOSIS — Z452 Encounter for adjustment and management of vascular access device: Secondary | ICD-10-CM | POA: Insufficient documentation

## 2019-07-06 DIAGNOSIS — C561 Malignant neoplasm of right ovary: Secondary | ICD-10-CM | POA: Insufficient documentation

## 2019-07-06 DIAGNOSIS — C5701 Malignant neoplasm of right fallopian tube: Secondary | ICD-10-CM | POA: Diagnosis present

## 2019-07-06 MED ORDER — SODIUM CHLORIDE 0.9% FLUSH
10.0000 mL | Freq: Once | INTRAVENOUS | Status: AC
  Administered 2019-07-06: 10 mL
  Filled 2019-07-06: qty 10

## 2019-07-06 MED ORDER — HEPARIN SOD (PORK) LOCK FLUSH 100 UNIT/ML IV SOLN
500.0000 [IU] | Freq: Once | INTRAVENOUS | Status: AC
  Administered 2019-07-06: 500 [IU]
  Filled 2019-07-06: qty 5

## 2019-08-15 ENCOUNTER — Telehealth: Payer: Self-pay | Admitting: Internal Medicine

## 2019-08-15 MED ORDER — LUBIPROSTONE 8 MCG PO CAPS: 8.0000 ug | ORAL_CAPSULE | Freq: Two times a day (BID) | ORAL | 3 refills | Status: DC

## 2019-08-15 NOTE — Telephone Encounter (Signed)
Attemtped to call and line is busy. Will try again.

## 2019-08-15 NOTE — Telephone Encounter (Signed)
Patient called states she has been taking senna-docusate medication for a long time now. She is now experiencing some pain when she takes the medication and it stops when she doesn't but then she has BM changes. Would like to know if there is something else she can start taking

## 2019-08-15 NOTE — Telephone Encounter (Signed)
Spoke with pt and she is aware, script sent to pharmacy. 

## 2019-08-15 NOTE — Telephone Encounter (Signed)
D/c senna Try amtiza 8 mcg BID with food She should if ineffective, or side effects such as diarrhea

## 2019-08-15 NOTE — Telephone Encounter (Signed)
Pt states she has been taking senna plus for a long time and lately it does not seem to be working as well as it did, also states she thinks it is upsetting her stomach. Pt reports Dr. Hilarie Fredrickson mentioned something different he could prescribe if it was not helping like it used to. Please advise.

## 2019-08-16 ENCOUNTER — Other Ambulatory Visit: Payer: Self-pay

## 2019-08-16 ENCOUNTER — Telehealth: Payer: Self-pay | Admitting: Internal Medicine

## 2019-08-16 ENCOUNTER — Telehealth: Payer: Self-pay | Admitting: Oncology

## 2019-08-16 DIAGNOSIS — C5701 Malignant neoplasm of right fallopian tube: Secondary | ICD-10-CM

## 2019-08-16 MED ORDER — LINACLOTIDE 72 MCG PO CAPS: 72.0000 ug | ORAL_CAPSULE | Freq: Every day | ORAL | 3 refills | Status: DC

## 2019-08-16 MED ORDER — PREDNISONE 50 MG PO TABS: ORAL_TABLET | ORAL | 0 refills | Status: DC

## 2019-08-16 MED ORDER — DIPHENHYDRAMINE HCL 50 MG PO TABS: 50.0000 mg | ORAL_TABLET | Freq: Once | ORAL | 0 refills | Status: DC

## 2019-08-16 NOTE — Telephone Encounter (Signed)
See note below and advise. 

## 2019-08-16 NOTE — Telephone Encounter (Signed)
Attempted to return call, got busy signal. Script sent to pharmacy. Will try again.  Pt aware.

## 2019-08-16 NOTE — Telephone Encounter (Signed)
Let us try Linzess 72 mcg daily, 30 minutes or so before first meal

## 2019-08-16 NOTE — Telephone Encounter (Signed)
Called Misty Hopkins back and let her know that Dr. Denman George would like her to have a CT scan before her appointment on 08/31/19.  Advised her that it has been scheduled for 08/23/19 and that prednisone and benadryl have been sent to her pharmacy.  She will need to have labs and port flush before the scan. Also that I will send an email to her husband's email address [email or web address] (per patient request) with instructions on appointment times and when to take her medications before the scan.  She verbalized agreement and understanding.

## 2019-08-16 NOTE — Telephone Encounter (Signed)
Misty Hopkins called and said she is having a dull, gnawing pain in her stomach that has made it hard to sleep for the past two nights.  She said she has been taking Senna-Plus since surgery and is wondering if this is causing her stomach pain.   Dr. Hilarie Fredrickson with GI has sent in a prescription for a new medication but her insurance will not cover it. She called the office to see if a different medication can be sent in but hasn't heard back yet. She did start to take Metamucil yesterday and had a small bowel movement this morning.    She is wondering if she needs to be seen by Dr. Denman George earlier than 08/31/19 or if she is ok to wait to hear from Dr. Vena Rua office.

## 2019-08-22 ENCOUNTER — Ambulatory Visit: Payer: Medicare HMO | Admitting: Gynecologic Oncology

## 2019-08-23 ENCOUNTER — Encounter (HOSPITAL_COMMUNITY): Payer: Self-pay

## 2019-08-23 ENCOUNTER — Inpatient Hospital Stay: Payer: Medicare HMO | Attending: Hematology and Oncology

## 2019-08-23 ENCOUNTER — Telehealth: Payer: Self-pay

## 2019-08-23 ENCOUNTER — Inpatient Hospital Stay: Payer: Medicare HMO

## 2019-08-23 ENCOUNTER — Other Ambulatory Visit: Payer: Self-pay

## 2019-08-23 ENCOUNTER — Ambulatory Visit (HOSPITAL_COMMUNITY)
Admission: RE | Admit: 2019-08-23 | Discharge: 2019-08-23 | Disposition: A | Discharge status: Home / Self Care | Payer: Medicare HMO | Source: Ambulatory Visit | Attending: Gynecologic Oncology | Admitting: Gynecologic Oncology

## 2019-08-23 DIAGNOSIS — C5701 Malignant neoplasm of right fallopian tube: Secondary | ICD-10-CM

## 2019-08-23 DIAGNOSIS — C561 Malignant neoplasm of right ovary: Secondary | ICD-10-CM | POA: Diagnosis present

## 2019-08-23 LAB — BASIC METABOLIC PANEL - CANCER CENTER ONLY
Anion gap: 11 (ref 5–15)
BUN: 16 mg/dL (ref 8–23)
CO2: 24 mmol/L (ref 22–32)
Calcium: 9.6 mg/dL (ref 8.9–10.3)
Chloride: 106 mmol/L (ref 98–111)
Creatinine: 0.73 mg/dL (ref 0.44–1.00)
GFR, Est AFR Am: >60 mL/min (ref >60)
GFR, Estimated: >60 mL/min (ref >60)
Glucose, Bld: 156 mg/dL — ABNORMAL HIGH (ref 70–99)
Potassium: 4.1 mmol/L (ref 3.5–5.1)
Sodium: 141 mmol/L (ref 135–145)

## 2019-08-23 MED ORDER — HEPARIN SOD (PORK) LOCK FLUSH 100 UNIT/ML IV SOLN
500.0000 [IU] | Freq: Once | INTRAVENOUS | Status: AC
  Administered 2019-08-23: 500 [IU] via INTRAVENOUS

## 2019-08-23 MED ORDER — HEPARIN SOD (PORK) LOCK FLUSH 100 UNIT/ML IV SOLN
INTRAVENOUS | Status: AC
  Filled 2019-08-23: qty 5

## 2019-08-23 MED ORDER — SODIUM CHLORIDE 0.9% FLUSH
10.0000 mL | Freq: Once | INTRAVENOUS | Status: AC
  Administered 2019-08-23: 10 mL
  Filled 2019-08-23: qty 10

## 2019-08-23 MED ORDER — IOHEXOL 300 MG/ML  SOLN
100.0000 mL | Freq: Once | INTRAMUSCULAR | Status: AC | PRN
  Administered 2019-08-23: 100 mL via INTRAVENOUS

## 2019-08-23 NOTE — Telephone Encounter (Signed)
Spoke with pt to inform that CT scan shows no sign of cancer or reason for pain per APP.  Patient voiced understanding of above.

## 2019-08-24 LAB — CA 125: Cancer Antigen (CA) 125: 31.8 U/mL (ref 0.0–38.1)

## 2019-08-31 ENCOUNTER — Other Ambulatory Visit: Payer: Medicare HMO

## 2019-08-31 ENCOUNTER — Inpatient Hospital Stay: Payer: Medicare HMO | Attending: Hematology and Oncology | Admitting: Gynecologic Oncology

## 2019-08-31 ENCOUNTER — Other Ambulatory Visit: Payer: Self-pay

## 2019-08-31 ENCOUNTER — Encounter: Payer: Self-pay | Admitting: Gynecologic Oncology

## 2019-08-31 VITALS — BP 150/64 | HR 100 | Temp 98.2°F | Resp 20 | Ht 64.0 in | Wt 154.6 lb

## 2019-08-31 DIAGNOSIS — Z90722 Acquired absence of ovaries, bilateral: Secondary | ICD-10-CM | POA: Insufficient documentation

## 2019-08-31 DIAGNOSIS — Z9221 Personal history of antineoplastic chemotherapy: Secondary | ICD-10-CM | POA: Diagnosis not present

## 2019-08-31 DIAGNOSIS — R971 Elevated cancer antigen 125 [CA 125]: Secondary | ICD-10-CM | POA: Diagnosis not present

## 2019-08-31 DIAGNOSIS — C5701 Malignant neoplasm of right fallopian tube: Secondary | ICD-10-CM

## 2019-08-31 DIAGNOSIS — Z90710 Acquired absence of both cervix and uterus: Secondary | ICD-10-CM | POA: Insufficient documentation

## 2019-08-31 DIAGNOSIS — Z08 Encounter for follow-up examination after completed treatment for malignant neoplasm: Secondary | ICD-10-CM | POA: Insufficient documentation

## 2019-08-31 DIAGNOSIS — Z8544 Personal history of malignant neoplasm of other female genital organs: Secondary | ICD-10-CM | POA: Diagnosis present

## 2019-08-31 MED ORDER — ABDOMINAL BINDER/ELASTIC MED MISC: 1.0000 | Freq: Every day | 1 refills | Status: DC | PRN

## 2019-08-31 NOTE — Progress Notes (Signed)
Follow-up Note: Gyn-Onc  Consult was requested by Dr. Helane Rima for the evaluation of Misty Hopkins 79 y.o. female  CC:  Chief Complaint  Patient presents with  . Fallopian tube cancer, carcinoma, right ([place])    Follow up    Assessment/Plan:  Misty Hopkins  is a 79 y.o.  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   1/ fallopian tube cancer (right) S/p  6 cycles of adjuvant chemotherapy 09/20/18. We recommend genetic consultation. Will order this.  NED on exam and tumor markers and CT scan. However, rising CA 125.  Given no measurable disease on exam, no findings on CT and no symptoms, will continue to expectantly follow.  If elevated again in 3 months, would recommend repeat CT scan.  She'll follow-up with Dr Alvy Bimler in 3 months and with me in 3 months.   2/ abdominal pains - likes abdominal binder. Requested larger size. Ordered.   HPI: Misty Hopkins is a 79 year old P1 who was seen in consultation at the request of Dr. Helane Rima for omental nodularity and apparent carcinomatosis.  The patient reports a history of vague pelvic aching since July 2019.  This seemed worse with passage of gas through the colon and having a bowel movement.  She also noticed a change in her bowel habit with narrowing of stools and pain with having bowel movements.  She describes the pain is rolling gas pains.  She had a colonoscopy 2 years prior to the onset of the pain which revealed diverticulosis.  When she began experiencing these symptoms primary care physician presumptively and empirically prescribed ciprofloxacin for presumed diverticulitis.  This resulted in slight easing up with the pain but it never eliminated.  She then proceeded to have worsening pain in October 2019 and was seen in an urgent care where she was prescribed Flagyl for presumed diverticulitis.  Because of the persistence of  the pain a CT scan of the abdomen and pelvis was ordered on February 20, 2018 and this was performed at [facility] [place].  At the time of this initial evaluation only the CT scan report was available.  This revealed an incidental finding of a 1.5 cm subcapsular lesion in the lateral upper pole of the left kidney which measured higher than fluid attenuation which could represent a proteinaceous renal cyst or solid renal mass.  There was no ascites.  There is no lymphadenopathy.  There were no masses in the pelvis including ovarian or adnexal masses seen.  There was however soft tissue stranding seen with nodularity in the omental fat in the lower abdomen without evidence of ascites.  This was felt to represent either carcinomatosis or inflammatory infectious etiologies.  There was colonic diverticulosis seen without radiographic evidence of diverticulitis.  The radiologist recommended MRI of the abdomen to further work-up the renal mass.    In follow-up of the CT scan she was seen by Dr.Grewal ordered a Ca1 25 on 02/22/2018 which was elevated at 103.  She also performed a transvaginal ultrasound that same day which revealed a surgically absent uterus, and neither ovary was visualized due to diffuse bowel shadowing, but no adnexal masses or free fluid was seen.  The patient is otherwise fairly healthy 79 year old woman.  She has a history of one prior vaginal delivery complicated by pelvic organ relaxation.  She subsequently underwent a vaginal hysterectomy and then subsequently vaginal and abdominal Burch procedure and  rectal prolapse surgery to repair pelvic organ prolapse.  Her past medical history is mostly significant for interstitial cystitis and diverticulitis.  Her past surgical history is significant for vaginal surgery as stated above, her only abdominal surgery was an abdominal Burch procedure.  She lives with her husband who is of good health.  Her family history significant only for lung cancer and a  father who was a smoker.  On March 06, 2018 she underwent a diagnostic laparoscopy which revealed an omentum that was densely adherent to the anterior abdominal wall.  It did not appear grossly consistent with metastatic carcinoma and a biopsy was taken of this omentum.  There was a normal upper abdominal survey.  The pelvis could not be well evaluated due to omental adhesions.  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  As a result of this she was taken to the operating room for definitive debulking procedure on March 21, 2018.  This required an exploratory laparotomy with bilateral salpingo-oophorectomy, omentectomy, radical tumor debulking, ventral hernia repair.  Intraoperative findings were significant for abdominal wall masses consistent with port site metastases from her prior surgery which included a 7 cm lesion at the umbilical incision in the left lateral incision site lesion measuring 5 cm.  There is omental caking in the infracolic omentum but normal gastrocolic omentum.  There were grossly normal but very small fallopian tubes and ovaries.  The diaphragms were grossly normal.  There was no palpable lymphadenopathy.  The small and large intestine with the exception of miliary studding of tumor on the surface of the sigmoid colon and cul-de-sac and the rectum were grossly normal.  The resection represented R1 optimal cytoreduction with no gross visible disease remaining.  There was a thin rind of tumor on the lateral left fascia of the left abdominal wall at the port site metastasis that was residual.  Postop course was complicated by readmission for ileus and delayed onset of chemotherapy due to wound infection.  She commenced adjuvant cytotoxic chemotherapy with 6 cycles of paclitaxel and carboplatin on 06/02/18 and completed this on 09/20/18.  Ca1 25 prior to starting chemotherapy on May 15, 2018 was normal  at 11.6.  After completing chemotherapy Ca1 25 remains normal at 8.  Postop and post treatment CT scan imaging showed no gross residual disease and she transitioned into surveillance.   Interval Hx:  She returns today for evaluation as part of routine surveillance.  She reported some mild abdominal pains for which a CT scan of the abdomen and pelvis was performed on August 23, 2019.  This revealed a normal stomach, marked diffuse colonic diverticulosis, thickening of soft tissue planes between the cecum sigmoid colon and vaginal cuff in the deep pelvis which are unchanged from prior scan in January 2021.  No discrete peritoneal implants.  There was a small midline ventral supraumbilical hernia containing transverse colon present.  Ca1 25 drawn on August 23, 2019 was normal but elevated from prior, at 31.  Current Meds:  Outpatient Encounter Medications as of 08/31/2019  Medication Sig  . Biotin 1 MG CAPS Take 1 tablet by mouth daily.  . carboxymethylcellulose (REFRESH PLUS) 0.5 % SOLN Place 1 drop into both eyes 3 (three) times daily as needed (for dry eyes.).  Marland Kitchen Cholecalciferol (VITAMIN D3 SUPER STRENGTH) 50 MCG (2000 UT) TABS Take 2,000 Units by mouth daily.  Marland Kitchen linaclotide (LINZESS) 72 MCG capsule Take 1 capsule (72 mcg total) by mouth  daily before breakfast.  . lubiprostone (AMITIZA) 8 MCG capsule Take 1 capsule (8 mcg total) by mouth 2 (two) times daily with a meal.  . polyethylene glycol (MIRALAX) packet Take 17 g by mouth daily.  . predniSONE (DELTASONE) 50 MG tablet Take 1 tablet 13 hours prior to scan, 1 tab 7 hours prior, 1 tab 1 hour prior.  Also take OTC Benadryl 50mg  1 hour prior to scan.  . senna-docusate ([employer] SENNA PLUS) 8.6-50 MG tablet TAKE 1-2 TABLETS BY MOUTH AT BEDTIME *HOLD IF HAVING LOOSE STOOLS*  . triamcinolone cream (KENALOG) 0.5 % Apply 1 application topically 2 (two) times daily. Apply to eyelids/eyebrows  . diphenhydrAMINE (BENADRYL) 50 MG tablet Take 1 tablet (50 mg  total) by mouth once for 1 dose. Take 1 tablet (50 mg total) by mouth one hour prior to CT scan.  . Elastic Bandages & Supports (ABDOMINAL BINDER/ELASTIC MED) MISC 1 Device by Does not apply route daily as needed.   No facility-administered encounter medications on file as of 08/31/2019.    Allergy:  Allergies  Allergen Reactions  . Ivp Dye [Iodinated Diagnostic Agents] Anaphylaxis    Anaphylaxis and hives  . Propoxyphene Anaphylaxis, Shortness Of Breath and Swelling    swollen all over  . Codeine Nausea Only and Other (See Comments)    Headaches.  Marland Kitchen Ultram [Tramadol Hcl] Hives    Social Hx:   Social History   Socioeconomic History  . Marital status: Married    Spouse name: Not on file  . Number of children: Not on file  . Years of education: Not on file  . Highest education level: Not on file  Occupational History  . Not on file  Tobacco Use  . Smoking status: Never Smoker  . Smokeless tobacco: Never Used  Substance and Sexual Activity  . Alcohol use: Never  . Drug use: Never  . Sexual activity: Not on file  Other Topics Concern  . Not on file  Social History Narrative  . Not on file   Social Determinants of Health   Financial Resource Strain:   . Difficulty of Paying Living Expenses:   Food Insecurity:   . Worried About Charity fundraiser in the Last Year:   . Arboriculturist in the Last Year:   Transportation Needs:   . Film/video editor (Medical):   Marland Kitchen Lack of Transportation (Non-Medical):   Physical Activity:   . Days of Exercise per Week:   . Minutes of Exercise per Session:   Stress:   . Feeling of Stress :   Social Connections:   . Frequency of Communication with Friends and Family:   . Frequency of Social Gatherings with Friends and Family:   . Attends Religious Services:   . Active Member of Clubs or Organizations:   . Attends Archivist Meetings:   Marland Kitchen Marital Status:   Intimate Partner Violence:   . Fear of Current or Ex-Partner:    . Emotionally Abused:   Marland Kitchen Physically Abused:   . Sexually Abused:     Past Surgical Hx:  Past Surgical History:  Procedure Laterality Date  . Bladder Lift  2000  . COLONOSCOPY    . DEBULKING N/A 03/21/2018   Procedure: RADICAL TUMOR DEBULKING;  Surgeon: Everitt Amber, MD;  Location: [facility];  Service: Gynecology;  Laterality: N/A;  . EYE SURGERY  2017   Cataracts (Bilateral)  . IR IMAGING GUIDED PORT INSERTION  06/19/2018  . LAPAROSCOPIC SALPINGO OOPHERECTOMY  Bilateral 03/21/2018   Procedure: SALPINGO OOPHORECTOMY;  Surgeon: Everitt Amber, MD;  Location: [facility];  Service: Gynecology;  Laterality: Bilateral;  . LAPAROSCOPY N/A 03/06/2018   Procedure: LAPAROSCOPY DIAGNOSTIC OMENTAL BIOPSY;  Surgeon: Everitt Amber, MD;  Location: [facility];  Service: Gynecology;  Laterality: N/A;  . LAPAROTOMY N/A 03/21/2018   Procedure: EXPLORATORY LAPAROTOMY, INCISIONAL HERNIA REPAIR;  Surgeon: Everitt Amber, MD;  Location: [facility];  Service: Gynecology;  Laterality: N/A;  . OMENTECTOMY N/A 03/21/2018   Procedure: OMENTECTOMY;  Surgeon: Everitt Amber, MD;  Location: [facility];  Service: Gynecology;  Laterality: N/A;  . PARTIAL HYSTERECTOMY     40 years ago  . Rectum Lift  2001  . TONSILLECTOMY      Past Medical Hx:  Past Medical History:  Diagnosis Date  . Complication of anesthesia    Difficult to put to sleep  . Diverticulosis   . Family history of lung cancer   . Family history of lymphoma   . GERD (gastroesophageal reflux disease)   . ovarian ca dx'd 02/2018    Past Gynecological History:  No prior abnormal paps, hysterectomy age 71. Last pap normal 2019 (vaginal). No LMP recorded. Patient has had a hysterectomy.  Family Hx:  Family History  Problem Relation Age of Onset  . Diabetes Mother   . Stroke Mother   . Non-Hodgkin's lymphoma Brother 36       exp to agent orange  . Heart disease Maternal Grandmother   . Lung cancer Maternal Grandfather   . Tuberculosis Paternal  Grandmother     Review of Systems:  Constitutional  Feels well,  ENT Normal appearing ears and nares bilaterally Skin/Breast  No rash, sores, jaundice, itching, dryness Cardiovascular  No chest pain, shortness of breath, or edema  Pulmonary  No cough or wheeze.  Gastro Intestinal  + occasional abdominal pains Genito Urinary  No frequency, urgency, dysuria, Musculo Skeletal  + back pain Neurologic  + sciatica Psychology  No depression, anxiety, insomnia.   Vitals:  Blood pressure (!) 150/64, pulse 100, temperature 98.2 F (36.8 C), temperature source Temporal, resp. rate 20, height 5\' 4"  (1.626 m), weight 154 lb 9.6 oz (70.1 kg), SpO2 100 %.  Physical Exam: WD in NAD Neck  Supple NROM, without any enlargements.  Lymph Node Survey No cervical supraclavicular or inguinal adenopathy Cardiovascular  Pulse normal rate, regularity and rhythm. S1 and S2 normal.  Lungs  Clear to auscultation bilateraly, without wheezes/crackles/rhonchi. Good air movement.  Skin  No rash/lesions/breakdown  Psychiatry  Alert and oriented to person, place, and time  Abdomen  Normoactive bowel sounds, abdomen soft, non-tender and mildly obese. Incisions soft without palpable masses. No distension. Back No CVA tenderness Genito Urinary  Smooth vaginal walls, no pelvic masses Rectal  No posterior pelvic masses.  Extremities  No bilateral cyanosis, clubbing or edema.   Thereasa Solo, MD  08/31/2019, 2:48 PM

## 2019-08-31 NOTE — Patient Instructions (Signed)
Please notify Dr Denman George at phone number [phone] if you notice vaginal bleeding, new pelvic or abdominal pains, bloating, feeling full easy, or a change in bladder or bowel function.   Please contact Dr Serita Grit office (at [phone]) in July after your appointment with Dr Alvy Bimler to request an appointment with her for October/November.

## 2019-09-03 ENCOUNTER — Telehealth: Payer: Self-pay | Admitting: Oncology

## 2019-09-03 NOTE — Telephone Encounter (Signed)
Called [place] regarding genetic counseling.  Advised her that saw genetics and had testing done on 05/29/18.  Also went over her upcoming appointments in July.

## 2019-09-26 ENCOUNTER — Encounter: Payer: Self-pay | Admitting: Internal Medicine

## 2019-09-26 ENCOUNTER — Ambulatory Visit: Payer: Medicare HMO | Admitting: Internal Medicine

## 2019-09-26 VITALS — BP 126/72 | HR 92 | Ht 64.0 in | Wt 153.1 lb

## 2019-09-26 DIAGNOSIS — K5730 Diverticulosis of large intestine without perforation or abscess without bleeding: Secondary | ICD-10-CM

## 2019-09-26 DIAGNOSIS — K5904 Chronic idiopathic constipation: Secondary | ICD-10-CM

## 2019-09-26 NOTE — Progress Notes (Signed)
Subjective:    Patient ID: Misty Hopkins, female    DOB: October 16, 1940, 79 y.o.   MRN: [id number]  HPI Misty Hopkins is a 79 year old female with a history of ovarian cancer involving fallopian tube and omentum status post resection and chemotherapy, history of constipation, symptomatic diverticulosis and GERD who is here for follow-up.  She is here alone today and I saw her last in February 2021.  She has continued follow-up with Dr. Alvy Bimler and Dr. Denman George.  She had surveillance imaging in April, see objective.  Her malignancy is felt to be in remission though her CA1125 has been increasing slightly.  She is in active surveillance  Unfortunately Amitiza and Linzess were cost prohibitive and it does not appear that we were asked to do a prior authorization.  Her sister takes Linzess and it is covered.  She has been using castor oil and mineral oil which has helped with constipation but she has a little bit of bowel movements throughout the day and still feels incomplete bowel movements.  Senna was causing cramping and irritating discomfort in her lower abdomen after chronic use.  No blood in her stool or melena.   Review of Systems As per HPI, otherwise normal  Current Medications, Allergies, Past Medical History, Past Surgical History, Family History and Social History were reviewed in [employer] record.     Objective:   Physical Exam BP 126/72 (BP Location: Left Arm, Patient Position: Sitting, Cuff Size: Normal)   Pulse 92   Ht 5\' 4"  (1.626 m)   Wt 153 lb 2 oz (69.5 kg)   SpO2 97%   BMI 26.28 kg/m  Gen: awake, alert, NAD HEENT: anicteric CV: RRR Pulm: CTA b/l Abd: soft, umbilical hernia, ND, +BS throughout Ext: no c/c/e Neuro: nonfocal  CT ABDOMEN AND PELVIS WITH CONTRAST   TECHNIQUE: Multidetector CT imaging of the abdomen and pelvis was performed using the standard protocol following bolus administration of intravenous contrast.   CONTRAST:   161mL OMNIPAQUE IOHEXOL 300 MG/ML  SOLN   COMPARISON:  05/22/2019 CT abdomen/pelvis.   FINDINGS: Lower chest: No significant pulmonary nodules or acute consolidative airspace disease.   Hepatobiliary: Normal liver size. No liver mass. Normal gallbladder with no radiopaque cholelithiasis. No biliary ductal dilatation.   Pancreas: Normal, with no mass or duct dilation.   Spleen: Normal size. No mass.   Adrenals/Urinary Tract: Normal adrenals. No hydronephrosis. Exophytic hypodense 1.7 cm renal cortical lesion in the lateral upper left kidney is stable in size. Additional scattered subcentimeter hypodense renal cortical lesions in both kidneys are too small to characterize and are unchanged. No new renal lesions. Suggestion of a small cystocele. Otherwise normal bladder.   Stomach/Bowel: Normal non-distended stomach. Normal caliber small bowel with no small bowel wall thickening. Normal appendix. Oral contrast transits to the right colon. Small midline ventral supraumbilical hernia containing a small portion of the transverse colon, unchanged. Marked diffuse colonic diverticulosis, most prominent in the sigmoid colon, with no acute large bowel wall thickening or acute pericolonic fat stranding.   Vascular/Lymphatic: Atherosclerotic nonaneurysmal abdominal aorta. Patent portal, splenic, hepatic and renal veins. No pathologically enlarged lymph nodes in the abdomen or pelvis.   Reproductive: Hysterectomy and bilateral oophorectomy. Thickening of the soft tissue planes between the cecum, sigmoid colon and vaginal cuff in the deep pelvis (series 2/image 68 and 69), unchanged. No discrete peritoneal implants.   Other: No pneumoperitoneum, ascites or focal fluid collection.   Musculoskeletal: No aggressive appearing focal osseous  lesions. Moderate thoracolumbar spondylosis.   IMPRESSION: 1. Stable CT. No lymphadenopathy or other specific findings of recurrent metastatic  disease. 2. Thickening of soft tissue planes in the deep pelvis is unchanged and nonspecific, probably due to post treatment/postsurgical change given the stability. 3. Stable small midline ventral supraumbilical hernia containing a small portion of the transverse colon. No evidence of bowel obstruction or acute bowel inflammation. Marked diffuse colonic diverticulosis, with no evidence of acute diverticulitis. 4. Suggestion of a small cystocele. 5. Aortic Atherosclerosis (ICD10-I70.0).     Electronically Signed   By: Ilona Sorrel M.D.   On: 08/23/2019 12:50     Assessment & Plan:  79 year old female with a history of ovarian cancer involving fallopian tube and omentum status post resection and chemotherapy, history of constipation, symptomatic diverticulosis and GERD who is here for follow-up.   1.  Chronic constipation/symptomatic diverticular disease of the colon --her colonic diverticulosis and anatomy are the predominant contributing factors to her chronic constipation.  We have again discussed this today.  I think we may have potential through prior authorization to have Linzess approved and make it affordable.  I gave her samples of both the 145 mcg in the 290 mcg dose which she will try over the next 4 weeks to see if this would be more effective.  She will start with Linzess 145 mcg daily, 30 minutes before breakfast and if ineffective she will try the higher dose.  She is asked to notify me should she develop crampy abdominal pain, diarrhea or other problems with medication.  She voices understanding  2.  CRC screening --colonoscopy up-to-date with normal colonoscopy except severe diverticulosis in July 2020  3.  Carcinoma of the right fallopian tube --active surveillance with Dr. Alvy Bimler and Denman George  30 minutes total spent today including patient facing time, coordination of care, reviewing medical history/procedures/pertinent radiology studies, and documentation of the  encounter.

## 2019-09-26 NOTE — Patient Instructions (Signed)
Try the samples of Linzess. Let us know which dosage works better for you. Once you let us know that, we will send a script to your pharmacy and will need to try to get this authorized through your insurance company at that time.  If you are age 77 or older, your body mass index should be between 23-30. Your Body mass index is 26.28 kg/m. If this is out of the aforementioned range listed, please consider follow up with your Primary Care Provider.  If you are age 77 or younger, your body mass index should be between 19-25. Your Body mass index is 26.28 kg/m. If this is out of the aformentioned range listed, please consider follow up with your Primary Care Provider.

## 2019-10-05 ENCOUNTER — Other Ambulatory Visit: Payer: Self-pay

## 2019-10-05 ENCOUNTER — Telehealth: Payer: Self-pay | Admitting: Oncology

## 2019-10-05 ENCOUNTER — Other Ambulatory Visit: Payer: Self-pay | Admitting: Internal Medicine

## 2019-10-05 MED ORDER — LINACLOTIDE 145 MCG PO CAPS: 145.0000 ug | ORAL_CAPSULE | Freq: Every day | ORAL | 1 refills | Status: DC

## 2019-10-05 NOTE — Telephone Encounter (Signed)
Linzess 145 mcg #30 1 refill, has been sent to the pharmacy. Pt will call the pharmacy for co-pay coverage. Sounds like she needs a prior authorization.

## 2019-10-05 NOTE — Telephone Encounter (Signed)
Pt states that Linzess 145 mg works well and she would like prescription [facility] to [facility] [address] in [place]. She would like to know how much her copay would be first, she stated that we told her that we could check this for her.

## 2019-10-05 NOTE — Telephone Encounter (Signed)
Misty Hopkins called and said she saw her PCP yesterday and they are recommending a bone density test.  She thinks she has had once recently but can't remember where she had it done.  Advised her that we do not have a record of it in the [facility].  She verbalized understanding.

## 2019-10-15 ENCOUNTER — Telehealth: Payer: Self-pay | Admitting: Internal Medicine

## 2019-10-15 MED ORDER — LUBIPROSTONE 8 MCG PO CAPS
8.0000 ug | ORAL_CAPSULE | Freq: Two times a day (BID) | ORAL | 1 refills | Status: DC
Start: 2019-10-15 — End: 2020-03-08

## 2019-10-15 NOTE — Telephone Encounter (Signed)
Left message for patient to call back. I have attempted a prior authorization. However, I get a message indicating "the patient currently has access to the requested medication and a Prior Authorization is not needed for the patient/medication."

## 2019-10-15 NOTE — Telephone Encounter (Signed)
I have spoken to patient to advise to take miralax 1-2 times daily in the short term. I have filled out paperwork for Linzess patient assistance and have mailed out the remainder for patient to complete her portion.

## 2019-10-15 NOTE — Telephone Encounter (Signed)
She can try MiraLAX 17 g once to twice daily if she has not already.  She should in the meantime also apply for Linzess patient assistance

## 2019-10-15 NOTE — Telephone Encounter (Signed)
After attempting prior auth, Misty Hopkins actually is not a covered medication either. Should she try an over the counter product? I can have her try for Linzess patient assistance but in the meantime, she may need an alternative.Marland KitchenMarland KitchenMarland Kitchen

## 2019-10-15 NOTE — Telephone Encounter (Signed)
Yes, okay to Amitiza 8 mcg BID with food If not effective may need the higher, 24 mcg, dose She should keep me updated

## 2019-10-15 NOTE — Telephone Encounter (Signed)
Patient called to follow up on the medication prior auth that was mentioned to her.

## 2019-10-15 NOTE — Telephone Encounter (Signed)
Patient indicates that her Linzess is going to be over 300 dollars per month. This is despite the fact that she does not need a prior authorization per covermymeds.com.  Can we try for Amitiza in hopes that it gives better coverage and is more cost effective? Also, if not appropriate or not cost effective, what would your next recommendation be?

## 2019-10-17 ENCOUNTER — Telehealth: Payer: Self-pay | Admitting: Oncology

## 2019-10-17 NOTE — Telephone Encounter (Signed)
Misty Hopkins called and said she has been having lower back pain, more on the left side. She saw her PCP, Dr. Jenean Lindau on 10/04/19 and had an xray which showed "moderate sacroiliitis." Her PCP recommended a referral to orthopedic surgery if her pain does not improve.  Misty Hopkins said her pain has not improved and she recently has had to take pain pills she had left over from treatment.  She is wondering if Dr. Alvy Bimler could recommend an orthopedic surgeon in [place].

## 2019-10-17 NOTE — Telephone Encounter (Signed)
No, I do not have specific orthopedic surgeon to recommend here I suggest she discuss this with her PCP

## 2019-10-17 NOTE — Telephone Encounter (Signed)
Called Misty Hopkins and advised her of message from Dr. Alvy Bimler.  She verbalized understanding.

## 2019-10-25 ENCOUNTER — Other Ambulatory Visit: Payer: Self-pay

## 2019-10-25 ENCOUNTER — Ambulatory Visit (INDEPENDENT_AMBULATORY_CARE_PROVIDER_SITE_OTHER): Payer: Medicare HMO

## 2019-10-25 ENCOUNTER — Ambulatory Visit (INDEPENDENT_AMBULATORY_CARE_PROVIDER_SITE_OTHER): Payer: Medicare HMO | Admitting: Orthopaedic Surgery

## 2019-10-25 ENCOUNTER — Encounter: Payer: Self-pay | Admitting: Orthopaedic Surgery

## 2019-10-25 VITALS — BP 141/79 | HR 81 | Ht 63.0 in | Wt 153.0 lb

## 2019-10-25 DIAGNOSIS — G8929 Other chronic pain: Secondary | ICD-10-CM | POA: Diagnosis not present

## 2019-10-25 DIAGNOSIS — M545 Low back pain, unspecified: Secondary | ICD-10-CM

## 2019-10-25 NOTE — Progress Notes (Signed)
Office Visit Note   Patient: Misty Hopkins           Date of Birth: 08-28-1940           MRN: [id number] Visit Date: 10/25/2019              Requested by: Allie Dimmer, [place] [address] PCP: Allie Dimmer, MD   Assessment & Plan: Visit Diagnoses:  1. Chronic bilateral low back pain, unspecified whether sciatica present     Plan: Patient has least 6 CT scans abdomen and pelvis available for review that are occurring every 3 to 6 months based on her CA 125 blood levels.  This does show some asymmetrical disc narrowing at L4-5 some facet arthropathy.  Disc base narrowing upper lumbar all consistent with some lumbar disc degeneration without evidence spinal stenosis.  Patient has no radiculopathy she is still extremely active doing flowering household duties washing windows etc.  She gets increased symptoms with increased radicular symptoms or neurogenic claudication symptoms she can return which we discussed in detail with patient and her husband.  No additional diagnostic tests at this point.  She did relate that she has problems with claustrophobia and if MRI scan lumbar is ordered she did need some Valium.  Follow-Up Instructions: No follow-ups on file.   Orders:  Orders Placed This Encounter  Procedures  . XR Lumbar Spine 2-3 Views   No orders of the defined types were placed in this encounter.     Procedures: No procedures performed   Clinical Data: No additional findings.   Subjective: Chief Complaint  Patient presents with  . 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  She has more pain on the left side lumbosacral junction patient has some Norco tablets but really has not taken much she is tried Tylenol it really has not helped.  No associated bowel or bladder symptoms.  She had some muscle resected anteriorly  with a tumor that she had that also involve the omentum.  Patient's been active and states her back full sore because she has been washing windows this week.  She is able to ambulate she denies neurogenic claudication symptoms.  Patient is here with her husband.  Review of Systems 14 point systems negative other than as mentioned HPI.     Objective: Vital Signs: BP (!) 141/79   Pulse 81   Ht 5\' 3"  (1.6 m)   Wt 153 lb (69.4 kg)   BMI 27.10 kg/m   Physical Exam Constitutional:      Appearance: She is well-developed.  HENT:     Head: Normocephalic.     Right Ear: External ear normal.     Left Ear: External ear normal.  Eyes:     Pupils: Pupils are equal, round, and reactive to light.  Neck:     Thyroid: No thyromegaly.     Trachea: No tracheal deviation.  Cardiovascular:     Rate and Rhythm: Normal rate.  Pulmonary:     Effort: Pulmonary effort is normal.  Abdominal:     Palpations: Abdomen is soft.  Skin:    General: Skin is warm and dry.  Neurological:     Mental Status: She is alert and oriented to person, place, and time.  Psychiatric:        Behavior: Behavior normal.  Ortho Exam patient's first few steps are with hips flexed.  She is able to heel and toe walk anterior tib gastrocsoleus is intact reflexes are intact.  She has some tenderness lumbosacral junction 1 the left and right side.  Negative logroll right and left hip.  Knees reach full extension no popliteal tenderness.  Specialty Comments:  No specialty comments available.  Imaging: No results found.   PMFS History: Patient Active Problem List   Diagnosis Date Noted  . Diverticulosis of colon 06/12/2019  . Left ulnar fracture 12/06/2018  . Acute back pain with sciatica 10/26/2018  . Arthralgia 08/28/2018  . Elevated BP without diagnosis of hypertension 08/28/2018  . Genetic testing 06/15/2018  . Other constipation 06/08/2018  . 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  Family history of lymphoma   . Family history of lung cancer   . 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 Ovarian cancer ([place]) 03/21/2018  . Omental mass 03/01/2018   Past Medical History:  Diagnosis Date  . Complication of anesthesia    Difficult to put to sleep  . Diverticulosis   . Family history of lung cancer   . Family history of lymphoma   . GERD (gastroesophageal reflux disease)   . ovarian ca dx'd 02/2018    Family History  Problem Relation Age of Onset  . Diabetes Mother   . Stroke Mother   . Non-Hodgkin's lymphoma Brother 54       exp to agent orange  . Heart disease Maternal Grandmother   . Lung cancer Maternal Grandfather   . Tuberculosis Paternal Grandmother     Past Surgical History:  Procedure Laterality Date  . Bladder Lift  2000  . COLONOSCOPY    . DEBULKING N/A 03/21/2018   Procedure: RADICAL TUMOR DEBULKING;  Surgeon: Everitt Amber, MD;  Location: [facility];  Service: Gynecology;  Laterality: N/A;  . EYE SURGERY  2017   Cataracts (Bilateral)  . IR IMAGING GUIDED PORT INSERTION  06/19/2018  . LAPAROSCOPIC SALPINGO OOPHERECTOMY Bilateral 03/21/2018   Procedure: SALPINGO OOPHORECTOMY;  Surgeon: Everitt Amber, MD;  Location: [facility];  Service: Gynecology;  Laterality: Bilateral;  . LAPAROSCOPY N/A 03/06/2018   Procedure: LAPAROSCOPY DIAGNOSTIC OMENTAL BIOPSY;  Surgeon: Everitt Amber, MD;  Location: [facility];  Service: Gynecology;  Laterality: N/A;  . LAPAROTOMY N/A 03/21/2018   Procedure: EXPLORATORY LAPAROTOMY, INCISIONAL HERNIA REPAIR;  Surgeon: Everitt Amber, MD;  Location: [facility];  Service: Gynecology;  Laterality: N/A;  . OMENTECTOMY N/A 03/21/2018   Procedure: OMENTECTOMY;  Surgeon: Everitt Amber, MD;  Location: [facility];  Service: Gynecology;  Laterality: N/A;  . PARTIAL HYSTERECTOMY     40 years ago  . Rectum Lift  2001  . TONSILLECTOMY     Social History   Occupational History  . Not on file  Tobacco Use  . Smoking  status: Never Smoker  . Smokeless tobacco: Never Used  Vaping Use  . Vaping Use: Never used  Substance and Sexual Activity  . Alcohol use: Never  . Drug use: Never  . Sexual activity: Not on file

## 2019-10-25 NOTE — Addendum Note (Signed)
Addended by: Meyer Cory on: 10/25/2019 04:18 PM   Modules accepted: Orders

## 2019-10-26 ENCOUNTER — Inpatient Hospital Stay: Payer: Medicare HMO

## 2019-10-26 ENCOUNTER — Inpatient Hospital Stay: Payer: Medicare HMO | Attending: Hematology and Oncology | Admitting: Hematology and Oncology

## 2019-10-26 ENCOUNTER — Encounter: Payer: Self-pay | Admitting: Hematology and Oncology

## 2019-10-26 ENCOUNTER — Other Ambulatory Visit: Payer: Self-pay

## 2019-10-26 DIAGNOSIS — K439 Ventral hernia without obstruction or gangrene: Secondary | ICD-10-CM | POA: Diagnosis not present

## 2019-10-26 DIAGNOSIS — C561 Malignant neoplasm of right ovary: Secondary | ICD-10-CM | POA: Diagnosis not present

## 2019-10-26 DIAGNOSIS — Z7952 Long term (current) use of systemic steroids: Secondary | ICD-10-CM | POA: Diagnosis not present

## 2019-10-26 DIAGNOSIS — M5430 Sciatica, unspecified side: Secondary | ICD-10-CM | POA: Diagnosis not present

## 2019-10-26 DIAGNOSIS — C5701 Malignant neoplasm of right fallopian tube: Secondary | ICD-10-CM | POA: Diagnosis present

## 2019-10-26 DIAGNOSIS — I700 Atherosclerosis of aorta: Secondary | ICD-10-CM | POA: Diagnosis not present

## 2019-10-26 DIAGNOSIS — C569 Malignant neoplasm of unspecified ovary: Secondary | ICD-10-CM

## 2019-10-26 DIAGNOSIS — K5730 Diverticulosis of large intestine without perforation or abscess without bleeding: Secondary | ICD-10-CM | POA: Insufficient documentation

## 2019-10-26 DIAGNOSIS — Z885 Allergy status to narcotic agent status: Secondary | ICD-10-CM | POA: Insufficient documentation

## 2019-10-26 DIAGNOSIS — K5909 Other constipation: Secondary | ICD-10-CM | POA: Diagnosis not present

## 2019-10-26 DIAGNOSIS — R030 Elevated blood-pressure reading, without diagnosis of hypertension: Secondary | ICD-10-CM | POA: Diagnosis not present

## 2019-10-26 DIAGNOSIS — M549 Dorsalgia, unspecified: Secondary | ICD-10-CM | POA: Insufficient documentation

## 2019-10-26 DIAGNOSIS — Z90722 Acquired absence of ovaries, bilateral: Secondary | ICD-10-CM | POA: Insufficient documentation

## 2019-10-26 DIAGNOSIS — Z79899 Other long term (current) drug therapy: Secondary | ICD-10-CM | POA: Insufficient documentation

## 2019-10-26 DIAGNOSIS — M5137 Other intervertebral disc degeneration, lumbosacral region: Secondary | ICD-10-CM | POA: Diagnosis not present

## 2019-10-26 DIAGNOSIS — M5440 Lumbago with sciatica, unspecified side: Secondary | ICD-10-CM

## 2019-10-26 DIAGNOSIS — M48061 Spinal stenosis, lumbar region without neurogenic claudication: Secondary | ICD-10-CM | POA: Insufficient documentation

## 2019-10-26 DIAGNOSIS — Z9221 Personal history of antineoplastic chemotherapy: Secondary | ICD-10-CM | POA: Insufficient documentation

## 2019-10-26 DIAGNOSIS — M5136 Other intervertebral disc degeneration, lumbar region: Secondary | ICD-10-CM | POA: Diagnosis not present

## 2019-10-26 LAB — CMP (CANCER CENTER ONLY)
ALT: 17 U/L (ref 0–44)
AST: 17 U/L (ref 15–41)
Albumin: 3.8 g/dL (ref 3.5–5.0)
Alkaline Phosphatase: 56 U/L (ref 38–126)
Anion gap: 12 (ref 5–15)
BUN: 20 mg/dL (ref 8–23)
CO2: 24 mmol/L (ref 22–32)
Calcium: 9.3 mg/dL (ref 8.9–10.3)
Chloride: 108 mmol/L (ref 98–111)
Creatinine: 0.74 mg/dL (ref 0.44–1.00)
GFR, Est AFR Am: >60 mL/min (ref >60)
GFR, Estimated: >60 mL/min (ref >60)
Glucose, Bld: 88 mg/dL (ref 70–99)
Potassium: 4.6 mmol/L (ref 3.5–5.1)
Sodium: 144 mmol/L (ref 135–145)
Total Bilirubin: 0.3 mg/dL (ref 0.3–1.2)
Total Protein: 6.9 g/dL (ref 6.5–8.1)

## 2019-10-26 LAB — CBC WITH DIFFERENTIAL (CANCER CENTER ONLY)
Abs Immature Granulocytes: 0.03 10*3/uL (ref 0.00–0.07)
Basophils Absolute: 0 10*3/uL (ref 0.0–0.1)
Basophils Relative: 0 %
Eosinophils Absolute: 0.1 10*3/uL (ref 0.0–0.5)
Eosinophils Relative: 1 %
HCT: 39.3 % (ref 36.0–46.0)
Hemoglobin: 12.5 g/dL (ref 12.0–15.0)
Immature Granulocytes: 0 %
Lymphocytes Relative: 30 %
Lymphs Abs: 2.7 10*3/uL (ref 0.7–4.0)
MCH: 28.3 pg (ref 26.0–34.0)
MCHC: 31.8 g/dL (ref 30.0–36.0)
MCV: 88.9 fL (ref 80.0–100.0)
Monocytes Absolute: 0.8 10*3/uL (ref 0.1–1.0)
Monocytes Relative: 9 %
Neutro Abs: 5.4 10*3/uL (ref 1.7–7.7)
Neutrophils Relative %: 60 %
Platelet Count: 192 10*3/uL (ref 150–400)
RBC: 4.42 MIL/uL (ref 3.87–5.11)
RDW: 13.9 % (ref 11.5–15.5)
WBC Count: 9 10*3/uL (ref 4.0–10.5)
nRBC: 0 % (ref 0.0–0.2)

## 2019-10-26 MED ORDER — HEPARIN SOD (PORK) LOCK FLUSH 100 UNIT/ML IV SOLN
500.0000 [IU] | Freq: Once | INTRAVENOUS | Status: AC
  Administered 2019-10-26: 500 [IU]
  Filled 2019-10-26: qty 5

## 2019-10-26 MED ORDER — SODIUM CHLORIDE 0.9% FLUSH
10.0000 mL | Freq: Once | INTRAVENOUS | Status: AC
  Administered 2019-10-26: 10 mL
  Filled 2019-10-26: qty 10

## 2019-10-26 NOTE — Assessment & Plan Note (Addendum)
She was evaluated by orthopedic surgeon and was recommended ibuprofen She has some symptomatic relief, however, complicated by some gastritis I recommend she takes ibuprofen with food I will defer to the orthopedic doctor for further management

## 2019-10-26 NOTE — Assessment & Plan Note (Signed)
She continues to have intermittent sensation of incomplete bowel emptying She was referred to see gastroenterologist She will continue laxatives as needed

## 2019-10-26 NOTE — Progress Notes (Signed)
[facility] OFFICE PROGRESS NOTE  Patient Care Team: Allie Dimmer, MD as PCP - General (Internal Medicine) [name], RN as Oncology Nurse Navigator (Oncology)  ASSESSMENT & PLAN:  Fallopian tube cancer, carcinoma, right [facility]) Her examination is benign and her tumor marker is pending I will call her with test results next week If CA-125 is elevated, we will repeat CT imaging Her last CT imaging showed no evidence of cancer We will continue close monitoring and follow-up She is scheduled to see me in 6 months I will get her an appointment to see GYN surgeon in 3 months We will continue port maintenance and blood work monitoring  Acute back pain with sciatica She was evaluated by orthopedic surgeon and was recommended ibuprofen She has some symptomatic relief, however, complicated by some gastritis I recommend she takes ibuprofen with food I will defer to the orthopedic doctor for further management  Elevated BP without diagnosis of hypertension She has acute elevated blood pressure likely secondary to anxiety Observe for now  Diverticulosis of colon She continues to have intermittent sensation of incomplete bowel emptying She was referred to see gastroenterologist She will continue laxatives as needed   No orders of the defined types were placed in this encounter.   All questions were answered. The patient knows to call the clinic with any problems, questions or concerns. The total time spent in the appointment was 20 minutes encounter with patients including review of chart and various tests results, discussions about plan of care and coordination of care plan   [name], MD 10/26/2019 11:18 AM  INTERVAL HISTORY: Please see below for problem oriented charting. She returns for further follow-up She was referred to see orthopedic doctor for chronic back pain and was recommended Aleve She takes Aleve twice a day This morning, she take it with an empty  stomach and has some gastritis She continues to have sensation of incomplete bowel emptying She was seen by GI service and was recommended a medication that caused several thousands of dollars which she cannot afford She is reluctant to apply for medical assistant program due to financial privacy Denies abdominal bloating, nausea or abdominal pain  SUMMARY OF ONCOLOGIC HISTORY: Oncology History Overview Note  High grade serous, right fallopian tube  HRD, BRCA tumor testing were neg   Ovarian cancer ([place])  03/21/2018 Initial Diagnosis   Ovarian cancer ([place])   Fallopian tube cancer, carcinoma, right ([place])  10/24/2017 Initial Diagnosis   She has presentation of vague abdominal pain   02/20/2018 Imaging   Outside CT abdomen and pelvis: This revealed an incidental finding of a 1.5 cm subcapsular lesion in the lateral upper pole of the left kidney which measured higher than fluid attenuation which could represent a proteinaceous renal cyst or solid renal mass.  There was no ascites.  There is no lymphadenopathy.  There were no masses in the pelvis including ovarian or adnexal masses seen.  There was however soft tissue stranding seen with nodularity in the omental fat in the lower abdomen without evidence of ascites.  This was felt to represent either carcinomatosis or inflammatory infectious etiologies.  There was colonic diverticulosis seen without radiographic evidence of diverticulitis.  The radiologist recommended MRI of the abdomen to further work-up the renal mass   02/22/2018 Tumor Marker   Patient's tumor was tested for the following markers: CA-125. Results of the tumor marker test revealed 103.   03/06/2018 Pathology Results   Omentum, biopsy - ADENOCARCINOMA WITH  PSAMMOMA BODIES, SEE COMMENT. Microscopic Comment There are scattered small foci of malignant glands with associated psammoma bodies. While the tumor is somewhat limited the cells have a more low grade appearance. There is  prominent lymphovascular invasion. Immunohistochemistry reveals the cells are positive for cytokeratin 7, ER, MOC31, PAX8, and WT-1. They are negative for calretinin, cytokeratin 20, CDX-2, and TTF-1. Overall, the findings are consistent with adenocarcinoma. The differential includes a primary gynecologic or peritoneal tumor, although the morphology is not typical of a high grade serous carcinoma.   03/06/2018 Surgery   Surgeon: Donaciano Eva   Pre-operative Diagnosis: omental mass, elevated CA 125 Operation: Laparoscopic omentectomy  Surgeon: Donaciano Eva  Operative Findings:  : normal diaphragm and upper omentum. Sigmoid colon densely adherent to left pelvic side wall consistent with history of diverticulitis. Omentum adherent to anterior abdominal wall and sigmoid colon. Surgically absent uterus. Right ovary very small and grossly normal. Left ovary obscured by adhesed sigmoid colon. No ascites. No carcinomatosis. There was a nodular firm distal omentum on the left which was adherent to the sigmoid colon and most consistent with inflammatory change.      03/21/2018 Pathology Results   1. Soft tissue mass, simple excision, midline abdominal wall mass - METASTATIC SEROUS CARCINOMA. 2. Omentum, resection for tumor - METASTATIC SEROUS CARCINOMA. 3. Adnexa - ovary +/- tube, neoplastic, right - RIGHT FALLOPIAN TUBE: -HIGH GRADE SEROUS CARCINOMA. -SEE ONCOLOGY TABLE. - RIGHT OVARY: SURFACE PSAMMOMA BODIES. NO DEFINITIVE MALIGNANT CELLS. -INCLUSION CYSTS. 4. Adnexa - ovary +/- tube, neoplastic, left - LEFT FALLOPIAN TUBE: LUMINAL AND SURFACE SEROUS CARCINOMA. - LEFT OVARY: FOCAL PSAMMOMA BODIES AND MALIGNANT CELLS. 5. Soft tissue mass, simple excision, left abdominal wall - METASTATIC SEROUS CARCINOMA. Microscopic Comment 3. OVARY or FALLOPIAN TUBE or PRIMARY PERITONEUM: Procedure: Bilateral salpingo-oophorectomy. Abdominal wall mass excision and  omentectomy. Specimen Integrity: Intact. Tumor Site: Right fallopian tube. Ovarian Surface Involvement (required only if applicable): Present (left). Fallopian Tube Surface Involvement (required only if applicable): Present. Tumor Size: 1.5 cm. Histologic Type: High grade serous carcinoma. Histologic Grade: High grade. Implants (required for advanced stage serous/seromucinous borderline tumors only): Present. Other Tissue/ Organ Involvement: Omentum, abdominal wall, left fallopian tube and ovary. Largest Extrapelvic Peritoneal Focus (required only if applicable): >2 cm. Peritoneal/Ascitic Fluid: N/A. Treatment Effect (required only for high-grade serous carcinomas): N/A. Regional Lymph Nodes: No lymph nodes submitted or found Pathologic Stage Classification (pTNM, AJCC 8th Edition): pT3c, pNX Representative Tumor Block: 3A Comment(s): None.   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   Surgeon: Thereasa Solo, MD. Specimens: Bilateral tubes / ovaries, omentum. Midline abdominal wall mass, left lateral abdominal wall mass.    Operative Findings:  Abdominal wall masses consistent with port site metastases at the umbilical incision (7cm) and left lateral incision (5cm). Omental caking in the infracolic omentum. Grossly normal very small tubes and ovaries. Normal diaphragms. No lymphadenopathy. Normal small and large intestine with the exception of miliary studding of tumor on the surface of the sigmoid colon and rectum in the cul de sac.    This represented an optimal cytoreduction (R1) with no gross visible disease remaining, however there was a thin rind of tumor on the lateral left fascia associated with the port site metastasis.     04/11/2018 Cancer Staging    Staging form: Ovary, Fallopian Tube, and  Primary Peritoneal Carcinoma, AJCC 8th Edition - Pathologic: FIGO Stage IIIC (pT3, pN0, cM0) - Signed by [name], MD on 04/11/2018   05/23/2018 Imaging   1. Interval resolution of the anterior midline abdominal wall seroma. There is some persistent wispy soft tissue attenuation in the region of the midline incision, nonspecific and may simply reflect granulation tissue from surgery. 2. Stable 13 mm exophytic lesion posterior left kidney with attenuation higher than expected for a simple cyst. This may be a cyst complicated by proteinaceous debris or hemorrhage, but attention on follow-up recommended. 3. Stable mild persistent distention of proximal jejunal loops with gradual tapering to nondilated distal small bowel. 4. No overt peritoneal or mesenteric disease identified on this exam.   05/23/2018 Tumor Marker   Patient's tumor was tested for the following markers: CA-125 Results of the tumor marker test revealed 11.6   06/02/2018 - 09/20/2018 Chemotherapy   The patient had carboplatin and taxol   06/19/2018 Procedure   Status post right IJ port catheter placement. Catheter ready for use.   06/26/2018 Tumor Marker   Patient's tumor was tested for the following markers: CA-125. Results of the tumor marker test revealed 10.7   09/20/2018 Tumor Marker   Patient's tumor was tested for the following markers: CA-125. Results of the tumor marker test revealed 9.1   10/25/2018 Imaging   1. There is suspicious, abnormal asymmetric increased soft tissue involving the cecum and ascending colon. This is suspicious for residual/progressive serosal involvement by tumor. 2. No additional sites of disease identified. No evidence for nodal metastasis, solid organ metastasis or ascites.     11/07/2018 Procedure   She had negative colonoscopy evaluation   12/05/2018 Tumor Marker   Patient's tumor was tested for the following markers: CA-125 Results of the tumor  marker test revealed 8.6   01/25/2019 Imaging   Ct abdomen and pelvis Signs of omentectomy and hysterectomy without definitive signs of residual recurrent disease. Small nodular focus bridging rectum and vagina is stable dating back January to 12/14/2018, potentially postoperative but suggest attention on subsequent imaging.   01/25/2019 Tumor Marker   Patient's tumor was tested for the following markers: CA-125 Results of the tumor marker test revealed 10.3   03/15/2019 Tumor Marker   Patient's tumor was tested for the following markers: CA-125 Results of the tumor marker test revealed 9.6   03/23/2019 Imaging   No acute findings in the abdomen or pelvis.   Colonic diverticulosis.  No active diverticulitis.   Prior open tectum E and hysterectomy.   Aortic atherosclerosis.     05/10/2019 Tumor Marker   Patient's tumor was tested for the following markers: CA-125 Results of the tumor marker test revealed 11.8   05/22/2019 Imaging   1. Stable exam. No evidence of recurrent or metastatic carcinoma within the abdomen or pelvis. 2. Colonic diverticulosis, without radiographic evidence of diverticulitis.       REVIEW OF SYSTEMS:   Constitutional: Denies fevers, chills or abnormal weight loss Eyes: Denies blurriness of vision Ears, nose, mouth, throat, and face: Denies mucositis or sore throat Respiratory: Denies cough, dyspnea or wheezes Cardiovascular: Denies palpitation, chest discomfort or lower extremity swelling Skin: Denies abnormal skin rashes Lymphatics: Denies new lymphadenopathy or easy bruising Neurological:Denies numbness, tingling or new weaknesses Behavioral/Psych: Mood is stable, no new changes  All other systems were reviewed with the patient and are negative.  I have reviewed the past medical history, past surgical history, social history and family history with the patient and  they are unchanged from previous note.  ALLERGIES:  is allergic to ivp dye  [iodinated diagnostic agents], propoxyphene, codeine, and ultram [tramadol hcl].  MEDICATIONS:  Current Outpatient Medications  Medication Sig Dispense Refill  . ibuprofen (ADVIL) 400 MG tablet Take 400 mg by mouth 2 (two) times daily.    . Biotin 1 MG CAPS Take 1 tablet by mouth daily.    . carboxymethylcellulose (REFRESH PLUS) 0.5 % SOLN Place 1 drop into both eyes 3 (three) times daily as needed (for dry eyes.).    Marland Kitchen Cholecalciferol (VITAMIN D3 SUPER STRENGTH) 50 MCG (2000 UT) TABS Take 2,000 Units by mouth daily.    . Elastic Bandages & Supports (ABDOMINAL BINDER/ELASTIC MED) MISC 1 Device by Does not apply route daily as needed. 1 each 1  . lubiprostone (AMITIZA) 8 MCG capsule Take 1 capsule (8 mcg total) by mouth 2 (two) times daily with a meal. Pharmacy- d/c rx for linzess. Not covered well by insurance 60 capsule 1  . polyethylene glycol (MIRALAX / GLYCOLAX) 17 g packet Take 17 g by mouth daily as needed. (Patient not taking: Reported on 10/25/2019)    . triamcinolone cream (KENALOG) 0.5 % Apply 1 application topically 2 (two) times daily. Apply to eyelids/eyebrows  4   No current facility-administered medications for this visit.    PHYSICAL EXAMINATION: ECOG PERFORMANCE STATUS: 1 - Symptomatic but completely ambulatory  Vitals:   10/26/19 1107  BP: (!) 158/80  Pulse: 69  Resp: 18  Temp: 97.9 F (36.6 C)  SpO2: 98%   Filed Weights   10/26/19 1107  Weight: 156 lb (70.8 kg)    GENERAL:alert, no distress and comfortable SKIN: skin color, texture, turgor are normal, no rashes or significant lesions EYES: normal, Conjunctiva are pink and non-injected, sclera clear OROPHARYNX:no exudate, no erythema and lips, buccal mucosa, and tongue normal  NECK: supple, thyroid normal size, non-tender, without nodularity LYMPH:  no palpable lymphadenopathy in the cervical, axillary or inguinal LUNGS: clear to auscultation and percussion with normal breathing effort HEART: regular rate &  rhythm and no murmurs and no lower extremity edema ABDOMEN:abdomen soft, non-tender and normal bowel sounds.  Noted well-healed surgical scar Musculoskeletal:no cyanosis of digits and no clubbing  NEURO: alert & oriented x 3 with fluent speech, no focal motor/sensory deficits  LABORATORY DATA:  I have reviewed the data as listed    Component Value Date/Time   NA 144 10/26/2019 1041   K 4.6 10/26/2019 1041   CL 108 10/26/2019 1041   CO2 24 10/26/2019 1041   GLUCOSE 88 10/26/2019 1041   BUN 20 10/26/2019 1041   CREATININE 0.74 10/26/2019 1041   CALCIUM 9.3 10/26/2019 1041   PROT 6.9 10/26/2019 1041   ALBUMIN 3.8 10/26/2019 1041   AST 17 10/26/2019 1041   ALT 17 10/26/2019 1041   ALKPHOS 56 10/26/2019 1041   BILITOT 0.3 10/26/2019 1041   GFRNONAA >60 10/26/2019 1041   GFRAA >60 10/26/2019 1041    No results found for: SPEP, UPEP  Lab Results  Component Value Date   WBC 9.0 10/26/2019   NEUTROABS 5.4 10/26/2019   HGB 12.5 10/26/2019   HCT 39.3 10/26/2019   MCV 88.9 10/26/2019   PLT 192 10/26/2019      Chemistry      Component Value Date/Time   NA 144 10/26/2019 1041   K 4.6 10/26/2019 1041   CL 108 10/26/2019 1041   CO2 24 10/26/2019 1041   BUN 20 10/26/2019 1041  CREATININE 0.74 10/26/2019 1041      Component Value Date/Time   CALCIUM 9.3 10/26/2019 1041   ALKPHOS 56 10/26/2019 1041   AST 17 10/26/2019 1041   ALT 17 10/26/2019 1041   BILITOT 0.3 10/26/2019 1041       RADIOGRAPHIC STUDIES: I have personally reviewed the radiological images as listed and agreed with the findings in the report. XR Lumbar Spine 2-3 Views  Result Date: 10/25/2019 AP lateral lumbar spine x-rays are obtained and reviewed.  No lytic or sclerotic lesions are noted in the pelvis.  There is some asymmetrical narrowing at L4-5 worse on the left than right.  Mild facet changes disc base narrowing L2-3 also L5-S1. Impression: Mild lumbar disc degeneration without evidence of sclerotic or  bone destructive lesions.  No compression fractures.

## 2019-10-26 NOTE — Assessment & Plan Note (Addendum)
Her examination is benign and her tumor marker is pending I will call her with test results next week If CA-125 is elevated, we will repeat CT imaging Her last CT imaging showed no evidence of cancer We will continue close monitoring and follow-up She is scheduled to see me in 6 months I will get her an appointment to see GYN surgeon in 3 months We will continue port maintenance and blood work monitoring

## 2019-10-26 NOTE — Assessment & Plan Note (Signed)
She has acute elevated blood pressure likely secondary to anxiety Observe for now

## 2019-10-27 LAB — CA 125: Cancer Antigen (CA) 125: 65.7 U/mL — ABNORMAL HIGH (ref 0.0–38.1)

## 2019-10-30 ENCOUNTER — Telehealth: Payer: Self-pay

## 2019-10-30 ENCOUNTER — Other Ambulatory Visit: Payer: Self-pay | Admitting: Hematology and Oncology

## 2019-10-30 DIAGNOSIS — C569 Malignant neoplasm of unspecified ovary: Secondary | ICD-10-CM

## 2019-10-30 DIAGNOSIS — C5701 Malignant neoplasm of right fallopian tube: Secondary | ICD-10-CM

## 2019-10-30 MED ORDER — PREDNISONE 50 MG PO TABS
ORAL_TABLET | ORAL | 0 refills | Status: DC
Start: 2019-10-30 — End: 2019-12-21

## 2019-10-30 NOTE — Telephone Encounter (Signed)
Called back and given upcoming appts time/date. Instructed on times to take Prednisone and benadryl prior to CT. She verbalized  Understanding. Scheduled appt with Dr. Alvy Bimler for 7/16 at 1035. Instructed to call for questions.

## 2019-10-30 NOTE — Telephone Encounter (Signed)
Called and given below message. She verbalized understanding. 

## 2019-10-30 NOTE — Telephone Encounter (Signed)
-----   Message from [name], MD sent at 10/30/2019  8:01 AM EDT ----- Regarding: CA-125 is high Pls call her CA-125 came back high I recommend CT next Monday or Tuesday She has contrast allergy; I sent prednisone to her pharmacy, she also needs benadryl pls give her appropriate instructions Once CT is schedule, pls obtain authorization I can see her Wednesday 7/14 either at 920 for 40 mins or 220 for 40 mins, pls schedule

## 2019-11-07 ENCOUNTER — Ambulatory Visit: Payer: Medicare HMO | Admitting: Orthopaedic Surgery

## 2019-11-07 ENCOUNTER — Ambulatory Visit: Payer: Medicare HMO | Admitting: Hematology and Oncology

## 2019-11-08 ENCOUNTER — Ambulatory Visit (HOSPITAL_COMMUNITY)
Admission: RE | Admit: 2019-11-08 | Discharge: 2019-11-08 | Disposition: A | Discharge status: Home / Self Care | Payer: Medicare HMO | Source: Ambulatory Visit | Attending: Hematology and Oncology | Admitting: Hematology and Oncology

## 2019-11-08 ENCOUNTER — Other Ambulatory Visit: Payer: Self-pay

## 2019-11-08 DIAGNOSIS — C5701 Malignant neoplasm of right fallopian tube: Secondary | ICD-10-CM | POA: Insufficient documentation

## 2019-11-08 DIAGNOSIS — C569 Malignant neoplasm of unspecified ovary: Secondary | ICD-10-CM | POA: Insufficient documentation

## 2019-11-08 MED ORDER — HEPARIN SOD (PORK) LOCK FLUSH 100 UNIT/ML IV SOLN
INTRAVENOUS | Status: AC
  Filled 2019-11-08: qty 5

## 2019-11-08 MED ORDER — SODIUM CHLORIDE (PF) 0.9 % IJ SOLN
INTRAMUSCULAR | Status: AC
  Filled 2019-11-08: qty 50

## 2019-11-08 MED ORDER — IOHEXOL 300 MG/ML  SOLN
100.0000 mL | Freq: Once | INTRAMUSCULAR | Status: AC | PRN
  Administered 2019-11-08: 100 mL via INTRAVENOUS

## 2019-11-09 ENCOUNTER — Other Ambulatory Visit: Payer: Self-pay

## 2019-11-09 ENCOUNTER — Telehealth: Payer: Self-pay | Admitting: Hematology and Oncology

## 2019-11-09 ENCOUNTER — Inpatient Hospital Stay (HOSPITAL_BASED_OUTPATIENT_CLINIC_OR_DEPARTMENT_OTHER): Payer: Medicare HMO | Admitting: Hematology and Oncology

## 2019-11-09 ENCOUNTER — Encounter: Payer: Self-pay | Admitting: Hematology and Oncology

## 2019-11-09 DIAGNOSIS — C561 Malignant neoplasm of right ovary: Secondary | ICD-10-CM | POA: Diagnosis not present

## 2019-11-09 DIAGNOSIS — C5701 Malignant neoplasm of right fallopian tube: Secondary | ICD-10-CM | POA: Diagnosis not present

## 2019-11-09 NOTE — Progress Notes (Signed)
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[facility]) I reviewed the blood work and CT imaging with the patient Currently, she is not symptomatic There is subtle change in the pelvic region over the last few months, suspicious for possible early recurrence with peritoneal disease We discussed the signs and symptoms to watch out for such as nausea, abdominal pain or changes in bowel habits The patient have chronic constipation and I recommend her to take laxatives on a regular basis to avoid constipation I plan to see her again in 6 weeks for further follow-up with repeat blood work and physical examination If she remains asymptomatic, I plan to repeat imaging study in October, which is approximately 3 months from now to document changes The patient is in agreement with the plan of care She is not interested to start treatment right now due to lack of symptoms which I think is completely appropriate   No orders of the defined types were placed in this encounter.   All questions were answered. The patient knows to call the clinic with any problems, questions or concerns. The total time spent in the appointment was 20 minutes encounter with patients including review of chart and various tests results, discussions about plan of care and coordination of care plan   [name], MD 11/09/2019 10:50 AM  INTERVAL HISTORY: Please see below for problem oriented charting. She returns to review test results She has been feeling well She has chronic constipation, alleviated by laxatives No recent nausea or abdominal pain Her weight is stable  SUMMARY OF ONCOLOGIC HISTORY: Oncology History Overview Note  High grade serous, right fallopian tube  HRD, BRCA tumor testing were neg   Ovarian cancer ([place])   03/21/2018 Initial Diagnosis   Ovarian cancer ([place])   Fallopian tube cancer, carcinoma, right ([place])  10/24/2017 Initial Diagnosis   She has presentation of vague abdominal pain   02/20/2018 Imaging   Outside CT abdomen and pelvis: This revealed an incidental finding of a 1.5 cm subcapsular lesion in the lateral upper pole of the left kidney which measured higher than fluid attenuation which could represent a proteinaceous renal cyst or solid renal mass.  There was no ascites.  There is no lymphadenopathy.  There were no masses in the pelvis including ovarian or adnexal masses seen.  There was however soft tissue stranding seen with nodularity in the omental fat in the lower abdomen without evidence of ascites.  This was felt to represent either carcinomatosis or inflammatory infectious etiologies.  There was colonic diverticulosis seen without radiographic evidence of diverticulitis.  The radiologist recommended MRI of the abdomen to further work-up the renal mass   02/22/2018 Tumor Marker   Patient's tumor was tested for the following markers: CA-125. Results of the tumor marker test revealed 103.   03/06/2018 Pathology Results   Omentum, biopsy - ADENOCARCINOMA WITH PSAMMOMA BODIES, SEE COMMENT. Microscopic Comment There are scattered small foci of malignant glands with associated psammoma bodies. While the tumor is somewhat limited the cells have a more low grade appearance. There is prominent lymphovascular invasion. Immunohistochemistry reveals the cells are positive for cytokeratin 7, ER, MOC31, PAX8, and WT-1. They are negative for calretinin, cytokeratin 20, CDX-2, and TTF-1. Overall, the findings are consistent with adenocarcinoma. The differential includes a primary gynecologic or peritoneal tumor, although  the morphology is not typical of a high grade serous carcinoma.   03/06/2018 Surgery   Surgeon: Donaciano Eva   Pre-operative Diagnosis: omental mass, elevated CA  125 Operation: Laparoscopic omentectomy  Surgeon: Donaciano Eva  Operative Findings:  : normal diaphragm and upper omentum. Sigmoid colon densely adherent to left pelvic side wall consistent with history of diverticulitis. Omentum adherent to anterior abdominal wall and sigmoid colon. Surgically absent uterus. Right ovary very small and grossly normal. Left ovary obscured by adhesed sigmoid colon. No ascites. No carcinomatosis. There was a nodular firm distal omentum on the left which was adherent to the sigmoid colon and most consistent with inflammatory change.      03/21/2018 Pathology Results   1. Soft tissue mass, simple excision, midline abdominal wall mass - METASTATIC SEROUS CARCINOMA. 2. Omentum, resection for tumor - METASTATIC SEROUS CARCINOMA. 3. Adnexa - ovary +/- tube, neoplastic, right - RIGHT FALLOPIAN TUBE: -HIGH GRADE SEROUS CARCINOMA. -SEE ONCOLOGY TABLE. - RIGHT OVARY: SURFACE PSAMMOMA BODIES. NO DEFINITIVE MALIGNANT CELLS. -INCLUSION CYSTS. 4. Adnexa - ovary +/- tube, neoplastic, left - LEFT FALLOPIAN TUBE: LUMINAL AND SURFACE SEROUS CARCINOMA. - LEFT OVARY: FOCAL PSAMMOMA BODIES AND MALIGNANT CELLS. 5. Soft tissue mass, simple excision, left abdominal wall - METASTATIC SEROUS CARCINOMA. Microscopic Comment 3. OVARY or FALLOPIAN TUBE or PRIMARY PERITONEUM: Procedure: Bilateral salpingo-oophorectomy. Abdominal wall mass excision and omentectomy. Specimen Integrity: Intact. Tumor Site: Right fallopian tube. Ovarian Surface Involvement (required only if applicable): Present (left). Fallopian Tube Surface Involvement (required only if applicable): Present. Tumor Size: 1.5 cm. Histologic Type: High grade serous carcinoma. Histologic Grade: High grade. Implants (required for advanced stage serous/seromucinous borderline tumors only): Present. Other Tissue/ Organ Involvement: Omentum, abdominal wall, left fallopian tube and ovary. Largest Extrapelvic  Peritoneal Focus (required only if applicable): >2 cm. Peritoneal/Ascitic Fluid: N/A. Treatment Effect (required only for high-grade serous carcinomas): N/A. Regional Lymph Nodes: No lymph nodes submitted or found Pathologic Stage Classification (pTNM, AJCC 8th Edition): pT3c, pNX Representative Tumor Block: 3A Comment(s): None.   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   Surgeon: Thereasa Solo, MD. Specimens: Bilateral tubes / ovaries, omentum. Midline abdominal wall mass, left lateral abdominal wall mass.    Operative Findings:  Abdominal wall masses consistent with port site metastases at the umbilical incision (7cm) and left lateral incision (5cm). Omental caking in the infracolic omentum. Grossly normal very small tubes and ovaries. Normal diaphragms. No lymphadenopathy. Normal small and large intestine with the exception of miliary studding of tumor on the surface of the sigmoid colon and rectum in the cul de sac.    This represented an optimal cytoreduction (R1) with no gross visible disease remaining, however there was a thin rind of tumor on the lateral left fascia associated with the port site metastasis.     04/11/2018 Cancer Staging   Staging form: Ovary, Fallopian Tube, and Primary Peritoneal Carcinoma, AJCC 8th Edition - Pathologic: FIGO Stage IIIC (pT3, pN0, cM0) - Signed by [name], MD on 04/11/2018   05/23/2018 Imaging   1. Interval resolution of the anterior midline abdominal wall seroma. There is some persistent wispy soft tissue attenuation in the region of the midline incision, nonspecific and may simply reflect granulation tissue from surgery. 2. Stable 13 mm exophytic lesion posterior left kidney with attenuation higher than expected for a  simple cyst. This may be a cyst complicated by proteinaceous debris or hemorrhage, but attention on follow-up recommended. 3. Stable mild persistent distention of proximal jejunal loops with gradual tapering to nondilated distal small bowel. 4. No overt peritoneal or mesenteric disease identified on this exam.   05/23/2018 Tumor Marker   Patient's tumor was tested for the following markers: CA-125 Results of the tumor marker test revealed 11.6   06/02/2018 - 09/20/2018 Chemotherapy   The patient had carboplatin and taxol   06/19/2018 Procedure   Status post right IJ port catheter placement. Catheter ready for use.   06/26/2018 Tumor Marker   Patient's tumor was tested for the following markers: CA-125. Results of the tumor marker test revealed 10.7   09/20/2018 Tumor Marker   Patient's tumor was tested for the following markers: CA-125. Results of the tumor marker test revealed 9.1   10/25/2018 Imaging   1. There is suspicious, abnormal asymmetric increased soft tissue involving the cecum and ascending colon. This is suspicious for residual/progressive serosal involvement by tumor. 2. No additional sites of disease identified. No evidence for nodal metastasis, solid organ metastasis or ascites.     11/07/2018 Procedure   She had negative colonoscopy evaluation   12/05/2018 Tumor Marker   Patient's tumor was tested for the following markers: CA-125 Results of the tumor marker test revealed 8.6   01/25/2019 Imaging   Ct abdomen and pelvis Signs of omentectomy and hysterectomy without definitive signs of residual recurrent disease. Small nodular focus bridging rectum and vagina is stable dating back January to 12/14/2018, potentially postoperative but suggest attention on subsequent imaging.   01/25/2019 Tumor Marker   Patient's tumor was tested for the following markers: CA-125 Results of the tumor marker test revealed 10.3   03/15/2019 Tumor Marker   Patient's tumor was tested for the  following markers: CA-125 Results of the tumor marker test revealed 9.6   03/23/2019 Imaging   No acute findings in the abdomen or pelvis.   Colonic diverticulosis.  No active diverticulitis.   Prior open tectum E and hysterectomy.   Aortic atherosclerosis.     05/10/2019 Tumor Marker   Patient's tumor was tested for the following markers: CA-125 Results of the tumor marker test revealed 11.8   05/22/2019 Imaging   1. Stable exam. No evidence of recurrent or metastatic carcinoma within the abdomen or pelvis. 2. Colonic diverticulosis, without radiographic evidence of diverticulitis.     11/08/2019 Imaging   1. Status post hysterectomy and bilateral oophorectomy. 2. Soft tissue thickening within the deep pelvis which when compared to multiple prior exams is felt to be slowly progressive. Especially given the elevated CA 125 level, this is suspicious for peritoneal recurrence. Potential imaging strategies include PET (which may be of low sensitivity secondary to the lack of well-defined dominant mass) or pelvic CT follow-up at 3 months. 3. Otherwise, no evidence of metastatic disease within the chest, abdomen, or pelvis. 4. Coronary artery atherosclerosis. Aortic Atherosclerosis (ICD10-I70.0). 5. Ventral abdominal wall hernia containing nonobstructive transverse colon, as before. 6.  Possible constipation.     REVIEW OF SYSTEMS:   Constitutional: Denies fevers, chills or abnormal weight loss Eyes: Denies blurriness of vision Ears, nose, mouth, throat, and face: Denies mucositis or sore throat Respiratory: Denies cough, dyspnea or wheezes Cardiovascular: Denies palpitation, chest discomfort or lower extremity swelling Skin: Denies abnormal skin rashes Lymphatics: Denies new lymphadenopathy or easy bruising Neurological:Denies numbness, tingling or new weaknesses Behavioral/Psych: Mood is stable, no new changes  All  other systems were reviewed with the patient and are  negative.  I have reviewed the past medical history, past surgical history, social history and family history with the patient and they are unchanged from previous note.  ALLERGIES:  is allergic to ivp dye [iodinated diagnostic agents], propoxyphene, codeine, and ultram [tramadol hcl].  MEDICATIONS:  Current Outpatient Medications  Medication Sig Dispense Refill  . Biotin 1 MG CAPS Take 1 tablet by mouth daily.    . carboxymethylcellulose (REFRESH PLUS) 0.5 % SOLN Place 1 drop into both eyes 3 (three) times daily as needed (for dry eyes.).    Marland Kitchen Cholecalciferol (VITAMIN D3 SUPER STRENGTH) 50 MCG (2000 UT) TABS Take 2,000 Units by mouth daily.    . Elastic Bandages & Supports (ABDOMINAL BINDER/ELASTIC MED) MISC 1 Device by Does not apply route daily as needed. 1 each 1  . ibuprofen (ADVIL) 400 MG tablet Take 400 mg by mouth 2 (two) times daily.    Marland Kitchen lubiprostone (AMITIZA) 8 MCG capsule Take 1 capsule (8 mcg total) by mouth 2 (two) times daily with a meal. Pharmacy- d/c rx for linzess. Not covered well by insurance 60 capsule 1  . polyethylene glycol (MIRALAX / GLYCOLAX) 17 g packet Take 17 g by mouth daily as needed. (Patient not taking: Reported on 10/25/2019)    . predniSONE (DELTASONE) 50 MG tablet Take 1 pill at 13 hours, 7 hours and 1 hour before CT scan 3 tablet 0  . triamcinolone cream (KENALOG) 0.5 % Apply 1 application topically 2 (two) times daily. Apply to eyelids/eyebrows  4   No current facility-administered medications for this visit.    PHYSICAL EXAMINATION: ECOG PERFORMANCE STATUS: 1 - Symptomatic but completely ambulatory  Vitals:   11/09/19 1023  BP: (!) 153/82  Pulse: 71  Resp: 18  Temp: 98.3 F (36.8 C)  SpO2: 100%   Filed Weights   11/09/19 1023  Weight: 157 lb 6.4 oz (71.4 kg)    GENERAL:alert, no distress and comfortable NEURO: alert & oriented x 3 with fluent speech, no focal motor/sensory deficits  LABORATORY DATA:  I have reviewed the data as listed     Component Value Date/Time   NA 144 10/26/2019 1041   K 4.6 10/26/2019 1041   CL 108 10/26/2019 1041   CO2 24 10/26/2019 1041   GLUCOSE 88 10/26/2019 1041   BUN 20 10/26/2019 1041   CREATININE 0.74 10/26/2019 1041   CALCIUM 9.3 10/26/2019 1041   PROT 6.9 10/26/2019 1041   ALBUMIN 3.8 10/26/2019 1041   AST 17 10/26/2019 1041   ALT 17 10/26/2019 1041   ALKPHOS 56 10/26/2019 1041   BILITOT 0.3 10/26/2019 1041   GFRNONAA >60 10/26/2019 1041   GFRAA >60 10/26/2019 1041    No results found for: SPEP, UPEP  Lab Results  Component Value Date   WBC 9.0 10/26/2019   NEUTROABS 5.4 10/26/2019   HGB 12.5 10/26/2019   HCT 39.3 10/26/2019   MCV 88.9 10/26/2019   PLT 192 10/26/2019      Chemistry      Component Value Date/Time   NA 144 10/26/2019 1041   K 4.6 10/26/2019 1041   CL 108 10/26/2019 1041   CO2 24 10/26/2019 1041   BUN 20 10/26/2019 1041   CREATININE 0.74 10/26/2019 1041      Component Value Date/Time   CALCIUM 9.3 10/26/2019 1041   ALKPHOS 56 10/26/2019 1041   AST 17 10/26/2019 1041   ALT 17 10/26/2019 1041   BILITOT  0.3 10/26/2019 1041       RADIOGRAPHIC STUDIES: I have reviewed multiple imaging studies with the patient I have personally reviewed the radiological images as listed and agreed with the findings in the report. CT CHEST W CONTRAST  Result Date: 11/08/2019 CLINICAL DATA:  Ovarian cancer. Treatments complete. Surveillance. Asymptomatic. CA 125 of 65.7 earlier this month. 31.82 months ago. EXAM: CT CHEST, ABDOMEN, AND PELVIS WITH CONTRAST TECHNIQUE: Multidetector CT imaging of the chest, abdomen and pelvis was performed following the standard protocol during bolus administration of intravenous contrast. CONTRAST:  170m OMNIPAQUE IOHEXOL 300 MG/ML  SOLN COMPARISON:  08/23/2019 abdominopelvic CT. Most recent chest CT 05/23/2018 FINDINGS: CT CHEST FINDINGS Cardiovascular: Right Port-A-Cath tip high right atrium. Aortic atherosclerosis. Tortuous thoracic  aorta. Normal heart size, without pericardial effusion. Multivessel coronary artery atherosclerosis. No central pulmonary embolism, on this non-dedicated study. Mediastinum/Nodes: Multiple small left supraclavicular/jugular nodes are similar, not pathologic by size criteria, and likely reactive. No mediastinal or hilar adenopathy. Lungs/Pleura: No pleural fluid. No suspicious pulmonary nodule or mass. Musculoskeletal: No acute osseous abnormality. CT ABDOMEN PELVIS FINDINGS Hepatobiliary: Normal liver. Normal gallbladder, without biliary ductal dilatation. Pancreas: Heterogeneous fatty replacement throughout the pancreas. No duct dilatation or acute inflammation. Spleen: Normal in size, without focal abnormality. Adrenals/Urinary Tract: Normal adrenal glands.o 1.6 cm left renal lesion is similar in size to on the prior, measuring slightly greater than fluid density. Bilateral too small to characterize renal lesions. No hydronephrosis. Normal urinary bladder. Stomach/Bowel: Normal stomach, without wall thickening. Extensive colonic diverticulosis. Colonic stool burden suggests constipation. Ventral abdominal wall laxity and hernia containing fat and minimal nonobstructive transverse colon, similar to on the prior. The cecum extends into the upper central pelvis. Normal terminal ileum. Normal small bowel. Vascular/Lymphatic: Aortic atherosclerosis. No abdominopelvic adenopathy. Reproductive: Hysterectomy.  No adnexal mass. Other: No significant free fluid. No abdominal peritoneal nodularity or omental metastasis. Again identified within the deep pelvis is interstitial thickening, including on images 100, 103. Primarily felt to be similar to the most recent exam. Possibly progressive along the 1 o'clock position of the rectum, including on 105/2. New compared to remote exam of 05/23/2018. Felt to be progressive compared to 05/22/2019. Musculoskeletal: L4-5 and L5-S1 disc bulges and degenerative disc disease.  IMPRESSION: 1. Status post hysterectomy and bilateral oophorectomy. 2. Soft tissue thickening within the deep pelvis which when compared to multiple prior exams is felt to be slowly progressive. Especially given the elevated CA 125 level, this is suspicious for peritoneal recurrence. Potential imaging strategies include PET (which may be of low sensitivity secondary to the lack of well-defined dominant mass) or pelvic CT follow-up at 3 months. 3. Otherwise, no evidence of metastatic disease within the chest, abdomen, or pelvis. 4. Coronary artery atherosclerosis. Aortic Atherosclerosis (ICD10-I70.0). 5. Ventral abdominal wall hernia containing nonobstructive transverse colon, as before. 6.  Possible constipation. Electronically Signed   By: KAbigail MiyamotoM.D.   On: 11/08/2019 20:45   CT ABDOMEN PELVIS W CONTRAST  Result Date: 11/08/2019 CLINICAL DATA:  Ovarian cancer. Treatments complete. Surveillance. Asymptomatic. CA 125 of 65.7 earlier this month. 31.82 months ago. EXAM: CT CHEST, ABDOMEN, AND PELVIS WITH CONTRAST TECHNIQUE: Multidetector CT imaging of the chest, abdomen and pelvis was performed following the standard protocol during bolus administration of intravenous contrast. CONTRAST:  1061mOMNIPAQUE IOHEXOL 300 MG/ML  SOLN COMPARISON:  08/23/2019 abdominopelvic CT. Most recent chest CT 05/23/2018 FINDINGS: CT CHEST FINDINGS Cardiovascular: Right Port-A-Cath tip high right atrium. Aortic atherosclerosis. Tortuous thoracic aorta. Normal heart size,  without pericardial effusion. Multivessel coronary artery atherosclerosis. No central pulmonary embolism, on this non-dedicated study. Mediastinum/Nodes: Multiple small left supraclavicular/jugular nodes are similar, not pathologic by size criteria, and likely reactive. No mediastinal or hilar adenopathy. Lungs/Pleura: No pleural fluid. No suspicious pulmonary nodule or mass. Musculoskeletal: No acute osseous abnormality. CT ABDOMEN PELVIS FINDINGS  Hepatobiliary: Normal liver. Normal gallbladder, without biliary ductal dilatation. Pancreas: Heterogeneous fatty replacement throughout the pancreas. No duct dilatation or acute inflammation. Spleen: Normal in size, without focal abnormality. Adrenals/Urinary Tract: Normal adrenal glands.o 1.6 cm left renal lesion is similar in size to on the prior, measuring slightly greater than fluid density. Bilateral too small to characterize renal lesions. No hydronephrosis. Normal urinary bladder. Stomach/Bowel: Normal stomach, without wall thickening. Extensive colonic diverticulosis. Colonic stool burden suggests constipation. Ventral abdominal wall laxity and hernia containing fat and minimal nonobstructive transverse colon, similar to on the prior. The cecum extends into the upper central pelvis. Normal terminal ileum. Normal small bowel. Vascular/Lymphatic: Aortic atherosclerosis. No abdominopelvic adenopathy. Reproductive: Hysterectomy.  No adnexal mass. Other: No significant free fluid. No abdominal peritoneal nodularity or omental metastasis. Again identified within the deep pelvis is interstitial thickening, including on images 100, 103. Primarily felt to be similar to the most recent exam. Possibly progressive along the 1 o'clock position of the rectum, including on 105/2. New compared to remote exam of 05/23/2018. Felt to be progressive compared to 05/22/2019. Musculoskeletal: L4-5 and L5-S1 disc bulges and degenerative disc disease. IMPRESSION: 1. Status post hysterectomy and bilateral oophorectomy. 2. Soft tissue thickening within the deep pelvis which when compared to multiple prior exams is felt to be slowly progressive. Especially given the elevated CA 125 level, this is suspicious for peritoneal recurrence. Potential imaging strategies include PET (which may be of low sensitivity secondary to the lack of well-defined dominant mass) or pelvic CT follow-up at 3 months. 3. Otherwise, no evidence of metastatic  disease within the chest, abdomen, or pelvis. 4. Coronary artery atherosclerosis. Aortic Atherosclerosis (ICD10-I70.0). 5. Ventral abdominal wall hernia containing nonobstructive transverse colon, as before. 6.  Possible constipation. Electronically Signed   By: Abigail Miyamoto M.D.   On: 11/08/2019 20:45   XR Lumbar Spine 2-3 Views  Result Date: 10/25/2019 AP lateral lumbar spine x-rays are obtained and reviewed.  No lytic or sclerotic lesions are noted in the pelvis.  There is some asymmetrical narrowing at L4-5 worse on the left than right.  Mild facet changes disc base narrowing L2-3 also L5-S1. Impression: Mild lumbar disc degeneration without evidence of sclerotic or bone destructive lesions.  No compression fractures.

## 2019-11-09 NOTE — Telephone Encounter (Signed)
Scheduled appts per 7/16 sch msg. Gave pt a print out of AVS.

## 2019-11-09 NOTE — Assessment & Plan Note (Signed)
I reviewed the blood work and CT imaging with the patient Currently, she is not symptomatic There is subtle change in the pelvic region over the last few months, suspicious for possible early recurrence with peritoneal disease We discussed the signs and symptoms to watch out for such as nausea, abdominal pain or changes in bowel habits The patient have chronic constipation and I recommend her to take laxatives on a regular basis to avoid constipation I plan to see her again in 6 weeks for further follow-up with repeat blood work and physical examination If she remains asymptomatic, I plan to repeat imaging study in October, which is approximately 3 months from now to document changes The patient is in agreement with the plan of care She is not interested to start treatment right now due to lack of symptoms which I think is completely appropriate

## 2019-11-14 ENCOUNTER — Telehealth: Payer: Self-pay | Admitting: Oncology

## 2019-11-14 NOTE — Telephone Encounter (Signed)
Misty Hopkins called and said her ribs have been sore and is wondering if it is caused by the radiation from her CT scan last week.  She has also been exercising more this week.  Advised her that it would not be from the radiation but could be how she was positioned on the table.  Also advised her that is more likely to be from exercising.  She verbalized understanding and will call back if the aching get worse.

## 2019-12-21 ENCOUNTER — Inpatient Hospital Stay: Payer: Medicare HMO | Attending: Hematology and Oncology | Admitting: Hematology and Oncology

## 2019-12-21 ENCOUNTER — Inpatient Hospital Stay: Payer: Medicare HMO

## 2019-12-21 ENCOUNTER — Other Ambulatory Visit: Payer: Self-pay

## 2019-12-21 VITALS — BP 177/93 | HR 69 | Temp 98.2°F | Resp 17 | Ht 63.0 in | Wt 154.7 lb

## 2019-12-21 DIAGNOSIS — K5909 Other constipation: Secondary | ICD-10-CM | POA: Diagnosis not present

## 2019-12-21 DIAGNOSIS — I2510 Atherosclerotic heart disease of native coronary artery without angina pectoris: Secondary | ICD-10-CM | POA: Insufficient documentation

## 2019-12-21 DIAGNOSIS — Z885 Allergy status to narcotic agent status: Secondary | ICD-10-CM | POA: Insufficient documentation

## 2019-12-21 DIAGNOSIS — C5701 Malignant neoplasm of right fallopian tube: Secondary | ICD-10-CM | POA: Diagnosis not present

## 2019-12-21 DIAGNOSIS — C561 Malignant neoplasm of right ovary: Secondary | ICD-10-CM | POA: Insufficient documentation

## 2019-12-21 DIAGNOSIS — C569 Malignant neoplasm of unspecified ovary: Secondary | ICD-10-CM

## 2019-12-21 DIAGNOSIS — Z7952 Long term (current) use of systemic steroids: Secondary | ICD-10-CM | POA: Insufficient documentation

## 2019-12-21 DIAGNOSIS — Z79899 Other long term (current) drug therapy: Secondary | ICD-10-CM | POA: Insufficient documentation

## 2019-12-21 DIAGNOSIS — K5730 Diverticulosis of large intestine without perforation or abscess without bleeding: Secondary | ICD-10-CM | POA: Insufficient documentation

## 2019-12-21 DIAGNOSIS — I700 Atherosclerosis of aorta: Secondary | ICD-10-CM | POA: Insufficient documentation

## 2019-12-21 LAB — CMP (CANCER CENTER ONLY)
ALT: 11 U/L (ref 0–44)
AST: 14 U/L — ABNORMAL LOW (ref 15–41)
Albumin: 3.8 g/dL (ref 3.5–5.0)
Alkaline Phosphatase: 66 U/L (ref 38–126)
Anion gap: 8 (ref 5–15)
BUN: 12 mg/dL (ref 8–23)
CO2: 26 mmol/L (ref 22–32)
Calcium: 9.6 mg/dL (ref 8.9–10.3)
Chloride: 107 mmol/L (ref 98–111)
Creatinine: 0.66 mg/dL (ref 0.44–1.00)
GFR, Est AFR Am: >60 mL/min (ref >60)
GFR, Estimated: >60 mL/min (ref >60)
Glucose, Bld: 98 mg/dL (ref 70–99)
Potassium: 3.7 mmol/L (ref 3.5–5.1)
Sodium: 141 mmol/L (ref 135–145)
Total Bilirubin: 0.6 mg/dL (ref 0.3–1.2)
Total Protein: 6.8 g/dL (ref 6.5–8.1)

## 2019-12-21 LAB — CBC WITH DIFFERENTIAL (CANCER CENTER ONLY)
Abs Immature Granulocytes: 0.03 10*3/uL (ref 0.00–0.07)
Basophils Absolute: 0 10*3/uL (ref 0.0–0.1)
Basophils Relative: 0 %
Eosinophils Absolute: 0.2 10*3/uL (ref 0.0–0.5)
Eosinophils Relative: 2 %
HCT: 41.4 % (ref 36.0–46.0)
Hemoglobin: 13.3 g/dL (ref 12.0–15.0)
Immature Granulocytes: 0 %
Lymphocytes Relative: 29 %
Lymphs Abs: 2.9 10*3/uL (ref 0.7–4.0)
MCH: 28.2 pg (ref 26.0–34.0)
MCHC: 32.1 g/dL (ref 30.0–36.0)
MCV: 87.9 fL (ref 80.0–100.0)
Monocytes Absolute: 0.9 10*3/uL (ref 0.1–1.0)
Monocytes Relative: 9 %
Neutro Abs: 5.9 10*3/uL (ref 1.7–7.7)
Neutrophils Relative %: 60 %
Platelet Count: 180 10*3/uL (ref 150–400)
RBC: 4.71 MIL/uL (ref 3.87–5.11)
RDW: 13.4 % (ref 11.5–15.5)
WBC Count: 10 10*3/uL (ref 4.0–10.5)
nRBC: 0 % (ref 0.0–0.2)

## 2019-12-21 MED ORDER — SODIUM CHLORIDE 0.9% FLUSH
10.0000 mL | Freq: Once | INTRAVENOUS | Status: AC
  Administered 2019-12-21: 10 mL
  Filled 2019-12-21: qty 10

## 2019-12-21 MED ORDER — PREDNISONE 50 MG PO TABS: ORAL_TABLET | ORAL | 0 refills | Status: DC

## 2019-12-21 MED ORDER — HEPARIN SOD (PORK) LOCK FLUSH 100 UNIT/ML IV SOLN
500.0000 [IU] | Freq: Once | INTRAVENOUS | Status: AC
  Administered 2019-12-21: 500 [IU]
  Filled 2019-12-21: qty 5

## 2019-12-21 NOTE — Patient Instructions (Signed)

## 2019-12-22 ENCOUNTER — Encounter: Payer: Self-pay | Admitting: Hematology and Oncology

## 2019-12-22 LAB — CA 125: Cancer Antigen (CA) 125: 95.2 U/mL — ABNORMAL HIGH (ref 0.0–38.1)

## 2019-12-22 NOTE — Progress Notes (Signed)
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[facility]) I reviewed results from recent imaging and CA-125 with her husband and explained what the abnormal results mean Currently, she is not symptomatic There is subtle change in the pelvic region over the last few months, suspicious for possible early recurrence with peritoneal disease We discussed the signs and symptoms to watch out for such as nausea, abdominal pain or changes in bowel habits The patient have chronic constipation and I recommend her to take laxatives on a regular basis to avoid constipation I plan to see her again in 6 weeks for further follow-up with repeat blood work, imaging studies and physical examination The patient is in agreement with the plan of care She is not interested to start treatment right now due to lack of symptoms which I think is completely appropriate  Other constipation The patient continues to describe symptoms of constipation She will continue taking laxatives    No orders of the defined types were placed in this encounter.   All questions were answered. The patient knows to call the clinic with any problems, questions or concerns. The total time spent in the appointment was 20 minutes encounter with patients including review of chart and various tests results, discussions about plan of care and coordination of care plan   [name], MD 12/22/2019 1:51 PM  INTERVAL HISTORY: Please see below for problem oriented charting. She returns with her husband for follow-up She has occasional twinges of abdominal discomfort and takes regular fiber and laxatives for normal bowel movement No nausea or vomiting Denies bloating or vaginal bleeding  SUMMARY OF ONCOLOGIC HISTORY: Oncology History Overview Note   High grade serous, right fallopian tube  HRD, BRCA tumor testing were neg   Ovarian cancer ([place])  03/21/2018 Initial Diagnosis   Ovarian cancer ([place])   Fallopian tube cancer, carcinoma, right ([place])  10/24/2017 Initial Diagnosis   She has presentation of vague abdominal pain   02/20/2018 Imaging   Outside CT abdomen and pelvis: This revealed an incidental finding of a 1.5 cm subcapsular lesion in the lateral upper pole of the left kidney which measured higher than fluid attenuation which could represent a proteinaceous renal cyst or solid renal mass.  There was no ascites.  There is no lymphadenopathy.  There were no masses in the pelvis including ovarian or adnexal masses seen.  There was however soft tissue stranding seen with nodularity in the omental fat in the lower abdomen without evidence of ascites.  This was felt to represent either carcinomatosis or inflammatory infectious etiologies.  There was colonic diverticulosis seen without radiographic evidence of diverticulitis.  The radiologist recommended MRI of the abdomen to further work-up the renal mass   02/22/2018 Tumor Marker   Patient's tumor was tested for the following markers: CA-125. Results of the tumor marker test revealed 103.   03/06/2018 Pathology Results   Omentum, biopsy - ADENOCARCINOMA WITH PSAMMOMA BODIES, SEE COMMENT. Microscopic Comment There are scattered small foci of malignant glands with associated psammoma bodies. While the tumor is somewhat limited the cells have a more low grade appearance. There is prominent lymphovascular invasion. Immunohistochemistry reveals the cells are positive for cytokeratin 7, ER, MOC31, PAX8, and WT-1. They are negative for calretinin, cytokeratin 20, CDX-2, and TTF-1. Overall, the findings are consistent with adenocarcinoma. The  differential includes a primary gynecologic or peritoneal tumor, although the morphology is not typical of a high grade serous carcinoma.   03/06/2018  Surgery   Surgeon: Donaciano Eva   Pre-operative Diagnosis: omental mass, elevated CA 125 Operation: Laparoscopic omentectomy  Surgeon: Donaciano Eva  Operative Findings:  : normal diaphragm and upper omentum. Sigmoid colon densely adherent to left pelvic side wall consistent with history of diverticulitis. Omentum adherent to anterior abdominal wall and sigmoid colon. Surgically absent uterus. Right ovary very small and grossly normal. Left ovary obscured by adhesed sigmoid colon. No ascites. No carcinomatosis. There was a nodular firm distal omentum on the left which was adherent to the sigmoid colon and most consistent with inflammatory change.      03/21/2018 Pathology Results   1. Soft tissue mass, simple excision, midline abdominal wall mass - METASTATIC SEROUS CARCINOMA. 2. Omentum, resection for tumor - METASTATIC SEROUS CARCINOMA. 3. Adnexa - ovary +/- tube, neoplastic, right - RIGHT FALLOPIAN TUBE: -HIGH GRADE SEROUS CARCINOMA. -SEE ONCOLOGY TABLE. - RIGHT OVARY: SURFACE PSAMMOMA BODIES. NO DEFINITIVE MALIGNANT CELLS. -INCLUSION CYSTS. 4. Adnexa - ovary +/- tube, neoplastic, left - LEFT FALLOPIAN TUBE: LUMINAL AND SURFACE SEROUS CARCINOMA. - LEFT OVARY: FOCAL PSAMMOMA BODIES AND MALIGNANT CELLS. 5. Soft tissue mass, simple excision, left abdominal wall - METASTATIC SEROUS CARCINOMA. Microscopic Comment 3. OVARY or FALLOPIAN TUBE or PRIMARY PERITONEUM: Procedure: Bilateral salpingo-oophorectomy. Abdominal wall mass excision and omentectomy. Specimen Integrity: Intact. Tumor Site: Right fallopian tube. Ovarian Surface Involvement (required only if applicable): Present (left). Fallopian Tube Surface Involvement (required only if applicable): Present. Tumor Size: 1.5 cm. Histologic Type: High grade serous carcinoma. Histologic Grade: High grade. Implants (required for advanced stage serous/seromucinous borderline tumors only): Present. Other Tissue/  Organ Involvement: Omentum, abdominal wall, left fallopian tube and ovary. Largest Extrapelvic Peritoneal Focus (required only if applicable): >2 cm. Peritoneal/Ascitic Fluid: N/A. Treatment Effect (required only for high-grade serous carcinomas): N/A. Regional Lymph Nodes: No lymph nodes submitted or found Pathologic Stage Classification (pTNM, AJCC 8th Edition): pT3c, pNX Representative Tumor Block: 3A Comment(s): None.   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   Surgeon: Thereasa Solo, MD. Specimens: Bilateral tubes / ovaries, omentum. Midline abdominal wall mass, left lateral abdominal wall mass.    Operative Findings:  Abdominal wall masses consistent with port site metastases at the umbilical incision (7cm) and left lateral incision (5cm). Omental caking in the infracolic omentum. Grossly normal very small tubes and ovaries. Normal diaphragms. No lymphadenopathy. Normal small and large intestine with the exception of miliary studding of tumor on the surface of the sigmoid colon and rectum in the cul de sac.    This represented an optimal cytoreduction (R1) with no gross visible disease remaining, however there was a thin rind of tumor on the lateral left fascia associated with the port site metastasis.     04/11/2018 Cancer Staging   Staging form: Ovary, Fallopian Tube, and Primary Peritoneal Carcinoma, AJCC 8th Edition - Pathologic: FIGO Stage IIIC (pT3, pN0, cM0) - Signed by [name], MD on 04/11/2018   05/23/2018 Imaging   1. Interval resolution of the anterior midline abdominal wall seroma. There is some persistent wispy soft tissue attenuation in the region of the midline incision, nonspecific and may simply reflect granulation tissue from surgery. 2.  Stable 13 mm exophytic lesion posterior  left kidney with attenuation higher than expected for a simple cyst. This may be a cyst complicated by proteinaceous debris or hemorrhage, but attention on follow-up recommended. 3. Stable mild persistent distention of proximal jejunal loops with gradual tapering to nondilated distal small bowel. 4. No overt peritoneal or mesenteric disease identified on this exam.   05/23/2018 Tumor Marker   Patient's tumor was tested for the following markers: CA-125 Results of the tumor marker test revealed 11.6   06/02/2018 - 09/20/2018 Chemotherapy   The patient had carboplatin and taxol   06/19/2018 Procedure   Status post right IJ port catheter placement. Catheter ready for use.   06/26/2018 Tumor Marker   Patient's tumor was tested for the following markers: CA-125. Results of the tumor marker test revealed 10.7   09/20/2018 Tumor Marker   Patient's tumor was tested for the following markers: CA-125. Results of the tumor marker test revealed 9.1   10/25/2018 Imaging   1. There is suspicious, abnormal asymmetric increased soft tissue involving the cecum and ascending colon. This is suspicious for residual/progressive serosal involvement by tumor. 2. No additional sites of disease identified. No evidence for nodal metastasis, solid organ metastasis or ascites.     11/07/2018 Procedure   She had negative colonoscopy evaluation   12/05/2018 Tumor Marker   Patient's tumor was tested for the following markers: CA-125 Results of the tumor marker test revealed 8.6   01/25/2019 Imaging   Ct abdomen and pelvis Signs of omentectomy and hysterectomy without definitive signs of residual recurrent disease. Small nodular focus bridging rectum and vagina is stable dating back January to 12/14/2018, potentially postoperative but suggest attention on subsequent imaging.   01/25/2019 Tumor Marker   Patient's tumor was tested for the following markers: CA-125 Results of the  tumor marker test revealed 10.3   03/15/2019 Tumor Marker   Patient's tumor was tested for the following markers: CA-125 Results of the tumor marker test revealed 9.6   03/23/2019 Imaging   No acute findings in the abdomen or pelvis.   Colonic diverticulosis.  No active diverticulitis.   Prior open tectum E and hysterectomy.   Aortic atherosclerosis.     05/10/2019 Tumor Marker   Patient's tumor was tested for the following markers: CA-125 Results of the tumor marker test revealed 11.8   05/22/2019 Imaging   1. Stable exam. No evidence of recurrent or metastatic carcinoma within the abdomen or pelvis. 2. Colonic diverticulosis, without radiographic evidence of diverticulitis.     11/08/2019 Imaging   1. Status post hysterectomy and bilateral oophorectomy. 2. Soft tissue thickening within the deep pelvis which when compared to multiple prior exams is felt to be slowly progressive. Especially given the elevated CA 125 level, this is suspicious for peritoneal recurrence. Potential imaging strategies include PET (which may be of low sensitivity secondary to the lack of well-defined dominant mass) or pelvic CT follow-up at 3 months. 3. Otherwise, no evidence of metastatic disease within the chest, abdomen, or pelvis. 4. Coronary artery atherosclerosis. Aortic Atherosclerosis (ICD10-I70.0). 5. Ventral abdominal wall hernia containing nonobstructive transverse colon, as before. 6.  Possible constipation.     REVIEW OF SYSTEMS:   Constitutional: Denies fevers, chills or abnormal weight loss Eyes: Denies blurriness of vision Ears, nose, mouth, throat, and face: Denies mucositis or sore throat Respiratory: Denies cough, dyspnea or wheezes Cardiovascular: Denies palpitation, chest discomfort or lower extremity swelling Skin: Denies abnormal skin rashes Lymphatics: Denies new lymphadenopathy or easy bruising Neurological:Denies numbness, tingling or new weaknesses  Behavioral/Psych: Mood  is stable, no new changes  All other systems were reviewed with the patient and are negative.  I have reviewed the past medical history, past surgical history, social history and family history with the patient and they are unchanged from previous note.  ALLERGIES:  is allergic to ivp dye [iodinated diagnostic agents], propoxyphene, codeine, and ultram [tramadol hcl].  MEDICATIONS:  Current Outpatient Medications  Medication Sig Dispense Refill  . Biotin 1 MG CAPS Take 1 tablet by mouth daily.    . carboxymethylcellulose (REFRESH PLUS) 0.5 % SOLN Place 1 drop into both eyes 3 (three) times daily as needed (for dry eyes.).    Marland Kitchen Cholecalciferol (VITAMIN D3 SUPER STRENGTH) 50 MCG (2000 UT) TABS Take 2,000 Units by mouth daily.    . Elastic Bandages & Supports (ABDOMINAL BINDER/ELASTIC MED) MISC 1 Device by Does not apply route daily as needed. 1 each 1  . ibuprofen (ADVIL) 400 MG tablet Take 400 mg by mouth 2 (two) times daily.    Marland Kitchen lubiprostone (AMITIZA) 8 MCG capsule Take 1 capsule (8 mcg total) by mouth 2 (two) times daily with a meal. Pharmacy- d/c rx for linzess. Not covered well by insurance 60 capsule 1  . polyethylene glycol (MIRALAX / GLYCOLAX) 17 g packet Take 17 g by mouth daily as needed. (Patient not taking: Reported on 10/25/2019)    . predniSONE (DELTASONE) 50 MG tablet Take 1 pill at 13 hours, 7 hours and 1 hour before CT scan 3 tablet 0  . triamcinolone cream (KENALOG) 0.5 % Apply 1 application topically 2 (two) times daily. Apply to eyelids/eyebrows  4   No current facility-administered medications for this visit.    PHYSICAL EXAMINATION: ECOG PERFORMANCE STATUS: 1 - Symptomatic but completely ambulatory  Vitals:   12/21/19 0918  BP: (!) 177/93  Pulse: 69  Resp: 17  Temp: 98.2 F (36.8 C)  SpO2: 100%   Filed Weights   12/21/19 0918  Weight: 154 lb 11.2 oz (70.2 kg)    GENERAL:alert, no distress and comfortable SKIN: skin color, texture, turgor are normal, no  rashes or significant lesions EYES: normal, Conjunctiva are pink and non-injected, sclera clear OROPHARYNX:no exudate, no erythema and lips, buccal mucosa, and tongue normal  NECK: supple, thyroid normal size, non-tender, without nodularity LYMPH:  no palpable lymphadenopathy in the cervical, axillary or inguinal LUNGS: clear to auscultation and percussion with normal breathing effort HEART: regular rate & rhythm and no murmurs and no lower extremity edema ABDOMEN:abdomen soft, non-tender and normal bowel sounds Musculoskeletal:no cyanosis of digits and no clubbing  NEURO: alert & oriented x 3 with fluent speech, no focal motor/sensory deficits  LABORATORY DATA:  I have reviewed the data as listed    Component Value Date/Time   NA 141 12/21/2019 0856   K 3.7 12/21/2019 0856   CL 107 12/21/2019 0856   CO2 26 12/21/2019 0856   GLUCOSE 98 12/21/2019 0856   BUN 12 12/21/2019 0856   CREATININE 0.66 12/21/2019 0856   CALCIUM 9.6 12/21/2019 0856   PROT 6.8 12/21/2019 0856   ALBUMIN 3.8 12/21/2019 0856   AST 14 (L) 12/21/2019 0856   ALT 11 12/21/2019 0856   ALKPHOS 66 12/21/2019 0856   BILITOT 0.6 12/21/2019 0856   GFRNONAA >60 12/21/2019 0856   GFRAA >60 12/21/2019 0856    No results found for: SPEP, UPEP  Lab Results  Component Value Date   WBC 10.0 12/21/2019   NEUTROABS 5.9 12/21/2019   HGB 13.3 12/21/2019  HCT 41.4 12/21/2019   MCV 87.9 12/21/2019   PLT 180 12/21/2019      Chemistry      Component Value Date/Time   NA 141 12/21/2019 0856   K 3.7 12/21/2019 0856   CL 107 12/21/2019 0856   CO2 26 12/21/2019 0856   BUN 12 12/21/2019 0856   CREATININE 0.66 12/21/2019 0856      Component Value Date/Time   CALCIUM 9.6 12/21/2019 0856   ALKPHOS 66 12/21/2019 0856   AST 14 (L) 12/21/2019 0856   ALT 11 12/21/2019 0856   BILITOT 0.6 12/21/2019 0856

## 2019-12-22 NOTE — Assessment & Plan Note (Signed)
The patient continues to describe symptoms of constipation She will continue taking laxatives

## 2019-12-22 NOTE — Assessment & Plan Note (Signed)
I reviewed results from recent imaging and CA-125 with her husband and explained what the abnormal results mean Currently, she is not symptomatic There is subtle change in the pelvic region over the last few months, suspicious for possible early recurrence with peritoneal disease We discussed the signs and symptoms to watch out for such as nausea, abdominal pain or changes in bowel habits The patient have chronic constipation and I recommend her to take laxatives on a regular basis to avoid constipation I plan to see her again in 6 weeks for further follow-up with repeat blood work, imaging studies and physical examination The patient is in agreement with the plan of care She is not interested to start treatment right now due to lack of symptoms which I think is completely appropriate

## 2019-12-24 ENCOUNTER — Telehealth: Payer: Self-pay | Admitting: Oncology

## 2019-12-24 NOTE — Telephone Encounter (Signed)
Left a message regarding CA 125 results.  Requested a return call.

## 2019-12-24 NOTE — Telephone Encounter (Signed)
Misty Hopkins called back and was advised of her CA 125 results.  Discussed that Dr. Alvy Bimler would like to wait until October for follow up since she has no symptoms and to call if she starts to have more abdominal symptoms. She verbalized understanding and wanted to let Dr. Alvy Bimler know that later in the day, she has noticed a mild "needling sensation" in her left lower abdomen. She is not sure if this is from her past surgery.  She will call if she has any more abdominal symptoms.

## 2019-12-25 ENCOUNTER — Telehealth: Payer: Self-pay | Admitting: Oncology

## 2019-12-25 ENCOUNTER — Other Ambulatory Visit: Payer: Self-pay | Admitting: Hematology and Oncology

## 2019-12-25 DIAGNOSIS — C5701 Malignant neoplasm of right fallopian tube: Secondary | ICD-10-CM

## 2019-12-25 NOTE — Telephone Encounter (Signed)
Misty Hopkins said she was not able to schedule her scan.  Advised her that I will call to schedule it and call her back.  CT scheduled for 02/11/20 at 10:30.  Called [name] back and discussed times to take prednisone and benadryl and to drink the contrast.  She verbalized understanding and agreement and knows to call back with any questions.

## 2019-12-25 NOTE — Telephone Encounter (Signed)
I gave her the paper and contrast and she is supposed to call and schedule the day before I see her

## 2019-12-25 NOTE — Telephone Encounter (Signed)
Misty Hopkins called and asked if she is supposed to have a CT scan before her next visit on 02/12/20.

## 2019-12-25 NOTE — Telephone Encounter (Signed)
Left a message for Misty Hopkins advising her to call central scheduling to have CT scheduled for 02/11/20 and to call back if she has any questions.

## 2019-12-26 ENCOUNTER — Encounter (INDEPENDENT_AMBULATORY_CARE_PROVIDER_SITE_OTHER): Payer: Self-pay | Admitting: Ophthalmology

## 2020-01-10 ENCOUNTER — Telehealth: Payer: Self-pay | Admitting: Oncology

## 2020-01-10 NOTE — Telephone Encounter (Signed)
Misty Hopkins called and wanted to let us know that she is having a gnawing, uncomfortable sensation in her lower abdomen.  It is in the area from surgery that had trouble healing.  She said the feeling comes and goes and can get strong.  Yesterday afternoon she tried tylenol which didn't help.  She did end up taking a pain pill left over from chemo which did help.  She feels better this morning. She is eating and drinking like normal and has the same issues with constipation.  She is taking Miralax and Senna as needed and her last bowel movement was yesterday.

## 2020-01-10 NOTE — Telephone Encounter (Signed)
Based on our last discussion I recommend we get her CT scan done sooner next week I can see her Monday 9/20 from 1020 to 11 am She should have orders and premed for Ct contrast allergies at home

## 2020-01-10 NOTE — Telephone Encounter (Signed)
Called Philip back and advised her that her CT scan has been moved up to 01/17/20 at 2:30 pm to evaluate her abdominal pain. Went over times to take prednisone, benadryl, contrast and NPO 4 hours before the scan.  Also scheduled appointment to go over the scan with Dr. Alvy Bimler on 01/21/20 at 10:15.  She verbalized understanding of all instructions.

## 2020-01-17 ENCOUNTER — Inpatient Hospital Stay: Payer: Medicare HMO

## 2020-01-17 ENCOUNTER — Other Ambulatory Visit: Payer: Self-pay

## 2020-01-17 ENCOUNTER — Encounter (HOSPITAL_COMMUNITY): Payer: Self-pay

## 2020-01-17 ENCOUNTER — Inpatient Hospital Stay: Payer: Medicare HMO | Attending: Hematology and Oncology

## 2020-01-17 ENCOUNTER — Ambulatory Visit (HOSPITAL_COMMUNITY)
Admission: RE | Admit: 2020-01-17 | Discharge: 2020-01-17 | Disposition: A | Discharge status: Home / Self Care | Payer: Medicare HMO | Source: Ambulatory Visit | Attending: Hematology and Oncology | Admitting: Hematology and Oncology

## 2020-01-17 DIAGNOSIS — I2510 Atherosclerotic heart disease of native coronary artery without angina pectoris: Secondary | ICD-10-CM | POA: Diagnosis not present

## 2020-01-17 DIAGNOSIS — Z79899 Other long term (current) drug therapy: Secondary | ICD-10-CM | POA: Insufficient documentation

## 2020-01-17 DIAGNOSIS — R971 Elevated cancer antigen 125 [CA 125]: Secondary | ICD-10-CM | POA: Diagnosis not present

## 2020-01-17 DIAGNOSIS — K5909 Other constipation: Secondary | ICD-10-CM | POA: Insufficient documentation

## 2020-01-17 DIAGNOSIS — K5730 Diverticulosis of large intestine without perforation or abscess without bleeding: Secondary | ICD-10-CM | POA: Diagnosis not present

## 2020-01-17 DIAGNOSIS — C561 Malignant neoplasm of right ovary: Secondary | ICD-10-CM | POA: Insufficient documentation

## 2020-01-17 DIAGNOSIS — Z885 Allergy status to narcotic agent status: Secondary | ICD-10-CM | POA: Insufficient documentation

## 2020-01-17 DIAGNOSIS — Z7952 Long term (current) use of systemic steroids: Secondary | ICD-10-CM | POA: Diagnosis not present

## 2020-01-17 DIAGNOSIS — C5701 Malignant neoplasm of right fallopian tube: Secondary | ICD-10-CM | POA: Insufficient documentation

## 2020-01-17 DIAGNOSIS — C569 Malignant neoplasm of unspecified ovary: Secondary | ICD-10-CM

## 2020-01-17 DIAGNOSIS — I700 Atherosclerosis of aorta: Secondary | ICD-10-CM | POA: Diagnosis not present

## 2020-01-17 LAB — CMP (CANCER CENTER ONLY)
ALT: 17 U/L (ref 0–44)
AST: 17 U/L (ref 15–41)
Albumin: 4 g/dL (ref 3.5–5.0)
Alkaline Phosphatase: 71 U/L (ref 38–126)
Anion gap: 7 (ref 5–15)
BUN: 16 mg/dL (ref 8–23)
CO2: 27 mmol/L (ref 22–32)
Calcium: 10 mg/dL (ref 8.9–10.3)
Chloride: 104 mmol/L (ref 98–111)
Creatinine: 0.73 mg/dL (ref 0.44–1.00)
GFR, Est AFR Am: >60 mL/min (ref >60)
GFR, Estimated: >60 mL/min (ref >60)
Glucose, Bld: 141 mg/dL — ABNORMAL HIGH (ref 70–99)
Potassium: 3.8 mmol/L (ref 3.5–5.1)
Sodium: 138 mmol/L (ref 135–145)
Total Bilirubin: 0.3 mg/dL (ref 0.3–1.2)
Total Protein: 7.7 g/dL (ref 6.5–8.1)

## 2020-01-17 LAB — CBC WITH DIFFERENTIAL (CANCER CENTER ONLY)
Abs Immature Granulocytes: 0.06 10*3/uL (ref 0.00–0.07)
Basophils Absolute: 0 10*3/uL (ref 0.0–0.1)
Basophils Relative: 0 %
Eosinophils Absolute: 0 10*3/uL (ref 0.0–0.5)
Eosinophils Relative: 0 %
HCT: 41.9 % (ref 36.0–46.0)
Hemoglobin: 13.8 g/dL (ref 12.0–15.0)
Immature Granulocytes: 1 %
Lymphocytes Relative: 14 %
Lymphs Abs: 1.5 10*3/uL (ref 0.7–4.0)
MCH: 28.5 pg (ref 26.0–34.0)
MCHC: 32.9 g/dL (ref 30.0–36.0)
MCV: 86.4 fL (ref 80.0–100.0)
Monocytes Absolute: 0.1 10*3/uL (ref 0.1–1.0)
Monocytes Relative: 1 %
Neutro Abs: 9.7 10*3/uL — ABNORMAL HIGH (ref 1.7–7.7)
Neutrophils Relative %: 84 %
Platelet Count: 219 10*3/uL (ref 150–400)
RBC: 4.85 MIL/uL (ref 3.87–5.11)
RDW: 13.2 % (ref 11.5–15.5)
WBC Count: 11.4 10*3/uL — ABNORMAL HIGH (ref 4.0–10.5)
nRBC: 0 % (ref 0.0–0.2)

## 2020-01-17 MED ORDER — SODIUM CHLORIDE 0.9% FLUSH
10.0000 mL | Freq: Once | INTRAVENOUS | Status: AC
  Administered 2020-01-17: 10 mL
  Filled 2020-01-17: qty 10

## 2020-01-17 MED ORDER — IOHEXOL 300 MG/ML  SOLN
100.0000 mL | Freq: Once | INTRAMUSCULAR | Status: AC | PRN
  Administered 2020-01-17: 100 mL via INTRAVENOUS

## 2020-01-17 MED ORDER — HEPARIN SOD (PORK) LOCK FLUSH 100 UNIT/ML IV SOLN
INTRAVENOUS | Status: AC
  Filled 2020-01-17: qty 5

## 2020-01-17 MED ORDER — HEPARIN SOD (PORK) LOCK FLUSH 100 UNIT/ML IV SOLN
500.0000 [IU] | Freq: Once | INTRAVENOUS | Status: AC
  Administered 2020-01-17: 500 [IU] via INTRAVENOUS

## 2020-01-17 NOTE — Patient Instructions (Signed)

## 2020-01-18 LAB — CA 125: Cancer Antigen (CA) 125: 135 U/mL — ABNORMAL HIGH (ref 0.0–38.1)

## 2020-01-21 ENCOUNTER — Other Ambulatory Visit: Payer: Self-pay

## 2020-01-21 ENCOUNTER — Encounter: Payer: Self-pay | Admitting: Hematology and Oncology

## 2020-01-21 ENCOUNTER — Inpatient Hospital Stay (HOSPITAL_BASED_OUTPATIENT_CLINIC_OR_DEPARTMENT_OTHER): Payer: Medicare HMO | Admitting: Hematology and Oncology

## 2020-01-21 DIAGNOSIS — C5701 Malignant neoplasm of right fallopian tube: Secondary | ICD-10-CM | POA: Diagnosis not present

## 2020-01-21 DIAGNOSIS — C561 Malignant neoplasm of right ovary: Secondary | ICD-10-CM | POA: Diagnosis not present

## 2020-01-21 DIAGNOSIS — K5909 Other constipation: Secondary | ICD-10-CM | POA: Diagnosis not present

## 2020-01-21 DIAGNOSIS — Z7189 Other specified counseling: Secondary | ICD-10-CM | POA: Diagnosis not present

## 2020-01-21 NOTE — Progress Notes (Signed)
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 I recommend consideration for carboplatin with gemcitabine or carboplatin with liposomal doxorubicin with future plan for addition of bevacizumab for maintenance treatment if she responds well to treatment We discussed the risk, benefits, side effects of each treatment options The patient is undecided She will call tomorrow for her final decision about plan of care and treatment options We discussed prognosis to be expected with or without treatment She is aware that treatment goal is palliative in nature  Other constipation She has some vague abdominal discomfort and mild intermittent constipation She will continue using MiraLAX  Goals of care, counseling/discussion We discussed the role of palliative treatment   No orders of the defined types were placed in this encounter.   All questions were answered. The patient knows to call the clinic with any problems, questions or concerns. The total time spent in the appointment was 40 minutes encounter with patients including review of chart and various tests results, discussions about plan of care and coordination of care plan   [name], MD 01/21/2020 1:16 PM  INTERVAL HISTORY: Please see below for problem oriented charting. She returns to review test results She has some vague suprapubic discomfort but denies abdominal bloating or significant changes in her bowel habits since her last time we met  SUMMARY OF ONCOLOGIC HISTORY: Oncology History Overview Note   High grade serous, right fallopian tube  HRD, BRCA tumor testing were neg   Ovarian cancer ([place])  03/21/2018 Initial Diagnosis   Ovarian cancer ([place])   Fallopian tube cancer, carcinoma, right ([place])  10/24/2017 Initial Diagnosis   She has presentation of vague abdominal pain   02/20/2018 Imaging   Outside CT abdomen and pelvis: This revealed an incidental finding of a 1.5 cm subcapsular lesion in the lateral upper pole of the left kidney which measured higher than fluid attenuation which could represent a proteinaceous renal cyst or solid renal mass.  There was no ascites.  There is no lymphadenopathy.  There were no masses in the pelvis including ovarian or adnexal masses seen.  There was however soft tissue stranding seen with nodularity in the omental fat in the lower abdomen without evidence of ascites.  This was felt to represent either carcinomatosis or inflammatory infectious etiologies.  There was colonic diverticulosis seen without radiographic evidence of diverticulitis.  The radiologist recommended MRI of the abdomen to further work-up the renal mass   02/22/2018 Tumor Marker   Patient's tumor was tested for the following markers: CA-125. Results of the tumor marker test revealed 103.   03/06/2018 Pathology Results   Omentum, biopsy - ADENOCARCINOMA WITH PSAMMOMA BODIES, SEE COMMENT. Microscopic Comment There are scattered small foci of malignant glands with associated psammoma bodies. While the tumor is somewhat limited the cells have a more low grade appearance. There is prominent lymphovascular invasion. Immunohistochemistry reveals the cells are positive for cytokeratin 7, ER, MOC31, PAX8, and WT-1. They are negative for calretinin, cytokeratin 20, CDX-2, and TTF-1. Overall, the findings are consistent with adenocarcinoma. The differential includes a primary gynecologic or peritoneal tumor, although the morphology is not typical  of a high grade serous carcinoma.   03/06/2018  Surgery   Surgeon: Donaciano Eva   Pre-operative Diagnosis: omental mass, elevated CA 125 Operation: Laparoscopic omentectomy  Surgeon: Donaciano Eva  Operative Findings:  : normal diaphragm and upper omentum. Sigmoid colon densely adherent to left pelvic side wall consistent with history of diverticulitis. Omentum adherent to anterior abdominal wall and sigmoid colon. Surgically absent uterus. Right ovary very small and grossly normal. Left ovary obscured by adhesed sigmoid colon. No ascites. No carcinomatosis. There was a nodular firm distal omentum on the left which was adherent to the sigmoid colon and most consistent with inflammatory change.      03/21/2018 Pathology Results   1. Soft tissue mass, simple excision, midline abdominal wall mass - METASTATIC SEROUS CARCINOMA. 2. Omentum, resection for tumor - METASTATIC SEROUS CARCINOMA. 3. Adnexa - ovary +/- tube, neoplastic, right - RIGHT FALLOPIAN TUBE: -HIGH GRADE SEROUS CARCINOMA. -SEE ONCOLOGY TABLE. - RIGHT OVARY: SURFACE PSAMMOMA BODIES. NO DEFINITIVE MALIGNANT CELLS. -INCLUSION CYSTS. 4. Adnexa - ovary +/- tube, neoplastic, left - LEFT FALLOPIAN TUBE: LUMINAL AND SURFACE SEROUS CARCINOMA. - LEFT OVARY: FOCAL PSAMMOMA BODIES AND MALIGNANT CELLS. 5. Soft tissue mass, simple excision, left abdominal wall - METASTATIC SEROUS CARCINOMA. Microscopic Comment 3. OVARY or FALLOPIAN TUBE or PRIMARY PERITONEUM: Procedure: Bilateral salpingo-oophorectomy. Abdominal wall mass excision and omentectomy. Specimen Integrity: Intact. Tumor Site: Right fallopian tube. Ovarian Surface Involvement (required only if applicable): Present (left). Fallopian Tube Surface Involvement (required only if applicable): Present. Tumor Size: 1.5 cm. Histologic Type: High grade serous carcinoma. Histologic Grade: High grade. Implants (required for advanced stage serous/seromucinous borderline tumors only): Present. Other Tissue/  Organ Involvement: Omentum, abdominal wall, left fallopian tube and ovary. Largest Extrapelvic Peritoneal Focus (required only if applicable): >2 cm. Peritoneal/Ascitic Fluid: N/A. Treatment Effect (required only for high-grade serous carcinomas): N/A. Regional Lymph Nodes: No lymph nodes submitted or found Pathologic Stage Classification (pTNM, AJCC 8th Edition): pT3c, pNX Representative Tumor Block: 3A Comment(s): None.   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   Surgeon: Thereasa Solo, MD. Specimens: Bilateral tubes / ovaries, omentum. Midline abdominal wall mass, left lateral abdominal wall mass.    Operative Findings:  Abdominal wall masses consistent with port site metastases at the umbilical incision (7cm) and left lateral incision (5cm). Omental caking in the infracolic omentum. Grossly normal very small tubes and ovaries. Normal diaphragms. No lymphadenopathy. Normal small and large intestine with the exception of miliary studding of tumor on the surface of the sigmoid colon and rectum in the cul de sac.    This represented an optimal cytoreduction (R1) with no gross visible disease remaining, however there was a thin rind of tumor on the lateral left fascia associated with the port site metastasis.     04/11/2018 Cancer Staging   Staging form: Ovary, Fallopian Tube, and Primary Peritoneal Carcinoma, AJCC 8th Edition - Pathologic: FIGO Stage IIIC (pT3, pN0, cM0) - Signed by [name], MD on 04/11/2018   05/23/2018 Imaging   1. Interval resolution of the anterior midline abdominal wall seroma. There is some persistent wispy soft tissue attenuation in the region of the midline incision, nonspecific and may simply reflect granulation tissue from surgery. 2.  Stable 13 mm exophytic lesion posterior left kidney with attenuation higher than expected for a simple cyst. This may be  a cyst complicated by proteinaceous debris or hemorrhage, but attention on follow-up recommended. 3. Stable mild persistent distention of proximal jejunal loops with gradual tapering to nondilated distal small bowel. 4. No overt peritoneal or mesenteric disease identified on this exam.   05/23/2018 Tumor Marker   Patient's tumor was tested for the following markers: CA-125 Results of the tumor marker test revealed 11.6   06/02/2018 - 09/20/2018 Chemotherapy   The patient had carboplatin and taxol   06/19/2018 Procedure   Status post right IJ port catheter placement. Catheter ready for use.   06/26/2018 Tumor Marker   Patient's tumor was tested for the following markers: CA-125. Results of the tumor marker test revealed 10.7   09/20/2018 Tumor Marker   Patient's tumor was tested for the following markers: CA-125. Results of the tumor marker test revealed 9.1   10/25/2018 Imaging   1. There is suspicious, abnormal asymmetric increased soft tissue involving the cecum and ascending colon. This is suspicious for residual/progressive serosal involvement by tumor. 2. No additional sites of disease identified. No evidence for nodal metastasis, solid organ metastasis or ascites.     11/07/2018 Procedure   She had negative colonoscopy evaluation   12/05/2018 Tumor Marker   Patient's tumor was tested for the following markers: CA-125 Results of the tumor marker test revealed 8.6   01/25/2019 Imaging   Ct abdomen and pelvis Signs of omentectomy and hysterectomy without definitive signs of residual recurrent disease. Small nodular focus bridging rectum and vagina is stable dating back January to 12/14/2018, potentially postoperative but suggest attention on subsequent imaging.   01/25/2019 Tumor Marker   Patient's tumor was tested for the following markers: CA-125 Results of the  tumor marker test revealed 10.3   03/15/2019 Tumor Marker   Patient's tumor was tested for the following markers: CA-125 Results of the tumor marker test revealed 9.6   03/23/2019 Imaging   No acute findings in the abdomen or pelvis.   Colonic diverticulosis.  No active diverticulitis.   Prior open tectum E and hysterectomy.   Aortic atherosclerosis.     05/10/2019 Tumor Marker   Patient's tumor was tested for the following markers: CA-125 Results of the tumor marker test revealed 11.8   05/22/2019 Imaging   1. Stable exam. No evidence of recurrent or metastatic carcinoma within the abdomen or pelvis. 2. Colonic diverticulosis, without radiographic evidence of diverticulitis.     11/08/2019 Imaging   1. Status post hysterectomy and bilateral oophorectomy. 2. Soft tissue thickening within the deep pelvis which when compared to multiple prior exams is felt to be slowly progressive. Especially given the elevated CA 125 level, this is suspicious for peritoneal recurrence. Potential imaging strategies include PET (which may be of low sensitivity secondary to the lack of well-defined dominant mass) or pelvic CT follow-up at 3 months. 3. Otherwise, no evidence of metastatic disease within the chest, abdomen, or pelvis. 4. Coronary artery atherosclerosis. Aortic Atherosclerosis (ICD10-I70.0). 5. Ventral abdominal wall hernia containing nonobstructive transverse colon, as before. 6.  Possible constipation.   12/21/2019 Tumor Marker   Patient's tumor was tested for the following markers: 95.2. Results of the tumor marker test revealed CA-`125.   01/17/2020 Imaging   1. Similar appearance of nodularity in the central pelvis along the vaginal cuff. There is a particular nodular soft tissue density measuring 2.2 x 1.6 cm along the wall of the proximal rectum that is slightly more conspicuous than previously, raising concern for possible minimal progression. Similar appearance of the  other areas  of nodular soft tissue in the central pelvis. PET-CT may prove helpful to further evaluate. 2. No ascites. 3. Left colonic diverticulosis without diverticulitis. 4. 1.7 cm exophytic lesion upper pole left kidney with well-defined homogeneous attenuation slightly higher than would be expected for simple fluid. This is probably a complex cyst. Attention on follow-up recommended. 5. Aortic Atherosclerosis (ICD10-I70.0).   01/17/2020 Tumor Marker   Patient's tumor was tested for the following markers: CA-125 Results of the tumor marker test revealed 135     REVIEW OF SYSTEMS:   Constitutional: Denies fevers, chills or abnormal weight loss Eyes: Denies blurriness of vision Ears, nose, mouth, throat, and face: Denies mucositis or sore throat Respiratory: Denies cough, dyspnea or wheezes Cardiovascular: Denies palpitation, chest discomfort or lower extremity swelling Gastrointestinal:  Denies nausea, heartburn or change in bowel habits Skin: Denies abnormal skin rashes Lymphatics: Denies new lymphadenopathy or easy bruising Neurological:Denies numbness, tingling or new weaknesses Behavioral/Psych: Mood is stable, no new changes  All other systems were reviewed with the patient and are negative.  I have reviewed the past medical history, past surgical history, social history and family history with the patient and they are unchanged from previous note.  ALLERGIES:  is allergic to ivp dye [iodinated diagnostic agents], propoxyphene, codeine, and ultram [tramadol hcl].  MEDICATIONS:  Current Outpatient Medications  Medication Sig Dispense Refill  . Biotin 1 MG CAPS Take 1 tablet by mouth daily.    . carboxymethylcellulose (REFRESH PLUS) 0.5 % SOLN Place 1 drop into both eyes 3 (three) times daily as needed (for dry eyes.).    Marland Kitchen Cholecalciferol (VITAMIN D3 SUPER STRENGTH) 50 MCG (2000 UT) TABS Take 2,000 Units by mouth daily.    . Elastic Bandages & Supports (ABDOMINAL BINDER/ELASTIC MED) MISC  1 Device by Does not apply route daily as needed. 1 each 1  . ibuprofen (ADVIL) 400 MG tablet Take 400 mg by mouth 2 (two) times daily.    Marland Kitchen lubiprostone (AMITIZA) 8 MCG capsule Take 1 capsule (8 mcg total) by mouth 2 (two) times daily with a meal. Pharmacy- d/c rx for linzess. Not covered well by insurance 60 capsule 1  . polyethylene glycol (MIRALAX / GLYCOLAX) 17 g packet Take 17 g by mouth daily as needed. (Patient not taking: Reported on 10/25/2019)    . predniSONE (DELTASONE) 50 MG tablet Take 1 pill at 13 hours, 7 hours and 1 hour before CT scan 3 tablet 0  . triamcinolone cream (KENALOG) 0.5 % Apply 1 application topically 2 (two) times daily. Apply to eyelids/eyebrows  4   No current facility-administered medications for this visit.    PHYSICAL EXAMINATION: ECOG PERFORMANCE STATUS: 1 - Symptomatic but completely ambulatory  Vitals:   01/21/20 1014  BP: (!) 181/91  Pulse: 67  Resp: 18  Temp: 97.8 F (36.6 C)  SpO2: 98%   Filed Weights   01/21/20 1014  Weight: 156 lb 12.8 oz (71.1 kg)    GENERAL:alert, no distress and comfortable  NEURO: alert & oriented x 3 with fluent speech, no focal motor/sensory deficits  LABORATORY DATA:  I have reviewed the data as listed    Component Value Date/Time   NA 138 01/17/2020 1339   K 3.8 01/17/2020 1339   CL 104 01/17/2020 1339   CO2 27 01/17/2020 1339   GLUCOSE 141 (H) 01/17/2020 1339   BUN 16 01/17/2020 1339   CREATININE 0.73 01/17/2020 1339   CALCIUM 10.0 01/17/2020 1339   PROT 7.7 01/17/2020 1339  ALBUMIN 4.0 01/17/2020 1339   AST 17 01/17/2020 1339   ALT 17 01/17/2020 1339   ALKPHOS 71 01/17/2020 1339   BILITOT 0.3 01/17/2020 1339   GFRNONAA >60 01/17/2020 1339   GFRAA >60 01/17/2020 1339    No results found for: SPEP, UPEP  Lab Results  Component Value Date   WBC 11.4 (H) 01/17/2020   NEUTROABS 9.7 (H) 01/17/2020   HGB 13.8 01/17/2020   HCT 41.9 01/17/2020   MCV 86.4 01/17/2020   PLT 219 01/17/2020       Chemistry      Component Value Date/Time   NA 138 01/17/2020 1339   K 3.8 01/17/2020 1339   CL 104 01/17/2020 1339   CO2 27 01/17/2020 1339   BUN 16 01/17/2020 1339   CREATININE 0.73 01/17/2020 1339      Component Value Date/Time   CALCIUM 10.0 01/17/2020 1339   ALKPHOS 71 01/17/2020 1339   AST 17 01/17/2020 1339   ALT 17 01/17/2020 1339   BILITOT 0.3 01/17/2020 1339       RADIOGRAPHIC STUDIES: I have reviewed multiple imaging studies with the patient and her husband I have personally reviewed the radiological images as listed and agreed with the findings in the report. CT ABDOMEN PELVIS W CONTRAST  Result Date: 01/18/2020 CLINICAL DATA:  Ovarian cancer.  Restaging. EXAM: CT ABDOMEN AND PELVIS WITH CONTRAST TECHNIQUE: Multidetector CT imaging of the abdomen and pelvis was performed using the standard protocol following bolus administration of intravenous contrast. CONTRAST:  163m OMNIPAQUE IOHEXOL 300 MG/ML  SOLN COMPARISON:  11/08/2019 FINDINGS: Lower chest: Unremarkable. Hepatobiliary: No suspicious focal abnormality within the liver parenchyma. There is no evidence for gallstones, gallbladder wall thickening, or pericholecystic fluid. No intrahepatic or extrahepatic biliary dilation. Pancreas: No focal mass lesion. No dilatation of the main duct. No intraparenchymal cyst. No peripancreatic edema. Spleen: No splenomegaly. No focal mass lesion. Adrenals/Urinary Tract: No adrenal nodule or mass. Right kidney unremarkable. 1.7 cm exophytic lesion upper pole left kidney is similar to prior, wall well-defined homogeneous attenuation slightly higher than would be expected for simple fluid. This is probably a complex cyst. A several additional very tiny hypoattenuating cortical lesions in the left kidney are too small to characterize but likely benign. Insert normal ureter The urinary bladder appears normal for the degree of distention. Stomach/Bowel: Stomach is unremarkable. No gastric wall  thickening. No evidence of outlet obstruction. Duodenum is normally positioned as is the ligament of Treitz. No small bowel wall thickening. No small bowel dilatation. The terminal ileum is normal. The appendix is not visualized, but there is no edema or inflammation in the region of the cecum. No gross colonic mass. No colonic wall thickening. Diverticular changes are noted in the left colon without evidence of diverticulitis. Vascular/Lymphatic: There is abdominal aortic atherosclerosis without aneurysm. There is no gastrohepatic or hepatoduodenal ligament lymphadenopathy. No retroperitoneal or mesenteric lymphadenopathy. No pelvic sidewall lymphadenopathy. Reproductive: The uterus is surgically absent. There is no adnexal mass. Other: Nodularity in the central pelvis along the vaginal cuff is similar to prior. There is a particular nodular soft tissue density measuring 2.2 x 1.6 cm along the wall of the proximal rectum that is slightly more conspicuous than previously (see image 72/2). Focal soft tissue attenuation posterior to the cecum measuring 2.6 x 2.3 cm on 70/2 is similar to prior. Similar 17 mm soft tissue nodule along the distal sigmoid colon (70/2). Musculoskeletal: No worrisome lytic or sclerotic osseous abnormality. IMPRESSION: 1. Similar appearance of nodularity  in the central pelvis along the vaginal cuff. There is a particular nodular soft tissue density measuring 2.2 x 1.6 cm along the wall of the proximal rectum that is slightly more conspicuous than previously, raising concern for possible minimal progression. Similar appearance of the other areas of nodular soft tissue in the central pelvis. PET-CT may prove helpful to further evaluate. 2. No ascites. 3. Left colonic diverticulosis without diverticulitis. 4. 1.7 cm exophytic lesion upper pole left kidney with well-defined homogeneous attenuation slightly higher than would be expected for simple fluid. This is probably a complex cyst. Attention  on follow-up recommended. 5. Aortic Atherosclerosis (ICD10-I70.0). Electronically Signed   By: Misty Stanley M.D.   On: 01/18/2020 07:19

## 2020-01-21 NOTE — Assessment & Plan Note (Addendum)
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

## 2020-01-21 NOTE — Assessment & Plan Note (Signed)
She has some vague abdominal discomfort and mild intermittent constipation She will continue using MiraLAX

## 2020-01-21 NOTE — Assessment & Plan Note (Signed)
We discussed the role of palliative treatment

## 2020-01-22 ENCOUNTER — Telehealth: Payer: Self-pay | Admitting: Oncology

## 2020-01-22 ENCOUNTER — Other Ambulatory Visit: Payer: Self-pay | Admitting: Hematology and Oncology

## 2020-01-22 DIAGNOSIS — C569 Malignant neoplasm of unspecified ovary: Secondary | ICD-10-CM

## 2020-01-22 DIAGNOSIS — Z5111 Encounter for antineoplastic chemotherapy: Secondary | ICD-10-CM | POA: Insufficient documentation

## 2020-01-22 DIAGNOSIS — Z7189 Other specified counseling: Secondary | ICD-10-CM

## 2020-01-22 DIAGNOSIS — C5701 Malignant neoplasm of right fallopian tube: Secondary | ICD-10-CM

## 2020-01-22 MED ORDER — ONDANSETRON HCL 8 MG PO TABS: 8.0000 mg | ORAL_TABLET | Freq: Two times a day (BID) | ORAL | 1 refills | Status: DC | PRN

## 2020-01-22 MED ORDER — PROCHLORPERAZINE MALEATE 10 MG PO TABS: 10.0000 mg | ORAL_TABLET | Freq: Four times a day (QID) | ORAL | 1 refills | Status: DC | PRN

## 2020-01-22 NOTE — Telephone Encounter (Signed)
Misty Hopkins with appointment for the echocardiogram on 01/30/20 at 10 am at [facility] and labs to follow at 11:30 at the [facility].  Also advised her that the schedulers will call her with the appointment for chemotherapy on 02/04/20.  She verbalized understanding and agreement of appointments and plan.

## 2020-01-22 NOTE — Telephone Encounter (Signed)
Called [place] back and discussed message below from Dr. Alvy Bimler.  She said if it is ok with Dr. Alvy Bimler, she would like to have the echo next week and start chemo the week of 10/11 when her husband is back.  She said she can manage the constipation with miralax and Senakot until then.

## 2020-01-22 NOTE — Telephone Encounter (Signed)
Misty Hopkins called and said she has made a decision and would like to receive carboplatin and liposomal doxorubicin (option 2). Her husband has a trip planned to [place] next week and she is wondering if he needs to cancel it or if she can start the week of 10/11.    She also has trouble with constipation and is worried it will be worse with chemo.  It was recommended by Dr. Norman Herrlich with GI that she take Linzess but it was not covered by her insurance and would be $300.00.  She is wondering if she does need to have it filled or if it would be ok to continue with Miralax 1-2 times a day and add Senakot if needed.

## 2020-01-22 NOTE — Telephone Encounter (Signed)
Her constipation is partially caused by her cancer; she can take miralax and sennokot The longer she waits to start, she might have to deal with her constipation longer She needs pretreatment ECHO and I have to place orders for start date, let me know what is her final decision

## 2020-01-22 NOTE — Progress Notes (Signed)
DISCONTINUE ON PATHWAY REGIMEN - Ovarian     A cycle is every 21 days:     Paclitaxel      Carboplatin   **Always confirm dose/schedule in your pharmacy ordering system**  REASON: Other Reason PRIOR TREATMENT: [id number]: Carboplatin AUC=6 + Paclitaxel 175 mg/m2 q21 Days x 6 Cycles for Complete Responders or 2 Cycles Past Best Response for Partial Responders TREATMENT RESPONSE: Complete Response (CR)  START ON PATHWAY REGIMEN - Ovarian     A cycle is every 28 days:     Carboplatin      Liposomal doxorubicin   **Always confirm dose/schedule in your pharmacy ordering system**  Patient Characteristics: Recurrent or Progressive Disease, Second Line, Platinum Sensitive and ? 6 Months Since Last Therapy, Not a Candidate for Secondary Debulking Surgery BRCA Mutation Status: Absent Therapeutic Status: Recurrent or Progressive Disease Line of Therapy: Second Line  Intent of Therapy: Non-Curative / Palliative Intent, Discussed with Patient

## 2020-01-22 NOTE — Telephone Encounter (Signed)
Order for echo is in, please schedule I will send chemo to start 10/11, would be helpful for her to get labs drawn next week when she comes in for echo so it is done and she can just start treatment without labs on 10/11 I will see her on Nov 8th for cycle 2

## 2020-01-24 ENCOUNTER — Telehealth: Payer: Self-pay | Admitting: Hematology and Oncology

## 2020-01-24 NOTE — Telephone Encounter (Signed)
Scheduled appt per 9/28 sch msg - pt aware of appt scheduled appt on 10/11

## 2020-01-25 ENCOUNTER — Telehealth: Payer: Self-pay

## 2020-01-25 NOTE — Telephone Encounter (Signed)
Misty Hopkins called back and lab/flush appointment was canceled for 02/04/20.  Also advised her that we will call her back with appointment to see Dr. Alvy Bimler before treatment on 02/04/20.  She verbalized agreement and understanding.

## 2020-01-25 NOTE — Telephone Encounter (Signed)
Called and left a message asking her to call the office back. She is scheduled to see Dr. Alvy Bimler on 10/11 at 1230 prior to infusion appt.

## 2020-01-25 NOTE — Telephone Encounter (Signed)
-----   Message from [name], MD sent at 01/25/2020  8:14 AM EDT ----- Regarding: double lab I saw lab on same day as echo and lab again before chemo. We can just do the lab same day as echo and cancel the one before chemo Can you ask her if she wants to see me same day before chemo?

## 2020-01-25 NOTE — Telephone Encounter (Signed)
She called back and left a message that she is aware of appt added with Dr. Alvy Bimler.

## 2020-01-25 NOTE — Telephone Encounter (Signed)
Called and left below message and ask her to call the office back. 

## 2020-01-30 ENCOUNTER — Ambulatory Visit (HOSPITAL_COMMUNITY)
Admission: RE | Admit: 2020-01-30 | Discharge: 2020-01-30 | Disposition: A | Discharge status: Home / Self Care | Payer: Medicare HMO | Source: Ambulatory Visit | Attending: Hematology and Oncology | Admitting: Hematology and Oncology

## 2020-01-30 ENCOUNTER — Other Ambulatory Visit: Payer: Self-pay

## 2020-01-30 ENCOUNTER — Inpatient Hospital Stay: Payer: Medicare HMO | Attending: Hematology and Oncology

## 2020-01-30 ENCOUNTER — Other Ambulatory Visit: Payer: Self-pay | Admitting: Hematology and Oncology

## 2020-01-30 DIAGNOSIS — G893 Neoplasm related pain (acute) (chronic): Secondary | ICD-10-CM | POA: Diagnosis not present

## 2020-01-30 DIAGNOSIS — C561 Malignant neoplasm of right ovary: Secondary | ICD-10-CM | POA: Diagnosis present

## 2020-01-30 DIAGNOSIS — I10 Essential (primary) hypertension: Secondary | ICD-10-CM | POA: Insufficient documentation

## 2020-01-30 DIAGNOSIS — C792 Secondary malignant neoplasm of skin: Secondary | ICD-10-CM | POA: Insufficient documentation

## 2020-01-30 DIAGNOSIS — C569 Malignant neoplasm of unspecified ovary: Secondary | ICD-10-CM | POA: Diagnosis not present

## 2020-01-30 DIAGNOSIS — Z79899 Other long term (current) drug therapy: Secondary | ICD-10-CM | POA: Diagnosis not present

## 2020-01-30 DIAGNOSIS — Z0189 Encounter for other specified special examinations: Secondary | ICD-10-CM

## 2020-01-30 DIAGNOSIS — K5730 Diverticulosis of large intestine without perforation or abscess without bleeding: Secondary | ICD-10-CM | POA: Diagnosis not present

## 2020-01-30 DIAGNOSIS — I700 Atherosclerosis of aorta: Secondary | ICD-10-CM | POA: Diagnosis not present

## 2020-01-30 DIAGNOSIS — C5701 Malignant neoplasm of right fallopian tube: Secondary | ICD-10-CM | POA: Diagnosis present

## 2020-01-30 DIAGNOSIS — K5909 Other constipation: Secondary | ICD-10-CM | POA: Insufficient documentation

## 2020-01-30 DIAGNOSIS — Z7189 Other specified counseling: Secondary | ICD-10-CM | POA: Diagnosis not present

## 2020-01-30 DIAGNOSIS — Z5111 Encounter for antineoplastic chemotherapy: Secondary | ICD-10-CM | POA: Insufficient documentation

## 2020-01-30 DIAGNOSIS — Z885 Allergy status to narcotic agent status: Secondary | ICD-10-CM | POA: Insufficient documentation

## 2020-01-30 LAB — CMP (CANCER CENTER ONLY)
ALT: 16 U/L (ref 0–44)
AST: 16 U/L (ref 15–41)
Albumin: 3.7 g/dL (ref 3.5–5.0)
Alkaline Phosphatase: 64 U/L (ref 38–126)
Anion gap: 4 — ABNORMAL LOW (ref 5–15)
BUN: 12 mg/dL (ref 8–23)
CO2: 29 mmol/L (ref 22–32)
Calcium: 9.5 mg/dL (ref 8.9–10.3)
Chloride: 107 mmol/L (ref 98–111)
Creatinine: 0.74 mg/dL (ref 0.44–1.00)
GFR, Estimated: >60 mL/min (ref >60)
Glucose, Bld: 150 mg/dL — ABNORMAL HIGH (ref 70–99)
Potassium: 3.7 mmol/L (ref 3.5–5.1)
Sodium: 140 mmol/L (ref 135–145)
Total Bilirubin: 0.5 mg/dL (ref 0.3–1.2)
Total Protein: 7 g/dL (ref 6.5–8.1)

## 2020-01-30 LAB — CBC WITH DIFFERENTIAL (CANCER CENTER ONLY)
Abs Immature Granulocytes: 0.03 10*3/uL (ref 0.00–0.07)
Basophils Absolute: 0.1 10*3/uL (ref 0.0–0.1)
Basophils Relative: 1 %
Eosinophils Absolute: 0.2 10*3/uL (ref 0.0–0.5)
Eosinophils Relative: 2 %
HCT: 42.1 % (ref 36.0–46.0)
Hemoglobin: 13.5 g/dL (ref 12.0–15.0)
Immature Granulocytes: 0 %
Lymphocytes Relative: 30 %
Lymphs Abs: 3.1 10*3/uL (ref 0.7–4.0)
MCH: 27.8 pg (ref 26.0–34.0)
MCHC: 32.1 g/dL (ref 30.0–36.0)
MCV: 86.6 fL (ref 80.0–100.0)
Monocytes Absolute: 0.7 10*3/uL (ref 0.1–1.0)
Monocytes Relative: 7 %
Neutro Abs: 6.2 10*3/uL (ref 1.7–7.7)
Neutrophils Relative %: 60 %
Platelet Count: 197 10*3/uL (ref 150–400)
RBC: 4.86 MIL/uL (ref 3.87–5.11)
RDW: 13.7 % (ref 11.5–15.5)
WBC Count: 10.1 10*3/uL (ref 4.0–10.5)
nRBC: 0 % (ref 0.0–0.2)

## 2020-01-30 LAB — ECHOCARDIOGRAM COMPLETE
Area-P 1/2: 4.64 cm2
S' Lateral: 2.9 cm
Single Plane A4C EF: 58.9 %

## 2020-01-30 NOTE — Progress Notes (Signed)
  Echocardiogram 2D Echocardiogram has been performed.  Misty Hopkins 01/30/2020, 10:46 AM

## 2020-02-04 ENCOUNTER — Other Ambulatory Visit: Payer: Self-pay | Admitting: Hematology and Oncology

## 2020-02-04 ENCOUNTER — Other Ambulatory Visit: Payer: Self-pay

## 2020-02-04 ENCOUNTER — Encounter: Payer: Self-pay | Admitting: Hematology and Oncology

## 2020-02-04 ENCOUNTER — Inpatient Hospital Stay (HOSPITAL_BASED_OUTPATIENT_CLINIC_OR_DEPARTMENT_OTHER): Payer: Medicare HMO | Admitting: Hematology and Oncology

## 2020-02-04 ENCOUNTER — Other Ambulatory Visit: Payer: Medicare HMO

## 2020-02-04 ENCOUNTER — Inpatient Hospital Stay: Payer: Medicare HMO

## 2020-02-04 VITALS — BP 157/86 | HR 75 | Temp 99.4°F | Resp 18 | Ht 63.0 in | Wt 154.4 lb

## 2020-02-04 DIAGNOSIS — G893 Neoplasm related pain (acute) (chronic): Secondary | ICD-10-CM

## 2020-02-04 DIAGNOSIS — Z7189 Other specified counseling: Secondary | ICD-10-CM

## 2020-02-04 DIAGNOSIS — C5701 Malignant neoplasm of right fallopian tube: Secondary | ICD-10-CM | POA: Diagnosis not present

## 2020-02-04 DIAGNOSIS — C569 Malignant neoplasm of unspecified ovary: Secondary | ICD-10-CM

## 2020-02-04 DIAGNOSIS — Z5111 Encounter for antineoplastic chemotherapy: Secondary | ICD-10-CM | POA: Diagnosis not present

## 2020-02-04 MED ORDER — HEPARIN SOD (PORK) LOCK FLUSH 100 UNIT/ML IV SOLN
500.0000 [IU] | Freq: Once | INTRAVENOUS | Status: AC | PRN
  Administered 2020-02-04: 500 [IU]
  Filled 2020-02-04: qty 5

## 2020-02-04 MED ORDER — SODIUM CHLORIDE 0.9% FLUSH
10.0000 mL | INTRAVENOUS | Status: DC | PRN
  Administered 2020-02-04: 10 mL
  Filled 2020-02-04: qty 10

## 2020-02-04 MED ORDER — OXYCODONE HCL 5 MG PO TABS: 5.0000 mg | ORAL_TABLET | ORAL | 0 refills | Status: DC | PRN

## 2020-02-04 MED ORDER — PALONOSETRON HCL INJECTION 0.25 MG/5ML
INTRAVENOUS | Status: AC
  Filled 2020-02-04: qty 5

## 2020-02-04 MED ORDER — PALONOSETRON HCL INJECTION 0.25 MG/5ML
0.2500 mg | Freq: Once | INTRAVENOUS | Status: AC
  Administered 2020-02-04: 0.25 mg via INTRAVENOUS

## 2020-02-04 MED ORDER — SODIUM CHLORIDE 0.9 % IV SOLN
10.0000 mg | Freq: Once | INTRAVENOUS | Status: AC
  Administered 2020-02-04: 10 mg via INTRAVENOUS
  Filled 2020-02-04: qty 10

## 2020-02-04 MED ORDER — SODIUM CHLORIDE 0.9 % IV SOLN
389.5000 mg | Freq: Once | INTRAVENOUS | Status: AC
  Administered 2020-02-04: 390 mg via INTRAVENOUS
  Filled 2020-02-04: qty 39

## 2020-02-04 MED ORDER — DEXTROSE 5 % IV SOLN
Freq: Once | INTRAVENOUS | Status: AC
  Filled 2020-02-04: qty 250

## 2020-02-04 MED ORDER — DOXORUBICIN HCL LIPOSOMAL CHEMO INJECTION 2 MG/ML
28.0000 mg/m2 | Freq: Once | INTRAVENOUS | Status: AC
  Administered 2020-02-04: 50 mg via INTRAVENOUS
  Filled 2020-02-04: qty 25

## 2020-02-04 MED ORDER — SODIUM CHLORIDE 0.9 % IV SOLN
150.0000 mg | Freq: Once | INTRAVENOUS | Status: AC
  Administered 2020-02-04: 150 mg via INTRAVENOUS
  Filled 2020-02-04: qty 150

## 2020-02-04 MED FILL — oxyCODONE HCL 5 MG TABS: 5 | 5 days supply | Qty: 30 | Fill #0

## 2020-02-04 NOTE — Patient Instructions (Signed)
[facility] Discharge Instructions for Patients Receiving Chemotherapy  Today you received the following chemotherapy agents: doxil and carboplatin.  To help prevent nausea and vomiting after your treatment, we encourage you to take your nausea medication as directed.   If you develop nausea and vomiting that is not controlled by your nausea medication, call the clinic.   BELOW ARE SYMPTOMS THAT SHOULD BE REPORTED IMMEDIATELY:  *FEVER GREATER THAN 100.5 F  *CHILLS WITH OR WITHOUT FEVER  NAUSEA AND VOMITING THAT IS NOT CONTROLLED WITH YOUR NAUSEA MEDICATION  *UNUSUAL SHORTNESS OF BREATH  *UNUSUAL BRUISING OR BLEEDING  TENDERNESS IN MOUTH AND THROAT WITH OR WITHOUT PRESENCE OF ULCERS  *URINARY PROBLEMS  *BOWEL PROBLEMS  UNUSUAL RASH Items with * indicate a potential emergency and should be followed up as soon as possible.  Feel free to call the clinic should you have any questions or concerns. The clinic phone number is [phone].  Please show the [facility] at check-in to the Emergency Department and triage nurse.  Doxorubicin Liposomal injection What is this medicine? LIPOSOMAL DOXORUBICIN (LIP oh som al dox oh ROO bi sin) is a chemotherapy drug. This medicine is used to treat many kinds of cancer like Kaposi's sarcoma, multiple myeloma, and ovarian cancer. This medicine may be used for other purposes; ask your health care provider or pharmacist if you have questions. COMMON BRAND NAME(S): Doxil, Lipodox What should I tell my health care provider before I take this medicine? They need to know if you have any of these conditions:  blood disorders  heart disease  infection (especially a virus infection such as chickenpox, cold sores, or herpes)  liver disease  recent or ongoing radiation therapy  an unusual or allergic reaction to doxorubicin, other chemotherapy agents, soybeans, other medicines, foods, dyes, or preservatives  pregnant or  trying to get pregnant  breast-feeding How should I use this medicine? This drug is given as an infusion into a vein. It is administered in a hospital or clinic by a specially trained health care professional. If you have pain, swelling, burning or any unusual feeling around the site of your injection, tell your health care professional right away. Talk to your pediatrician regarding the use of this medicine in children. Special care may be needed. Overdosage: If you think you have taken too much of this medicine contact a poison control center or emergency room at once. NOTE: This medicine is only for you. Do not share this medicine with others. What if I miss a dose? It is important not to miss your dose. Call your doctor or health care professional if you are unable to keep an appointment. What may interact with this medicine? Do not take this medicine with any of the following medications:  zidovudine This medicine may also interact with the following medications:  medicines to increase blood counts like filgrastim, pegfilgrastim, sargramostim  vaccines Talk to your doctor or health care professional before taking any of these medicines:  acetaminophen  aspirin  ibuprofen  ketoprofen  naproxen This list may not describe all possible interactions. Give your health care provider a list of all the medicines, herbs, non-prescription drugs, or dietary supplements you use. Also tell them if you smoke, drink alcohol, or use illegal drugs. Some items may interact with your medicine. What should I watch for while using this medicine? Your condition will be monitored carefully while you are receiving this medicine. You may need blood work done while you are taking  this medicine. This drug may make you feel generally unwell. This is not uncommon, as chemotherapy can affect healthy cells as well as cancer cells. Report any side effects. Continue your course of treatment even though you feel  ill unless your doctor tells you to stop. Your urine may turn orange-red for a few days after your dose. This is not blood. If your urine is dark or brown, call your doctor. In some cases, you may be given additional medicines to help with side effects. Follow all directions for their use. Talk to your doctor about your risk of cancer. You may be more at risk for certain types of cancers if you take this medicine. Do not become pregnant while taking this medicine or for 6 months after stopping it. Women should inform their healthcare professional if they wish to become pregnant or think they may be pregnant. Men should not father a child while taking this medicine and for 6 months after stopping it. There is a potential for serious side effects to an unborn child. Talk to your health care professional or pharmacist for more information. Do not breast-feed an infant while taking this medicine. This medicine has caused ovarian failure in some women. This medicine may make it more difficult to get pregnant. Talk to your healthcare professional if you are concerned about your fertility. This medicine has caused decreased sperm counts in some men. This may make it more difficult to father a child. Talk to your healthcare professional if you are concerned about your fertility. This medicine may cause a decrease in Co-Enzyme Q-10. You should make sure that you get enough Co-Enzyme Q-10 while you are taking this medicine. Discuss the foods you eat and the vitamins you take with your health care professional. What side effects may I notice from receiving this medicine? Side effects that you should report to your doctor or health care professional as soon as possible:  allergic reactions like skin rash, itching or hives, swelling of the face, lips, or tongue  low blood counts - this medicine may decrease the number of white blood cells, red blood cells and platelets. You may be at increased risk for infections  and bleeding.  signs of hand-foot syndrome - tingling or burning, redness, flaking, swelling, small blisters, or small sores on the palms of your hands or the soles of your feet  signs of infection - fever or chills, cough, sore throat, pain or difficulty passing urine  signs of decreased platelets or bleeding - bruising, pinpoint red spots on the skin, black, tarry stools, blood in the urine  signs of decreased red blood cells - unusually weak or tired, fainting spells, lightheadedness  back pain, chills, facial flushing, fever, headache, tightness in the chest or throat during the infusion  breathing problems  chest pain  fast, irregular heartbeat  mouth pain, redness, sores  pain, swelling, redness at site where injected  pain, tingling, numbness in the hands or feet  swelling of ankles, feet, or hands  vomiting Side effects that usually do not require medical attention (report to your doctor or health care professional if they continue or are bothersome):  diarrhea  hair loss  loss of appetite  nail discoloration or damage  nausea  red or watery eyes  red colored urine  stomach upset This list may not describe all possible side effects. Call your doctor for medical advice about side effects. You may report side effects to FDA at 1-800-FDA-1088. Where should I keep my  medicine? This drug is given in a hospital or clinic and will not be stored at home. NOTE: This sheet is a summary. It may not cover all possible information. If you have questions about this medicine, talk to your doctor, pharmacist, or health care provider.  2020 Elsevier/Gold Standard (2017-12-19 15:13:26)  Carboplatin injection What is this medicine? CARBOPLATIN (KAR boe pla tin) is a chemotherapy drug. It targets fast dividing cells, like cancer cells, and causes these cells to die. This medicine is used to treat ovarian cancer and many other cancers. This medicine may be used for other  purposes; ask your health care provider or pharmacist if you have questions. COMMON BRAND NAME(S): Paraplatin What should I tell my health care provider before I take this medicine? They need to know if you have any of these conditions:  blood disorders  hearing problems  kidney disease  recent or ongoing radiation therapy  an unusual or allergic reaction to carboplatin, cisplatin, other chemotherapy, other medicines, foods, dyes, or preservatives  pregnant or trying to get pregnant  breast-feeding How should I use this medicine? This drug is usually given as an infusion into a vein. It is administered in a hospital or clinic by a specially trained health care professional. Talk to your pediatrician regarding the use of this medicine in children. Special care may be needed. Overdosage: If you think you have taken too much of this medicine contact a poison control center or emergency room at once. NOTE: This medicine is only for you. Do not share this medicine with others. What if I miss a dose? It is important not to miss a dose. Call your doctor or health care professional if you are unable to keep an appointment. What may interact with this medicine?  medicines for seizures  medicines to increase blood counts like filgrastim, pegfilgrastim, sargramostim  some antibiotics like amikacin, gentamicin, neomycin, streptomycin, tobramycin  vaccines Talk to your doctor or health care professional before taking any of these medicines:  acetaminophen  aspirin  ibuprofen  ketoprofen  naproxen This list may not describe all possible interactions. Give your health care provider a list of all the medicines, herbs, non-prescription drugs, or dietary supplements you use. Also tell them if you smoke, drink alcohol, or use illegal drugs. Some items may interact with your medicine. What should I watch for while using this medicine? Your condition will be monitored carefully while you  are receiving this medicine. You will need important blood work done while you are taking this medicine. This drug may make you feel generally unwell. This is not uncommon, as chemotherapy can affect healthy cells as well as cancer cells. Report any side effects. Continue your course of treatment even though you feel ill unless your doctor tells you to stop. In some cases, you may be given additional medicines to help with side effects. Follow all directions for their use. Call your doctor or health care professional for advice if you get a fever, chills or sore throat, or other symptoms of a cold or flu. Do not treat yourself. This drug decreases your body's ability to fight infections. Try to avoid being around people who are sick. This medicine may increase your risk to bruise or bleed. Call your doctor or health care professional if you notice any unusual bleeding. Be careful brushing and flossing your teeth or using a toothpick because you may get an infection or bleed more easily. If you have any dental work done, tell your dentist you  are receiving this medicine. Avoid taking products that contain aspirin, acetaminophen, ibuprofen, naproxen, or ketoprofen unless instructed by your doctor. These medicines may hide a fever. Do not become pregnant while taking this medicine. Women should inform their doctor if they wish to become pregnant or think they might be pregnant. There is a potential for serious side effects to an unborn child. Talk to your health care professional or pharmacist for more information. Do not breast-feed an infant while taking this medicine. What side effects may I notice from receiving this medicine? Side effects that you should report to your doctor or health care professional as soon as possible:  allergic reactions like skin rash, itching or hives, swelling of the face, lips, or tongue  signs of infection - fever or chills, cough, sore throat, pain or difficulty passing  urine  signs of decreased platelets or bleeding - bruising, pinpoint red spots on the skin, black, tarry stools, nosebleeds  signs of decreased red blood cells - unusually weak or tired, fainting spells, lightheadedness  breathing problems  changes in hearing  changes in vision  chest pain  high blood pressure  low blood counts - This drug may decrease the number of white blood cells, red blood cells and platelets. You may be at increased risk for infections and bleeding.  nausea and vomiting  pain, swelling, redness or irritation at the injection site  pain, tingling, numbness in the hands or feet  problems with balance, talking, walking  trouble passing urine or change in the amount of urine Side effects that usually do not require medical attention (report to your doctor or health care professional if they continue or are bothersome):  hair loss  loss of appetite  metallic taste in the mouth or changes in taste This list may not describe all possible side effects. Call your doctor for medical advice about side effects. You may report side effects to FDA at 1-800-FDA-1088. Where should I keep my medicine? This drug is given in a hospital or clinic and will not be stored at home. NOTE: This sheet is a summary. It may not cover all possible information. If you have questions about this medicine, talk to your doctor, pharmacist, or health care provider.  2020 [employer]vier/Gold Standard (2007-07-18 14:38:05)

## 2020-02-04 NOTE — Progress Notes (Signed)
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   All questions were answered. The patient knows to call the clinic with any problems, questions or concerns. The total time spent in the appointment was 20 minutes encounter with patients including review of chart and various tests results, discussions about plan of care and coordination of care plan   [name], MD 02/04/2020 1:43 PM  INTERVAL HISTORY: Please see below for problem oriented charting. She returns with her husband for further follow-up and to start cycle 1 of treatment She continues to have mild intermittent abdominal pain/discomfort No nausea or constipation She has completed recent echocardiogram She is requesting some pain medicine to take as needed  SUMMARY OF ONCOLOGIC HISTORY: Oncology History Overview Note  High grade serous, right fallopian tube  HRD, BRCA tumor testing were neg   Ovarian cancer ([place])  03/21/2018 Initial Diagnosis   Ovarian cancer ([place])   02/04/2020 -  Chemotherapy   The patient had palonosetron (ALOXI)  injection 0.25 mg, 0.25 mg, Intravenous,  Once, 1 of 6 cycles CARBOplatin (PARAPLATIN) 390 mg in sodium chloride 0.9 % 100 mL chemo infusion, 390 mg (100 % of original dose 389.5 mg), Intravenous,  Once, 1 of 6 cycles Dose modification:   (original dose 389.5 mg, Cycle 1) DOXOrubicin HCL LIPOSOMAL (DOXIL) 54 mg in dextrose 5 % 250 mL chemo infusion, 30 mg/m2 = 54 mg, Intravenous,  Once, 1 of 6 cycles fosaprepitant (EMEND) 150 mg in sodium chloride 0.9 % 145 mL IVPB, 150 mg, Intravenous,  Once, 1 of 6 cycles  for chemotherapy treatment.    Fallopian tube cancer, carcinoma, right ([place])  10/24/2017 Initial Diagnosis   She has presentation of vague abdominal pain   02/20/2018 Imaging   Outside CT abdomen and pelvis: This revealed an incidental finding of a 1.5 cm subcapsular lesion in the lateral upper pole of the left kidney which measured higher than fluid attenuation which could represent a proteinaceous renal cyst or solid renal mass.  There was no ascites.  There is no lymphadenopathy.  There were no masses in the pelvis including ovarian or adnexal masses seen.  There was however soft tissue stranding seen with nodularity in the omental fat in the lower abdomen without evidence of ascites.  This was felt to represent either carcinomatosis or inflammatory infectious etiologies.  There was colonic diverticulosis seen without radiographic evidence of diverticulitis.  The radiologist recommended MRI of the abdomen to further work-up the renal mass   02/22/2018 Tumor Marker   Patient's tumor was tested for the following markers: CA-125. Results of the tumor marker test revealed 103.   03/06/2018 Pathology Results   Omentum, biopsy - ADENOCARCINOMA WITH  PSAMMOMA BODIES, SEE COMMENT. Microscopic Comment There are scattered small foci of malignant glands with associated psammoma bodies. While the tumor is somewhat limited the cells have a more low grade appearance. There is prominent lymphovascular  invasion. Immunohistochemistry reveals the cells are positive for cytokeratin 7, ER, MOC31, PAX8, and WT-1. They are negative for calretinin, cytokeratin 20, CDX-2, and TTF-1. Overall, the findings are consistent with adenocarcinoma. The differential includes a primary gynecologic or peritoneal tumor, although the morphology is not typical of a high grade serous carcinoma.   03/06/2018 Surgery   Surgeon: Donaciano Eva   Pre-operative Diagnosis: omental mass, elevated CA 125 Operation: Laparoscopic omentectomy  Surgeon: Donaciano Eva  Operative Findings:  : normal diaphragm and upper omentum. Sigmoid colon densely adherent to left pelvic side wall consistent with history of diverticulitis. Omentum adherent to anterior abdominal wall and sigmoid colon. Surgically absent uterus. Right ovary very small and grossly normal. Left ovary obscured by adhesed sigmoid colon. No ascites. No carcinomatosis. There was a nodular firm distal omentum on the left which was adherent to the sigmoid colon and most consistent with inflammatory change.      03/21/2018 Pathology Results   1. Soft tissue mass, simple excision, midline abdominal wall mass - METASTATIC SEROUS CARCINOMA. 2. Omentum, resection for tumor - METASTATIC SEROUS CARCINOMA. 3. Adnexa - ovary +/- tube, neoplastic, right - RIGHT FALLOPIAN TUBE: -HIGH GRADE SEROUS CARCINOMA. -SEE ONCOLOGY TABLE. - RIGHT OVARY: SURFACE PSAMMOMA BODIES. NO DEFINITIVE MALIGNANT CELLS. -INCLUSION CYSTS. 4. Adnexa - ovary +/- tube, neoplastic, left - LEFT FALLOPIAN TUBE: LUMINAL AND SURFACE SEROUS CARCINOMA. - LEFT OVARY: FOCAL PSAMMOMA BODIES AND MALIGNANT CELLS. 5. Soft tissue mass, simple excision, left abdominal wall - METASTATIC SEROUS CARCINOMA. Microscopic Comment 3. OVARY or FALLOPIAN TUBE or PRIMARY PERITONEUM: Procedure: Bilateral salpingo-oophorectomy. Abdominal wall mass excision and omentectomy. Specimen Integrity:  Intact. Tumor Site: Right fallopian tube. Ovarian Surface Involvement (required only if applicable): Present (left). Fallopian Tube Surface Involvement (required only if applicable): Present. Tumor Size: 1.5 cm. Histologic Type: High grade serous carcinoma. Histologic Grade: High grade. Implants (required for advanced stage serous/seromucinous borderline tumors only): Present. Other Tissue/ Organ Involvement: Omentum, abdominal wall, left fallopian tube and ovary. Largest Extrapelvic Peritoneal Focus (required only if applicable): >2 cm. Peritoneal/Ascitic Fluid: N/A. Treatment Effect (required only for high-grade serous carcinomas): N/A. Regional Lymph Nodes: No lymph nodes submitted or found Pathologic Stage Classification (pTNM, AJCC 8th Edition): pT3c, pNX Representative Tumor Block: 3A Comment(s): None.   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   Surgeon: Thereasa Solo, MD. Specimens: Bilateral tubes / ovaries, omentum. Midline abdominal wall mass, left lateral abdominal wall mass.    Operative Findings:  Abdominal wall masses consistent with port site metastases at the umbilical incision (7cm) and left lateral incision (5cm). Omental caking in the infracolic omentum. Grossly normal very small tubes and ovaries. Normal diaphragms. No lymphadenopathy. Normal small and large intestine with the exception of miliary studding of tumor on the surface of the sigmoid colon and rectum in the cul de sac.    This represented an optimal cytoreduction (R1) with no gross visible disease remaining, however there was a thin rind of tumor on the lateral left fascia associated with the port site metastasis.     04/11/2018 Cancer Staging   Staging form: Ovary, Fallopian Tube,  and  Primary Peritoneal Carcinoma, AJCC 8th Edition - Pathologic: FIGO Stage IIIC (pT3, pN0, cM0) - Signed by [name], MD on 04/11/2018   05/23/2018 Imaging   1. Interval resolution of the anterior midline abdominal wall seroma. There is some persistent wispy soft tissue attenuation in the region of the midline incision, nonspecific and may simply reflect granulation tissue from surgery. 2. Stable 13 mm exophytic lesion posterior left kidney with attenuation higher than expected for a simple cyst. This may be a cyst complicated by proteinaceous debris or hemorrhage, but attention on follow-up recommended. 3. Stable mild persistent distention of proximal jejunal loops with gradual tapering to nondilated distal small bowel. 4. No overt peritoneal or mesenteric disease identified on this exam.   05/23/2018 Tumor Marker   Patient's tumor was tested for the following markers: CA-125 Results of the tumor marker test revealed 11.6   06/02/2018 - 09/20/2018 Chemotherapy   The patient had carboplatin and taxol   06/19/2018 Procedure   Status post right IJ port catheter placement. Catheter ready for use.   06/26/2018 Tumor Marker   Patient's tumor was tested for the following markers: CA-125. Results of the tumor marker test revealed 10.7   09/20/2018 Tumor Marker   Patient's tumor was tested for the following markers: CA-125. Results of the tumor marker test revealed 9.1   10/25/2018 Imaging   1. There is suspicious, abnormal asymmetric increased soft tissue involving the cecum and ascending colon. This is suspicious for residual/progressive serosal involvement by tumor. 2. No additional sites of disease identified. No evidence for nodal metastasis, solid organ metastasis or ascites.     11/07/2018 Procedure   She had negative colonoscopy evaluation   12/05/2018 Tumor Marker   Patient's tumor was tested for the following markers: CA-125 Results of the tumor marker test revealed 8.6   01/25/2019  Imaging   Ct abdomen and pelvis Signs of omentectomy and hysterectomy without definitive signs of residual recurrent disease. Small nodular focus bridging rectum and vagina is stable dating back January to 12/14/2018, potentially postoperative but suggest attention on subsequent imaging.   01/25/2019 Tumor Marker   Patient's tumor was tested for the following markers: CA-125 Results of the tumor marker test revealed 10.3   03/15/2019 Tumor Marker   Patient's tumor was tested for the following markers: CA-125 Results of the tumor marker test revealed 9.6   03/23/2019 Imaging   No acute findings in the abdomen or pelvis.   Colonic diverticulosis.  No active diverticulitis.   Prior open tectum E and hysterectomy.   Aortic atherosclerosis.     05/10/2019 Tumor Marker   Patient's tumor was tested for the following markers: CA-125 Results of the tumor marker test revealed 11.8   05/22/2019 Imaging   1. Stable exam. No evidence of recurrent or metastatic carcinoma within the abdomen or pelvis. 2. Colonic diverticulosis, without radiographic evidence of diverticulitis.     11/08/2019 Imaging   1. Status post hysterectomy and bilateral oophorectomy. 2. Soft tissue thickening within the deep pelvis which when compared to multiple prior exams is felt to be slowly progressive. Especially given the elevated CA 125 level, this is suspicious for peritoneal recurrence. Potential imaging strategies include PET (which may be of low sensitivity secondary to the lack of well-defined dominant mass) or pelvic CT follow-up at 3 months. 3. Otherwise, no evidence of metastatic disease within the chest, abdomen, or pelvis. 4. Coronary artery atherosclerosis. Aortic Atherosclerosis (ICD10-I70.0). 5. Ventral abdominal wall hernia containing nonobstructive transverse colon, as before. 6.  Possible constipation.   12/21/2019 Tumor Marker   Patient's tumor was tested for the following markers: 95.2. Results of  the tumor marker test revealed CA-`125.   01/17/2020 Imaging   1. Similar appearance of nodularity in the central pelvis along the vaginal cuff. There is a particular nodular soft tissue density measuring 2.2 x 1.6 cm along the wall of the proximal rectum that is slightly more conspicuous than previously, raising concern for possible minimal progression. Similar appearance of the other areas of nodular soft tissue in the central pelvis. PET-CT may prove helpful to further evaluate. 2. No ascites. 3. Left colonic diverticulosis without diverticulitis. 4. 1.7 cm exophytic lesion upper pole left kidney with well-defined homogeneous attenuation slightly higher than would be expected for simple fluid. This is probably a complex cyst. Attention on follow-up recommended. 5. Aortic Atherosclerosis (ICD10-I70.0).   01/17/2020 Tumor Marker   Patient's tumor was tested for the following markers: CA-125 Results of the tumor marker test revealed 135   01/30/2020 Echocardiogram   1. Left ventricular ejection fraction, by estimation, is 55 to 60%. Left ventricular ejection fraction by PLAX is 55 %. The left ventricle has normal function. The left ventricle has no regional wall motion abnormalities. Left ventricular diastolic parameters are indeterminate.  2. Right ventricular systolic function is normal. The right ventricular size is normal.  3. Left atrial size was borderline dilated.  4. The mitral valve is grossly normal. Trivial mitral valve regurgitation.  5. The aortic valve was not well visualized. Aortic valve regurgitation is trivial.  6. The inferior vena cava is normal in size with greater than 50% respiratory variability, suggesting right atrial pressure of 3 mmHg.   02/04/2020 -  Chemotherapy   The patient had palonosetron (ALOXI) injection 0.25 mg, 0.25 mg, Intravenous,  Once, 1 of 6 cycles CARBOplatin (PARAPLATIN) 390 mg in sodium chloride 0.9 % 100 mL chemo infusion, 390 mg (100 % of original  dose 389.5 mg), Intravenous,  Once, 1 of 6 cycles Dose modification:   (original dose 389.5 mg, Cycle 1) DOXOrubicin HCL LIPOSOMAL (DOXIL) 54 mg in dextrose 5 % 250 mL chemo infusion, 30 mg/m2 = 54 mg, Intravenous,  Once, 1 of 6 cycles fosaprepitant (EMEND) 150 mg in sodium chloride 0.9 % 145 mL IVPB, 150 mg, Intravenous,  Once, 1 of 6 cycles  for chemotherapy treatment.      REVIEW OF SYSTEMS:   Constitutional: Denies fevers, chills or abnormal weight loss Eyes: Denies blurriness of vision Ears, nose, mouth, throat, and face: Denies mucositis or sore throat Respiratory: Denies cough, dyspnea or wheezes Cardiovascular: Denies palpitation, chest discomfort or lower extremity swelling Gastrointestinal:  Denies nausea, heartburn or change in bowel habits Skin: Denies abnormal skin rashes Lymphatics: Denies new lymphadenopathy or easy bruising Neurological:Denies numbness, tingling or new weaknesses Behavioral/Psych: Mood is stable, no new changes  All other systems were reviewed with the patient and are negative.  I have reviewed the past medical history, past surgical history, social history and family history with the patient and they are unchanged from previous note.  ALLERGIES:  is allergic to ivp dye [iodinated diagnostic agents], propoxyphene, codeine, and ultram [tramadol hcl].  MEDICATIONS:  Current Outpatient Medications  Medication Sig Dispense Refill  . Biotin 1 MG CAPS Take 1 tablet by mouth daily.    . carboxymethylcellulose (REFRESH PLUS) 0.5 % SOLN Place 1 drop into both eyes 3 (three) times daily as needed (for dry eyes.).    Marland Kitchen Cholecalciferol (VITAMIN D3 SUPER STRENGTH) 50  MCG (2000 UT) TABS Take 2,000 Units by mouth daily.    . Elastic Bandages & Supports (ABDOMINAL BINDER/ELASTIC MED) MISC 1 Device by Does not apply route daily as needed. 1 each 1  . ibuprofen (ADVIL) 400 MG tablet Take 400 mg by mouth 2 (two) times daily.    Marland Kitchen lubiprostone (AMITIZA) 8 MCG capsule  Take 1 capsule (8 mcg total) by mouth 2 (two) times daily with a meal. Pharmacy- d/c rx for linzess. Not covered well by insurance 60 capsule 1  . ondansetron (ZOFRAN) 8 MG tablet Take 1 tablet (8 mg total) by mouth 2 (two) times daily as needed. Start on the third day after chemotherapy. 30 tablet 1  . oxyCODONE (OXY IR/ROXICODONE) 5 MG immediate release tablet Take 1 tablet (5 mg total) by mouth every 4 (four) hours as needed for severe pain. 30 tablet 0  . polyethylene glycol (MIRALAX / GLYCOLAX) 17 g packet Take 17 g by mouth daily as needed. (Patient not taking: Reported on 10/25/2019)    . prochlorperazine (COMPAZINE) 10 MG tablet Take 1 tablet (10 mg total) by mouth every 6 (six) hours as needed (Nausea or vomiting). 30 tablet 1  . triamcinolone cream (KENALOG) 0.5 % Apply 1 application topically 2 (two) times daily. Apply to eyelids/eyebrows  4   No current facility-administered medications for this visit.   Facility-Administered Medications Ordered in Other Visits  Medication Dose Route Frequency Provider Last Rate Last Admin  . CARBOplatin (PARAPLATIN) 390 mg in sodium chloride 0.9 % 250 mL chemo infusion  390 mg Intravenous Once Alvy Bimler, Kemisha Bonnette, MD      . DOXOrubicin HCL LIPOSOMAL (DOXIL) 50 mg in dextrose 5 % 250 mL chemo infusion  28 mg/m2 (Treatment Plan Recorded) Intravenous Once Alvy Bimler, Lucious Zou, MD      . fosaprepitant (EMEND) 150 mg in sodium chloride 0.9 % 145 mL IVPB  150 mg Intravenous Once Alvy Bimler, Jacklyn Branan, MD 450 mL/hr at 02/04/20 1341 150 mg at 02/04/20 1341  . heparin lock flush 100 unit/mL  500 Units Intracatheter Once PRN Alvy Bimler, Delontae Lamm, MD      . sodium chloride flush (NS) 0.9 % injection 10 mL  10 mL Intracatheter PRN Alvy Bimler, Mayes Sangiovanni, MD        PHYSICAL EXAMINATION: ECOG PERFORMANCE STATUS: 1 - Symptomatic but completely ambulatory  Vitals:   02/04/20 1219  BP: (!) 157/86  Pulse: 75  Resp: 18  Temp: 99.4 F (37.4 C)  SpO2: 96%   Filed Weights   02/04/20 1219  Weight: 154 lb 6.4  oz (70 kg)    GENERAL:alert, no distress and comfortable NEURO: alert & oriented x 3 with fluent speech, no focal motor/sensory deficits  LABORATORY DATA:  I have reviewed the data as listed    Component Value Date/Time   NA 140 01/30/2020 1134   K 3.7 01/30/2020 1134   CL 107 01/30/2020 1134   CO2 29 01/30/2020 1134   GLUCOSE 150 (H) 01/30/2020 1134   BUN 12 01/30/2020 1134   CREATININE 0.74 01/30/2020 1134   CALCIUM 9.5 01/30/2020 1134   PROT 7.0 01/30/2020 1134   ALBUMIN 3.7 01/30/2020 1134   AST 16 01/30/2020 1134   ALT 16 01/30/2020 1134   ALKPHOS 64 01/30/2020 1134   BILITOT 0.5 01/30/2020 1134   GFRNONAA >60 01/30/2020 1134   GFRAA >60 01/17/2020 1339    No results found for: SPEP, UPEP  Lab Results  Component Value Date   WBC 10.1 01/30/2020   NEUTROABS 6.2 01/30/2020  HGB 13.5 01/30/2020   HCT 42.1 01/30/2020   MCV 86.6 01/30/2020   PLT 197 01/30/2020      Chemistry      Component Value Date/Time   NA 140 01/30/2020 1134   K 3.7 01/30/2020 1134   CL 107 01/30/2020 1134   CO2 29 01/30/2020 1134   BUN 12 01/30/2020 1134   CREATININE 0.74 01/30/2020 1134      Component Value Date/Time   CALCIUM 9.5 01/30/2020 1134   ALKPHOS 64 01/30/2020 1134   AST 16 01/30/2020 1134   ALT 16 01/30/2020 1134   BILITOT 0.5 01/30/2020 1134       RADIOGRAPHIC STUDIES: I have personally reviewed the radiological images as listed and agreed with the findings in the report. CT ABDOMEN PELVIS W CONTRAST  Result Date: 01/18/2020 CLINICAL DATA:  Ovarian cancer.  Restaging. EXAM: CT ABDOMEN AND PELVIS WITH CONTRAST TECHNIQUE: Multidetector CT imaging of the abdomen and pelvis was performed using the standard protocol following bolus administration of intravenous contrast. CONTRAST:  126m OMNIPAQUE IOHEXOL 300 MG/ML  SOLN COMPARISON:  11/08/2019 FINDINGS: Lower chest: Unremarkable. Hepatobiliary: No suspicious focal abnormality within the liver parenchyma. There is no  evidence for gallstones, gallbladder wall thickening, or pericholecystic fluid. No intrahepatic or extrahepatic biliary dilation. Pancreas: No focal mass lesion. No dilatation of the main duct. No intraparenchymal cyst. No peripancreatic edema. Spleen: No splenomegaly. No focal mass lesion. Adrenals/Urinary Tract: No adrenal nodule or mass. Right kidney unremarkable. 1.7 cm exophytic lesion upper pole left kidney is similar to prior, wall well-defined homogeneous attenuation slightly higher than would be expected for simple fluid. This is probably a complex cyst. A several additional very tiny hypoattenuating cortical lesions in the left kidney are too small to characterize but likely benign. Insert normal ureter The urinary bladder appears normal for the degree of distention. Stomach/Bowel: Stomach is unremarkable. No gastric wall thickening. No evidence of outlet obstruction. Duodenum is normally positioned as is the ligament of Treitz. No small bowel wall thickening. No small bowel dilatation. The terminal ileum is normal. The appendix is not visualized, but there is no edema or inflammation in the region of the cecum. No gross colonic mass. No colonic wall thickening. Diverticular changes are noted in the left colon without evidence of diverticulitis. Vascular/Lymphatic: There is abdominal aortic atherosclerosis without aneurysm. There is no gastrohepatic or hepatoduodenal ligament lymphadenopathy. No retroperitoneal or mesenteric lymphadenopathy. No pelvic sidewall lymphadenopathy. Reproductive: The uterus is surgically absent. There is no adnexal mass. Other: Nodularity in the central pelvis along the vaginal cuff is similar to prior. There is a particular nodular soft tissue density measuring 2.2 x 1.6 cm along the wall of the proximal rectum that is slightly more conspicuous than previously (see image 72/2). Focal soft tissue attenuation posterior to the cecum measuring 2.6 x 2.3 cm on 70/2 is similar to  prior. Similar 17 mm soft tissue nodule along the distal sigmoid colon (70/2). Musculoskeletal: No worrisome lytic or sclerotic osseous abnormality. IMPRESSION: 1. Similar appearance of nodularity in the central pelvis along the vaginal cuff. There is a particular nodular soft tissue density measuring 2.2 x 1.6 cm along the wall of the proximal rectum that is slightly more conspicuous than previously, raising concern for possible minimal progression. Similar appearance of the other areas of nodular soft tissue in the central pelvis. PET-CT may prove helpful to further evaluate. 2. No ascites. 3. Left colonic diverticulosis without diverticulitis. 4. 1.7 cm exophytic lesion upper pole left kidney with well-defined  homogeneous attenuation slightly higher than would be expected for simple fluid. This is probably a complex cyst. Attention on follow-up recommended. 5. Aortic Atherosclerosis (ICD10-I70.0). Electronically Signed   By: Misty Stanley M.D.   On: 01/18/2020 07:19   ECHOCARDIOGRAM COMPLETE  Result Date: 01/30/2020    ECHOCARDIOGRAM REPORT   Patient Name:   MARITES NATH [facility] Date of Exam: 01/30/2020 Medical Rec #:  [id number]             Height:       63.0 in Accession #:    [id number]            Weight:       156.8 lb Date of Birth:  [birth year]             BSA:          1.744 m Patient Age:    79 years              BP:           163/92 mmHg Patient Gender: F                     HR:           63 bpm. Exam Location:  Outpatient Procedure: 2D Echo, Cardiac Doppler, Color Doppler and Strain Analysis Indications:    Z51.11 Encounter for antineoplastic chemotheraphy  History:        Patient has no prior history of Echocardiogram examinations.                 Risk Factors:Hypertension. Cancer.  Sonographer:    Roseanna Rainbow Referring Phys: [id number] Loye Reininger [facility]  Sonographer Comments: Suboptimal apical window. Global longitudinal strain was attempted. Patient had abnormal EKG during apicals. Heart model was attempted,  but not accurate. IMPRESSIONS  1. Left ventricular ejection fraction, by estimation, is 55 to 60%. Left ventricular ejection fraction by PLAX is 55 %. The left ventricle has normal function. The left ventricle has no regional wall motion abnormalities. Left ventricular diastolic parameters are indeterminate.  2. Right ventricular systolic function is normal. The right ventricular size is normal.  3. Left atrial size was borderline dilated.  4. The mitral valve is grossly normal. Trivial mitral valve regurgitation.  5. The aortic valve was not well visualized. Aortic valve regurgitation is trivial.  6. The inferior vena cava is normal in size with greater than 50% respiratory variability, suggesting right atrial pressure of 3 mmHg. Comparison(s): No prior Echocardiogram. Conclusion(s)/Recommendation(s): Normal biventricular function without evidence of hemodynamically significant valvular heart disease. Frequent PVCs during exam limited strain analysis. FINDINGS  Left Ventricle: Left ventricular ejection fraction, by estimation, is 55 to 60%. Left ventricular ejection fraction by PLAX is 55 %. The left ventricle has normal function. The left ventricle has no regional wall motion abnormalities. Global longitudinal strain performed but not reported based on interpreter judgement due to suboptimal tracking. The left ventricular internal cavity size was normal in size. There is no left ventricular hypertrophy of the basal-septal segment. Left ventricular  diastolic parameters are indeterminate. Right Ventricle: The right ventricular size is normal. No increase in right ventricular wall thickness. Right ventricular systolic function is normal. Left Atrium: Artifact in left atrial in subcostal most consistent with mirror from tricuspid annular plane. Left atrial size was borderline dilated. Right Atrium: Right atrial size was normal in size. Pericardium: There is no evidence of pericardial effusion. Mitral Valve: The  mitral valve is grossly normal. There is mild  thickening of the mitral valve leaflet(s). Trivial mitral valve regurgitation. Tricuspid Valve: The tricuspid valve is grossly normal. Tricuspid valve regurgitation is not demonstrated. Aortic Valve: The aortic valve was not well visualized. Aortic valve regurgitation is trivial. Pulmonic Valve: The pulmonic valve was not well visualized. Pulmonic valve regurgitation is not visualized. Aorta: The aortic root and ascending aorta are structurally normal, with no evidence of dilitation. Venous: The inferior vena cava is normal in size with greater than 50% respiratory variability, suggesting right atrial pressure of 3 mmHg. IAS/Shunts: No atrial level shunt detected by color flow Doppler.  LEFT VENTRICLE PLAX 2D LV EF:         Left ventricular ejection fraction by PLAX is 55 %. LVIDd:         4.05 cm LVIDs:         2.90 cm LV PW:         0.95 cm LV IVS:        1.25 cm LVOT diam:     1.60 cm LV SV:         49 LV SV Index:   28 LVOT Area:     2.01 cm  LV Volumes (MOD) LV vol d, MOD A4C: 79.6 ml LV vol s, MOD A4C: 32.7 ml LV SV MOD A4C:     79.6 ml RIGHT VENTRICLE             IVC RV S prime:     13.30 cm/s  IVC diam: 2.00 cm TAPSE (M-mode): 1.9 cm LEFT ATRIUM             Index       RIGHT ATRIUM           Index LA diam:        3.70 cm 2.12 cm/m  RA Area:     10.70 cm LA Vol (A2C):   41.3 ml 23.69 ml/m RA Volume:   23.30 ml  13.36 ml/m LA Vol (A4C):   47.7 ml 27.36 ml/m LA Biplane Vol: 44.9 ml 25.75 ml/m  AORTIC VALVE LVOT Vmax:   133.00 cm/s LVOT Vmean:  88.500 cm/s LVOT VTI:    0.242 m  AORTA Ao Root diam: 2.90 cm Ao Asc diam:  3.10 cm MITRAL VALVE MV Area (PHT): 4.64 cm     SHUNTS MV Decel Time: 164 msec     Systemic VTI:  0.24 m MV E velocity: 111.50 cm/s  Systemic Diam: 1.60 cm Rudean Haskell MD Electronically signed by Rudean Haskell MD Signature Date/Time: 01/30/2020/2:09:10 PM    Final

## 2020-02-04 NOTE — Assessment & Plan Note (Signed)
She has mild intermittent abdominal pain, secondary to peritoneal disease I recommend low-dose pain medicine as needed I warned her about risk of nausea, sedation and constipation We discussed narcotic refill policy

## 2020-02-04 NOTE — Assessment & Plan Note (Signed)
We discussed the risk, benefits, side effects of combination of carboplatin with liposomal doxorubicin Her recent echocardiogram and blood work were unremarkable I plan to see her back prior to second cycle of treatment for toxicity I recommend minimum 3 cycles of treatment before repeat CT imaging

## 2020-02-04 NOTE — Assessment & Plan Note (Signed)
We discussed the role of palliative treatment

## 2020-02-05 MED FILL — ONDANSETRON HCL 8 MG TABLET: 8 | 10 days supply | Qty: 30 | Fill #0

## 2020-02-11 ENCOUNTER — Other Ambulatory Visit: Payer: Medicare HMO

## 2020-02-11 ENCOUNTER — Ambulatory Visit (HOSPITAL_COMMUNITY): Payer: Medicare HMO

## 2020-02-11 ENCOUNTER — Telehealth: Payer: Self-pay | Admitting: Oncology

## 2020-02-11 NOTE — Telephone Encounter (Signed)
She did the right thing by taking tums and pain medicine for her symptoms Continue miralax plus sennokot

## 2020-02-11 NOTE — Telephone Encounter (Signed)
Called Dezaree back with message below from Dr. Alvy Bimler.  She verbalized understanding and agreement.

## 2020-02-11 NOTE — Telephone Encounter (Signed)
Misty Hopkins called and wanted to let Dr. Alvy Bimler know that she had "bad indigestion" with pain in the right side of her chest on Saturday night.  It woke her up about 2 am and she took a Tums with some relief and then a pain pill which helped.  She is wondering if this is a side effect from chemo.  She also mentioned that she is taking Miralax BID and it is making her stools watery but she doesn't feel like she has a good bowel movement with them.  She took Senakot last night and did have a good bowel movement this morning.

## 2020-02-12 ENCOUNTER — Ambulatory Visit: Payer: Medicare HMO | Admitting: Hematology and Oncology

## 2020-02-19 ENCOUNTER — Telehealth: Payer: Self-pay | Admitting: Oncology

## 2020-02-19 NOTE — Telephone Encounter (Signed)
Probably unrelated She can take OTC tylenol or heat pad

## 2020-02-19 NOTE — Telephone Encounter (Signed)
Called Misty Hopkins and advised her of message below from Dr. Gorsuch. 

## 2020-02-19 NOTE — Telephone Encounter (Signed)
Misty Hopkins called and said she has noticed some soreness in her ribs when she coughs or sneezes.  She denies having a frequent cough and feels good otherwise. She is wondering if this is a side effect from chemo.  She said it feels like soreness after exercise.

## 2020-02-21 ENCOUNTER — Telehealth: Payer: Self-pay | Admitting: Hematology and Oncology

## 2020-02-21 ENCOUNTER — Other Ambulatory Visit: Payer: Medicare HMO

## 2020-02-21 ENCOUNTER — Telehealth: Payer: Self-pay | Admitting: Oncology

## 2020-02-21 ENCOUNTER — Telehealth: Payer: Self-pay | Admitting: *Deleted

## 2020-02-21 ENCOUNTER — Inpatient Hospital Stay (HOSPITAL_BASED_OUTPATIENT_CLINIC_OR_DEPARTMENT_OTHER): Payer: Medicare HMO | Admitting: Hematology and Oncology

## 2020-02-21 ENCOUNTER — Encounter: Payer: Medicare HMO | Admitting: Medical

## 2020-02-21 ENCOUNTER — Other Ambulatory Visit: Payer: Self-pay

## 2020-02-21 ENCOUNTER — Inpatient Hospital Stay: Payer: Medicare HMO

## 2020-02-21 ENCOUNTER — Encounter: Payer: Self-pay | Admitting: Hematology and Oncology

## 2020-02-21 VITALS — BP 149/81 | HR 86 | Temp 98.9°F | Resp 18 | Ht 63.0 in | Wt 154.0 lb

## 2020-02-21 DIAGNOSIS — G893 Neoplasm related pain (acute) (chronic): Secondary | ICD-10-CM

## 2020-02-21 DIAGNOSIS — Z5111 Encounter for antineoplastic chemotherapy: Secondary | ICD-10-CM | POA: Diagnosis not present

## 2020-02-21 DIAGNOSIS — C5701 Malignant neoplasm of right fallopian tube: Secondary | ICD-10-CM

## 2020-02-21 DIAGNOSIS — K5730 Diverticulosis of large intestine without perforation or abscess without bleeding: Secondary | ICD-10-CM

## 2020-02-21 DIAGNOSIS — K5909 Other constipation: Secondary | ICD-10-CM

## 2020-02-21 NOTE — Telephone Encounter (Signed)
Called Mili back with appointments for labs/flush and symptom management with Sandi Mealy, PA.  She verbalized understanding and agreement.

## 2020-02-21 NOTE — Assessment & Plan Note (Signed)
She is known to have diverticulosis She could have a flare related to diverticulitis but her symptoms is not consistent with that For now, I plan conservative approach and recommend she switch to a liquid diet over the next few days for bowel rest I also recommend increase fluid hydration as tolerated

## 2020-02-21 NOTE — Assessment & Plan Note (Signed)
She has acute on chronic pain I recommend increasing oxycodone and to add acetaminophen as needed I also recommend bowel rest I will call her next week to see how she is doing with her pain control I did warn her about risk of constipation

## 2020-02-21 NOTE — Patient Instructions (Signed)
1) Take oxycodone 1 to 2 tabs every 4 to 6 hours as needed for pain. You can take tylenol with each dose 2) continue laxatives daily 3) change diet to liquid diet 4) drink plenty of liquids: at least 75 to 80 ounces per day

## 2020-02-21 NOTE — Telephone Encounter (Signed)
Per 10/28 los, no changes made to pt schedule

## 2020-02-21 NOTE — Assessment & Plan Note (Signed)
She has recent changes in bowel habits She is known to have history of chronic constipation I recommend aggressive laxative therapy

## 2020-02-21 NOTE — Progress Notes (Signed)
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[facility]) She is having acute on chronic bilateral left and right quadrant pain It is not related to chemo, rather could be due to flare of diverticulosis or symptoms related to her disease I recommend conservative approach Her examination is benign We will manage this conservatively and I will call her early next week to see how she is doing  Cancer associated pain She has acute on chronic pain I recommend increasing oxycodone and to add acetaminophen as needed I also recommend bowel rest I will call her next week to see how she is doing with her pain control I did warn her about risk of constipation  Diverticulosis of colon She is known to have diverticulosis She could have a flare related to diverticulitis but her symptoms is not consistent with that For now, I plan conservative approach and recommend she switch to a liquid diet over the next few days for bowel rest I also recommend increase fluid hydration as tolerated  Other constipation She has recent changes in bowel habits She is known to have history of chronic constipation I recommend aggressive laxative therapy   No orders of the defined types were placed in this encounter.   All questions were answered. The patient knows to call the clinic with any problems, questions or concerns. The total time spent in the appointment was 30 minutes encounter with patients including review of chart and various tests results, discussions about plan of care and coordination of care plan   [name], MD 02/21/2020 1:24 PM  INTERVAL HISTORY: Please see below for problem oriented charting. She is seen urgently due to feeling unwell She started to have severe bilateral lower quadrant pain, left  greater than right She rated her pain is severe, 8 out of 10 She took oxycodone as needed and that seems to help She denies nausea or vomiting Her pain comes and goes and somewhat alleviated by passage of bowel movement She did notice some change in the caliber of her stool She has some mucositis with recent treatment but that has resolved  SUMMARY OF ONCOLOGIC HISTORY: Oncology History Overview Note  High grade serous, right fallopian tube  HRD, BRCA tumor testing were neg   Ovarian cancer ([place])  03/21/2018 Initial Diagnosis   Ovarian cancer ([place])   02/04/2020 -  Chemotherapy   The patient had palonosetron (ALOXI) injection 0.25 mg, 0.25 mg, Intravenous,  Once, 1 of 6 cycles Administration: 0.25 mg (02/04/2020) CARBOplatin (PARAPLATIN) 390 mg in sodium chloride 0.9 % 250 mL chemo infusion, 390 mg (100 % of original dose 389.5 mg), Intravenous,  Once, 1 of 6 cycles Dose modification:   (original dose 389.5 mg, Cycle 1) Administration: 390 mg (02/04/2020) DOXOrubicin HCL LIPOSOMAL (DOXIL) 50 mg in dextrose 5 % 250 mL chemo infusion, 28 mg/m2 = 54 mg, Intravenous,  Once, 1 of 6 cycles Administration: 50 mg (02/04/2020) fosaprepitant (EMEND) 150 mg in sodium chloride 0.9 % 145 mL IVPB, 150 mg, Intravenous,  Once, 1 of 6 cycles Administration: 150 mg (02/04/2020)  for chemotherapy treatment.    Fallopian tube cancer, carcinoma, right ([place])  10/24/2017 Initial Diagnosis   She has presentation of vague abdominal pain   02/20/2018 Imaging   Outside CT abdomen and pelvis: This revealed an incidental finding of a 1.5  cm subcapsular lesion in the lateral upper pole of the left kidney which measured higher than fluid attenuation which could represent a proteinaceous renal cyst or solid renal mass.  There was no ascites.  There is no lymphadenopathy.  There were no masses in the pelvis including ovarian or adnexal masses seen.  There was however soft tissue stranding seen with nodularity in the  omental fat in the lower abdomen without evidence of ascites.  This was felt to represent either carcinomatosis or inflammatory infectious etiologies.  There was colonic diverticulosis seen without radiographic evidence of diverticulitis.  The radiologist recommended MRI of the abdomen to further work-up the renal mass   02/22/2018 Tumor Marker   Patient's tumor was tested for the following markers: CA-125. Results of the tumor marker test revealed 103.   03/06/2018 Pathology Results   Omentum, biopsy - ADENOCARCINOMA WITH PSAMMOMA BODIES, SEE COMMENT. Microscopic Comment There are scattered small foci of malignant glands with associated psammoma bodies. While the tumor is somewhat limited the cells have a more low grade appearance. There is prominent lymphovascular invasion. Immunohistochemistry reveals the cells are positive for cytokeratin 7, ER, MOC31, PAX8, and WT-1. They are negative for calretinin, cytokeratin 20, CDX-2, and TTF-1. Overall, the findings are consistent with adenocarcinoma. The differential includes a primary gynecologic or peritoneal tumor, although the morphology is not typical of a high grade serous carcinoma.   03/06/2018 Surgery   Surgeon: Donaciano Eva   Pre-operative Diagnosis: omental mass, elevated CA 125 Operation: Laparoscopic omentectomy  Surgeon: Donaciano Eva  Operative Findings:  : normal diaphragm and upper omentum. Sigmoid colon densely adherent to left pelvic side wall consistent with history of diverticulitis. Omentum adherent to anterior abdominal wall and sigmoid colon. Surgically absent uterus. Right ovary very small and grossly normal. Left ovary obscured by adhesed sigmoid colon. No ascites. No carcinomatosis. There was a nodular firm distal omentum on the left which was adherent to the sigmoid colon and most consistent with inflammatory change.      03/21/2018 Pathology Results   1. Soft tissue mass, simple excision,  midline abdominal wall mass - METASTATIC SEROUS CARCINOMA. 2. Omentum, resection for tumor - METASTATIC SEROUS CARCINOMA. 3. Adnexa - ovary +/- tube, neoplastic, right - RIGHT FALLOPIAN TUBE: -HIGH GRADE SEROUS CARCINOMA. -SEE ONCOLOGY TABLE. - RIGHT OVARY: SURFACE PSAMMOMA BODIES. NO DEFINITIVE MALIGNANT CELLS. -INCLUSION CYSTS. 4. Adnexa - ovary +/- tube, neoplastic, left - LEFT FALLOPIAN TUBE: LUMINAL AND SURFACE SEROUS CARCINOMA. - LEFT OVARY: FOCAL PSAMMOMA BODIES AND MALIGNANT CELLS. 5. Soft tissue mass, simple excision, left abdominal wall - METASTATIC SEROUS CARCINOMA. Microscopic Comment 3. OVARY or FALLOPIAN TUBE or PRIMARY PERITONEUM: Procedure: Bilateral salpingo-oophorectomy. Abdominal wall mass excision and omentectomy. Specimen Integrity: Intact. Tumor Site: Right fallopian tube. Ovarian Surface Involvement (required only if applicable): Present (left). Fallopian Tube Surface Involvement (required only if applicable): Present. Tumor Size: 1.5 cm. Histologic Type: High grade serous carcinoma. Histologic Grade: High grade. Implants (required for advanced stage serous/seromucinous borderline tumors only): Present. Other Tissue/ Organ Involvement: Omentum, abdominal wall, left fallopian tube and ovary. Largest Extrapelvic Peritoneal Focus (required only if applicable): >2 cm. Peritoneal/Ascitic Fluid: N/A. Treatment Effect (required only for high-grade serous carcinomas): N/A. Regional Lymph Nodes: No lymph nodes submitted or found Pathologic Stage Classification (pTNM, AJCC 8th Edition): pT3c, pNX Representative Tumor Block: 3A Comment(s): None.   03/21/2018 Surgery   Preoperative Diagnosis: stage IIIC primary peritoneal vs ovarian cancer   Procedure(s) Performed:  Exploratory laparotomy with bilateral salpingo-oophorectomy, omentectomy radical  tumor debulking for ovarian cancer, ventral hernia repair.   Surgeon: Thereasa Solo, MD. Specimens: Bilateral tubes /  ovaries, omentum. Midline abdominal wall mass, left lateral abdominal wall mass.    Operative Findings:  Abdominal wall masses consistent with port site metastases at the umbilical incision (7cm) and left lateral incision (5cm). Omental caking in the infracolic omentum. Grossly normal very small tubes and ovaries. Normal diaphragms. No lymphadenopathy. Normal small and large intestine with the exception of miliary studding of tumor on the surface of the sigmoid colon and rectum in the cul de sac.    This represented an optimal cytoreduction (R1) with no gross visible disease remaining, however there was a thin rind of tumor on the lateral left fascia associated with the port site metastasis.     04/11/2018 Cancer Staging   Staging form: Ovary, Fallopian Tube, and Primary Peritoneal Carcinoma, AJCC 8th Edition - Pathologic: FIGO Stage IIIC (pT3, pN0, cM0) - Signed by [name], MD on 04/11/2018   05/23/2018 Imaging   1. Interval resolution of the anterior midline abdominal wall seroma. There is some persistent wispy soft tissue attenuation in the region of the midline incision, nonspecific and may simply reflect granulation tissue from surgery. 2. Stable 13 mm exophytic lesion posterior left kidney with attenuation higher than expected for a simple cyst. This may be a cyst complicated by proteinaceous debris or hemorrhage, but attention on follow-up recommended. 3. Stable mild persistent distention of proximal jejunal loops with gradual tapering to nondilated distal small bowel. 4. No overt peritoneal or mesenteric disease identified on this exam.   05/23/2018 Tumor Marker   Patient's tumor was tested for the following markers: CA-125 Results of the tumor marker test revealed 11.6   06/02/2018 - 09/20/2018 Chemotherapy   The patient had carboplatin and taxol   06/19/2018 Procedure   Status post right IJ port catheter placement. Catheter ready for use.   06/26/2018 Tumor Marker   Patient's  tumor was tested for the following markers: CA-125. Results of the tumor marker test revealed 10.7   09/20/2018 Tumor Marker   Patient's tumor was tested for the following markers: CA-125. Results of the tumor marker test revealed 9.1   10/25/2018 Imaging   1. There is suspicious, abnormal asymmetric increased soft tissue involving the cecum and ascending colon. This is suspicious for residual/progressive serosal involvement by tumor. 2. No additional sites of disease identified. No evidence for nodal metastasis, solid organ metastasis or ascites.     11/07/2018 Procedure   She had negative colonoscopy evaluation   12/05/2018 Tumor Marker   Patient's tumor was tested for the following markers: CA-125 Results of the tumor marker test revealed 8.6   01/25/2019 Imaging   Ct abdomen and pelvis Signs of omentectomy and hysterectomy without definitive signs of residual recurrent disease. Small nodular focus bridging rectum and vagina is stable dating back January to 12/14/2018, potentially postoperative but suggest attention on subsequent imaging.   01/25/2019 Tumor Marker   Patient's tumor was tested for the following markers: CA-125 Results of the tumor marker test revealed 10.3   03/15/2019 Tumor Marker   Patient's tumor was tested for the following markers: CA-125 Results of the tumor marker test revealed 9.6   03/23/2019 Imaging   No acute findings in the abdomen or pelvis.   Colonic diverticulosis.  No active diverticulitis.   Prior open tectum E and hysterectomy.   Aortic atherosclerosis.     05/10/2019 Tumor Marker   Patient's tumor was tested for  the following markers: CA-125 Results of the tumor marker test revealed 11.8   05/22/2019 Imaging   1. Stable exam. No evidence of recurrent or metastatic carcinoma within the abdomen or pelvis. 2. Colonic diverticulosis, without radiographic evidence of diverticulitis.     11/08/2019 Imaging   1. Status post hysterectomy and  bilateral oophorectomy. 2. Soft tissue thickening within the deep pelvis which when compared to multiple prior exams is felt to be slowly progressive. Especially given the elevated CA 125 level, this is suspicious for peritoneal recurrence. Potential imaging strategies include PET (which may be of low sensitivity secondary to the lack of well-defined dominant mass) or pelvic CT follow-up at 3 months. 3. Otherwise, no evidence of metastatic disease within the chest, abdomen, or pelvis. 4. Coronary artery atherosclerosis. Aortic Atherosclerosis (ICD10-I70.0). 5. Ventral abdominal wall hernia containing nonobstructive transverse colon, as before. 6.  Possible constipation.   12/21/2019 Tumor Marker   Patient's tumor was tested for the following markers: 95.2. Results of the tumor marker test revealed CA-`125.   01/17/2020 Imaging   1. Similar appearance of nodularity in the central pelvis along the vaginal cuff. There is a particular nodular soft tissue density measuring 2.2 x 1.6 cm along the wall of the proximal rectum that is slightly more conspicuous than previously, raising concern for possible minimal progression. Similar appearance of the other areas of nodular soft tissue in the central pelvis. PET-CT may prove helpful to further evaluate. 2. No ascites. 3. Left colonic diverticulosis without diverticulitis. 4. 1.7 cm exophytic lesion upper pole left kidney with well-defined homogeneous attenuation slightly higher than would be expected for simple fluid. This is probably a complex cyst. Attention on follow-up recommended. 5. Aortic Atherosclerosis (ICD10-I70.0).   01/17/2020 Tumor Marker   Patient's tumor was tested for the following markers: CA-125 Results of the tumor marker test revealed 135   01/30/2020 Echocardiogram   1. Left ventricular ejection fraction, by estimation, is 55 to 60%. Left ventricular ejection fraction by PLAX is 55 %. The left ventricle has normal function. The left  ventricle has no regional wall motion abnormalities. Left ventricular diastolic parameters are indeterminate.  2. Right ventricular systolic function is normal. The right ventricular size is normal.  3. Left atrial size was borderline dilated.  4. The mitral valve is grossly normal. Trivial mitral valve regurgitation.  5. The aortic valve was not well visualized. Aortic valve regurgitation is trivial.  6. The inferior vena cava is normal in size with greater than 50% respiratory variability, suggesting right atrial pressure of 3 mmHg.   02/04/2020 -  Chemotherapy   The patient had palonosetron (ALOXI) injection 0.25 mg, 0.25 mg, Intravenous,  Once, 1 of 6 cycles Administration: 0.25 mg (02/04/2020) CARBOplatin (PARAPLATIN) 390 mg in sodium chloride 0.9 % 250 mL chemo infusion, 390 mg (100 % of original dose 389.5 mg), Intravenous,  Once, 1 of 6 cycles Dose modification:   (original dose 389.5 mg, Cycle 1) Administration: 390 mg (02/04/2020) DOXOrubicin HCL LIPOSOMAL (DOXIL) 50 mg in dextrose 5 % 250 mL chemo infusion, 28 mg/m2 = 54 mg, Intravenous,  Once, 1 of 6 cycles Administration: 50 mg (02/04/2020) fosaprepitant (EMEND) 150 mg in sodium chloride 0.9 % 145 mL IVPB, 150 mg, Intravenous,  Once, 1 of 6 cycles Administration: 150 mg (02/04/2020)  for chemotherapy treatment.      REVIEW OF SYSTEMS:   Constitutional: Denies fevers, chills or abnormal weight loss Eyes: Denies blurriness of vision Respiratory: Denies cough, dyspnea or wheezes Cardiovascular: Denies palpitation,  chest discomfort or lower extremity swelling Skin: Denies abnormal skin rashes Lymphatics: Denies new lymphadenopathy or easy bruising Neurological:Denies numbness, tingling or new weaknesses Behavioral/Psych: Mood is stable, no new changes  All other systems were reviewed with the patient and are negative.  I have reviewed the past medical history, past surgical history, social history and family history with the  patient and they are unchanged from previous note.  ALLERGIES:  is allergic to ivp dye [iodinated diagnostic agents], propoxyphene, codeine, and ultram [tramadol hcl].  MEDICATIONS:  Current Outpatient Medications  Medication Sig Dispense Refill  . Biotin 1 MG CAPS Take 1 tablet by mouth daily.    . carboxymethylcellulose (REFRESH PLUS) 0.5 % SOLN Place 1 drop into both eyes 3 (three) times daily as needed (for dry eyes.).    Marland Kitchen Cholecalciferol (VITAMIN D3 SUPER STRENGTH) 50 MCG (2000 UT) TABS Take 2,000 Units by mouth daily.    Marland Kitchen lubiprostone (AMITIZA) 8 MCG capsule Take 1 capsule (8 mcg total) by mouth 2 (two) times daily with a meal. Pharmacy- d/c rx for linzess. Not covered well by insurance 60 capsule 1  . ondansetron (ZOFRAN) 8 MG tablet TAKE 1 TABLET (8 MG TOTAL) BY MOUTH EVERY 8 (EIGHT) HOURS AS NEEDED FOR REFRACTORY NAUSEA / VOMITING. START ON DAY 3 AFTER CHEMO. 30 tablet 1  . oxyCODONE (OXY IR/ROXICODONE) 5 MG immediate release tablet Take 1 tablet (5 mg total) by mouth every 4 (four) hours as needed for severe pain. 30 tablet 0  . polyethylene glycol (MIRALAX / GLYCOLAX) 17 g packet Take 17 g by mouth daily as needed. (Patient not taking: Reported on 10/25/2019)    . prochlorperazine (COMPAZINE) 10 MG tablet Take 1 tablet (10 mg total) by mouth every 6 (six) hours as needed (Nausea or vomiting). 30 tablet 1  . triamcinolone cream (KENALOG) 0.5 % Apply 1 application topically 2 (two) times daily. Apply to eyelids/eyebrows  4   No current facility-administered medications for this visit.    PHYSICAL EXAMINATION: ECOG PERFORMANCE STATUS: 1 - Symptomatic but completely ambulatory  Vitals:   02/21/20 1210  BP: (!) 149/81  Pulse: 86  Resp: 18  Temp: 98.9 F (37.2 C)  SpO2: 99%   Filed Weights   02/21/20 1210  Weight: 154 lb (69.9 kg)    GENERAL:alert, no distress and comfortable SKIN: skin color, texture, turgor are normal, no rashes or significant lesions EYES: normal,  Conjunctiva are pink and non-injected, sclera clear OROPHARYNX:no exudate, no erythema and lips, buccal mucosa, and tongue normal  NECK: supple, thyroid normal size, non-tender, without nodularity LYMPH:  no palpable lymphadenopathy in the cervical, axillary or inguinal LUNGS: clear to auscultation and percussion with normal breathing effort HEART: regular rate & rhythm and no murmurs and no lower extremity edema ABDOMEN:abdomen soft, mild tenderness on deep palpation on the left lower quadrant without rebound or guarding.  Noted well-healed surgical scar.  No signs of ascites Musculoskeletal:no cyanosis of digits and no clubbing  NEURO: alert & oriented x 3 with fluent speech, no focal motor/sensory deficits  LABORATORY DATA:  I have reviewed the data as listed    Component Value Date/Time   NA 140 01/30/2020 1134   K 3.7 01/30/2020 1134   CL 107 01/30/2020 1134   CO2 29 01/30/2020 1134   GLUCOSE 150 (H) 01/30/2020 1134   BUN 12 01/30/2020 1134   CREATININE 0.74 01/30/2020 1134   CALCIUM 9.5 01/30/2020 1134   PROT 7.0 01/30/2020 1134   ALBUMIN 3.7 01/30/2020  1134   AST 16 01/30/2020 1134   ALT 16 01/30/2020 1134   ALKPHOS 64 01/30/2020 1134   BILITOT 0.5 01/30/2020 1134   GFRNONAA >60 01/30/2020 1134   GFRAA >60 01/17/2020 1339    No results found for: SPEP, UPEP  Lab Results  Component Value Date   WBC 10.1 01/30/2020   NEUTROABS 6.2 01/30/2020   HGB 13.5 01/30/2020   HCT 42.1 01/30/2020   MCV 86.6 01/30/2020   PLT 197 01/30/2020      Chemistry      Component Value Date/Time   NA 140 01/30/2020 1134   K 3.7 01/30/2020 1134   CL 107 01/30/2020 1134   CO2 29 01/30/2020 1134   BUN 12 01/30/2020 1134   CREATININE 0.74 01/30/2020 1134      Component Value Date/Time   CALCIUM 9.5 01/30/2020 1134   ALKPHOS 64 01/30/2020 1134   AST 16 01/30/2020 1134   ALT 16 01/30/2020 1134   BILITOT 0.5 01/30/2020 1134

## 2020-02-21 NOTE — Telephone Encounter (Signed)
Misty Hopkins called and said she started having throbbing pain in her left mid abdomen at 8:30 last night.  She took a pain pill and was able to go to sleep.  She woke up at 1:30 in the morning and said the pain was worse.  She took another pain pill at that time.  She said it is still throbbing/aching.  She doesn't feel anything in that area but it is sore to the touch. She also reports it is painful when she walks.    She continues to have the soreness in her ribs as well if she coughs or sneezes.  She denies feeling short of breath but is "stuffy" from allergies.  She reports having a good bowel movement yesterday and a small one this morning.   She is wondering if she can be seen today. Asked if she would rather go to the ER and she said no because she had a long wait last time she went.

## 2020-02-21 NOTE — Assessment & Plan Note (Signed)
She is having acute on chronic bilateral left and right quadrant pain It is not related to chemo, rather could be due to flare of diverticulosis or symptoms related to her disease I recommend conservative approach Her examination is benign We will manage this conservatively and I will call her early next week to see how she is doing

## 2020-02-21 NOTE — Telephone Encounter (Signed)
Called Misty Hopkins and advised her that Dr. Alvy Bimler has an opening today at 12:20 and that we will cancel the appointment for symptom management and labs.  She verbalized understanding and agreement.

## 2020-02-21 NOTE — Telephone Encounter (Signed)
I apologize I am fully booked today and not able to work her in I suggest symptom management clinic

## 2020-02-21 NOTE — Telephone Encounter (Signed)
Patient called and stated "I'm having severe pain on my left side of my abdomen. The pain started last night at about 8:30 pm. I took a pin pill and went to sleep. The got worse around 1:30 am, I took another pain pill. The pain level is at a 8-9. I just move without the pain getting worse; I don't think I can go all day with this." Explained that the message would be given to Dr Alvy Bimler and someone will call her back.

## 2020-02-25 ENCOUNTER — Telehealth: Payer: Self-pay

## 2020-02-25 ENCOUNTER — Telehealth: Payer: Self-pay | Admitting: Oncology

## 2020-02-25 NOTE — Telephone Encounter (Signed)
Misty Hopkins left a message that she is still having pain that comes and goes in her side.  She has been on the liquid diet since Thursday.  She is also taking pain pills that helps a little.

## 2020-02-25 NOTE — Telephone Encounter (Signed)
-----   Message from [name], MD sent at 02/25/2020  7:44 AM EDT ----- Regarding: pls call her Pls call and ask how is she doing? If better, ok to advance diet back to normal If not better, I can see her tomorrow around 12 pm

## 2020-02-25 NOTE — Telephone Encounter (Signed)
Called and spoke with husband. Misty Hopkins is not home and he did not see earlier message. Ask husband to have [place] call the office back. Gave below message to husband. He said she needs appt for tomorrow. Scheduled appt for 1200 tomorrow, instructed to arrive 20 minutes early. Husband verbalized understanding.

## 2020-02-25 NOTE — Telephone Encounter (Signed)
Misty Hopkins called and said she doesn't know what to do.  She is still having pain in her left side on and off.  She is also still having pain in her right side with coughing and sneezing.  She is worried about her chemo being delayed because of the pain and is wondering if it is from scar tissue from her surgery.  She is also worried about having to take the pain medicine in case she really needs it later.   Advised her of appointment with Dr. Alvy Bimler tomorrow at 12:00 to discuss next steps.

## 2020-02-25 NOTE — Telephone Encounter (Signed)
Called and left a message asking her to call the office back. 

## 2020-02-26 ENCOUNTER — Other Ambulatory Visit: Payer: Self-pay

## 2020-02-26 ENCOUNTER — Inpatient Hospital Stay: Payer: Medicare HMO | Attending: Hematology and Oncology | Admitting: Hematology and Oncology

## 2020-02-26 DIAGNOSIS — R030 Elevated blood-pressure reading, without diagnosis of hypertension: Secondary | ICD-10-CM | POA: Insufficient documentation

## 2020-02-26 DIAGNOSIS — Z79899 Other long term (current) drug therapy: Secondary | ICD-10-CM | POA: Insufficient documentation

## 2020-02-26 DIAGNOSIS — K5792 Diverticulitis of intestine, part unspecified, without perforation or abscess without bleeding: Secondary | ICD-10-CM | POA: Diagnosis not present

## 2020-02-26 DIAGNOSIS — C5701 Malignant neoplasm of right fallopian tube: Secondary | ICD-10-CM

## 2020-02-26 DIAGNOSIS — R1011 Right upper quadrant pain: Secondary | ICD-10-CM | POA: Insufficient documentation

## 2020-02-26 DIAGNOSIS — R0602 Shortness of breath: Secondary | ICD-10-CM | POA: Diagnosis not present

## 2020-02-26 DIAGNOSIS — R7989 Other specified abnormal findings of blood chemistry: Secondary | ICD-10-CM | POA: Insufficient documentation

## 2020-02-26 DIAGNOSIS — C561 Malignant neoplasm of right ovary: Secondary | ICD-10-CM | POA: Insufficient documentation

## 2020-02-26 DIAGNOSIS — K5730 Diverticulosis of large intestine without perforation or abscess without bleeding: Secondary | ICD-10-CM | POA: Diagnosis not present

## 2020-02-26 DIAGNOSIS — G893 Neoplasm related pain (acute) (chronic): Secondary | ICD-10-CM

## 2020-02-26 DIAGNOSIS — K5900 Constipation, unspecified: Secondary | ICD-10-CM | POA: Insufficient documentation

## 2020-02-26 DIAGNOSIS — Z5111 Encounter for antineoplastic chemotherapy: Secondary | ICD-10-CM | POA: Diagnosis not present

## 2020-02-26 DIAGNOSIS — R1032 Left lower quadrant pain: Secondary | ICD-10-CM | POA: Diagnosis not present

## 2020-02-26 DIAGNOSIS — Z885 Allergy status to narcotic agent status: Secondary | ICD-10-CM | POA: Diagnosis not present

## 2020-02-27 ENCOUNTER — Encounter: Payer: Self-pay | Admitting: Hematology and Oncology

## 2020-02-27 NOTE — Progress Notes (Signed)
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[facility]) She continues to have nonspecific right upper quadrant pain and left lower quadrant pain, slightly better compared to our previous visit It is not related to chemo, rather could be due to flare of diverticulosis or symptoms related to her disease I recommend conservative approach Her examination is benign We will manage this conservatively  I will see her and reexamine her again next week prior to cycle 2 of therapy  Diverticulosis of colon I suspect she might have flare of diverticulosis She is currently on modified diet and she felt slight improvement Continue conservative approach for now I do not believe she needs antibiotic therapy  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   All questions were answered. The patient knows to call the clinic with any problems, questions or concerns. The total time spent in the appointment was 20 minutes encounter with patients including review of chart and various tests results, discussions about plan of care and coordination of care plan   [name], MD 02/27/2020 8:16 AM  INTERVAL HISTORY: Please see below for problem oriented charting. She is seen again per patient request Since last time I saw her, she modified her diet Her pain might have slightly improved She had frequent bowel movement She is taking on average oxycodone along with acetaminophen approximately 4 times a day She continues to have right upper quadrant pain and left lower quadrant pain She is asking a lot of  questions whether her prior surgery might have because this problem but she is also puzzled that she did not have similar symptoms with her prior treatment  SUMMARY OF ONCOLOGIC HISTORY: Oncology History Overview Note  High grade serous, right fallopian tube  HRD, BRCA tumor testing were neg   Ovarian cancer ([place])  03/21/2018 Initial Diagnosis   Ovarian cancer ([place])   02/04/2020 -  Chemotherapy   The patient had palonosetron (ALOXI) injection 0.25 mg, 0.25 mg, Intravenous,  Once, 1 of 6 cycles Administration: 0.25 mg (02/04/2020) CARBOplatin (PARAPLATIN) 390 mg in sodium chloride 0.9 % 250 mL chemo infusion, 390 mg (100 % of original dose 389.5 mg), Intravenous,  Once, 1 of 6 cycles Dose modification:   (original dose 389.5 mg, Cycle 1) Administration: 390 mg (02/04/2020) DOXOrubicin HCL LIPOSOMAL (DOXIL) 50 mg in dextrose 5 % 250 mL chemo infusion, 28 mg/m2 = 54 mg, Intravenous,  Once, 1 of 6 cycles Administration: 50 mg (02/04/2020) fosaprepitant (EMEND) 150 mg in sodium chloride 0.9 % 145 mL IVPB, 150 mg, Intravenous,  Once, 1 of 6 cycles Administration: 150 mg (02/04/2020)  for chemotherapy treatment.    Fallopian tube cancer, carcinoma, right ([place])  10/24/2017 Initial Diagnosis   She has presentation of vague abdominal pain   02/20/2018 Imaging   Outside CT abdomen and pelvis: This revealed an incidental finding of a 1.5 cm subcapsular lesion in the lateral upper pole of the left kidney which measured higher than fluid attenuation which could represent a proteinaceous renal cyst or solid renal mass.  There was no ascites.  There is no lymphadenopathy.  There were  no masses in the pelvis including ovarian or adnexal masses seen.  There was however soft tissue stranding seen with nodularity in the omental fat in the lower abdomen without evidence of ascites.  This was felt to represent either carcinomatosis or inflammatory infectious etiologies.  There was colonic diverticulosis seen  without radiographic evidence of diverticulitis.  The radiologist recommended MRI of the abdomen to further work-up the renal mass   02/22/2018 Tumor Marker   Patient's tumor was tested for the following markers: CA-125. Results of the tumor marker test revealed 103.   03/06/2018 Pathology Results   Omentum, biopsy - ADENOCARCINOMA WITH PSAMMOMA BODIES, SEE COMMENT. Microscopic Comment There are scattered small foci of malignant glands with associated psammoma bodies. While the tumor is somewhat limited the cells have a more low grade appearance. There is prominent lymphovascular invasion. Immunohistochemistry reveals the cells are positive for cytokeratin 7, ER, MOC31, PAX8, and WT-1. They are negative for calretinin, cytokeratin 20, CDX-2, and TTF-1. Overall, the findings are consistent with adenocarcinoma. The differential includes a primary gynecologic or peritoneal tumor, although the morphology is not typical of a high grade serous carcinoma.   03/06/2018 Surgery   Surgeon: Donaciano Eva   Pre-operative Diagnosis: omental mass, elevated CA 125 Operation: Laparoscopic omentectomy  Surgeon: Donaciano Eva  Operative Findings:  : normal diaphragm and upper omentum. Sigmoid colon densely adherent to left pelvic side wall consistent with history of diverticulitis. Omentum adherent to anterior abdominal wall and sigmoid colon. Surgically absent uterus. Right ovary very small and grossly normal. Left ovary obscured by adhesed sigmoid colon. No ascites. No carcinomatosis. There was a nodular firm distal omentum on the left which was adherent to the sigmoid colon and most consistent with inflammatory change.      03/21/2018 Pathology Results   1. Soft tissue mass, simple excision, midline abdominal wall mass - METASTATIC SEROUS CARCINOMA. 2. Omentum, resection for tumor - METASTATIC SEROUS CARCINOMA. 3. Adnexa - ovary +/- tube, neoplastic, right - RIGHT FALLOPIAN  TUBE: -HIGH GRADE SEROUS CARCINOMA. -SEE ONCOLOGY TABLE. - RIGHT OVARY: SURFACE PSAMMOMA BODIES. NO DEFINITIVE MALIGNANT CELLS. -INCLUSION CYSTS. 4. Adnexa - ovary +/- tube, neoplastic, left - LEFT FALLOPIAN TUBE: LUMINAL AND SURFACE SEROUS CARCINOMA. - LEFT OVARY: FOCAL PSAMMOMA BODIES AND MALIGNANT CELLS. 5. Soft tissue mass, simple excision, left abdominal wall - METASTATIC SEROUS CARCINOMA. Microscopic Comment 3. OVARY or FALLOPIAN TUBE or PRIMARY PERITONEUM: Procedure: Bilateral salpingo-oophorectomy. Abdominal wall mass excision and omentectomy. Specimen Integrity: Intact. Tumor Site: Right fallopian tube. Ovarian Surface Involvement (required only if applicable): Present (left). Fallopian Tube Surface Involvement (required only if applicable): Present. Tumor Size: 1.5 cm. Histologic Type: High grade serous carcinoma. Histologic Grade: High grade. Implants (required for advanced stage serous/seromucinous borderline tumors only): Present. Other Tissue/ Organ Involvement: Omentum, abdominal wall, left fallopian tube and ovary. Largest Extrapelvic Peritoneal Focus (required only if applicable): >2 cm. Peritoneal/Ascitic Fluid: N/A. Treatment Effect (required only for high-grade serous carcinomas): N/A. Regional Lymph Nodes: No lymph nodes submitted or found Pathologic Stage Classification (pTNM, AJCC 8th Edition): pT3c, pNX Representative Tumor Block: 3A Comment(s): None.   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   Surgeon: Thereasa Solo, MD. Specimens: Bilateral tubes / ovaries, omentum. Midline abdominal wall mass, left lateral abdominal wall mass.    Operative Findings:  Abdominal wall masses consistent with port  site metastases at the umbilical  incision (7cm) and left lateral incision (5cm). Omental caking in the infracolic omentum. Grossly normal very small tubes and ovaries. Normal diaphragms. No lymphadenopathy. Normal small and large intestine with the exception of miliary studding of tumor on the surface of the sigmoid colon and rectum in the cul de sac.    This represented an optimal cytoreduction (R1) with no gross visible disease remaining, however there was a thin rind of tumor on the lateral left fascia associated with the port site metastasis.     04/11/2018 Cancer Staging   Staging form: Ovary, Fallopian Tube, and Primary Peritoneal Carcinoma, AJCC 8th Edition - Pathologic: FIGO Stage IIIC (pT3, pN0, cM0) - Signed by [name], MD on 04/11/2018   05/23/2018 Imaging   1. Interval resolution of the anterior midline abdominal wall seroma. There is some persistent wispy soft tissue attenuation in the region of the midline incision, nonspecific and may simply reflect granulation tissue from surgery. 2. Stable 13 mm exophytic lesion posterior left kidney with attenuation higher than expected for a simple cyst. This may be a cyst complicated by proteinaceous debris or hemorrhage, but attention on follow-up recommended. 3. Stable mild persistent distention of proximal jejunal loops with gradual tapering to nondilated distal small bowel. 4. No overt peritoneal or mesenteric disease identified on this exam.   05/23/2018 Tumor Marker   Patient's tumor was tested for the following markers: CA-125 Results of the tumor marker test revealed 11.6   06/02/2018 - 09/20/2018 Chemotherapy   The patient had carboplatin and taxol   06/19/2018 Procedure   Status post right IJ port catheter placement. Catheter ready for use.   06/26/2018 Tumor Marker   Patient's tumor was tested for the following markers: CA-125. Results of the tumor marker test revealed 10.7   09/20/2018 Tumor Marker   Patient's tumor was tested for the following markers:  CA-125. Results of the tumor marker test revealed 9.1   10/25/2018 Imaging   1. There is suspicious, abnormal asymmetric increased soft tissue involving the cecum and ascending colon. This is suspicious for residual/progressive serosal involvement by tumor. 2. No additional sites of disease identified. No evidence for nodal metastasis, solid organ metastasis or ascites.     11/07/2018 Procedure   She had negative colonoscopy evaluation   12/05/2018 Tumor Marker   Patient's tumor was tested for the following markers: CA-125 Results of the tumor marker test revealed 8.6   01/25/2019 Imaging   Ct abdomen and pelvis Signs of omentectomy and hysterectomy without definitive signs of residual recurrent disease. Small nodular focus bridging rectum and vagina is stable dating back January to 12/14/2018, potentially postoperative but suggest attention on subsequent imaging.   01/25/2019 Tumor Marker   Patient's tumor was tested for the following markers: CA-125 Results of the tumor marker test revealed 10.3   03/15/2019 Tumor Marker   Patient's tumor was tested for the following markers: CA-125 Results of the tumor marker test revealed 9.6   03/23/2019 Imaging   No acute findings in the abdomen or pelvis.   Colonic diverticulosis.  No active diverticulitis.   Prior open tectum E and hysterectomy.   Aortic atherosclerosis.     05/10/2019 Tumor Marker   Patient's tumor was tested for the following markers: CA-125 Results of the tumor marker test revealed 11.8   05/22/2019 Imaging   1. Stable exam. No evidence of recurrent or metastatic carcinoma within the abdomen or pelvis. 2. Colonic diverticulosis, without radiographic evidence of diverticulitis.  11/08/2019 Imaging   1. Status post hysterectomy and bilateral oophorectomy. 2. Soft tissue thickening within the deep pelvis which when compared to multiple prior exams is felt to be slowly progressive. Especially given the elevated CA  125 level, this is suspicious for peritoneal recurrence. Potential imaging strategies include PET (which may be of low sensitivity secondary to the lack of well-defined dominant mass) or pelvic CT follow-up at 3 months. 3. Otherwise, no evidence of metastatic disease within the chest, abdomen, or pelvis. 4. Coronary artery atherosclerosis. Aortic Atherosclerosis (ICD10-I70.0). 5. Ventral abdominal wall hernia containing nonobstructive transverse colon, as before. 6.  Possible constipation.   12/21/2019 Tumor Marker   Patient's tumor was tested for the following markers: 95.2. Results of the tumor marker test revealed CA-`125.   01/17/2020 Imaging   1. Similar appearance of nodularity in the central pelvis along the vaginal cuff. There is a particular nodular soft tissue density measuring 2.2 x 1.6 cm along the wall of the proximal rectum that is slightly more conspicuous than previously, raising concern for possible minimal progression. Similar appearance of the other areas of nodular soft tissue in the central pelvis. PET-CT may prove helpful to further evaluate. 2. No ascites. 3. Left colonic diverticulosis without diverticulitis. 4. 1.7 cm exophytic lesion upper pole left kidney with well-defined homogeneous attenuation slightly higher than would be expected for simple fluid. This is probably a complex cyst. Attention on follow-up recommended. 5. Aortic Atherosclerosis (ICD10-I70.0).   01/17/2020 Tumor Marker   Patient's tumor was tested for the following markers: CA-125 Results of the tumor marker test revealed 135   01/30/2020 Echocardiogram   1. Left ventricular ejection fraction, by estimation, is 55 to 60%. Left ventricular ejection fraction by PLAX is 55 %. The left ventricle has normal function. The left ventricle has no regional wall motion abnormalities. Left ventricular diastolic parameters are indeterminate.  2. Right ventricular systolic function is normal. The right ventricular  size is normal.  3. Left atrial size was borderline dilated.  4. The mitral valve is grossly normal. Trivial mitral valve regurgitation.  5. The aortic valve was not well visualized. Aortic valve regurgitation is trivial.  6. The inferior vena cava is normal in size with greater than 50% respiratory variability, suggesting right atrial pressure of 3 mmHg.   02/04/2020 -  Chemotherapy   The patient had palonosetron (ALOXI) injection 0.25 mg, 0.25 mg, Intravenous,  Once, 1 of 6 cycles Administration: 0.25 mg (02/04/2020) CARBOplatin (PARAPLATIN) 390 mg in sodium chloride 0.9 % 250 mL chemo infusion, 390 mg (100 % of original dose 389.5 mg), Intravenous,  Once, 1 of 6 cycles Dose modification:   (original dose 389.5 mg, Cycle 1) Administration: 390 mg (02/04/2020) DOXOrubicin HCL LIPOSOMAL (DOXIL) 50 mg in dextrose 5 % 250 mL chemo infusion, 28 mg/m2 = 54 mg, Intravenous,  Once, 1 of 6 cycles Administration: 50 mg (02/04/2020) fosaprepitant (EMEND) 150 mg in sodium chloride 0.9 % 145 mL IVPB, 150 mg, Intravenous,  Once, 1 of 6 cycles Administration: 150 mg (02/04/2020)  for chemotherapy treatment.      REVIEW OF SYSTEMS:   Constitutional: Denies fevers, chills or abnormal weight loss Eyes: Denies blurriness of vision Ears, nose, mouth, throat, and face: Denies mucositis or sore throat Respiratory: Denies cough, dyspnea or wheezes Cardiovascular: Denies palpitation, chest discomfort or lower extremity swelling Gastrointestinal:  Denies nausea, heartburn or change in bowel habits Skin: Denies abnormal skin rashes Lymphatics: Denies new lymphadenopathy or easy bruising Neurological:Denies numbness, tingling or new weaknesses  Behavioral/Psych: Mood is stable, no new changes  All other systems were reviewed with the patient and are negative.  I have reviewed the past medical history, past surgical history, social history and family history with the patient and they are unchanged from  previous note.  ALLERGIES:  is allergic to ivp dye [iodinated diagnostic agents], propoxyphene, codeine, and ultram [tramadol hcl].  MEDICATIONS:  Current Outpatient Medications  Medication Sig Dispense Refill  . Biotin 1 MG CAPS Take 1 tablet by mouth daily.    . carboxymethylcellulose (REFRESH PLUS) 0.5 % SOLN Place 1 drop into both eyes 3 (three) times daily as needed (for dry eyes.).    Marland Kitchen Cholecalciferol (VITAMIN D3 SUPER STRENGTH) 50 MCG (2000 UT) TABS Take 2,000 Units by mouth daily.    Marland Kitchen lubiprostone (AMITIZA) 8 MCG capsule Take 1 capsule (8 mcg total) by mouth 2 (two) times daily with a meal. Pharmacy- d/c rx for linzess. Not covered well by insurance 60 capsule 1  . ondansetron (ZOFRAN) 8 MG tablet TAKE 1 TABLET (8 MG TOTAL) BY MOUTH EVERY 8 (EIGHT) HOURS AS NEEDED FOR REFRACTORY NAUSEA / VOMITING. START ON DAY 3 AFTER CHEMO. 30 tablet 1  . oxyCODONE (OXY IR/ROXICODONE) 5 MG immediate release tablet Take 1 tablet (5 mg total) by mouth every 4 (four) hours as needed for severe pain. 30 tablet 0  . polyethylene glycol (MIRALAX / GLYCOLAX) 17 g packet Take 17 g by mouth daily as needed. (Patient not taking: Reported on 10/25/2019)    . prochlorperazine (COMPAZINE) 10 MG tablet Take 1 tablet (10 mg total) by mouth every 6 (six) hours as needed (Nausea or vomiting). 30 tablet 1  . triamcinolone cream (KENALOG) 0.5 % Apply 1 application topically 2 (two) times daily. Apply to eyelids/eyebrows  4   No current facility-administered medications for this visit.    PHYSICAL EXAMINATION: ECOG PERFORMANCE STATUS: 1 - Symptomatic but completely ambulatory  Vitals:   02/26/20 1153  BP: (!) 153/76  Pulse: 84  Temp: (!) 97.4 F (36.3 C)  SpO2: 100%   Filed Weights   02/26/20 1153  Weight: 155 lb 3.2 oz (70.4 kg)    GENERAL:alert, no distress and comfortable SKIN: skin color, texture, turgor are normal, no rashes or significant lesions EYES: normal, Conjunctiva are pink and non-injected,  sclera clear OROPHARYNX:no exudate, no erythema and lips, buccal mucosa, and tongue normal  NECK: supple, thyroid normal size, non-tender, without nodularity LYMPH:  no palpable lymphadenopathy in the cervical, axillary or inguinal LUNGS: clear to auscultation and percussion with normal breathing effort HEART: regular rate & rhythm and no murmurs and no lower extremity edema ABDOMEN:abdomen soft, mild tenderness to deep palpation on the left lower quadrant, without rebound or guarding Musculoskeletal:no cyanosis of digits and no clubbing  NEURO: alert & oriented x 3 with fluent speech, no focal motor/sensory deficits  LABORATORY DATA:  I have reviewed the data as listed    Component Value Date/Time   NA 140 01/30/2020 1134   K 3.7 01/30/2020 1134   CL 107 01/30/2020 1134   CO2 29 01/30/2020 1134   GLUCOSE 150 (H) 01/30/2020 1134   BUN 12 01/30/2020 1134   CREATININE 0.74 01/30/2020 1134   CALCIUM 9.5 01/30/2020 1134   PROT 7.0 01/30/2020 1134   ALBUMIN 3.7 01/30/2020 1134   AST 16 01/30/2020 1134   ALT 16 01/30/2020 1134   ALKPHOS 64 01/30/2020 1134   BILITOT 0.5 01/30/2020 1134   GFRNONAA >60 01/30/2020 1134   GFRAA >  60 01/17/2020 1339    No results found for: SPEP, UPEP  Lab Results  Component Value Date   WBC 10.1 01/30/2020   NEUTROABS 6.2 01/30/2020   HGB 13.5 01/30/2020   HCT 42.1 01/30/2020   MCV 86.6 01/30/2020   PLT 197 01/30/2020      Chemistry      Component Value Date/Time   NA 140 01/30/2020 1134   K 3.7 01/30/2020 1134   CL 107 01/30/2020 1134   CO2 29 01/30/2020 1134   BUN 12 01/30/2020 1134   CREATININE 0.74 01/30/2020 1134      Component Value Date/Time   CALCIUM 9.5 01/30/2020 1134   ALKPHOS 64 01/30/2020 1134   AST 16 01/30/2020 1134   ALT 16 01/30/2020 1134   BILITOT 0.5 01/30/2020 1134

## 2020-02-27 NOTE — Assessment & Plan Note (Signed)
I suspect she might have flare of diverticulosis She is currently on modified diet and she felt slight improvement Continue conservative approach for now I do not believe she needs antibiotic therapy

## 2020-02-27 NOTE — Assessment & Plan Note (Signed)
She has acute on chronic pain I recommend increasing oxycodone and to add acetaminophen as needed She is doing better and will continue to take pain medicine as prescribed

## 2020-02-27 NOTE — Assessment & Plan Note (Signed)
She continues to have nonspecific right upper quadrant pain and left lower quadrant pain, slightly better compared to our previous visit It is not related to chemo, rather could be due to flare of diverticulosis or symptoms related to her disease I recommend conservative approach Her examination is benign We will manage this conservatively  I will see her and reexamine her again next week prior to cycle 2 of therapy

## 2020-03-03 ENCOUNTER — Encounter: Payer: Self-pay | Admitting: Hematology and Oncology

## 2020-03-03 ENCOUNTER — Inpatient Hospital Stay: Payer: Medicare HMO

## 2020-03-03 ENCOUNTER — Other Ambulatory Visit: Payer: Self-pay

## 2020-03-03 ENCOUNTER — Telehealth: Payer: Self-pay | Admitting: Oncology

## 2020-03-03 ENCOUNTER — Other Ambulatory Visit: Payer: Self-pay | Admitting: Hematology and Oncology

## 2020-03-03 ENCOUNTER — Inpatient Hospital Stay (HOSPITAL_BASED_OUTPATIENT_CLINIC_OR_DEPARTMENT_OTHER): Payer: Medicare HMO | Admitting: Hematology and Oncology

## 2020-03-03 DIAGNOSIS — C5701 Malignant neoplasm of right fallopian tube: Secondary | ICD-10-CM

## 2020-03-03 DIAGNOSIS — C569 Malignant neoplasm of unspecified ovary: Secondary | ICD-10-CM

## 2020-03-03 DIAGNOSIS — K5909 Other constipation: Secondary | ICD-10-CM | POA: Diagnosis not present

## 2020-03-03 DIAGNOSIS — R030 Elevated blood-pressure reading, without diagnosis of hypertension: Secondary | ICD-10-CM

## 2020-03-03 DIAGNOSIS — Z5111 Encounter for antineoplastic chemotherapy: Secondary | ICD-10-CM | POA: Diagnosis not present

## 2020-03-03 DIAGNOSIS — Z7189 Other specified counseling: Secondary | ICD-10-CM

## 2020-03-03 DIAGNOSIS — G893 Neoplasm related pain (acute) (chronic): Secondary | ICD-10-CM | POA: Diagnosis not present

## 2020-03-03 LAB — CBC WITH DIFFERENTIAL (CANCER CENTER ONLY)
Abs Immature Granulocytes: 0.05 10*3/uL (ref 0.00–0.07)
Basophils Absolute: 0 10*3/uL (ref 0.0–0.1)
Basophils Relative: 0 %
Eosinophils Absolute: 0.1 10*3/uL (ref 0.0–0.5)
Eosinophils Relative: 1 %
HCT: 37.2 % (ref 36.0–46.0)
Hemoglobin: 12.1 g/dL (ref 12.0–15.0)
Immature Granulocytes: 1 %
Lymphocytes Relative: 31 %
Lymphs Abs: 2.8 10*3/uL (ref 0.7–4.0)
MCH: 28.4 pg (ref 26.0–34.0)
MCHC: 32.5 g/dL (ref 30.0–36.0)
MCV: 87.3 fL (ref 80.0–100.0)
Monocytes Absolute: 1.1 10*3/uL — ABNORMAL HIGH (ref 0.1–1.0)
Monocytes Relative: 13 %
Neutro Abs: 4.9 10*3/uL (ref 1.7–7.7)
Neutrophils Relative %: 54 %
Platelet Count: 224 10*3/uL (ref 150–400)
RBC: 4.26 MIL/uL (ref 3.87–5.11)
RDW: 14.2 % (ref 11.5–15.5)
WBC Count: 9.1 10*3/uL (ref 4.0–10.5)
nRBC: 0 % (ref 0.0–0.2)

## 2020-03-03 LAB — CMP (CANCER CENTER ONLY)
ALT: 14 U/L (ref 0–44)
AST: 18 U/L (ref 15–41)
Albumin: 3.5 g/dL (ref 3.5–5.0)
Alkaline Phosphatase: 63 U/L (ref 38–126)
Anion gap: 6 (ref 5–15)
BUN: 12 mg/dL (ref 8–23)
CO2: 27 mmol/L (ref 22–32)
Calcium: 8.9 mg/dL (ref 8.9–10.3)
Chloride: 107 mmol/L (ref 98–111)
Creatinine: 0.7 mg/dL (ref 0.44–1.00)
GFR, Estimated: >60 mL/min (ref >60)
Glucose, Bld: 96 mg/dL (ref 70–99)
Potassium: 4 mmol/L (ref 3.5–5.1)
Sodium: 140 mmol/L (ref 135–145)
Total Bilirubin: 0.3 mg/dL (ref 0.3–1.2)
Total Protein: 6.6 g/dL (ref 6.5–8.1)

## 2020-03-03 MED ORDER — DEXTROSE 5 % IV SOLN
Freq: Once | INTRAVENOUS | Status: AC
  Filled 2020-03-03: qty 250

## 2020-03-03 MED ORDER — PREDNISONE 50 MG PO TABS: ORAL_TABLET | ORAL | 0 refills | Status: DC

## 2020-03-03 MED ORDER — HEPARIN SOD (PORK) LOCK FLUSH 100 UNIT/ML IV SOLN
500.0000 [IU] | Freq: Once | INTRAVENOUS | Status: AC | PRN
  Administered 2020-03-03: 500 [IU]
  Filled 2020-03-03: qty 5

## 2020-03-03 MED ORDER — PALONOSETRON HCL INJECTION 0.25 MG/5ML
0.2500 mg | Freq: Once | INTRAVENOUS | Status: AC
  Administered 2020-03-03: 0.25 mg via INTRAVENOUS

## 2020-03-03 MED ORDER — SODIUM CHLORIDE 0.9% FLUSH
10.0000 mL | Freq: Once | INTRAVENOUS | Status: AC
  Administered 2020-03-03: 10 mL
  Filled 2020-03-03: qty 10

## 2020-03-03 MED ORDER — SODIUM CHLORIDE 0.9% FLUSH
10.0000 mL | INTRAVENOUS | Status: DC | PRN
  Administered 2020-03-03: 10 mL
  Filled 2020-03-03: qty 10

## 2020-03-03 MED ORDER — PALONOSETRON HCL INJECTION 0.25 MG/5ML
INTRAVENOUS | Status: AC
  Filled 2020-03-03: qty 5

## 2020-03-03 MED ORDER — SODIUM CHLORIDE 0.9 % IV SOLN
150.0000 mg | Freq: Once | INTRAVENOUS | Status: AC
  Administered 2020-03-03: 150 mg via INTRAVENOUS
  Filled 2020-03-03: qty 150

## 2020-03-03 MED ORDER — OXYCODONE HCL 10 MG PO TABS: 10.0000 mg | ORAL_TABLET | ORAL | 0 refills | Status: DC | PRN

## 2020-03-03 MED ORDER — DOXORUBICIN HCL LIPOSOMAL CHEMO INJECTION 2 MG/ML
28.0000 mg/m2 | Freq: Once | INTRAVENOUS | Status: AC
  Administered 2020-03-03: 50 mg via INTRAVENOUS
  Filled 2020-03-03: qty 25

## 2020-03-03 MED ORDER — SODIUM CHLORIDE 0.9 % IV SOLN
389.5000 mg | Freq: Once | INTRAVENOUS | Status: AC
  Administered 2020-03-03: 390 mg via INTRAVENOUS
  Filled 2020-03-03: qty 39

## 2020-03-03 MED ORDER — SODIUM CHLORIDE 0.9 % IV SOLN
10.0000 mg | Freq: Once | INTRAVENOUS | Status: AC
  Administered 2020-03-03: 10 mg via INTRAVENOUS
  Filled 2020-03-03: qty 10

## 2020-03-03 MED FILL — oxyCODONE HCL 10 MG TABS: 10 | 10 days supply | Qty: 60 | Fill #0

## 2020-03-03 NOTE — Progress Notes (Signed)
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) She continues to be distressed with severe intermittent abdominal pain We will proceed with cycle 2 of chemotherapy today but I would like to order urgent CT imaging to assess response to therapy as soon as possible She is in agreement  Cancer associated pain She continues to have intense, intermittent abdominal pain Last night, she was not able to sleep because of this I recommend increasing the dose of oxycodone to 10 mg to take as needed We will order CT imaging as soon as possible for objective assessment of response to therapy and to evaluate the cause of her abdominal pain  Elevated BP without diagnosis of hypertension Her blood pressure is grossly elevated likely exacerbated by pain As above, we will increase the dose of pain medicine I do not plan to add blood pressure medication for now  Other constipation She denies constipation She will continue aggressive laxative therapy while on increased pain medicine   No orders of the defined types were placed in this encounter.   All questions were answered. The patient knows to call the clinic with any problems, questions or concerns. The total time spent in the appointment was 40 minutes encounter with patients including review of chart and various tests results, discussions about plan of care and coordination of care plan   [name], MD 03/03/2020 1:38 PM  INTERVAL HISTORY: Please see below for problem oriented charting. She returns to be seen prior to cycle 2 of treatment Her husband is present She is very distressed today because of unresolved abdominal pain She has pain in the right lower quadrant and left lower quadrant She has to take oxycodone 5 mg 4 times a day and  that does not bring much relief Last night, her pain was unbearable and she have to double up on oxycodone to get reasonable pain control She denies nausea or vomiting Her bowel habits are normal if she takes regular laxative She did not notice any changes to her stool Her pain also somewhat aggravated when she tries to walk.  Denies recent chest pain or shortness of breath Denies abnormal vaginal bleeding  SUMMARY OF ONCOLOGIC HISTORY: Oncology History Overview Note  High grade serous, right fallopian tube  HRD, BRCA tumor testing were neg   Ovarian cancer ([place])  03/21/2018 Initial Diagnosis   Ovarian cancer ([place])   02/04/2020 -  Chemotherapy   The patient had palonosetron (ALOXI) injection 0.25 mg, 0.25 mg, Intravenous,  Once, 2 of 6 cycles Administration: 0.25 mg (02/04/2020) CARBOplatin (PARAPLATIN) 390 mg in sodium chloride 0.9 % 250 mL chemo infusion, 390 mg (100 % of original dose 389.5 mg), Intravenous,  Once, 2 of 6 cycles Dose modification:   (original dose 389.5 mg, Cycle 1) Administration: 390 mg (02/04/2020) DOXOrubicin HCL LIPOSOMAL (DOXIL) 50 mg in dextrose 5 % 250 mL chemo infusion, 28 mg/m2 = 54 mg, Intravenous,  Once, 2 of 6 cycles Administration: 50 mg (02/04/2020) fosaprepitant (EMEND) 150 mg in sodium chloride 0.9 % 145 mL IVPB, 150 mg, Intravenous,  Once, 2 of 6 cycles Administration: 150 mg (02/04/2020)  for chemotherapy treatment.    Fallopian tube cancer, carcinoma, right ([place])  10/24/2017 Initial Diagnosis   She has presentation of vague abdominal pain   02/20/2018 Imaging   Outside CT abdomen  and pelvis: This revealed an incidental finding of a 1.5 cm subcapsular lesion in the lateral upper pole of the left kidney which measured higher than fluid attenuation which could represent a proteinaceous renal cyst or solid renal mass.  There was no ascites.  There is no lymphadenopathy.  There were no masses in the pelvis including ovarian or adnexal masses seen.   There was however soft tissue stranding seen with nodularity in the omental fat in the lower abdomen without evidence of ascites.  This was felt to represent either carcinomatosis or inflammatory infectious etiologies.  There was colonic diverticulosis seen without radiographic evidence of diverticulitis.  The radiologist recommended MRI of the abdomen to further work-up the renal mass   02/22/2018 Tumor Marker   Patient's tumor was tested for the following markers: CA-125. Results of the tumor marker test revealed 103.   03/06/2018 Pathology Results   Omentum, biopsy - ADENOCARCINOMA WITH PSAMMOMA BODIES, SEE COMMENT. Microscopic Comment There are scattered small foci of malignant glands with associated psammoma bodies. While the tumor is somewhat limited the cells have a more low grade appearance. There is prominent lymphovascular invasion. Immunohistochemistry reveals the cells are positive for cytokeratin 7, ER, MOC31, PAX8, and WT-1. They are negative for calretinin, cytokeratin 20, CDX-2, and TTF-1. Overall, the findings are consistent with adenocarcinoma. The differential includes a primary gynecologic or peritoneal tumor, although the morphology is not typical of a high grade serous carcinoma.   03/06/2018 Surgery   Surgeon: Donaciano Eva   Pre-operative Diagnosis: omental mass, elevated CA 125 Operation: Laparoscopic omentectomy  Surgeon: Donaciano Eva  Operative Findings:  : normal diaphragm and upper omentum. Sigmoid colon densely adherent to left pelvic side wall consistent with history of diverticulitis. Omentum adherent to anterior abdominal wall and sigmoid colon. Surgically absent uterus. Right ovary very small and grossly normal. Left ovary obscured by adhesed sigmoid colon. No ascites. No carcinomatosis. There was a nodular firm distal omentum on the left which was adherent to the sigmoid colon and most consistent with inflammatory change.       03/21/2018 Pathology Results   1. Soft tissue mass, simple excision, midline abdominal wall mass - METASTATIC SEROUS CARCINOMA. 2. Omentum, resection for tumor - METASTATIC SEROUS CARCINOMA. 3. Adnexa - ovary +/- tube, neoplastic, right - RIGHT FALLOPIAN TUBE: -HIGH GRADE SEROUS CARCINOMA. -SEE ONCOLOGY TABLE. - RIGHT OVARY: SURFACE PSAMMOMA BODIES. NO DEFINITIVE MALIGNANT CELLS. -INCLUSION CYSTS. 4. Adnexa - ovary +/- tube, neoplastic, left - LEFT FALLOPIAN TUBE: LUMINAL AND SURFACE SEROUS CARCINOMA. - LEFT OVARY: FOCAL PSAMMOMA BODIES AND MALIGNANT CELLS. 5. Soft tissue mass, simple excision, left abdominal wall - METASTATIC SEROUS CARCINOMA. Microscopic Comment 3. OVARY or FALLOPIAN TUBE or PRIMARY PERITONEUM: Procedure: Bilateral salpingo-oophorectomy. Abdominal wall mass excision and omentectomy. Specimen Integrity: Intact. Tumor Site: Right fallopian tube. Ovarian Surface Involvement (required only if applicable): Present (left). Fallopian Tube Surface Involvement (required only if applicable): Present. Tumor Size: 1.5 cm. Histologic Type: High grade serous carcinoma. Histologic Grade: High grade. Implants (required for advanced stage serous/seromucinous borderline tumors only): Present. Other Tissue/ Organ Involvement: Omentum, abdominal wall, left fallopian tube and ovary. Largest Extrapelvic Peritoneal Focus (required only if applicable): >2 cm. Peritoneal/Ascitic Fluid: N/A. Treatment Effect (required only for high-grade serous carcinomas): N/A. Regional Lymph Nodes: No lymph nodes submitted or found Pathologic Stage Classification (pTNM, AJCC 8th Edition): pT3c, pNX Representative Tumor Block: 3A Comment(s): None.   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   Surgeon: Thereasa Solo, MD. Specimens: Bilateral tubes / ovaries, omentum. Midline abdominal wall mass, left lateral abdominal wall mass.    Operative Findings:  Abdominal wall masses consistent with port site metastases at the umbilical incision (7cm) and left lateral incision (5cm). Omental caking in the infracolic omentum. Grossly normal very small tubes and ovaries. Normal diaphragms. No lymphadenopathy. Normal small and large intestine with the exception of miliary studding of tumor on the surface of the sigmoid colon and rectum in the cul de sac.    This represented an optimal cytoreduction (R1) with no gross visible disease remaining, however there was a thin rind of tumor on the lateral left fascia associated with the port site metastasis.     04/11/2018 Cancer Staging   Staging form: Ovary, Fallopian Tube, and Primary Peritoneal Carcinoma, AJCC 8th Edition - Pathologic: FIGO Stage IIIC (pT3, pN0, cM0) - Signed by [name], MD on 04/11/2018   05/23/2018 Imaging   1. Interval resolution of the anterior midline abdominal wall seroma. There is some persistent wispy soft tissue attenuation in the region of the midline incision, nonspecific and may simply reflect granulation tissue from surgery. 2. Stable 13 mm exophytic lesion posterior left kidney with attenuation higher than expected for a simple cyst. This may be a cyst complicated by proteinaceous debris or hemorrhage, but attention on follow-up recommended. 3. Stable mild persistent distention of proximal jejunal loops with gradual tapering to nondilated distal small bowel. 4. No overt peritoneal or mesenteric disease identified on this exam.   05/23/2018 Tumor Marker   Patient's tumor was tested for the following markers: CA-125 Results of the tumor marker test revealed 11.6   06/02/2018 - 09/20/2018 Chemotherapy   The patient had carboplatin and taxol   06/19/2018 Procedure   Status post right IJ port catheter  placement. Catheter ready for use.   06/26/2018 Tumor Marker   Patient's tumor was tested for the following markers: CA-125. Results of the tumor marker test revealed 10.7   09/20/2018 Tumor Marker   Patient's tumor was tested for the following markers: CA-125. Results of the tumor marker test revealed 9.1   10/25/2018 Imaging   1. There is suspicious, abnormal asymmetric increased soft tissue involving the cecum and ascending colon. This is suspicious for residual/progressive serosal involvement by tumor. 2. No additional sites of disease identified. No evidence for nodal metastasis, solid organ metastasis or ascites.     11/07/2018 Procedure   She had negative colonoscopy evaluation   12/05/2018 Tumor Marker   Patient's tumor was tested for the following markers: CA-125 Results of the tumor marker test revealed 8.6   01/25/2019 Imaging   Ct abdomen and pelvis Signs of omentectomy and hysterectomy without definitive signs of residual recurrent disease. Small nodular focus bridging rectum and vagina is stable dating back January to 12/14/2018, potentially postoperative but suggest attention on subsequent imaging.   01/25/2019 Tumor Marker   Patient's tumor was tested for the following markers: CA-125 Results of the tumor marker test revealed 10.3   03/15/2019 Tumor Marker   Patient's tumor was tested for the following markers: CA-125 Results of the tumor marker test revealed 9.6   03/23/2019 Imaging   No acute findings in the abdomen or pelvis.   Colonic diverticulosis.  No active diverticulitis.   Prior open tectum E and hysterectomy.   Aortic atherosclerosis.  05/10/2019 Tumor Marker   Patient's tumor was tested for the following markers: CA-125 Results of the tumor marker test revealed 11.8   05/22/2019 Imaging   1. Stable exam. No evidence of recurrent or metastatic carcinoma within the abdomen or pelvis. 2. Colonic diverticulosis, without radiographic evidence of  diverticulitis.     11/08/2019 Imaging   1. Status post hysterectomy and bilateral oophorectomy. 2. Soft tissue thickening within the deep pelvis which when compared to multiple prior exams is felt to be slowly progressive. Especially given the elevated CA 125 level, this is suspicious for peritoneal recurrence. Potential imaging strategies include PET (which may be of low sensitivity secondary to the lack of well-defined dominant mass) or pelvic CT follow-up at 3 months. 3. Otherwise, no evidence of metastatic disease within the chest, abdomen, or pelvis. 4. Coronary artery atherosclerosis. Aortic Atherosclerosis (ICD10-I70.0). 5. Ventral abdominal wall hernia containing nonobstructive transverse colon, as before. 6.  Possible constipation.   12/21/2019 Tumor Marker   Patient's tumor was tested for the following markers: 95.2. Results of the tumor marker test revealed CA-`125.   01/17/2020 Imaging   1. Similar appearance of nodularity in the central pelvis along the vaginal cuff. There is a particular nodular soft tissue density measuring 2.2 x 1.6 cm along the wall of the proximal rectum that is slightly more conspicuous than previously, raising concern for possible minimal progression. Similar appearance of the other areas of nodular soft tissue in the central pelvis. PET-CT may prove helpful to further evaluate. 2. No ascites. 3. Left colonic diverticulosis without diverticulitis. 4. 1.7 cm exophytic lesion upper pole left kidney with well-defined homogeneous attenuation slightly higher than would be expected for simple fluid. This is probably a complex cyst. Attention on follow-up recommended. 5. Aortic Atherosclerosis (ICD10-I70.0).   01/17/2020 Tumor Marker   Patient's tumor was tested for the following markers: CA-125 Results of the tumor marker test revealed 135   01/30/2020 Echocardiogram   1. Left ventricular ejection fraction, by estimation, is 55 to 60%. Left ventricular ejection  fraction by PLAX is 55 %. The left ventricle has normal function. The left ventricle has no regional wall motion abnormalities. Left ventricular diastolic parameters are indeterminate.  2. Right ventricular systolic function is normal. The right ventricular size is normal.  3. Left atrial size was borderline dilated.  4. The mitral valve is grossly normal. Trivial mitral valve regurgitation.  5. The aortic valve was not well visualized. Aortic valve regurgitation is trivial.  6. The inferior vena cava is normal in size with greater than 50% respiratory variability, suggesting right atrial pressure of 3 mmHg.   02/04/2020 -  Chemotherapy   The patient had palonosetron (ALOXI) injection 0.25 mg, 0.25 mg, Intravenous,  Once, 2 of 6 cycles Administration: 0.25 mg (02/04/2020) CARBOplatin (PARAPLATIN) 390 mg in sodium chloride 0.9 % 250 mL chemo infusion, 390 mg (100 % of original dose 389.5 mg), Intravenous,  Once, 2 of 6 cycles Dose modification:   (original dose 389.5 mg, Cycle 1) Administration: 390 mg (02/04/2020) DOXOrubicin HCL LIPOSOMAL (DOXIL) 50 mg in dextrose 5 % 250 mL chemo infusion, 28 mg/m2 = 54 mg, Intravenous,  Once, 2 of 6 cycles Administration: 50 mg (02/04/2020) fosaprepitant (EMEND) 150 mg in sodium chloride 0.9 % 145 mL IVPB, 150 mg, Intravenous,  Once, 2 of 6 cycles Administration: 150 mg (02/04/2020)  for chemotherapy treatment.      REVIEW OF SYSTEMS:   Constitutional: Denies fevers, chills or abnormal weight loss Eyes: Denies blurriness of  vision Ears, nose, mouth, throat, and face: Denies mucositis or sore throat Respiratory: Denies cough, dyspnea or wheezes Cardiovascular: Denies palpitation, chest discomfort or lower extremity swelling Skin: Denies abnormal skin rashes Lymphatics: Denies new lymphadenopathy or easy bruising Neurological:Denies numbness, tingling or new weaknesses Behavioral/Psych: Mood is stable, no new changes  All other systems were reviewed  with the patient and are negative.  I have reviewed the past medical history, past surgical history, social history and family history with the patient and they are unchanged from previous note.  ALLERGIES:  is allergic to ivp dye [iodinated diagnostic agents], propoxyphene, codeine, and ultram [tramadol hcl].  MEDICATIONS:  Current Outpatient Medications  Medication Sig Dispense Refill  . Biotin 1 MG CAPS Take 1 tablet by mouth daily.    . carboxymethylcellulose (REFRESH PLUS) 0.5 % SOLN Place 1 drop into both eyes 3 (three) times daily as needed (for dry eyes.).    Marland Kitchen Cholecalciferol (VITAMIN D3 SUPER STRENGTH) 50 MCG (2000 UT) TABS Take 2,000 Units by mouth daily.    Marland Kitchen lubiprostone (AMITIZA) 8 MCG capsule Take 1 capsule (8 mcg total) by mouth 2 (two) times daily with a meal. Pharmacy- d/c rx for linzess. Not covered well by insurance 60 capsule 1  . ondansetron (ZOFRAN) 8 MG tablet TAKE 1 TABLET (8 MG TOTAL) BY MOUTH EVERY 8 (EIGHT) HOURS AS NEEDED FOR REFRACTORY NAUSEA / VOMITING. START ON DAY 3 AFTER CHEMO. 30 tablet 1  . oxyCODONE 10 MG TABS Take 1 tablet (10 mg total) by mouth every 4 (four) hours as needed for severe pain. 60 tablet 0  . polyethylene glycol (MIRALAX / GLYCOLAX) 17 g packet Take 17 g by mouth daily as needed. (Patient not taking: Reported on 10/25/2019)    . prochlorperazine (COMPAZINE) 10 MG tablet Take 1 tablet (10 mg total) by mouth every 6 (six) hours as needed (Nausea or vomiting). 30 tablet 1  . triamcinolone cream (KENALOG) 0.5 % Apply 1 application topically 2 (two) times daily. Apply to eyelids/eyebrows  4   No current facility-administered medications for this visit.   Facility-Administered Medications Ordered in Other Visits  Medication Dose Route Frequency Provider Last Rate Last Admin  . CARBOplatin (PARAPLATIN) 390 mg in sodium chloride 0.9 % 250 mL chemo infusion  390 mg Intravenous Once Alvy Bimler, Ranita Stjulien, MD      . dexamethasone (DECADRON) 10 mg in sodium  chloride 0.9 % 50 mL IVPB  10 mg Intravenous Once Alvy Bimler, Tesa Meadors, MD      . DOXOrubicin HCL LIPOSOMAL (DOXIL) 50 mg in dextrose 5 % 250 mL chemo infusion  28 mg/m2 (Treatment Plan Recorded) Intravenous Once Alvy Bimler, Gissella Niblack, MD      . fosaprepitant (EMEND) 150 mg in sodium chloride 0.9 % 145 mL IVPB  150 mg Intravenous Once Alvy Bimler, Elvyn Krohn, MD      . heparin lock flush 100 unit/mL  500 Units Intracatheter Once PRN Alvy Bimler, Mili Piltz, MD      . palonosetron (ALOXI) injection 0.25 mg  0.25 mg Intravenous Once King Pinzon, MD      . sodium chloride flush (NS) 0.9 % injection 10 mL  10 mL Intracatheter PRN Alvy Bimler, Bayle Calvo, MD        PHYSICAL EXAMINATION: ECOG PERFORMANCE STATUS: 2 - Symptomatic, <50% confined to bed  Vitals:   03/03/20 1214  BP: (!) 164/84  Pulse: 80  Resp: 18  Temp: (!) 97.3 F (36.3 C)  SpO2: 98%   Filed Weights   03/03/20 1214  Weight: 154 lb  12.8 oz (70.2 kg)    GENERAL:alert, in mild distress and looks uncomfortable SKIN: skin color, texture, turgor are normal, no rashes or significant lesions EYES: normal, Conjunctiva are pink and non-injected, sclera clear OROPHARYNX:no exudate, no erythema and lips, buccal mucosa, and tongue normal  NECK: supple, thyroid normal size, non-tender, without nodularity LYMPH:  no palpable lymphadenopathy in the cervical, axillary or inguinal LUNGS: clear to auscultation and percussion with normal breathing effort HEART: regular rate & rhythm and no murmurs and no lower extremity edema ABDOMEN:abdomen soft, nondiscrete tenderness/discomfort on the left lower quadrant and right lower quadrant without rebound or guarding.  Normal bowel sounds Musculoskeletal:no cyanosis of digits and no clubbing  NEURO: alert & oriented x 3 with fluent speech, no focal motor/sensory deficits  LABORATORY DATA:  I have reviewed the data as listed    Component Value Date/Time   NA 140 03/03/2020 1205   K 4.0 03/03/2020 1205   CL 107 03/03/2020 1205   CO2 27 03/03/2020  1205   GLUCOSE 96 03/03/2020 1205   BUN 12 03/03/2020 1205   CREATININE 0.70 03/03/2020 1205   CALCIUM 8.9 03/03/2020 1205   PROT 6.6 03/03/2020 1205   ALBUMIN 3.5 03/03/2020 1205   AST 18 03/03/2020 1205   ALT 14 03/03/2020 1205   ALKPHOS 63 03/03/2020 1205   BILITOT 0.3 03/03/2020 1205   GFRNONAA >60 03/03/2020 1205   GFRAA >60 01/17/2020 1339    No results found for: SPEP, UPEP  Lab Results  Component Value Date   WBC 9.1 03/03/2020   NEUTROABS 4.9 03/03/2020   HGB 12.1 03/03/2020   HCT 37.2 03/03/2020   MCV 87.3 03/03/2020   PLT 224 03/03/2020      Chemistry      Component Value Date/Time   NA 140 03/03/2020 1205   K 4.0 03/03/2020 1205   CL 107 03/03/2020 1205   CO2 27 03/03/2020 1205   BUN 12 03/03/2020 1205   CREATININE 0.70 03/03/2020 1205      Component Value Date/Time   CALCIUM 8.9 03/03/2020 1205   ALKPHOS 63 03/03/2020 1205   AST 18 03/03/2020 1205   ALT 14 03/03/2020 1205   BILITOT 0.3 03/03/2020 1205

## 2020-03-03 NOTE — Patient Instructions (Signed)

## 2020-03-03 NOTE — Assessment & Plan Note (Signed)
She continues to be distressed with severe intermittent abdominal pain We will proceed with cycle 2 of chemotherapy today but I would like to order urgent CT imaging to assess response to therapy as soon as possible She is in agreement

## 2020-03-03 NOTE — Assessment & Plan Note (Signed)
Her blood pressure is grossly elevated likely exacerbated by pain As above, we will increase the dose of pain medicine I do not plan to add blood pressure medication for now

## 2020-03-03 NOTE — Patient Instructions (Signed)
[place] Discharge Instructions for Patients Receiving Chemotherapy  Today you received the following chemotherapy agents: liposomal doxorubicin and carboplatin.  To help prevent nausea and vomiting after your treatment, we encourage you to take your nausea medication as directed.   If you develop nausea and vomiting that is not controlled by your nausea medication, call the clinic.   BELOW ARE SYMPTOMS THAT SHOULD BE REPORTED IMMEDIATELY:  *FEVER GREATER THAN 100.5 F  *CHILLS WITH OR WITHOUT FEVER  NAUSEA AND VOMITING THAT IS NOT CONTROLLED WITH YOUR NAUSEA MEDICATION  *UNUSUAL SHORTNESS OF BREATH  *UNUSUAL BRUISING OR BLEEDING  TENDERNESS IN MOUTH AND THROAT WITH OR WITHOUT PRESENCE OF ULCERS  *URINARY PROBLEMS  *BOWEL PROBLEMS  UNUSUAL RASH Items with * indicate a potential emergency and should be followed up as soon as possible.  Feel free to call the clinic should you have any questions or concerns. The clinic phone number is [phone].  Please show the [place] at check-in to the Emergency Department and triage nurse.

## 2020-03-03 NOTE — Telephone Encounter (Signed)
Misty Hopkins of the CT scan appointment on 03/10/20 at 11:30 at [facility]. She is ok traveling to [facility] for the scan.  Advised her that I will call her tomorrow with instructions for the bendadryl and prednisone.

## 2020-03-03 NOTE — Assessment & Plan Note (Signed)
She continues to have intense, intermittent abdominal pain Last night, she was not able to sleep because of this I recommend increasing the dose of oxycodone to 10 mg to take as needed We will order CT imaging as soon as possible for objective assessment of response to therapy and to evaluate the cause of her abdominal pain

## 2020-03-03 NOTE — Assessment & Plan Note (Signed)
She denies constipation She will continue aggressive laxative therapy while on increased pain medicine

## 2020-03-04 LAB — CA 125: Cancer Antigen (CA) 125: 272 U/mL — ABNORMAL HIGH (ref 0.0–38.1)

## 2020-03-04 NOTE — Telephone Encounter (Signed)
Misty Hopkins and reviewed times to take Prednisone, Benadryl and drink the contrast before her CT on 03/10/20.  She verbalized understanding and agreement of instructions.

## 2020-03-07 ENCOUNTER — Inpatient Hospital Stay (HOSPITAL_COMMUNITY): Payer: Medicare HMO

## 2020-03-07 ENCOUNTER — Other Ambulatory Visit: Payer: Self-pay

## 2020-03-07 ENCOUNTER — Observation Stay (HOSPITAL_COMMUNITY)
Admission: EM | Admit: 2020-03-07 | Discharge: 2020-03-08 | Disposition: A | Discharge status: Home / Self Care | Payer: Medicare HMO | Attending: Internal Medicine | Admitting: Internal Medicine

## 2020-03-07 ENCOUNTER — Encounter (HOSPITAL_COMMUNITY): Payer: Self-pay

## 2020-03-07 DIAGNOSIS — R791 Abnormal coagulation profile: Secondary | ICD-10-CM | POA: Insufficient documentation

## 2020-03-07 DIAGNOSIS — R0789 Other chest pain: Secondary | ICD-10-CM | POA: Diagnosis not present

## 2020-03-07 DIAGNOSIS — R0602 Shortness of breath: Secondary | ICD-10-CM | POA: Diagnosis present

## 2020-03-07 DIAGNOSIS — C5701 Malignant neoplasm of right fallopian tube: Secondary | ICD-10-CM | POA: Diagnosis not present

## 2020-03-07 DIAGNOSIS — R7989 Other specified abnormal findings of blood chemistry: Secondary | ICD-10-CM

## 2020-03-07 DIAGNOSIS — G893 Neoplasm related pain (acute) (chronic): Secondary | ICD-10-CM | POA: Insufficient documentation

## 2020-03-07 DIAGNOSIS — L0291 Cutaneous abscess, unspecified: Secondary | ICD-10-CM

## 2020-03-07 DIAGNOSIS — C5700 Malignant neoplasm of unspecified fallopian tube: Secondary | ICD-10-CM

## 2020-03-07 DIAGNOSIS — R1032 Left lower quadrant pain: Secondary | ICD-10-CM

## 2020-03-07 DIAGNOSIS — R188 Other ascites: Secondary | ICD-10-CM

## 2020-03-07 DIAGNOSIS — Z20822 Contact with and (suspected) exposure to covid-19: Secondary | ICD-10-CM | POA: Insufficient documentation

## 2020-03-07 LAB — CBC
HCT: 38 % (ref 36.0–46.0)
Hemoglobin: 12.3 g/dL (ref 12.0–15.0)
MCH: 28.7 pg (ref 26.0–34.0)
MCHC: 32.4 g/dL (ref 30.0–36.0)
MCV: 88.8 fL (ref 80.0–100.0)
Platelets: 196 10*3/uL (ref 150–400)
RBC: 4.28 MIL/uL (ref 3.87–5.11)
RDW: 14.3 % (ref 11.5–15.5)
WBC: 8.4 10*3/uL (ref 4.0–10.5)
nRBC: 0 % (ref 0.0–0.2)

## 2020-03-07 LAB — RESPIRATORY PANEL BY RT PCR (FLU A&B, COVID)
Influenza A by PCR: NEGATIVE
Influenza B by PCR: NEGATIVE
SARS Coronavirus 2 by RT PCR: NEGATIVE

## 2020-03-07 LAB — CREATININE, SERUM
Creatinine, Ser: 0.48 mg/dL (ref 0.44–1.00)
GFR, Estimated: >60 mL/min (ref >60)

## 2020-03-07 MED ORDER — ACETAMINOPHEN 650 MG RE SUPP: 650.0000 mg | Freq: Four times a day (QID) | RECTAL | Status: DC | PRN

## 2020-03-07 MED ORDER — CYCLOBENZAPRINE HCL 10 MG PO TABS
10.0000 mg | ORAL_TABLET | Freq: Once | ORAL | Status: AC
  Administered 2020-03-07: 10 mg via ORAL
  Filled 2020-03-07: qty 1

## 2020-03-07 MED ORDER — MORPHINE SULFATE (PF) 2 MG/ML IV SOLN: 2.0000 mg | INTRAVENOUS | Status: DC | PRN

## 2020-03-07 MED ORDER — MIDAZOLAM HCL 2 MG/2ML IJ SOLN
INTRAMUSCULAR | Status: AC | PRN
  Administered 2020-03-07: 1 mg via INTRAVENOUS
  Administered 2020-03-07: 0.5 mg via INTRAVENOUS

## 2020-03-07 MED ORDER — FENTANYL CITRATE (PF) 100 MCG/2ML IJ SOLN
INTRAMUSCULAR | Status: AC
  Filled 2020-03-07: qty 4

## 2020-03-07 MED ORDER — LUBIPROSTONE 8 MCG PO CAPS: 8.0000 ug | ORAL_CAPSULE | Freq: Two times a day (BID) | ORAL | Status: DC

## 2020-03-07 MED ORDER — ACETAMINOPHEN 325 MG PO TABS: 650.0000 mg | ORAL_TABLET | Freq: Four times a day (QID) | ORAL | Status: DC | PRN

## 2020-03-07 MED ORDER — ENOXAPARIN SODIUM 40 MG/0.4ML ~~LOC~~ SOLN
40.0000 mg | SUBCUTANEOUS | Status: DC
  Administered 2020-03-07: 40 mg via SUBCUTANEOUS
  Filled 2020-03-07: qty 0.4

## 2020-03-07 MED ORDER — HYDROCORTISONE NA SUCCINATE PF 250 MG IJ SOLR
200.0000 mg | Freq: Once | INTRAMUSCULAR | Status: DC
  Filled 2020-03-07: qty 200

## 2020-03-07 MED ORDER — FLUMAZENIL 0.5 MG/5ML IV SOLN
INTRAVENOUS | Status: AC
  Filled 2020-03-07: qty 5

## 2020-03-07 MED ORDER — FENTANYL CITRATE (PF) 100 MCG/2ML IJ SOLN
50.0000 ug | Freq: Once | INTRAMUSCULAR | Status: AC
  Administered 2020-03-07: 50 ug via INTRAVENOUS
  Filled 2020-03-07: qty 2

## 2020-03-07 MED ORDER — LIDOCAINE HCL 1 % IJ SOLN
INTRAMUSCULAR | Status: AC | PRN
  Administered 2020-03-07: 10 mL

## 2020-03-07 MED ORDER — CYCLOBENZAPRINE HCL 5 MG PO TABS: 5.0000 mg | ORAL_TABLET | Freq: Three times a day (TID) | ORAL | Status: DC | PRN

## 2020-03-07 MED ORDER — DIPHENHYDRAMINE HCL 50 MG/ML IJ SOLN: 50.0000 mg | Freq: Once | INTRAMUSCULAR | Status: DC

## 2020-03-07 MED ORDER — POLYETHYLENE GLYCOL 3350 17 G PO PACK: 17.0000 g | PACK | Freq: Every day | ORAL | Status: DC | PRN

## 2020-03-07 MED ORDER — SODIUM CHLORIDE 0.9 % IV SOLN
3.0000 g | Freq: Four times a day (QID) | INTRAVENOUS | Status: DC
  Administered 2020-03-07 – 2020-03-08 (×4): 3 g via INTRAVENOUS
  Filled 2020-03-07: qty 8
  Filled 2020-03-07: qty 3
  Filled 2020-03-07 (×3): qty 8

## 2020-03-07 MED ORDER — FENTANYL CITRATE (PF) 100 MCG/2ML IJ SOLN
INTRAMUSCULAR | Status: AC | PRN
Start: 2020-03-07 — End: 2020-03-07
  Administered 2020-03-07: 50 ug via INTRAVENOUS

## 2020-03-07 MED ORDER — NALOXONE HCL 0.4 MG/ML IJ SOLN
INTRAMUSCULAR | Status: AC
  Filled 2020-03-07: qty 1

## 2020-03-07 MED ORDER — SODIUM CHLORIDE 0.9% FLUSH
3.0000 mL | Freq: Two times a day (BID) | INTRAVENOUS | Status: DC
  Administered 2020-03-07 (×2): 3 mL via INTRAVENOUS

## 2020-03-07 MED ORDER — TECHNETIUM TO 99M ALBUMIN AGGREGATED
4.3500 | Freq: Once | INTRAVENOUS | Status: AC
  Administered 2020-03-07: 4.35 via INTRAVENOUS

## 2020-03-07 MED ORDER — HYDROCODONE-ACETAMINOPHEN 5-325 MG PO TABS: 1.0000 | ORAL_TABLET | ORAL | Status: DC | PRN

## 2020-03-07 MED ORDER — MIDAZOLAM HCL 2 MG/2ML IJ SOLN
INTRAMUSCULAR | Status: AC
  Filled 2020-03-07: qty 4

## 2020-03-07 MED ORDER — DIPHENHYDRAMINE HCL 25 MG PO CAPS: 50.0000 mg | ORAL_CAPSULE | Freq: Once | ORAL | Status: DC

## 2020-03-07 NOTE — ED Triage Notes (Signed)
Pt arrives with c/o shob. Pt reports being seen at [facility] prior to arrival and dx with an abnormal fluid collection in LLQ which may require admission. Pt came to [facility] ER since oncology MD and chemo for ovarian cancer is here. Pt requesting to hold off on EKG and blood work since they were just obtained.

## 2020-03-07 NOTE — Procedures (Signed)
Interventional Radiology Procedure Note  Procedure: US guided aspiration of small LLQ intra-abdominal fluid collection.  5 mL clear yellow serous fluid.   Complications: None  Estimated Blood Loss: None  Recommendations: - Return to ED - No drain placed as fluid appears simple   Signed,  Criselda Peaches, MD

## 2020-03-07 NOTE — Progress Notes (Signed)
Pharmacy Antibiotic Note  Misty Hopkins is a 79 y.o. female admitted on 03/07/2020 with IAI.  Pharmacy has been consulted for Unasyn dosing.  Plan: Unasyn 3 g iv q 6 hours  Will follow remotely for renal function and clinical course  Height: 5\' 4"  (162.6 cm) Weight: 69.4 kg (153 lb) IBW/kg (Calculated) : 54.7  Temp (24hrs), Avg:97.9 F (36.6 C), Min:97.9 F (36.6 C), Max:97.9 F (36.6 C)  Recent Labs  Lab 03/03/20 1205  WBC 9.1  CREATININE 0.70    Estimated Creatinine Clearance: 56.3 mL/min (by C-G formula based on SCr of 0.7 mg/dL).    Allergies  Allergen Reactions  . Ivp Dye [Iodinated Diagnostic Agents] Anaphylaxis    Anaphylaxis and hives  . Propoxyphene Anaphylaxis, Shortness Of Breath and Swelling    swollen all over  . Codeine Nausea Only and Other (See Comments)    Headaches.  Marland Kitchen Ultram [Tramadol Hcl] Hives    Thank you for allowing pharmacy to be a part of this patient's care.  Napoleon Form 03/07/2020 9:33 AM

## 2020-03-07 NOTE — H&P (Addendum)
History and Physical        Hospital Admission Note Date: 03/07/2020  Patient name: Misty Hopkins Medical record number: [id number] Date of birth: 01-Jun-1940 Age: 79 y.o. Gender: female  PCP: Allie Dimmer, MD  Patient coming from: home   Chief Complaint    Chief Complaint  Patient presents with  . 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 Alvy Bimler, cancer related pain, GERD who initially presented to [facility] with complaints of shortness of breath which woke her up from sleep last night.  Admits to associated central chest burning sensation and right-sided chest pain with inspiration. She has also been having LLQ abdominal pain which was noted on her 11/8 visit with Dr. Alvy Bimler.  Had nausea after her chemo treatment the other day and 1 episode of vomiting last night.  Admits to constipation from her pain regimen.  No complaints of new onset lower extremity swelling or pain and denies any fevers or any other complaints.  She is a non-smoker, no alcohol use.  No history of blood clots. CT abdomen pelvis without contrast from [facility] noted in care everywhere showed interval development of a 3.2 cm fluid collection in the LLQ concerning for a diverticular abscess. CXR at that facility was unremarkable. Initial plan was to be transferred to [facility] as she receives her care here at the cancer center but there were no beds available so the patient left the facility and came to the [facility] ED on her own accord.   ED Course: Afebrile and hemodynamically stable on room air. Notable labs from today at [facility] in care everywhere: CBC and BMP overall unremarkable, D-dimer 3.09, respiratory viral panel including COVID-19 negative. CTA chest to  rule out PE ordered however given her listed contrast allergy she was started on Benadryl and steroids.  VQ scan also ordered.  Oncology and IR were consulted by the E physician..  Vitals:   03/07/20 0915 03/07/20 0930  BP:    Pulse: 77 78  Resp: 19 (!) 21  Temp:    SpO2: 97% 97%     Review of Systems:  Review of Systems  All other systems reviewed and are negative.   Medical/Social/Family History   Past Medical History: Past Medical History:  Diagnosis Date  . Complication of anesthesia    Difficult to put to sleep  . Diverticulosis   . Family history of lung cancer   . Family history of lymphoma   . GERD (gastroesophageal reflux disease)   . ovarian ca dx'd 02/2018    Past Surgical History:  Procedure Laterality Date  . Bladder Lift  2000  . COLONOSCOPY    . DEBULKING N/A 03/21/2018   Procedure: RADICAL TUMOR DEBULKING;  Surgeon: Everitt Amber, MD;  Location: [facility];  Service: Gynecology;  Laterality: N/A;  . EYE SURGERY  2017   Cataracts (Bilateral)  . IR IMAGING GUIDED PORT INSERTION  06/19/2018  . LAPAROSCOPIC SALPINGO OOPHERECTOMY Bilateral 03/21/2018   Procedure: SALPINGO OOPHORECTOMY;  Surgeon: Everitt Amber, MD;  Location: [facility];  Service: Gynecology;  Laterality: Bilateral;  . LAPAROSCOPY N/A 03/06/2018   Procedure: LAPAROSCOPY DIAGNOSTIC OMENTAL BIOPSY;  Surgeon: Everitt Amber, MD;  Location: [facility];  Service: Gynecology;  Laterality: N/A;  . LAPAROTOMY N/A 03/21/2018   Procedure: EXPLORATORY LAPAROTOMY, INCISIONAL HERNIA REPAIR;  Surgeon: Everitt Amber, MD;  Location: [facility];  Service: Gynecology;  Laterality: N/A;  . OMENTECTOMY N/A 03/21/2018   Procedure: OMENTECTOMY;  Surgeon: Everitt Amber, MD;  Location: [facility];  Service: Gynecology;  Laterality: N/A;  . PARTIAL HYSTERECTOMY     40 years ago  . Rectum Lift  2001  . TONSILLECTOMY      Medications: Prior to Admission medications   Medication Sig Start Date End Date Taking? Authorizing  Provider  Biotin 1 MG CAPS Take 1 tablet by mouth daily.    [provider]  carboxymethylcellulose (REFRESH PLUS) 0.5 % SOLN Place 1 drop into both eyes 3 (three) times daily as needed (for dry eyes.).    [provider]  Cholecalciferol (VITAMIN D3 SUPER STRENGTH) 50 MCG (2000 UT) TABS Take 2,000 Units by mouth daily.    [provider]  lubiprostone (AMITIZA) 8 MCG capsule Take 1 capsule (8 mcg total) by mouth 2 (two) times daily with a meal. Pharmacy- d/c rx for linzess. Not covered well by insurance 10/15/19   Pyrtle, Lajuan Lines, MD  ondansetron (ZOFRAN) 8 MG tablet TAKE 1 TABLET (8 MG TOTAL) BY MOUTH EVERY 8 (EIGHT) HOURS AS NEEDED FOR REFRACTORY NAUSEA / VOMITING. START ON DAY 3 AFTER CHEMO. 02/05/20   [name], MD  oxyCODONE 10 MG TABS Take 1 tablet (10 mg total) by mouth every 4 (four) hours as needed for severe pain. 03/03/20   [name], MD  polyethylene glycol (MIRALAX / GLYCOLAX) 17 g packet Take 17 g by mouth daily as needed. Patient not taking: Reported on 10/25/2019    [provider]  predniSONE (DELTASONE) 50 MG tablet Take 1 tablet 13 hours, 7 hours and 1 hour prior to CT scan. 03/03/20   [name], MD  prochlorperazine (COMPAZINE) 10 MG tablet Take 1 tablet (10 mg total) by mouth every 6 (six) hours as needed (Nausea or vomiting). 01/22/20   [name], MD  triamcinolone cream (KENALOG) 0.5 % Apply 1 application topically 2 (two) times daily. Apply to eyelids/eyebrows 02/16/18   [provider]    Allergies:   Allergies  Allergen Reactions  . Ivp Dye [Iodinated Diagnostic Agents] Anaphylaxis    Anaphylaxis and hives  . Propoxyphene Anaphylaxis, Shortness Of Breath and Swelling    swollen all over  . Codeine Nausea Only and Other (See Comments)    Headaches.  Marland Kitchen Ultram [Tramadol Hcl] Hives    Social History:  reports that she has never smoked. She has never used smokeless tobacco. She reports that she does not drink alcohol and  does not use drugs.  Family History: Family History  Problem Relation Age of Onset  . Diabetes Mother   . Stroke Mother   . Non-Hodgkin's lymphoma Brother 74       exp to agent orange  . Heart disease Maternal Grandmother   . Lung cancer Maternal Grandfather   . Tuberculosis Paternal Grandmother      Objective   Physical Exam: Blood pressure 139/76, pulse 78, temperature 97.9 F (36.6 C), temperature source Oral, resp. rate (!) 21, height 5\' 4"  (1.626 m), weight 69.4 kg, SpO2 97 %.  Physical Exam Vitals and nursing note reviewed.  Constitutional:  General: She is not in acute distress.    Comments: Appears uncomfortable  Eyes:     Extraocular Movements: Extraocular movements intact.  Cardiovascular:     Rate and Rhythm: Normal rate and regular rhythm.  Pulmonary:     Effort: No respiratory distress.     Breath sounds: No wheezing or rales.     Comments: Taking short breaths due to right-sided chest pain on inspiration Chest:     Chest wall: No tenderness.  Abdominal:     Palpations: Abdomen is soft.     Tenderness: There is abdominal tenderness.  Musculoskeletal:     Right lower leg: No tenderness. No edema.     Left lower leg: No tenderness. No edema.  Skin:    General: Skin is warm.     Coloration: Skin is not cyanotic.  Neurological:     General: No focal deficit present.     Mental Status: She is alert.  Psychiatric:        Mood and Affect: Mood normal.        Behavior: Behavior normal.     LABS on Admission: I have personally reviewed all the labs and imaging below    Basic Metabolic Panel: Recent Labs  Lab 03/03/20 1205  NA 140  K 4.0  CL 107  CO2 27  GLUCOSE 96  BUN 12  CREATININE 0.70  CALCIUM 8.9   Liver Function Tests: Recent Labs  Lab 03/03/20 1205  AST 18  ALT 14  ALKPHOS 63  BILITOT 0.3  PROT 6.6  ALBUMIN 3.5   No results for input(s): LIPASE, AMYLASE in the last 168 hours. No results for input(s): AMMONIA in the last  168 hours. CBC: Recent Labs  Lab 03/03/20 1205  WBC 9.1  NEUTROABS 4.9  HGB 12.1  HCT 37.2  MCV 87.3  PLT 224   Cardiac Enzymes: No results for input(s): CKTOTAL, CKMB, CKMBINDEX, TROPONINI in the last 168 hours. BNP: Invalid input(s): POCBNP CBG: No results for input(s): GLUCAP in the last 168 hours.  Radiological Exams on Admission:  No results found.    EKG: Independently reviewed.    A & P   Principal Problem:   Atypical chest pain Active Problems:   Fallopian tube cancer, carcinoma, right (HCC)   Cancer associated pain   Shortness of breath   LLQ pain   1. Atypical chest pain with shortness of breath a. Most concerning for possible PE given her cancer and sudden onset with elevated D Dimer (3) b. Troponin negative x 2 in Care Everywhere without concerning EKG changes c. Afebrile and hemodynamically stable on room air d. CTA chest ordered to rule out PE with steroid and Benadryl premedication due to contrast allergy listed i. Addendum: Per ED MD, CTA chest changed to VQ scan per IR e. Will not start on empiric heparin at this time since her vitals are stable  2. LLQ fluid collection concerning for diverticular abscess a. CT in Care Everywhere: 2.4 x 3.2 by 2.7 cm loculated fluid collection adjacent to proximal sigmoid colon b. Will start on empiric Unasyn c. IR consulted d. Addendum: Patient was very upset that she did not receive a CT scan with contrast which was already ordered by her oncologist last week for her abdominal pain. She ended up cancelling this outpatient study as she thought she was getting it done here. I explained that we had the scan from [facility] without contrast which showed a fluid collection which was concerning for an  abscess which prompted the IR drainage to help discern if it is infectious or not and explained that contrast is not needed at this time, which is in fact better that she not be exposed to contrast. She was very upset and  insisted that I discuss with Dr. Alvy Bimler. I called Dr. Alvy Bimler who also agreed that she does not need a contrast study at this time given the above findings and will follow up with the patient in the office next week. She did not feel that a formal oncology consult was needed at this time. This is to be relayed to the patient and husband at bedside.  3. Right Fallopian Tube Cancer  a. Received carboplatin doxorubicin on 11/8 and follows with Dr. Alvy Bimler  4. Cancer related pain a. Continue pain regimen with bowel regimen  5. Right low back pain likely musculoskeletal a. Heating pad b. flexeril    DVT prophylaxis: Lovenox   Code Status: Prior  Diet: heart healthy Family Communication: Admission, patients condition and plan of care including tests being ordered have been discussed with the patient who indicates understanding and agrees with the plan and Code Status. Patient's family at bedside was updated  Disposition Plan: The appropriate patient status for this patient is INPATIENT. Inpatient status is judged to be reasonable and necessary in order to provide the required intensity of service to ensure the patient's safety. The patient's presenting symptoms, physical exam findings, and initial radiographic and laboratory data in the context of their chronic comorbidities is felt to place them at high risk for further clinical deterioration. Furthermore, it is not anticipated that the patient will be medically stable for discharge from the hospital within 2 midnights of admission. The following factors support the patient status of inpatient.   " The patient's presenting symptoms include chest pain, LLQ pain, shortness of breath. " The worrisome physical exam findings include chest pain on inspiration, LLQ pain . " The initial radiographic and laboratory data are worrisome because of elevated D dimer. " The chronic co-morbidities include cancer.   * I certify that at the point of admission it  is my clinical judgment that the patient will require inpatient hospital care spanning beyond 2 midnights from the point of admission due to high intensity of service, high risk for further deterioration and high frequency of surveillance required.*   Status is: Inpatient  Remains inpatient appropriate because:Ongoing active pain requiring inpatient pain management, IV treatments appropriate due to intensity of illness or inability to take PO and Inpatient level of care appropriate due to severity of illness   Dispo: The patient is from: Home              Anticipated d/c is to: Home              Anticipated d/c date is: 3 days              Patient currently is not medically stable to d/c.     Consultants  . Oncology . IR  Procedures  . none  Time Spent on Admission: 66 minutes    Harold Hedge, DO [facility]  03/07/2020, 9:38 AM

## 2020-03-07 NOTE — ED Notes (Signed)
Pt's husband noted to be at the nurses' station and directed back to room by this Probation officer.  Pt voiced concerns about not eating since 0400 yesterday morning.  This RN checked on food order.  Diet was not put in until 1402 this evening.  Secretary asked to order tray. Pt and husband updated.

## 2020-03-07 NOTE — ED Notes (Signed)
Provider at bedside. Pt requested to speak with him to address numerous questions regarding plan of care.

## 2020-03-07 NOTE — Consult Note (Signed)
Chief Complaint: LLQ abscess seen on OSH CT Abd Pelvis. Request is for intra abdominal aspiration possible drain placement.  Referring Physician(s): Dr. Hillard Danker  Supervising Physician: Jacqulynn Cadet  Patient Status: [facility] - ED  History of Present Illness: Misty Hopkins is a 79 y.o. female story of. GERD, diverticulosis, ovarian cancer. Patient presented to the ED at [facility] with [facility] and pleuritic chest pain. Per note from the ED the Patient was awaiting bed placement at this facility from [facility]  when she left and came to the ED at [facility] for the same complaint. Per documentation from [facility] Patient has an elevated D -dimer with all other labs are with WNL. CT abdomen pelvis from OSH shows a 3.2cm fluid collection in the LLQ.  Team is requesting a LLQ abscess drain placement.  LLQ pain described as "pulling" worse with movement. approximately 2 weeks ago. Patient reports pain with palpation to LLQ. But states that the pain self resolved after her last chemotherapy treatment. Patient report nausea that self resolved. Denies vomiting, diarrhea or fevers.   Past Medical History:  Diagnosis Date  . Complication of anesthesia    Difficult to put to sleep  . Diverticulosis   . Family history of lung cancer   . Family history of lymphoma   . GERD (gastroesophageal reflux disease)   . ovarian ca dx'd 02/2018    Past Surgical History:  Procedure Laterality Date  . Bladder Lift  2000  . COLONOSCOPY    . DEBULKING N/A 03/21/2018   Procedure: RADICAL TUMOR DEBULKING;  Surgeon: Everitt Amber, MD;  Location: [facility];  Service: Gynecology;  Laterality: N/A;  . EYE SURGERY  2017   Cataracts (Bilateral)  . IR IMAGING GUIDED PORT INSERTION  06/19/2018  . LAPAROSCOPIC SALPINGO OOPHERECTOMY Bilateral 03/21/2018   Procedure: SALPINGO OOPHORECTOMY;  Surgeon: Everitt Amber, MD;  Location: [facility];  Service: Gynecology;  Laterality: Bilateral;  . LAPAROSCOPY N/A 03/06/2018    Procedure: LAPAROSCOPY DIAGNOSTIC OMENTAL BIOPSY;  Surgeon: Everitt Amber, MD;  Location: [facility];  Service: Gynecology;  Laterality: N/A;  . LAPAROTOMY N/A 03/21/2018   Procedure: EXPLORATORY LAPAROTOMY, INCISIONAL HERNIA REPAIR;  Surgeon: Everitt Amber, MD;  Location: [facility];  Service: Gynecology;  Laterality: N/A;  . OMENTECTOMY N/A 03/21/2018   Procedure: OMENTECTOMY;  Surgeon: Everitt Amber, MD;  Location: [facility];  Service: Gynecology;  Laterality: N/A;  . PARTIAL HYSTERECTOMY     40 years ago  . Rectum Lift  2001  . TONSILLECTOMY      Allergies: Ivp dye [iodinated diagnostic agents], Propoxyphene, Codeine, and Ultram [tramadol hcl]  Medications: Prior to Admission medications   Medication Sig Start Date End Date Taking? Authorizing Provider  Biotin 1 MG CAPS Take 1 tablet by mouth daily.    [provider]  carboxymethylcellulose (REFRESH PLUS) 0.5 % SOLN Place 1 drop into both eyes 3 (three) times daily as needed (for dry eyes.).    [provider]  Cholecalciferol (VITAMIN D3 SUPER STRENGTH) 50 MCG (2000 UT) TABS Take 2,000 Units by mouth daily.    [provider]  lubiprostone (AMITIZA) 8 MCG capsule Take 1 capsule (8 mcg total) by mouth 2 (two) times daily with a meal. Pharmacy- d/c rx for linzess. Not covered well by insurance 10/15/19   Pyrtle, Lajuan Lines, MD  ondansetron (ZOFRAN) 8 MG tablet TAKE 1 TABLET (8 MG TOTAL) BY MOUTH EVERY 8 (EIGHT) HOURS AS NEEDED FOR REFRACTORY NAUSEA / VOMITING. START ON  DAY 3 AFTER CHEMO. 02/05/20   [name], MD  oxyCODONE 10 MG TABS Take 1 tablet (10 mg total) by mouth every 4 (four) hours as needed for severe pain. 03/03/20   [name], MD  polyethylene glycol (MIRALAX / GLYCOLAX) 17 g packet Take 17 g by mouth daily as needed. Patient not taking: Reported on 10/25/2019    [provider]  predniSONE (DELTASONE) 50 MG tablet Take 1 tablet 13 hours, 7 hours and 1 hour prior to CT scan. 03/03/20    [name], MD  prochlorperazine (COMPAZINE) 10 MG tablet Take 1 tablet (10 mg total) by mouth every 6 (six) hours as needed (Nausea or vomiting). 01/22/20   [name], MD  triamcinolone cream (KENALOG) 0.5 % Apply 1 application topically 2 (two) times daily. Apply to eyelids/eyebrows 02/16/18   [provider]     Family History  Problem Relation Age of Onset  . Diabetes Mother   . Stroke Mother   . Non-Hodgkin's lymphoma Brother 30       exp to agent orange  . Heart disease Maternal Grandmother   . Lung cancer Maternal Grandfather   . Tuberculosis Paternal Grandmother     Social History   Socioeconomic History  . Marital status: Married    Spouse name: Not on file  . Number of children: Not on file  . Years of education: Not on file  . Highest education level: Not on file  Occupational History  . Not on file  Tobacco Use  . Smoking status: Never Smoker  . Smokeless tobacco: Never Used  Vaping Use  . Vaping Use: Never used  Substance and Sexual Activity  . Alcohol use: Never  . Drug use: Never  . Sexual activity: Not on file  Other Topics Concern  . Not on file  Social History Narrative  . Not on file   Social Determinants of Health   Financial Resource Strain:   . Difficulty of Paying Living Expenses: Not on file  Food Insecurity:   . Worried About Charity fundraiser in the Last Year: Not on file  . Ran Out of Food in the Last Year: Not on file  Transportation Needs:   . Lack of Transportation (Medical): Not on file  . Lack of Transportation (Non-Medical): Not on file  Physical Activity:   . Days of Exercise per Week: Not on file  . Minutes of Exercise per Session: Not on file  Stress:   . Feeling of Stress : Not on file  Social Connections:   . Frequency of Communication with Friends and Family: Not on file  . Frequency of Social Gatherings with Friends and Family: Not on file  . Attends Religious Services: Not on file  . Active Member of  Clubs or Organizations: Not on file  . Attends Archivist Meetings: Not on file  . Marital Status: Not on file    Review of Systems: A 12 point ROS discussed and pertinent positives are indicated in the HPI above.  All other systems are negative.  Review of Systems  Constitutional: Negative for fatigue and fever.  HENT: Negative for congestion.   Respiratory: Positive for shortness of breath ( sudden onset of shob with radiation of "burning" sensation up to her sternum.  Worse with inspiration. ). Negative for cough.   Gastrointestinal: Negative for abdominal pain ( pain with palpation to LLQ), diarrhea, nausea ( self resolved) and vomiting.       LLQ pain  described as "pulling" worse with movement. approximately 2 weeks ago/ Patient state that it self resolved.     Vital Signs: BP (!) 134/93   Pulse 65   Temp 97.9 F (36.6 C) (Oral)   Resp 18   Ht 5\' 4"  (1.626 m)   Wt 153 lb (69.4 kg)   SpO2 94%   BMI 26.26 kg/m   Physical Exam Vitals and nursing note reviewed.  Constitutional:      Appearance: She is well-developed.  HENT:     Head: Normocephalic and atraumatic.  Eyes:     Conjunctiva/sclera: Conjunctivae normal.  Cardiovascular:     Rate and Rhythm: Normal rate and regular rhythm.  Pulmonary:     Effort: Pulmonary effort is normal.     Breath sounds: Normal breath sounds.  Musculoskeletal:        General: Normal range of motion.     Cervical back: Normal range of motion.  Skin:    General: Skin is warm.  Neurological:     Mental Status: She is alert and oriented to person, place, and time.     Imaging: No results found.  Labs:  CBC: Recent Labs    12/21/19 0856 01/17/20 1339 01/30/20 1134 03/03/20 1205  WBC 10.0 11.4* 10.1 9.1  HGB 13.3 13.8 13.5 12.1  HCT 41.4 41.9 42.1 37.2  PLT 180 219 197 224    COAGS: No results for input(s): INR, APTT in the last 8760 hours.  BMP: Recent Labs    08/23/19 1053 08/23/19 1053 10/26/19 1041  10/26/19 1041 12/21/19 0856 01/17/20 1339 01/30/20 1134 03/03/20 1205  NA 141   < > 144   < > 141 138 140 140  K 4.1   < > 4.6   < > 3.7 3.8 3.7 4.0  CL 106   < > 108   < > 107 104 107 107  CO2 24   < > 24   < > 26 27 29 27   GLUCOSE 156*   < > 88   < > 98 141* 150* 96  BUN 16   < > 20   < > 12 16 12 12   CALCIUM 9.6   < > 9.3   < > 9.6 10.0 9.5 8.9  CREATININE 0.73   < > 0.74   < > 0.66 0.73 0.74 0.70  GFRNONAA >60   < > >60   < > >60 >60 >60 >60  GFRAA >60  --  >60  --  >60 >60  --   --    < > = values in this interval not displayed.    LIVER FUNCTION TESTS: Recent Labs    12/21/19 0856 01/17/20 1339 01/30/20 1134 03/03/20 1205  BILITOT 0.6 0.3 0.5 0.3  AST 14* 17 16 18   ALT 11 17 16 14   ALKPHOS 66 71 64 63  PROT 6.8 7.7 7.0 6.6  ALBUMIN 3.8 4.0 3.7 3.5    Assessment and Plan:  79 y.o female outpatient (in the ED). History of. GERD, diverticulosis, ovarian cancer. Patient presented to the ED at [facility] with [facility] and pleuritic chest pain. Per note from the ED the Patient  was awaiting bed placement at this facility from when she left and came to the ED at [facility] for the same complaint. Per documentation from [facility] Patient has an elevated D -dimer with all other labs are with WNL. CT abdomen pelvis from OSH shows a 3.2cm fluid collection in the LLQ.  Team is requesting a LLQ abscess drain placement.   Allergies include contrast reaction anaphylaxis, codeine and ultram. D-Dimer 3.09. All other labs and medications are within acceptable parameters. Patient has been NPO since midnight.   Risks and benefits discussed with the patient including bleeding, infection, damage to adjacent structures, bowel perforation/fistula connection, and sepsis.  All of the patient's questions were answered, patient is agreeable to proceed. Consent signed and in IR control room.   Thank you for this interesting consult.  I greatly enjoyed meeting Janne Faulk Blanchard and look  forward to participating in their care.  A copy of this report was sent to the requesting provider on this date.  Electronically Signed: Jacqualine Mau, NP 03/07/2020, 9:10 AM   I spent a total of 40 Minutes    in face to face in clinical consultation, greater than 50% of which was counseling/coordinating care for LLQ abscess aspiration possible drain placement

## 2020-03-07 NOTE — ED Notes (Addendum)
ED TO INPATIENT HANDOFF REPORT  ED Nurse Name and Phone #: Fredonia Highland [phone]  S Name/Age/Gender Misty Hopkins 79 y.o. female Room/Bed: WA05/WA05  Code Status   Code Status: DNR  Home/SNF/Other Home Patient oriented to: self, place, time and situation Is this baseline? Yes   Triage Complete: Triage complete  Chief Complaint Shortness of breath [R06.02]  Triage Note Pt arrives with c/o shob. Pt reports being seen at [facility] prior to arrival and dx with an abnormal fluid collection in LLQ which may require admission. Pt came to [facility] ER since oncology MD and chemo for ovarian cancer is here. Pt requesting to hold off on EKG and blood work since they were just obtained.     Allergies Allergies  Allergen Reactions  . Ivp Dye [Iodinated Diagnostic Agents] Anaphylaxis    Anaphylaxis and hives  . Propoxyphene Anaphylaxis, Shortness Of Breath and Swelling    swollen all over  . Codeine Nausea Only and Other (See Comments)    Headaches.  Marland Kitchen Ultram [Tramadol Hcl] Hives    Level of Care/Admitting Diagnosis ED Disposition    ED Disposition Condition Comment   Admit  Hospital Area: [facility] [[id number]]  Level of Care: Telemetry [5]  Admit to tele based on following criteria: Monitor for Ischemic changes  May admit patient to [facility] or [facility] if equivalent level of care is available:: Yes  Covid Evaluation: Confirmed COVID Negative  Date Laboratory Confirmed COVID Negative: 03/07/2020  Diagnosis: Shortness of breath [786.05.ICD-9-CM]  Admitting Physician: Harold Hedge [[id number]]  Attending Physician: Harold Hedge [[id number]]  Estimated length of stay: past midnight tomorrow  Certification:: I certify this patient will need inpatient services for at least 2 midnights       B Medical/Surgery History Past Medical History:  Diagnosis Date  . Complication of anesthesia    Difficult to put to sleep  . Diverticulosis   . Family  history of lung cancer   . Family history of lymphoma   . GERD (gastroesophageal reflux disease)   . ovarian ca dx'd 02/2018   Past Surgical History:  Procedure Laterality Date  . Bladder Lift  2000  . COLONOSCOPY    . DEBULKING N/A 03/21/2018   Procedure: RADICAL TUMOR DEBULKING;  Surgeon: Everitt Amber, MD;  Location: [facility];  Service: Gynecology;  Laterality: N/A;  . EYE SURGERY  2017   Cataracts (Bilateral)  . IR IMAGING GUIDED PORT INSERTION  06/19/2018  . LAPAROSCOPIC SALPINGO OOPHERECTOMY Bilateral 03/21/2018   Procedure: SALPINGO OOPHORECTOMY;  Surgeon: Everitt Amber, MD;  Location: [facility];  Service: Gynecology;  Laterality: Bilateral;  . LAPAROSCOPY N/A 03/06/2018   Procedure: LAPAROSCOPY DIAGNOSTIC OMENTAL BIOPSY;  Surgeon: Everitt Amber, MD;  Location: [facility];  Service: Gynecology;  Laterality: N/A;  . LAPAROTOMY N/A 03/21/2018   Procedure: EXPLORATORY LAPAROTOMY, INCISIONAL HERNIA REPAIR;  Surgeon: Everitt Amber, MD;  Location[facility];  Service: Gynecology;  Laterality: N/A;  . OMENTECTOMY N/A 03/21/2018   Procedure: OMENTECTOMY;  Surgeon: Everitt Amber, MD;  Location[facility];  Service: Gynecology;  Laterality: N/A;  . PARTIAL HYSTERECTOMY     40 years ago  . Rectum Lift  2001  . TONSILLECTOMY       A IV Location/Drains/Wounds Patient Lines/Drains/Airways Status    Active Line/Drains/Airways    Name Placement date Placement time Site Days   Implanted Port 06/19/18 Right Chest 06/19/18  1339  Chest  627   Incision (Closed) 03/21/18 N/A  Other (Comment) 03/21/18  1353   717   Incision (Closed) 03/21/18 N/A Other (Comment) 03/21/18  1353   717   Incision - 3 Ports Abdomen Umbilicus Left;Lower Left;Upper 03/06/18  1330   732          Intake/Output Last 24 hours No intake or output data in the 24 hours ending 03/07/20 2012  Labs/Imaging Results for orders placed or performed during the hospital encounter of 03/07/20 (from the past 48 hour(s))  Body fluid  culture     Status: None (Preliminary result)   Collection Time: 03/07/20  1:01 PM   Specimen: Abdomen; Body Fluid  Result Value Ref Range   Specimen Description ABDOMEN    Special Requests NONE    Gram Stain      NO WBC SEEN NO ORGANISMS SEEN Gram Stain Report Called to,Read Back By and Verified With: K.CUENZA AT 1521 ON 03/07/20 BY N.THOMPSON Performed at [facility], [place] [address], [place], [place] [address]    Culture PENDING    Report Status PENDING   CBC     Status: None   Collection Time: 03/07/20  3:09 PM  Result Value Ref Range   WBC 8.4 4.0 - 10.5 K/uL   RBC 4.28 3.87 - 5.11 MIL/uL   Hemoglobin 12.3 12.0 - 15.0 g/dL   HCT 38.0 36 - 46 %   MCV 88.8 80.0 - 100.0 fL   MCH 28.7 26.0 - 34.0 pg   MCHC 32.4 30.0 - 36.0 g/dL   RDW 14.3 11.5 - 15.5 %   Platelets 196 150 - 400 K/uL   nRBC 0.0 0.0 - 0.2 %    Comment: Performed at [facility], [place] [address] [place], [place] [address]  Creatinine, serum     Status: None   Collection Time: 03/07/20  3:09 PM  Result Value Ref Range   Creatinine, Ser 0.48 0.44 - 1.00 mg/dL   GFR, Estimated >60 >60 mL/min    Comment: (NOTE) Calculated using the CKD-EPI Creatinine Equation (2021) Performed at [facility], [place] [address], [place], [place] [address]    NM Pulmonary Perfusion  Result Date: 03/07/2020 CLINICAL DATA:  Positive D-dimer and shortness of breath. EXAM: NUCLEAR MEDICINE PERFUSION LUNG SCAN TECHNIQUE: Perfusion images were obtained in multiple projections after intravenous injection of radiopharmaceutical. Views: Anterior, posterior, left lateral, right lateral, RPO, LPO, RAO, LAO RADIOPHARMACEUTICALS:  4.35 mCi Tc-21m MAA IV COMPARISON:  Chest radiograph March 07, 2020. FINDINGS: Radiotracer uptake is homogeneous and symmetric bilaterally. No perfusion defects evident. IMPRESSION: No perfusion defects evident. No findings indicative of pulmonary embolus.  Electronically Signed   By: Lowella Grip III M.D.   On: 03/07/2020 11:44   CT ASPIRATION  Result Date: 03/07/2020 INDICATION: 79 year old female with left lower quadrant fluid collection. She presents for aspiration, and if the fluid appears infected, drain placement. EXAM: CT-guided aspiration MEDICATIONS: None ANESTHESIA/SEDATION: Fentanyl 50 mcg IV; Versed 1.5 mg IV Moderate Sedation Time:  17 minutes The patient was continuously monitored during the procedure by the interventional radiology nurse under my direct supervision. COMPLICATIONS: None immediate. PROCEDURE: Informed written consent was obtained from the patient after a thorough discussion of the procedural risks, benefits and alternatives. All questions were addressed. Maximal Sterile Barrier Technique was utilized including caps, mask, sterile gowns, sterile gloves, sterile drape, hand hygiene and skin antiseptic. A timeout was performed prior to the initiation of the procedure. Axial CT imaging was performed and the left lower quadrant  fluid collections successfully identified. The overlying skin was sterilely prepped and draped in the standard fashion using chlorhexidine skin prep. Local anesthesia was attained by infiltration with 1% lidocaine. A small dermatotomy was made. Under intermittent CT guidance, an 18 gauge trocar needle was successfully advanced into the center of the fluid collection. Aspiration was performed yielding approximately 5 mL of simple straw-colored serous fluid. No purulence, no internal debris. Given the simple appearing nature of the fluid, the decision was made not to place a drain at this time. The needle was removed. Follow-up CT imaging demonstrates complete collapse of the small fluid pocket. IMPRESSION: 1. Aspiration of left lower quadrant fluid collection yields 5 mL simple straw-colored serous fluid. 2. As the fluid appears to be simple in nature, no drainage catheter was placed. 3. The aspirated fluid was  sent 4 body fluid culture to confirm its sterility. Electronically Signed   By: Jacqulynn Cadet M.D.   On: 03/07/2020 13:13    Pending Labs Unresulted Labs (From admission, onward)          Start     Ordered   03/14/20 0500  Creatinine, serum  (enoxaparin (LOVENOX)    CrCl >/= 30 ml/min)  Weekly,   R     Comments: while on enoxaparin therapy    03/07/20 1508   03/08/20 1610  Basic metabolic panel  Tomorrow morning,   R        03/07/20 1508   03/08/20 0500  CBC  Tomorrow morning,   R        03/07/20 1508   03/07/20 1906  Respiratory Panel by RT PCR (Flu A&B, Covid) - Nasopharyngeal Swab  Once,   STAT       Question Answer Comment  Is this test for diagnosis or screening Screening   Symptomatic for COVID-19 as defined by CDC No   Hospitalized for COVID-19 No   Admitted to ICU for COVID-19 No   Previously tested for COVID-19 No   Resident in a congregate (group) care setting No   Employed in healthcare setting No   Pregnant No   Has patient completed COVID vaccination(s) (2 doses of Pfizer/Moderna 1 dose of [employer]) Unknown      03/07/20 1906   Signed and Held  Body fluid culture  Once,   R       Comments: 5 mL clear yellow serous fluid aspirated from peritoneal cavity   Question Answer Comment  Are there also cytology or pathology orders on this specimen? No   Patient immune status Immunocompromised      Signed and Held          Vitals/Pain Today's Vitals   03/07/20 1800 03/07/20 1815 03/07/20 1830 03/07/20 1915  BP:  (!) 152/72  124/60  Pulse: 85 65 84 72  Resp: (!) 35 (!) 23 16 (!) 23  Temp:      TempSrc:      SpO2: 96% 96% 97% 92%  Weight:      Height:      PainSc:        Isolation Precautions No active isolations  Medications Medications  acetaminophen (TYLENOL) tablet 650 mg (has no administration in time range)    Or  acetaminophen (TYLENOL) suppository 650 mg (has no administration in time range)  HYDROcodone-acetaminophen  (NORCO/VICODIN) 5-325 MG per tablet 1-2 tablet (has no administration in time range)  morphine 2 MG/ML injection 2 mg (has no administration in time range)  polyethylene glycol (MIRALAX /  GLYCOLAX) packet 17 g (has no administration in time range)  enoxaparin (LOVENOX) injection 40 mg (40 mg Subcutaneous Given 03/07/20 1856)  sodium chloride flush (NS) 0.9 % injection 3 mL (3 mLs Intravenous Given 03/07/20 1553)  Ampicillin-Sulbactam (UNASYN) 3 g in sodium chloride 0.9 % 100 mL IVPB (3 g Intravenous New Bag/Given 03/07/20 1541)  midazolam (VERSED) 2 MG/2ML injection (has no administration in time range)  fentaNYL (SUBLIMAZE) 100 MCG/2ML injection (has no administration in time range)  cyclobenzaprine (FLEXERIL) tablet 5 mg (has no administration in time range)  fentaNYL (SUBLIMAZE) injection 50 mcg (50 mcg Intravenous Given 03/07/20 0947)  technetium albumin aggregated (MAA) injection solution 9.02 millicurie (4.09 millicuries Intravenous Contrast Given 03/07/20 1110)  lidocaine (XYLOCAINE) 1 % (with pres) injection (10 mLs  Given 03/07/20 1209)  midazolam (VERSED) injection (0.5 mg Intravenous Given 03/07/20 1239)  fentaNYL (SUBLIMAZE) injection (50 mcg Intravenous Given 03/07/20 1232)  cyclobenzaprine (FLEXERIL) tablet 10 mg (10 mg Oral Given 03/07/20 1640)    Mobility walks with person assist Low fall risk   Focused Assessments Cardiac Assessment Handoff:  Cardiac Rhythm: Normal sinus rhythm No results found for: CKTOTAL, CKMB, CKMBINDEX, TROPONINI No results found for: DDIMER Does the Patient currently have chest pain? No      R Recommendations: See Admitting Provider Note  Report given to: [facility] RN  Additional Notes:

## 2020-03-07 NOTE — ED Notes (Signed)
Hourly rounding complete. Pt resting comfortably with bed in lowest position and side rails up x2. Vitals updated. All needs met at this time. Will continue to monitor. 

## 2020-03-07 NOTE — ED Notes (Signed)
Per Ovid Curd from laboratory, no specimen seen in pt's gram stain. Provider notified.

## 2020-03-07 NOTE — ED Provider Notes (Addendum)
[facility] DEPT Provider Note   CSN: [id number] Arrival date & time: 03/07/20  0430     History Chief Complaint  Patient presents with  . Shortness of Breath    Misty Hopkins is a 79 y.o. female.  HPI   The patient presents to the ED this morning after being evaluated at [facility] for the same complaint. Sounds like they tried to arrange for admission as the patient receives all her care here at the cancer center but there were no beds available. It sounds like patient decided to leave that facility instead of being admitted and came to the ED here. Patient states last evening she started having some difficulty catching her breath and chest discomfort. It was a burning discomfort in the center of her chest. She also has been having sharp pain more on the right side of her chest. It makes her try to catch her breath. Patient has been having that issue at least for few days. Also started having increasing pain in her abdomen. More in the left lower side. Patient is not having any vomiting or diarrhea. No fevers or chills. 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 According to the records the patient has been having episodes of intense abdominal pain. Patient has been receiving chemotherapy. Her last infusion was on November 8. She receive doxorubicin and carboplatin  Notes from the ED visit at [facility] are not available however the labs and imaging tests are. Patient had essentially normal laboratory tests with the exception of an elevated D-dimer. Patient also had a CT abdomen pelvis without contrast that showed evidence of a 3.2 cm fluid collection in the left lower quadrant. The patient does have a contrast allergy and I suspect that is why she did not have any imaging regarding the elevated D-dimer.  Past Medical History:  Diagnosis Date  . Complication of anesthesia    Difficult to put to sleep  . Diverticulosis   .  Family history of lung cancer   . Family history of lymphoma   . GERD (gastroesophageal reflux disease)   . ovarian ca dx'd 02/2018    Patient Active Problem List   Diagnosis Date Noted  . Shortness of breath 03/07/2020  . Cancer associated pain 02/04/2020  . Encounter for antineoplastic chemotherapy 01/22/2020  . Goals of care, counseling/discussion 01/21/2020  . Diverticulosis of colon 06/12/2019  . Left ulnar fracture 12/06/2018  . Acute back pain with sciatica 10/26/2018  . Arthralgia 08/28/2018  . Elevated BP without diagnosis of hypertension 08/28/2018  . Genetic testing 06/15/2018  . Other constipation 06/08/2018  . 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 Family history of lymphoma   . Family history of lung cancer   . 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 Ovarian cancer ([place]) 03/21/2018  . Omental mass 03/01/2018    Past Surgical History:  Procedure Laterality Date  . Bladder Lift  2000  . COLONOSCOPY    . DEBULKING N/A 03/21/2018   Procedure: RADICAL TUMOR DEBULKING;  Surgeon: Everitt Amber, MD;  Location: [facility];  Service: Gynecology;  Laterality: N/A;  . EYE SURGERY  2017   Cataracts (Bilateral)  . IR IMAGING GUIDED PORT INSERTION  06/19/2018  . LAPAROSCOPIC SALPINGO OOPHERECTOMY Bilateral 03/21/2018   Procedure: SALPINGO OOPHORECTOMY;  Surgeon: Everitt Amber, MD;  Location[facility];  Service: Gynecology;  Laterality: Bilateral;  . LAPAROSCOPY N/A 03/06/2018   Procedure: LAPAROSCOPY DIAGNOSTIC OMENTAL BIOPSY;  Surgeon: Everitt Amber, MD;  Location: [facility];  Service: Gynecology;  Laterality: N/A;  . LAPAROTOMY N/A 03/21/2018   Procedure: EXPLORATORY LAPAROTOMY, INCISIONAL HERNIA REPAIR;  Surgeon: Everitt Amber, MD;  [facility];  Service: Gynecology;  Laterality: N/A;  . OMENTECTOMY N/A 03/21/2018   Procedure: OMENTECTOMY;  Surgeon: Everitt Amber, MD;  [facility];  Service: Gynecology;  Laterality:  N/A;  . PARTIAL HYSTERECTOMY     40 years ago  . Rectum Lift  2001  . TONSILLECTOMY       OB History   No obstetric history on file.     Family History  Problem Relation Age of Onset  . Diabetes Mother   . Stroke Mother   . Non-Hodgkin's lymphoma Brother 40       exp to agent orange  . Heart disease Maternal Grandmother   . Lung cancer Maternal Grandfather   . Tuberculosis Paternal Grandmother     Social History   Tobacco Use  . Smoking status: Never Smoker  . Smokeless tobacco: Never Used  Vaping Use  . Vaping Use: Never used  Substance Use Topics  . Alcohol use: Never  . Drug use: Never    Home Medications Prior to Admission medications   Medication Sig Start Date End Date Taking? Authorizing Provider  Biotin 1 MG CAPS Take 1 tablet by mouth daily.    [provider]  carboxymethylcellulose (REFRESH PLUS) 0.5 % SOLN Place 1 drop into both eyes 3 (three) times daily as needed (for dry eyes.).    [provider]  Cholecalciferol (VITAMIN D3 SUPER STRENGTH) 50 MCG (2000 UT) TABS Take 2,000 Units by mouth daily.    [provider]  lubiprostone (AMITIZA) 8 MCG capsule Take 1 capsule (8 mcg total) by mouth 2 (two) times daily with a meal. Pharmacy- d/c rx for linzess. Not covered well by insurance 10/15/19   Pyrtle, Lajuan Lines, MD  ondansetron (ZOFRAN) 8 MG tablet TAKE 1 TABLET (8 MG TOTAL) BY MOUTH EVERY 8 (EIGHT) HOURS AS NEEDED FOR REFRACTORY NAUSEA / VOMITING. START ON DAY 3 AFTER CHEMO. 02/05/20   [name], MD  oxyCODONE 10 MG TABS Take 1 tablet (10 mg total) by mouth every 4 (four) hours as needed for severe pain. 03/03/20   [name], MD  polyethylene glycol (MIRALAX / GLYCOLAX) 17 g packet Take 17 g by mouth daily as needed. Patient not taking: Reported on 10/25/2019    [provider]  predniSONE (DELTASONE) 50 MG tablet Take 1 tablet 13 hours, 7 hours and 1 hour prior to CT scan. 03/03/20   [name], MD  prochlorperazine  (COMPAZINE) 10 MG tablet Take 1 tablet (10 mg total) by mouth every 6 (six) hours as needed (Nausea or vomiting). 01/22/20   [name], MD  triamcinolone cream (KENALOG) 0.5 % Apply 1 application topically 2 (two) times daily. Apply to eyelids/eyebrows 02/16/18   [provider]    Allergies    Ivp dye [iodinated diagnostic agents], Propoxyphene, Codeine, and Ultram [tramadol hcl]  Review of Systems   Review of Systems  All other systems reviewed and are negative.   Physical Exam Updated Vital Signs BP (!) 134/93   Pulse 65   Temp 97.9 F (36.6 C) (Oral)   Resp 18   Ht 1.626 m (5\' 4" )   Wt 69.4 kg   SpO2 94%   BMI 26.26 kg/m   Physical Exam Vitals and nursing note reviewed.  Constitutional:  General: She is in acute distress.     Appearance: She is well-developed. She is not diaphoretic.  HENT:     Head: Normocephalic and atraumatic.     Right Ear: External ear normal.     Left Ear: External ear normal.  Eyes:     General: No scleral icterus.       Right eye: No discharge.        Left eye: No discharge.     Conjunctiva/sclera: Conjunctivae normal.  Neck:     Trachea: No tracheal deviation.  Cardiovascular:     Rate and Rhythm: Normal rate and regular rhythm.  Pulmonary:     Effort: Pulmonary effort is normal. No respiratory distress.     Breath sounds: Normal breath sounds. No stridor. No wheezing or rales.  Abdominal:     General: Bowel sounds are normal. There is no distension.     Palpations: Abdomen is soft.     Tenderness: There is no abdominal tenderness. There is no guarding or rebound.     Comments: ttp llq  Musculoskeletal:        General: No tenderness.     Cervical back: Neck supple.  Skin:    General: Skin is warm and dry.     Findings: No rash.  Neurological:     Mental Status: She is alert.     Cranial Nerves: No cranial nerve deficit (no facial droop, extraocular movements intact, no slurred speech).     Sensory: No sensory  deficit.     Motor: No abnormal muscle tone or seizure activity.     Coordination: Coordination normal.     ED Results / Procedures / Treatments   Labs (all labs ordered are listed, but only abnormal results are displayed) Labs Reviewed - No data to display CBC normal at [facility], trop normal, cmet unremarkable, d dimer 3, gc and chlamydia neg, viral panel, covid neg, CXR negative EKG None  Radiology No results found.  CT scan at [facility] Multiple serosal implants again noted within the pelvis. However,  increasing peritoneal nodularity and peritoneal thickening noted  within the flanks and right upper quadrant as well as developing  trace ascites within the mesentery all suspicious for progressive  metastatic disease. Correlation with tumor markers may be helpful  for further management.   Interval development of a 3.2 cm fluid collection within the left  lower quadrant. Given its relatively rapid development, and  alternative etiologysuch as a diverticular abscess should be  considered. 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 This  collection should be amenable to CT-guided sampling, if clinically  desired.   Procedures Procedures (including critical care time)  Medications Ordered in ED Medications  fentaNYL (SUBLIMAZE) injection 50 mcg (has no administration in time range)    ED Course  I have reviewed the triage vital signs and the nursing notes.  Pertinent labs & imaging results that were available during my care of the patient were reviewed by me and considered in my medical decision making (see chart for details).  Clinical Course as of Mar 07 902  Fri Mar 07, 2020  4580 Discussed with Dr Humberto Seals.  He will let Dr Alvy Bimler know.   [JK]  [id number] Discussed with radiology.  They recommend vq scan and not the emergency contrast allergy protocol   [JK]  0901 Discussed with Dr Geroge Baseman.   Will proceed with iR intervention..  WIll keep pt npo   [  JK]    Clinical Course User Index [JK] Dorie Rank, MD   MDM Rules/Calculators/A&P                          Patient presented to the ED after being evaluated another emergency room.  Patient decided to not be admitted there and drive herself to the hospital here because all her care is at the [facility] health system.  Sounds like there may have been some attempt to arrange transfer but we did not have any beds available.  I was able to review all of the patient's laboratory tests as well as a CT scan but no notes are available  Patient's been having lower abdominal pain.  She did have a CT scan that shows a new fluid collection.  Concerns are this could be related to a diverticular abscess although it is possible this is related to her known fallopian tube carcinoma.  Patient will need CT-guided drainage.  Currently the patient is afebrile and does not have a leukocytosis.  We will hold off on antibiotics at this time.  Patient also had chest pain and had an elevated D-dimer.  She is at risk for pulmonary embolism.  Patient has a contrast allergy and therefore did not get a CT scan at the other facility.  Options at this time would be a VQ scan versus emergency contrast allergy protocol.  I have placed orders for the contrast allergy protocol.  We will also plan on CT scan of her chest.  I will consult the medical service for admission and further treatment.   Final Clinical Impression(s) / ED Diagnoses Final diagnoses:  Abdominal fluid collection  Positive D dimer  Fallopian tube cancer, carcinoma, unspecified laterality ([place])      Dorie Rank, MD 03/07/20 7414    Dorie Rank, MD 03/07/20 [phone]

## 2020-03-08 DIAGNOSIS — R0789 Other chest pain: Secondary | ICD-10-CM | POA: Diagnosis not present

## 2020-03-08 DIAGNOSIS — R0602 Shortness of breath: Secondary | ICD-10-CM | POA: Diagnosis present

## 2020-03-08 LAB — BASIC METABOLIC PANEL
Anion gap: 9 (ref 5–15)
BUN: 13 mg/dL (ref 8–23)
CO2: 26 mmol/L (ref 22–32)
Calcium: 8.8 mg/dL — ABNORMAL LOW (ref 8.9–10.3)
Chloride: 103 mmol/L (ref 98–111)
Creatinine, Ser: 0.47 mg/dL (ref 0.44–1.00)
GFR, Estimated: >60 mL/min (ref >60)
Glucose, Bld: 102 mg/dL — ABNORMAL HIGH (ref 70–99)
Potassium: 4.3 mmol/L (ref 3.5–5.1)
Sodium: 138 mmol/L (ref 135–145)

## 2020-03-08 LAB — CBC
HCT: 41.1 % (ref 36.0–46.0)
Hemoglobin: 13.2 g/dL (ref 12.0–15.0)
MCH: 28.5 pg (ref 26.0–34.0)
MCHC: 32.1 g/dL (ref 30.0–36.0)
MCV: 88.8 fL (ref 80.0–100.0)
Platelets: 213 10*3/uL (ref 150–400)
RBC: 4.63 MIL/uL (ref 3.87–5.11)
RDW: 14.3 % (ref 11.5–15.5)
WBC: 7.2 10*3/uL (ref 4.0–10.5)
nRBC: 0 % (ref 0.0–0.2)

## 2020-03-08 MED ORDER — POLYETHYLENE GLYCOL 3350 17 G PO PACK
17.0000 g | PACK | Freq: Every day | ORAL | 0 refills | Status: DC
Start: 2020-03-08 — End: 2022-10-19

## 2020-03-08 MED ORDER — HEPARIN SOD (PORK) LOCK FLUSH 100 UNIT/ML IV SOLN
500.0000 [IU] | Freq: Once | INTRAVENOUS | Status: AC
  Administered 2020-03-08: 500 [IU] via INTRAVENOUS
  Filled 2020-03-08: qty 5

## 2020-03-08 MED ORDER — SENNOSIDES-DOCUSATE SODIUM 8.6-50 MG PO TABS: 1.0000 | ORAL_TABLET | Freq: Every day | ORAL | 0 refills | Status: DC

## 2020-03-08 NOTE — Progress Notes (Signed)
SATURATION QUALIFICATIONS: (This note is used to comply with regulatory documentation for home oxygen)  Patient Saturations on Room Air at Rest = 95%  Patient Saturations on Room Air while Ambulating = 99%  Patient Saturations on 0 Liters of oxygen while Ambulating = 99%  Please briefly explain why patient needs home oxygen:  No need for home oxygen as patient's saturation level is excellent while ambulating and talking.

## 2020-03-08 NOTE — Discharge Summary (Signed)
Discharge Summary  Misty Hopkins MBW:[id number] DOB: 1940-11-04  PCP: Allie Dimmer, MD  Admit date: 03/07/2020 Discharge date: 03/08/2020  Time spent:  76mins  Recommendations for Outpatient Follow-up:  1. F/u with PCP within a week  for hospital discharge follow up, repeat cbc/bmp at follow up  Discharge Diagnoses:  Active Hospital Problems   Diagnosis Date Noted  . Atypical chest pain 03/07/2020  . SOB (shortness of breath) 03/08/2020  . Shortness of breath 03/07/2020  . LLQ pain 03/07/2020  . Cancer associated pain 02/04/2020  . Fallopian tube cancer, carcinoma, right ([facility] [facility]) 04/07/2018    Resolved Hospital Problems  No resolved problems to display.    Discharge Condition: stable  Diet recommendation: heart healthy/carb modified  Filed Weights   03/07/20 0436 03/07/20 2107  Weight: 69.4 kg 70.7 kg    History of present illness:   (Per admitting MD Dr. 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 Alvy Bimler, cancer related pain, GERD who initially presented to [facility] with complaints of shortness of breath which woke her up from sleep last night.  Admits to associated central chest burning sensation and right-sided chest pain with inspiration. She has also been having LLQ abdominal pain which was noted on her 11/8 visit with Dr. Alvy Bimler.  Had nausea after her chemo treatment the other day and 1 episode of vomiting last night.  Admits to constipation from her pain regimen.  No complaints of new onset lower extremity swelling or pain and denies any fevers or any other complaints.  She is a non-smoker, no alcohol use.  No history of blood clots. CT abdomen pelvis without contrast from [facility] noted in care everywhere showed interval development of a 3.2 cm fluid collection in the LLQ concerning for a diverticular abscess. CXR at that  facility was unremarkable. Initial plan was to be transferred to [facility] as she receives her care here at the cancer center but there were no beds available so the patient left the facility and came to the [facility] ED on her own accord.   ED Course: Afebrile and hemodynamically stable on room air. Notable labs from today at [facility] in care everywhere: CBC and BMP overall unremarkable, D-dimer 3.09, respiratory viral panel including COVID-19 negative. CTA chest to rule out PE ordered however given her listed contrast allergy she was started on Benadryl and steroids.  VQ scan also ordered.  Oncology and IR were consulted by the E physician..    Underwent IR aspiration of small LLQ intra-abdominal fluid collection.  5 mL clear yellow serous fluid   Hospital Course:  Principal Problem:   Atypical chest pain Active Problems:   Fallopian tube cancer, carcinoma, right ([facility])   Cancer associated pain   Shortness of breath   LLQ pain   SOB (shortness of breath)    Atypical chest pain with short of breath -SARS-CoV-2 screening negative -EKG no acute changes, recent echocardiogram unremarkable -Troponin negative x 2 in Care Everywhere  -VQ scan "No perfusion defects evident. No findings indicative of pulmonary embolus -Symptom resolved -unclear etiology, may be acid reflux/esophageal spasm --No hypoxia, no cough, lungs clear on exam ,she ambulated without issue , report back to baseline , she desired to go home , recommend outpatient follow-up  Right sided chest wall tenderness, no erythema, no edema, no skin lesions Possible musculoskeletal  Left lower quadrant fluid collection -Status post aspiration by  IR, fluid is not purulent, no WBC seen, no organisms seen, fluids culture no growth so far -she was treated with Zosyn initially on admission for possible diverticulitis , fluid is clear , nonpurulent , no organisms seen , culture no growth ,she has no fever, no leukocytosis, no abdominal pain, antibiotics  stopped -Follow-up on cytology result, she has an appointment with oncology Dr. Alvy Bimler on November 16  Right Fallopian tube cancer, cancer related pain -Continue home regimen for analgesics, follow-up with oncology    Procedures:  IR aspiration of small LLQ intra-abdominal fluid collection.  5 mL clear yellow serous fluid  Consultations:  IR  Discharge Exam: BP 113/77 (BP Location: Right Arm)   Pulse (!) 104   Temp 97.9 F (36.6 C) (Oral)   Resp 17   Ht 5\' 4"  (1.626 m)   Wt 70.7 kg   SpO2 97%   BMI 26.74 kg/m   General: NAD, very pleasant Cardiovascular: RRR Respiratory: Clear to auscultation bilaterally  Discharge Instructions You were cared for by a hospitalist during your hospital stay. If you have any questions about your discharge medications or the care you received while you were in the hospital after you are discharged, you can call the unit and asked to speak with the hospitalist on call if the hospitalist that took care of you is not available. Once you are discharged, your primary care physician will handle any further medical issues. Please note that NO REFILLS for any discharge medications will be authorized once you are discharged, as it is imperative that you return to your primary care physician (or establish a relationship with a primary care physician if you do not have one) for your aftercare needs so that they can reassess your need for medications and monitor your lab values.  Discharge Instructions    Diet general   Complete by: As directed    Increase activity slowly   Complete by: As directed      Allergies as of 03/08/2020      Reactions   Ivp Dye [iodinated Diagnostic Agents] Anaphylaxis   Anaphylaxis and hives   Propoxyphene Anaphylaxis, Shortness Of Breath, Swelling   swollen all over   Codeine Nausea Only, Other (See Comments)   Headaches.   Ultram [tramadol Hcl] Hives      Medication List    STOP taking these medications    lubiprostone 8 MCG capsule Commonly known as: Amitiza   predniSONE 50 MG tablet Commonly known as: DELTASONE     TAKE these medications   acetaminophen 325 MG tablet Commonly known as: TYLENOL Take 650 mg by mouth every 6 (six) hours as needed for mild pain, fever or headache.   Biotin 1 MG Caps Take 1 mg by mouth daily.   carboxymethylcellulose 0.5 % Soln Commonly known as: REFRESH PLUS Place 1 drop into both eyes 3 (three) times daily as needed (for dry eyes.).   lidocaine-prilocaine cream Commonly known as: EMLA Apply 1 application topically as needed. Port access   ondansetron 8 MG tablet Commonly known as: ZOFRAN TAKE 1 TABLET (8 MG TOTAL) BY MOUTH EVERY 8 (EIGHT) HOURS AS NEEDED FOR REFRACTORY NAUSEA / VOMITING. START ON DAY 3 AFTER CHEMO. What changed: See the new instructions.   Oxycodone HCl 10 MG Tabs Take 1 tablet (10 mg total) by mouth every 4 (four) hours as needed for severe pain.   polyethylene glycol 17 g packet Commonly known as: MIRALAX / GLYCOLAX Take 17 g by mouth daily.  prochlorperazine 10 MG tablet Commonly known as: COMPAZINE Take 1 tablet (10 mg total) by mouth every 6 (six) hours as needed (Nausea or vomiting).   senna-docusate 8.6-50 MG tablet Commonly known as: Senokot-S Take 1 tablet by mouth at bedtime.   VITAMIN C PO Take 1 tablet by mouth daily.   Vitamin D3 Super Strength 50 MCG (2000 UT) Tabs Generic drug: Cholecalciferol Take 2,000 Units by mouth daily.      Allergies  Allergen Reactions  . Ivp Dye [Iodinated Diagnostic Agents] Anaphylaxis    Anaphylaxis and hives  . Propoxyphene Anaphylaxis, Shortness Of Breath and Swelling    swollen all over  . Codeine Nausea Only and Other (See Comments)    Headaches.  Marland Kitchen Ultram [Tramadol Hcl] Hives      The results of significant diagnostics from this hospitalization (including imaging, microbiology, ancillary and laboratory) are listed below for reference.    Significant  Diagnostic Studies: NM Pulmonary Perfusion  Result Date: 03/07/2020 CLINICAL DATA:  Positive D-dimer and shortness of breath. EXAM: NUCLEAR MEDICINE PERFUSION LUNG SCAN TECHNIQUE: Perfusion images were obtained in multiple projections after intravenous injection of radiopharmaceutical. Views: Anterior, posterior, left lateral, right lateral, RPO, LPO, RAO, LAO RADIOPHARMACEUTICALS:  4.35 mCi Tc-34m MAA IV COMPARISON:  Chest radiograph March 07, 2020. FINDINGS: Radiotracer uptake is homogeneous and symmetric bilaterally. No perfusion defects evident. IMPRESSION: No perfusion defects evident. No findings indicative of pulmonary embolus. Electronically Signed   By: Lowella Grip III M.D.   On: 03/07/2020 11:44   CT ASPIRATION  Result Date: 03/07/2020 INDICATION: 79 year old female with left lower quadrant fluid collection. She presents for aspiration, and if the fluid appears infected, drain placement. EXAM: CT-guided aspiration MEDICATIONS: None ANESTHESIA/SEDATION: Fentanyl 50 mcg IV; Versed 1.5 mg IV Moderate Sedation Time:  17 minutes The patient was continuously monitored during the procedure by the interventional radiology nurse under my direct supervision. COMPLICATIONS: None immediate. PROCEDURE: Informed written consent was obtained from the patient after a thorough discussion of the procedural risks, benefits and alternatives. All questions were addressed. Maximal Sterile Barrier Technique was utilized including caps, mask, sterile gowns, sterile gloves, sterile drape, hand hygiene and skin antiseptic. A timeout was performed prior to the initiation of the procedure. Axial CT imaging was performed and the left lower quadrant fluid collections successfully identified. The overlying skin was sterilely prepped and draped in the standard fashion using chlorhexidine skin prep. Local anesthesia was attained by infiltration with 1% lidocaine. A small dermatotomy was made. Under intermittent CT  guidance, an 18 gauge trocar needle was successfully advanced into the center of the fluid collection. Aspiration was performed yielding approximately 5 mL of simple straw-colored serous fluid. No purulence, no internal debris. Given the simple appearing nature of the fluid, the decision was made not to place a drain at this time. The needle was removed. Follow-up CT imaging demonstrates complete collapse of the small fluid pocket. IMPRESSION: 1. Aspiration of left lower quadrant fluid collection yields 5 mL simple straw-colored serous fluid. 2. As the fluid appears to be simple in nature, no drainage catheter was placed. 3. The aspirated fluid was sent 4 body fluid culture to confirm its sterility. Electronically Signed   By: Jacqulynn Cadet M.D.   On: 03/07/2020 13:13    Microbiology: Recent Results (from the past 240 hour(s))  Body fluid culture     Status: None (Preliminary result)   Collection Time: 03/07/20  1:01 PM   Specimen: Abdomen; Body Fluid  Result Value Ref Range  Status   Specimen Description   Final    ABDOMEN Performed at [facility] [address], [place], [place] [address]    Special Requests   Final    NONE Performed at [facility], [place] [address], [place], [place] [address]    Gram Stain   Final    NO WBC SEEN NO ORGANISMS SEEN Gram Stain Report Called to,Read Back By and Verified With: K.CUENZA AT 1521 ON 03/07/20 BY N.THOMPSON Performed at [facility], [place] [address], [place], [place] [address]    Culture   Final    NO GROWTH < 24 HOURS Performed at [facility] [address] [place], [place] [address]    Report Status PENDING  Incomplete  Respiratory Panel by RT PCR (Flu A&B, Covid) - Nasopharyngeal Swab     Status: None   Collection Time: 03/07/20  7:06 PM   Specimen: Nasopharyngeal Swab  Result Value Ref Range Status   SARS Coronavirus 2 by RT PCR NEGATIVE NEGATIVE Final    Comment:  (NOTE) SARS-CoV-2 target nucleic acids are NOT DETECTED.  The SARS-CoV-2 RNA is generally detectable in upper respiratoy specimens during the acute phase of infection. The lowest concentration of SARS-CoV-2 viral copies this assay can detect is 131 copies/mL. A negative result does not preclude SARS-Cov-2 infection and should not be used as the sole basis for treatment or other patient management decisions. A negative result may occur with  improper specimen collection/handling, submission of specimen other than nasopharyngeal swab, presence of viral mutation(s) within the areas targeted by this assay, and inadequate number of viral copies (<131 copies/mL). A negative result must be combined with clinical observations, patient history, and epidemiological information. The expected result is Negative.  Fact Sheet for Patients:  PinkCheek.be  Fact Sheet for Healthcare Providers:  GravelBags.it  This test is no t yet approved or cleared by the [place] FDA and  has been authorized for detection and/or diagnosis of SARS-CoV-2 by FDA under an Emergency Use Authorization (EUA). This EUA will remain  in effect (meaning this test can be used) for the duration of the COVID-19 declaration under Section 564(b)(1) of the Act, 21 U.S.C. section 360bbb-3(b)(1), unless the authorization is terminated or revoked sooner.     Influenza A by PCR NEGATIVE NEGATIVE Final   Influenza B by PCR NEGATIVE NEGATIVE Final    Comment: (NOTE) The Xpert Xpress SARS-CoV-2/FLU/RSV assay is intended as an aid in  the diagnosis of influenza from Nasopharyngeal swab specimens and  should not be used as a sole basis for treatment. Nasal washings and  aspirates are unacceptable for Xpert Xpress SARS-CoV-2/FLU/RSV  testing.  Fact Sheet for Patients: PinkCheek.be  Fact Sheet for Healthcare  Providers: GravelBags.it  This test is not yet approved or cleared by the [place] FDA and  has been authorized for detection and/or diagnosis of SARS-CoV-2 by  FDA under an Emergency Use Authorization (EUA). This EUA will remain  in effect (meaning this test can be used) for the duration of the  Covid-19 declaration under Section 564(b)(1) of the Act, 21  U.S.C. section 360bbb-3(b)(1), unless the authorization is  terminated or revoked. Performed at [facility], [place] [address], [place], [place] [address]      Labs: Basic Metabolic Panel: Recent Labs  Lab 03/03/20 1205 03/07/20 1509 03/08/20 0511  NA 140  --  138  K 4.0  --  4.3  CL 107  --  103  CO2 27  --  26  GLUCOSE 96  --  102*  BUN 12  --  13  CREATININE 0.70 0.48 0.47  CALCIUM 8.9  --  8.8*   Liver Function Tests: Recent Labs  Lab 03/03/20 1205  AST 18  ALT 14  ALKPHOS 63  BILITOT 0.3  PROT 6.6  ALBUMIN 3.5   No results for input(s): LIPASE, AMYLASE in the last 168 hours. No results for input(s): AMMONIA in the last 168 hours. CBC: Recent Labs  Lab 03/03/20 1205 03/07/20 1509 03/08/20 0511  WBC 9.1 8.4 7.2  NEUTROABS 4.9  --   --   HGB 12.1 12.3 13.2  HCT 37.2 38.0 41.1  MCV 87.3 88.8 88.8  PLT 224 196 213   Cardiac Enzymes: No results for input(s): CKTOTAL, CKMB, CKMBINDEX, TROPONINI in the last 168 hours. BNP: BNP (last 3 results) No results for input(s): BNP in the last 8760 hours.  ProBNP (last 3 results) No results for input(s): PROBNP in the last 8760 hours.  CBG: No results for input(s): GLUCAP in the last 168 hours.     Signed:  Florencia Reasons MD, PhD, FACP  [facility] 03/08/2020, 3:44 PM

## 2020-03-08 NOTE — Progress Notes (Signed)
Reviewed AVS with patient.  Answered all questions to her satisfaction.  Taken to front entrance and turned over to the care of her husband without any difficulty.   Patient alert and oriented, skin warm and dry.

## 2020-03-08 NOTE — Care Management Obs Status (Signed)
[place] NOTIFICATION   Patient Details  Name: Violetta Lavalle MRN: [id number] Date of Birth: 28-Sep-1940   Medicare Observation Status Notification Given:  Yes    Joaquin Courts, RN 03/08/2020, 1:29 PM

## 2020-03-08 NOTE — Care Management CC44 (Signed)
Condition Code 44 Documentation Completed  Patient Details  Name: Chrishauna Mee MRN: [id number] Date of Birth: [birth year]   Condition Code 44 given:  Yes Patient signature on Condition Code 44 notice:  Yes Documentation of 2 MD's agreement:  Yes Code 44 added to claim:  Yes    Joaquin Courts, RN 03/08/2020, 1:29 PM

## 2020-03-10 ENCOUNTER — Telehealth: Payer: Self-pay | Admitting: Oncology

## 2020-03-10 ENCOUNTER — Ambulatory Visit
Admission: RE | Admit: 2020-03-10 | Discharge: 2020-03-10 | Disposition: A | Discharge status: Home / Self Care | Payer: Self-pay | Source: Ambulatory Visit | Attending: Hematology and Oncology | Admitting: Hematology and Oncology

## 2020-03-10 ENCOUNTER — Ambulatory Visit: Admission: RE | Admit: 2020-03-10 | Payer: Medicare HMO | Source: Ambulatory Visit

## 2020-03-10 DIAGNOSIS — C5701 Malignant neoplasm of right fallopian tube: Secondary | ICD-10-CM

## 2020-03-10 NOTE — Telephone Encounter (Signed)
Mr. Funk called back and said Agueda is alert and out of bed an is planning on attending her appointment tomorrow.  Confirmed appointment time with him.

## 2020-03-10 NOTE — Telephone Encounter (Signed)
Eddie Dibbles (husband) called and said Misty Hopkins was having shortness of breath so they went to [facility] ER and were sent to the ER at [facility].  She had fluid drained and was sent home on Saturday.  He said she had pain again in her lower abdomen on Sunday and had nausea/vomiting last night.  She did take an antiemetic but threw it up. She was finally able to sleep at midnight.    Advised him that Kameria should keep her appointment tomorrow with Dr. Alvy Bimler.  He said he is not sure she will feel good enough to go but will give [place] an update this afternoon.

## 2020-03-11 ENCOUNTER — Encounter: Payer: Self-pay | Admitting: Hematology and Oncology

## 2020-03-11 ENCOUNTER — Telehealth: Payer: Self-pay | Admitting: Oncology

## 2020-03-11 ENCOUNTER — Other Ambulatory Visit: Payer: Self-pay | Admitting: Hematology and Oncology

## 2020-03-11 ENCOUNTER — Other Ambulatory Visit: Payer: Self-pay

## 2020-03-11 ENCOUNTER — Inpatient Hospital Stay (HOSPITAL_BASED_OUTPATIENT_CLINIC_OR_DEPARTMENT_OTHER): Payer: Medicare HMO | Admitting: Hematology and Oncology

## 2020-03-11 DIAGNOSIS — C5701 Malignant neoplasm of right fallopian tube: Secondary | ICD-10-CM

## 2020-03-11 DIAGNOSIS — Z5111 Encounter for antineoplastic chemotherapy: Secondary | ICD-10-CM | POA: Diagnosis not present

## 2020-03-11 DIAGNOSIS — R0789 Other chest pain: Secondary | ICD-10-CM

## 2020-03-11 DIAGNOSIS — K5792 Diverticulitis of intestine, part unspecified, without perforation or abscess without bleeding: Secondary | ICD-10-CM | POA: Diagnosis not present

## 2020-03-11 LAB — BODY FLUID CULTURE
Culture: NO GROWTH
Gram Stain: NONE SEEN

## 2020-03-11 MED ORDER — AMOXICILLIN-POT CLAVULANATE 875-125 MG PO TABS: 1.0000 | ORAL_TABLET | Freq: Two times a day (BID) | ORAL | 0 refills | Status: DC

## 2020-03-11 MED FILL — AMOX-CLAV 875-125 MG TABLET: 875-125 | 7 days supply | Qty: 14 | Fill #0

## 2020-03-11 NOTE — Progress Notes (Signed)
[place] OFFICE PROGRESS NOTE  Patient Care Team: Allie Dimmer, MD as PCP - General (Internal Medicine) [name], RN as Oncology Nurse Navigator (Oncology)  ASSESSMENT & PLAN:  Fallopian tube cancer, carcinoma, right [facility] We will attempt to obtain outside CT imaging However, the outside CT was done without contrast and it is hard to interpret without contrast I recommend 1 week course of antibiotic therapy and order CT imaging with contrast for further evaluation of the disease status If indeed she had recent diverticulitis and free fluid in the abdomen as a result of infection, that could explain the elevation of her tumor marker and not necessary from disease progression She is in agreement with the plan of care to  Acute diverticulitis She underwent CT-guided drainage of the free fluid of the abdomen Culture was negative It is not clear whether the free fluid was due to cancer or recent infection Surprisingly, her pain went away The only difference in terms of management between last week and this week is a course of broad-spectrum IV antibiotics that she received in the hospital I suspect, if she does have acute diverticulitis, that might have caused temporary relief of her symptoms I recommend a course of Augmentin for her As above, I plan to repeat CT imaging with contrast for further evaluation  Atypical chest pain After very prolonged discussion about her atypical chest pain, it is clear that it is not due to PE, likely exacerbated by possible reflux or panic attack She have extensive evaluation for this I do not recommend further evaluation of this atypical chest pain Observe closely for now   No orders of the defined types were placed in this encounter.   All questions were answered. The patient knows to call the clinic with any problems, questions or concerns. The total time spent in the appointment was 30 minutes encounter with patients  including review of chart and various tests results, discussions about plan of care and coordination of care plan   [name], MD 03/11/2020 10:07 AM  INTERVAL HISTORY: Please see below for problem oriented charting. She returns today with her husband for further follow-up She was supposed to get CT imaging done with contrast yesterday and follow-up today to evaluate the cause of her severe abdominal pain Last Thursday, she woke up with significant chest pain and shortness of breath on deep inspiration She went to a local emergency department who told her she could not be evaluated fully due to limited resources. She had CT imaging of the abdomen without contrast which show some free fluid in the abdomen Imaging was not available. Report showed:  Multiple serosal implants again noted within the pelvis. However, increasing peritoneal nodularity and peritoneal thickening noted within the flanks and right upper quadrant as well as developing trace ascites within the mesentery all suspicious for progressive metastatic disease. Correlation with tumor markers may be helpful for further management.   Interval development of a 3.2 cm fluid collection within the left lower quadrant. Given its relatively rapid development, and alternative etiologysuch as a diverticular abscess should be considered. 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 This collection should be amenable to CT-guided sampling, if clinically desired.   She subsequently left the local emergency department and drove to [facility] emergency department for further evaluation Due to her contrast allergy, she had VQ scan performed which rule out pulmonary embolism She was briefly admitted to the hospital and  received broad-spectrum IV antibiotics Subsequently, her pain resolved and due to clear fluid seen on the aspiration, diverticular abscess became a less likely  diagnosis and she was discharged home with plan for follow-up today Surprisingly, her pain went away Her chest pain and shortness of breath also has resolved  SUMMARY OF ONCOLOGIC HISTORY: Oncology History Overview Note  High grade serous, right fallopian tube  HRD, BRCA tumor testing were neg   Ovarian cancer ([place])  03/21/2018 Initial Diagnosis   Ovarian cancer ([place])   02/04/2020 -  Chemotherapy   The patient had carboplatin and doxil for chemotherapy treatment.     Fallopian tube cancer, carcinoma, right ([place])  10/24/2017 Initial Diagnosis   She has presentation of vague abdominal pain   02/20/2018 Imaging   Outside CT abdomen and pelvis: This revealed an incidental finding of a 1.5 cm subcapsular lesion in the lateral upper pole of the left kidney which measured higher than fluid attenuation which could represent a proteinaceous renal cyst or solid renal mass.  There was no ascites.  There is no lymphadenopathy.  There were no masses in the pelvis including ovarian or adnexal masses seen.  There was however soft tissue stranding seen with nodularity in the omental fat in the lower abdomen without evidence of ascites.  This was felt to represent either carcinomatosis or inflammatory infectious etiologies.  There was colonic diverticulosis seen without radiographic evidence of diverticulitis.  The radiologist recommended MRI of the abdomen to further work-up the renal mass   02/22/2018 Tumor Marker   Patient's tumor was tested for the following markers: CA-125. Results of the tumor marker test revealed 103.   03/06/2018 Pathology Results   Omentum, biopsy - ADENOCARCINOMA WITH PSAMMOMA BODIES, SEE COMMENT. Microscopic Comment There are scattered small foci of malignant glands with associated psammoma bodies. While the tumor is somewhat limited the cells have a more low grade appearance. There is prominent lymphovascular invasion. Immunohistochemistry reveals the cells are positive for  cytokeratin 7, ER, MOC31, PAX8, and WT-1. They are negative for calretinin, cytokeratin 20, CDX-2, and TTF-1. Overall, the findings are consistent with adenocarcinoma. The differential includes a primary gynecologic or peritoneal tumor, although the morphology is not typical of a high grade serous carcinoma.   03/06/2018 Surgery   Surgeon: Donaciano Eva   Pre-operative Diagnosis: omental mass, elevated CA 125 Operation: Laparoscopic omentectomy  Surgeon: Donaciano Eva  Operative Findings:  : normal diaphragm and upper omentum. Sigmoid colon densely adherent to left pelvic side wall consistent with history of diverticulitis. Omentum adherent to anterior abdominal wall and sigmoid colon. Surgically absent uterus. Right ovary very small and grossly normal. Left ovary obscured by adhesed sigmoid colon. No ascites. No carcinomatosis. There was a nodular firm distal omentum on the left which was adherent to the sigmoid colon and most consistent with inflammatory change.      03/21/2018 Pathology Results   1. Soft tissue mass, simple excision, midline abdominal wall mass - METASTATIC SEROUS CARCINOMA. 2. Omentum, resection for tumor - METASTATIC SEROUS CARCINOMA. 3. Adnexa - ovary +/- tube, neoplastic, right - RIGHT FALLOPIAN TUBE: -HIGH GRADE SEROUS CARCINOMA. -SEE ONCOLOGY TABLE. - RIGHT OVARY: SURFACE PSAMMOMA BODIES. NO DEFINITIVE MALIGNANT CELLS. -INCLUSION CYSTS. 4. Adnexa - ovary +/- tube, neoplastic, left - LEFT FALLOPIAN TUBE: LUMINAL AND SURFACE SEROUS CARCINOMA. - LEFT OVARY: FOCAL PSAMMOMA BODIES AND MALIGNANT CELLS. 5. Soft tissue mass, simple excision, left abdominal wall - METASTATIC SEROUS CARCINOMA. Microscopic Comment 3. OVARY or FALLOPIAN TUBE or PRIMARY  PERITONEUM: Procedure: Bilateral salpingo-oophorectomy. Abdominal wall mass excision and omentectomy. Specimen Integrity: Intact. Tumor Site: Right fallopian tube. Ovarian Surface Involvement  (required only if applicable): Present (left). Fallopian Tube Surface Involvement (required only if applicable): Present. Tumor Size: 1.5 cm. Histologic Type: High grade serous carcinoma. Histologic Grade: High grade. Implants (required for advanced stage serous/seromucinous borderline tumors only): Present. Other Tissue/ Organ Involvement: Omentum, abdominal wall, left fallopian tube and ovary. Largest Extrapelvic Peritoneal Focus (required only if applicable): >2 cm. Peritoneal/Ascitic Fluid: N/A. Treatment Effect (required only for high-grade serous carcinomas): N/A. Regional Lymph Nodes: No lymph nodes submitted or found Pathologic Stage Classification (pTNM, AJCC 8th Edition): pT3c, pNX Representative Tumor Block: 3A Comment(s): None.   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   Surgeon: Thereasa Solo, MD. Specimens: Bilateral tubes / ovaries, omentum. Midline abdominal wall mass, left lateral abdominal wall mass.    Operative Findings:  Abdominal wall masses consistent with port site metastases at the umbilical incision (7cm) and left lateral incision (5cm). Omental caking in the infracolic omentum. Grossly normal very small tubes and ovaries. Normal diaphragms. No lymphadenopathy. Normal small and large intestine with the exception of miliary studding of tumor on the surface of the sigmoid colon and rectum in the cul de sac.    This represented an optimal cytoreduction (R1) with no gross visible disease remaining, however there was a thin rind of tumor on the lateral left fascia associated with the port site metastasis.     04/11/2018 Cancer Staging   Staging form: Ovary, Fallopian Tube, and Primary Peritoneal Carcinoma, AJCC 8th Edition - Pathologic: FIGO  Stage IIIC (pT3, pN0, cM0) - Signed by [name], MD on 04/11/2018   05/23/2018 Imaging   1. Interval resolution of the anterior midline abdominal wall seroma. There is some persistent wispy soft tissue attenuation in the region of the midline incision, nonspecific and may simply reflect granulation tissue from surgery. 2. Stable 13 mm exophytic lesion posterior left kidney with attenuation higher than expected for a simple cyst. This may be a cyst complicated by proteinaceous debris or hemorrhage, but attention on follow-up recommended. 3. Stable mild persistent distention of proximal jejunal loops with gradual tapering to nondilated distal small bowel. 4. No overt peritoneal or mesenteric disease identified on this exam.   05/23/2018 Tumor Marker   Patient's tumor was tested for the following markers: CA-125 Results of the tumor marker test revealed 11.6   06/02/2018 - 09/20/2018 Chemotherapy   The patient had carboplatin and taxol   06/19/2018 Procedure   Status post right IJ port catheter placement. Catheter ready for use.   06/26/2018 Tumor Marker   Patient's tumor was tested for the following markers: CA-125. Results of the tumor marker test revealed 10.7   09/20/2018 Tumor Marker   Patient's tumor was tested for the following markers: CA-125. Results of the tumor marker test revealed 9.1   10/25/2018 Imaging   1. There is suspicious, abnormal asymmetric increased soft tissue involving the cecum and ascending colon. This is suspicious for residual/progressive serosal involvement by tumor. 2. No additional sites of disease identified. No evidence for nodal metastasis, solid organ metastasis or ascites.     11/07/2018 Procedure   She had negative colonoscopy evaluation   12/05/2018 Tumor Marker   Patient's tumor was tested for the following markers: CA-125 Results of the  tumor marker test revealed 8.6   01/25/2019 Imaging   Ct abdomen and pelvis Signs of omentectomy and hysterectomy  without definitive signs of residual recurrent disease. Small nodular focus bridging rectum and vagina is stable dating back January to 12/14/2018, potentially postoperative but suggest attention on subsequent imaging.   01/25/2019 Tumor Marker   Patient's tumor was tested for the following markers: CA-125 Results of the tumor marker test revealed 10.3   03/15/2019 Tumor Marker   Patient's tumor was tested for the following markers: CA-125 Results of the tumor marker test revealed 9.6   03/23/2019 Imaging   No acute findings in the abdomen or pelvis.   Colonic diverticulosis.  No active diverticulitis.   Prior open tectum E and hysterectomy.   Aortic atherosclerosis.     05/10/2019 Tumor Marker   Patient's tumor was tested for the following markers: CA-125 Results of the tumor marker test revealed 11.8   05/22/2019 Imaging   1. Stable exam. No evidence of recurrent or metastatic carcinoma within the abdomen or pelvis. 2. Colonic diverticulosis, without radiographic evidence of diverticulitis.     11/08/2019 Imaging   1. Status post hysterectomy and bilateral oophorectomy. 2. Soft tissue thickening within the deep pelvis which when compared to multiple prior exams is felt to be slowly progressive. Especially given the elevated CA 125 level, this is suspicious for peritoneal recurrence. Potential imaging strategies include PET (which may be of low sensitivity secondary to the lack of well-defined dominant mass) or pelvic CT follow-up at 3 months. 3. Otherwise, no evidence of metastatic disease within the chest, abdomen, or pelvis. 4. Coronary artery atherosclerosis. Aortic Atherosclerosis (ICD10-I70.0). 5. Ventral abdominal wall hernia containing nonobstructive transverse colon, as before. 6.  Possible constipation.   12/21/2019 Tumor Marker   Patient's tumor was tested for the following markers: 95.2. Results of the tumor marker test revealed CA-`125.   01/17/2020 Imaging   1.  Similar appearance of nodularity in the central pelvis along the vaginal cuff. There is a particular nodular soft tissue density measuring 2.2 x 1.6 cm along the wall of the proximal rectum that is slightly more conspicuous than previously, raising concern for possible minimal progression. Similar appearance of the other areas of nodular soft tissue in the central pelvis. PET-CT may prove helpful to further evaluate. 2. No ascites. 3. Left colonic diverticulosis without diverticulitis. 4. 1.7 cm exophytic lesion upper pole left kidney with well-defined homogeneous attenuation slightly higher than would be expected for simple fluid. This is probably a complex cyst. Attention on follow-up recommended. 5. Aortic Atherosclerosis (ICD10-I70.0).   01/17/2020 Tumor Marker   Patient's tumor was tested for the following markers: CA-125 Results of the tumor marker test revealed 135   01/30/2020 Echocardiogram   1. Left ventricular ejection fraction, by estimation, is 55 to 60%. Left ventricular ejection fraction by PLAX is 55 %. The left ventricle has normal function. The left ventricle has no regional wall motion abnormalities. Left ventricular diastolic parameters are indeterminate.  2. Right ventricular systolic function is normal. The right ventricular size is normal.  3. Left atrial size was borderline dilated.  4. The mitral valve is grossly normal. Trivial mitral valve regurgitation.  5. The aortic valve was not well visualized. Aortic valve regurgitation is trivial.  6. The inferior vena cava is normal in size with greater than 50% respiratory variability, suggesting right atrial pressure of 3 mmHg.   02/04/2020 -  Chemotherapy   The patient had carboplatin and doxil for chemotherapy treatment.  03/03/2020 Tumor Marker   Patient's tumor was tested for the following markers: CA-125 Results of the tumor marker test revealed 272   03/07/2020 Procedure   1. Aspiration of left lower quadrant  fluid collection yields 5 mL simple straw-colored serous fluid. 2. As the fluid appears to be simple in nature, no drainage catheter was placed. 3. The aspirated fluid was sent 4 body fluid culture to confirm its sterility.   03/07/2020 Imaging   VQ scan No perfusion defects evident. No findings indicative of pulmonary embolus.   03/07/2020 Imaging   Outside CT Multiple serosal implants again noted within the pelvis. However, increasing peritoneal nodularity and peritoneal thickening noted within the flanks and right upper quadrant as well as developing trace ascites within the mesentery all suspicious for progressive metastatic disease. Correlation with tumor markers may be helpful for further management.   Interval development of a 3.2 cm fluid collection within the left lower quadrant. Given its relatively rapid development, and alternative etiology such as a diverticular abscess should be considered. Recurrent disease, particular given the patient's history of serous adenocarcinoma, is not definitively excluded     REVIEW OF SYSTEMS:   Constitutional: Denies fevers, chills or abnormal weight loss Eyes: Denies blurriness of vision Ears, nose, mouth, throat, and face: Denies mucositis or sore throat Skin: Denies abnormal skin rashes Lymphatics: Denies new lymphadenopathy or easy bruising Neurological:Denies numbness, tingling or new weaknesses Behavioral/Psych: Mood is stable, no new changes  All other systems were reviewed with the patient and are negative.  I have reviewed the past medical history, past surgical history, social history and family history with the patient and they are unchanged from previous note.  ALLERGIES:  is allergic to ivp dye [iodinated diagnostic agents], propoxyphene, codeine, and ultram [tramadol hcl].  MEDICATIONS:  Current Outpatient Medications  Medication Sig Dispense Refill  . acetaminophen (TYLENOL) 325 MG tablet Take 650 mg by mouth every 6 (six)  hours as needed for mild pain, fever or headache.    Marland Kitchen amoxicillin-clavulanate (AUGMENTIN) 875-125 MG tablet Take 1 tablet by mouth 2 (two) times daily. 14 tablet 0  . Ascorbic Acid (VITAMIN C PO) Take 1 tablet by mouth daily.    . Biotin 1 MG CAPS Take 1 mg by mouth daily.     . carboxymethylcellulose (REFRESH PLUS) 0.5 % SOLN Place 1 drop into both eyes 3 (three) times daily as needed (for dry eyes.).    Marland Kitchen Cholecalciferol (VITAMIN D3 SUPER STRENGTH) 50 MCG (2000 UT) TABS Take 2,000 Units by mouth daily.    Marland Kitchen lidocaine-prilocaine (EMLA) cream Apply 1 application topically as needed. Port access    . ondansetron (ZOFRAN) 8 MG tablet TAKE 1 TABLET (8 MG TOTAL) BY MOUTH EVERY 8 (EIGHT) HOURS AS NEEDED FOR REFRACTORY NAUSEA / VOMITING. START ON DAY 3 AFTER CHEMO. (Patient taking differently: Take 8 mg by mouth every 8 (eight) hours as needed for nausea or vomiting. ) 30 tablet 1  . oxyCODONE 10 MG TABS Take 1 tablet (10 mg total) by mouth every 4 (four) hours as needed for severe pain. 60 tablet 0  . polyethylene glycol (MIRALAX / GLYCOLAX) 17 g packet Take 17 g by mouth daily. 14 each 0  . prochlorperazine (COMPAZINE) 10 MG tablet Take 1 tablet (10 mg total) by mouth every 6 (six) hours as needed (Nausea or vomiting). 30 tablet 1  . senna-docusate (SENOKOT-S) 8.6-50 MG tablet Take 1 tablet by mouth at bedtime. 30 tablet 0   No current facility-administered  medications for this visit.    PHYSICAL EXAMINATION: ECOG PERFORMANCE STATUS: 1 - Symptomatic but completely ambulatory  Vitals:   03/11/20 0921  BP: (!) 143/70  Pulse: 88  Resp: 18  Temp: 99.2 F (37.3 C)  SpO2: 99%   Filed Weights   03/11/20 0921  Weight: 151 lb 12.8 oz (68.9 kg)    GENERAL:alert, no distress and comfortable NEURO: alert & oriented x 3 with fluent speech, no focal motor/sensory deficits  LABORATORY DATA:  I have reviewed the data as listed    Component Value Date/Time   NA 138 03/08/2020 0511   K 4.3  03/08/2020 0511   CL 103 03/08/2020 0511   CO2 26 03/08/2020 0511   GLUCOSE 102 (H) 03/08/2020 0511   BUN 13 03/08/2020 0511   CREATININE 0.47 03/08/2020 0511   CREATININE 0.70 03/03/2020 1205   CALCIUM 8.8 (L) 03/08/2020 0511   PROT 6.6 03/03/2020 1205   ALBUMIN 3.5 03/03/2020 1205   AST 18 03/03/2020 1205   ALT 14 03/03/2020 1205   ALKPHOS 63 03/03/2020 1205   BILITOT 0.3 03/03/2020 1205   GFRNONAA >60 03/08/2020 0511   GFRNONAA >60 03/03/2020 1205   GFRAA >60 01/17/2020 1339    No results found for: SPEP, UPEP  Lab Results  Component Value Date   WBC 7.2 03/08/2020   NEUTROABS 4.9 03/03/2020   HGB 13.2 03/08/2020   HCT 41.1 03/08/2020   MCV 88.8 03/08/2020   PLT 213 03/08/2020      Chemistry      Component Value Date/Time   NA 138 03/08/2020 0511   K 4.3 03/08/2020 0511   CL 103 03/08/2020 0511   CO2 26 03/08/2020 0511   BUN 13 03/08/2020 0511   CREATININE 0.47 03/08/2020 0511   CREATININE 0.70 03/03/2020 1205      Component Value Date/Time   CALCIUM 8.8 (L) 03/08/2020 0511   ALKPHOS 63 03/03/2020 1205   AST 18 03/03/2020 1205   ALT 14 03/03/2020 1205   BILITOT 0.3 03/03/2020 1205       RADIOGRAPHIC STUDIES: I have personally reviewed the radiological images as listed and agreed with the findings in the report. NM Pulmonary Perfusion  Result Date: 03/07/2020 CLINICAL DATA:  Positive D-dimer and shortness of breath. EXAM: NUCLEAR MEDICINE PERFUSION LUNG SCAN TECHNIQUE: Perfusion images were obtained in multiple projections after intravenous injection of radiopharmaceutical. Views: Anterior, posterior, left lateral, right lateral, RPO, LPO, RAO, LAO RADIOPHARMACEUTICALS:  4.35 mCi Tc-52mMAA IV COMPARISON:  Chest radiograph March 07, 2020. FINDINGS: Radiotracer uptake is homogeneous and symmetric bilaterally. No perfusion defects evident. IMPRESSION: No perfusion defects evident. No findings indicative of pulmonary embolus. Electronically Signed   By:  WLowella GripIII M.D.   On: 03/07/2020 11:44   CT ASPIRATION  Result Date: 03/07/2020 INDICATION: 79year old female with left lower quadrant fluid collection. She presents for aspiration, and if the fluid appears infected, drain placement. EXAM: CT-guided aspiration MEDICATIONS: None ANESTHESIA/SEDATION: Fentanyl 50 mcg IV; Versed 1.5 mg IV Moderate Sedation Time:  17 minutes The patient was continuously monitored during the procedure by the interventional radiology nurse under my direct supervision. COMPLICATIONS: None immediate. PROCEDURE: Informed written consent was obtained from the patient after a thorough discussion of the procedural risks, benefits and alternatives. All questions were addressed. Maximal Sterile Barrier Technique was utilized including caps, mask, sterile gowns, sterile gloves, sterile drape, hand hygiene and skin antiseptic. A timeout was performed prior to the initiation of the procedure. Axial CT imaging  was performed and the left lower quadrant fluid collections successfully identified. The overlying skin was sterilely prepped and draped in the standard fashion using chlorhexidine skin prep. Local anesthesia was attained by infiltration with 1% lidocaine. A small dermatotomy was made. Under intermittent CT guidance, an 18 gauge trocar needle was successfully advanced into the center of the fluid collection. Aspiration was performed yielding approximately 5 mL of simple straw-colored serous fluid. No purulence, no internal debris. Given the simple appearing nature of the fluid, the decision was made not to place a drain at this time. The needle was removed. Follow-up CT imaging demonstrates complete collapse of the small fluid pocket. IMPRESSION: 1. Aspiration of left lower quadrant fluid collection yields 5 mL simple straw-colored serous fluid. 2. As the fluid appears to be simple in nature, no drainage catheter was placed. 3. The aspirated fluid was sent 4 body fluid culture  to confirm its sterility. Electronically Signed   By: Jacqulynn Cadet M.D.   On: 03/07/2020 13:13

## 2020-03-11 NOTE — Assessment & Plan Note (Signed)
After very prolonged discussion about her atypical chest pain, it is clear that it is not due to PE, likely exacerbated by possible reflux or panic attack She have extensive evaluation for this I do not recommend further evaluation of this atypical chest pain Observe closely for now

## 2020-03-11 NOTE — Telephone Encounter (Signed)
Misty Hopkins and reviewed timing for prednisone and benadryl before her CT scan on 03/19/20.

## 2020-03-11 NOTE — Assessment & Plan Note (Signed)
She underwent CT-guided drainage of the free fluid of the abdomen Culture was negative It is not clear whether the free fluid was due to cancer or recent infection Surprisingly, her pain went away The only difference in terms of management between last week and this week is a course of broad-spectrum IV antibiotics that she received in the hospital I suspect, if she does have acute diverticulitis, that might have caused temporary relief of her symptoms I recommend a course of Augmentin for her As above, I plan to repeat CT imaging with contrast for further evaluation

## 2020-03-11 NOTE — Assessment & Plan Note (Signed)
We will attempt to obtain outside CT imaging However, the outside CT was done without contrast and it is hard to interpret without contrast I recommend 1 week course of antibiotic therapy and order CT imaging with contrast for further evaluation of the disease status If indeed she had recent diverticulitis and free fluid in the abdomen as a result of infection, that could explain the elevation of her tumor marker and not necessary from disease progression She is in agreement with the plan of care to

## 2020-03-17 ENCOUNTER — Other Ambulatory Visit: Payer: Self-pay

## 2020-03-17 ENCOUNTER — Inpatient Hospital Stay (HOSPITAL_BASED_OUTPATIENT_CLINIC_OR_DEPARTMENT_OTHER): Payer: Medicare HMO | Admitting: Hematology and Oncology

## 2020-03-17 ENCOUNTER — Other Ambulatory Visit: Payer: Self-pay | Admitting: Oncology

## 2020-03-17 ENCOUNTER — Other Ambulatory Visit: Payer: Self-pay | Admitting: Hematology and Oncology

## 2020-03-17 ENCOUNTER — Telehealth: Payer: Self-pay

## 2020-03-17 DIAGNOSIS — Z7189 Other specified counseling: Secondary | ICD-10-CM | POA: Diagnosis not present

## 2020-03-17 DIAGNOSIS — G893 Neoplasm related pain (acute) (chronic): Secondary | ICD-10-CM | POA: Diagnosis not present

## 2020-03-17 DIAGNOSIS — K5792 Diverticulitis of intestine, part unspecified, without perforation or abscess without bleeding: Secondary | ICD-10-CM

## 2020-03-17 DIAGNOSIS — Z5111 Encounter for antineoplastic chemotherapy: Secondary | ICD-10-CM | POA: Diagnosis not present

## 2020-03-17 DIAGNOSIS — C5701 Malignant neoplasm of right fallopian tube: Secondary | ICD-10-CM | POA: Diagnosis not present

## 2020-03-17 NOTE — Telephone Encounter (Signed)
-----   Message from [name], MD sent at 03/17/2020  8:17 AM EST ----- Regarding: appt today Can you call and request we do a virtual visit at 230 pm today? To review recommendation from TB today and discuss plan E-visit, 30 mins

## 2020-03-17 NOTE — Assessment & Plan Note (Signed)
We have extensive discussion about plan of care today both at the tumor board and through the review on the E-visit She is in agreement to proceed with next cycle of treatment around December 6 and I will get that scheduled I explained to the patient the rationale of canceling her CT imaging and reschedule to next year

## 2020-03-17 NOTE — Assessment & Plan Note (Signed)
It is not clear whether she have recent acute diverticulitis She has completed a course of antibiotics treatment It has resolved Observe closely for now

## 2020-03-17 NOTE — Progress Notes (Signed)
HEMATOLOGY-ONCOLOGY ELECTRONIC VISIT PROGRESS NOTE  Patient Care Team: Allie Dimmer, MD as PCP - General (Internal Medicine) [name], RN as Oncology Nurse Navigator (Oncology)  I connected with by [facility] video conference and verified that I am speaking with the correct person using two identifiers.  Due to technological failure, today's visit is changed to telephone call visit to review plan of care  ASSESSMENT & PLAN:  Fallopian tube cancer, carcinoma, right ([place]) Her case was fully discussed at the GYN oncology tumor board today I radiologist was able to get a fairly accurate assessment despite lack of contrast on recent CT imaging Overall, we felt that she have stable disease control The cause of the fluid collection is unknown but since she had aspiration of the fluid collection, her symptom has resolved Despite recent elevated tumor marker, with stability of CT imaging, the plan would be to proceed with several more cycles of chemotherapy before repeating imaging study early next year I will proceed to cancel her treatment that was scheduled for Wednesday She is in agreement with the plan of care  Acute diverticulitis It is not clear whether she have recent acute diverticulitis She has completed a course of antibiotics treatment It has resolved Observe closely for now  Cancer associated pain Her cancer pain has resolved She will take pain medicine as needed  Goals of care, counseling/discussion We have extensive discussion about plan of care today both at the tumor board and through the review on the E-visit She is in agreement to proceed with next cycle of treatment around December 6 and I will get that scheduled I explained to the patient the rationale of canceling her CT imaging and reschedule to next year    No orders of the defined types were placed in this encounter.   INTERVAL HISTORY: Please see below for problem oriented charting. The appointment is  made today to review plan of care after recent discussion at the GYN oncology tumor board Since last time I saw her, she developed yeast infection while on antibiotics treatment, overall improved She denies abdominal pain or changes in bowel habits She felt a whole lot better No recent nausea  SUMMARY OF ONCOLOGIC HISTORY: Oncology History Overview Note  High grade serous, right fallopian tube  HRD, BRCA tumor testing were neg   Ovarian cancer ([place])  03/21/2018 Initial Diagnosis   Ovarian cancer ([place])   02/04/2020 -  Chemotherapy   The patient had carboplatin and doxil for chemotherapy treatment.     Fallopian tube cancer, carcinoma, right ([place])  10/24/2017 Initial Diagnosis   She has presentation of vague abdominal pain   02/20/2018 Imaging   Outside CT abdomen and pelvis: This revealed an incidental finding of a 1.5 cm subcapsular lesion in the lateral upper pole of the left kidney which measured higher than fluid attenuation which could represent a proteinaceous renal cyst or solid renal mass.  There was no ascites.  There is no lymphadenopathy.  There were no masses in the pelvis including ovarian or adnexal masses seen.  There was however soft tissue stranding seen with nodularity in the omental fat in the lower abdomen without evidence of ascites.  This was felt to represent either carcinomatosis or inflammatory infectious etiologies.  There was colonic diverticulosis seen without radiographic evidence of diverticulitis.  The radiologist recommended MRI of the abdomen to further work-up the renal mass   02/22/2018 Tumor Marker   Patient's tumor was tested for the following markers: CA-125. Results of the  tumor marker test revealed 103.   03/06/2018 Pathology Results   Omentum, biopsy - ADENOCARCINOMA WITH PSAMMOMA BODIES, SEE COMMENT. Microscopic Comment There are scattered small foci of malignant glands with associated psammoma bodies. While the tumor is somewhat limited the  cells have a more low grade appearance. There is prominent lymphovascular invasion. Immunohistochemistry reveals the cells are positive for cytokeratin 7, ER, MOC31, PAX8, and WT-1. They are negative for calretinin, cytokeratin 20, CDX-2, and TTF-1. Overall, the findings are consistent with adenocarcinoma. The differential includes a primary gynecologic or peritoneal tumor, although the morphology is not typical of a high grade serous carcinoma.   03/06/2018 Surgery   Surgeon: Donaciano Eva   Pre-operative Diagnosis: omental mass, elevated CA 125 Operation: Laparoscopic omentectomy  Surgeon: Donaciano Eva  Operative Findings:  : normal diaphragm and upper omentum. Sigmoid colon densely adherent to left pelvic side wall consistent with history of diverticulitis. Omentum adherent to anterior abdominal wall and sigmoid colon. Surgically absent uterus. Right ovary very small and grossly normal. Left ovary obscured by adhesed sigmoid colon. No ascites. No carcinomatosis. There was a nodular firm distal omentum on the left which was adherent to the sigmoid colon and most consistent with inflammatory change.      03/21/2018 Pathology Results   1. Soft tissue mass, simple excision, midline abdominal wall mass - METASTATIC SEROUS CARCINOMA. 2. Omentum, resection for tumor - METASTATIC SEROUS CARCINOMA. 3. Adnexa - ovary +/- tube, neoplastic, right - RIGHT FALLOPIAN TUBE: -HIGH GRADE SEROUS CARCINOMA. -SEE ONCOLOGY TABLE. - RIGHT OVARY: SURFACE PSAMMOMA BODIES. NO DEFINITIVE MALIGNANT CELLS. -INCLUSION CYSTS. 4. Adnexa - ovary +/- tube, neoplastic, left - LEFT FALLOPIAN TUBE: LUMINAL AND SURFACE SEROUS CARCINOMA. - LEFT OVARY: FOCAL PSAMMOMA BODIES AND MALIGNANT CELLS. 5. Soft tissue mass, simple excision, left abdominal wall - METASTATIC SEROUS CARCINOMA. Microscopic Comment 3. OVARY or FALLOPIAN TUBE or PRIMARY PERITONEUM: Procedure: Bilateral salpingo-oophorectomy.  Abdominal wall mass excision and omentectomy. Specimen Integrity: Intact. Tumor Site: Right fallopian tube. Ovarian Surface Involvement (required only if applicable): Present (left). Fallopian Tube Surface Involvement (required only if applicable): Present. Tumor Size: 1.5 cm. Histologic Type: High grade serous carcinoma. Histologic Grade: High grade. Implants (required for advanced stage serous/seromucinous borderline tumors only): Present. Other Tissue/ Organ Involvement: Omentum, abdominal wall, left fallopian tube and ovary. Largest Extrapelvic Peritoneal Focus (required only if applicable): >2 cm. Peritoneal/Ascitic Fluid: N/A. Treatment Effect (required only for high-grade serous carcinomas): N/A. Regional Lymph Nodes: No lymph nodes submitted or found Pathologic Stage Classification (pTNM, AJCC 8th Edition): pT3c, pNX Representative Tumor Block: 3A Comment(s): None.   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   Surgeon: Thereasa Solo, MD. Specimens: Bilateral tubes / ovaries, omentum. Midline abdominal wall mass, left lateral abdominal wall mass.    Operative Findings:  Abdominal wall masses consistent with port site metastases at the umbilical incision (7cm) and left lateral incision (5cm). Omental caking in the infracolic omentum. Grossly normal very small tubes and ovaries. Normal diaphragms. No lymphadenopathy. Normal small and large intestine with the exception of miliary studding of tumor on the surface of the sigmoid colon and rectum in the cul de sac.    This represented an optimal cytoreduction (R1) with no gross visible disease remaining, however there was a thin rind of tumor on the lateral left fascia associated with the port site  metastasis.      04/11/2018 Cancer Staging   Staging form: Ovary, Fallopian Tube, and Primary Peritoneal Carcinoma, AJCC 8th Edition - Pathologic: FIGO Stage IIIC (pT3, pN0, cM0) - Signed by [name], MD on 04/11/2018   05/23/2018 Imaging   1. Interval resolution of the anterior midline abdominal wall seroma. There is some persistent wispy soft tissue attenuation in the region of the midline incision, nonspecific and may simply reflect granulation tissue from surgery. 2. Stable 13 mm exophytic lesion posterior left kidney with attenuation higher than expected for a simple cyst. This may be a cyst complicated by proteinaceous debris or hemorrhage, but attention on follow-up recommended. 3. Stable mild persistent distention of proximal jejunal loops with gradual tapering to nondilated distal small bowel. 4. No overt peritoneal or mesenteric disease identified on this exam.   05/23/2018 Tumor Marker   Patient's tumor was tested for the following markers: CA-125 Results of the tumor marker test revealed 11.6   06/02/2018 - 09/20/2018 Chemotherapy   The patient had carboplatin and taxol   06/19/2018 Procedure   Status post right IJ port catheter placement. Catheter ready for use.   06/26/2018 Tumor Marker   Patient's tumor was tested for the following markers: CA-125. Results of the tumor marker test revealed 10.7   09/20/2018 Tumor Marker   Patient's tumor was tested for the following markers: CA-125. Results of the tumor marker test revealed 9.1   10/25/2018 Imaging   1. There is suspicious, abnormal asymmetric increased soft tissue involving the cecum and ascending colon. This is suspicious for residual/progressive serosal involvement by tumor. 2. No additional sites of disease identified. No evidence for nodal metastasis, solid organ metastasis or ascites.     11/07/2018 Procedure   She had negative colonoscopy evaluation   12/05/2018 Tumor Marker   Patient's tumor was tested for the following markers:  CA-125 Results of the tumor marker test revealed 8.6   01/25/2019 Imaging   Ct abdomen and pelvis Signs of omentectomy and hysterectomy without definitive signs of residual recurrent disease. Small nodular focus bridging rectum and vagina is stable dating back January to 12/14/2018, potentially postoperative but suggest attention on subsequent imaging.   01/25/2019 Tumor Marker   Patient's tumor was tested for the following markers: CA-125 Results of the tumor marker test revealed 10.3   03/15/2019 Tumor Marker   Patient's tumor was tested for the following markers: CA-125 Results of the tumor marker test revealed 9.6   03/23/2019 Imaging   No acute findings in the abdomen or pelvis.   Colonic diverticulosis.  No active diverticulitis.   Prior open tectum E and hysterectomy.   Aortic atherosclerosis.     05/10/2019 Tumor Marker   Patient's tumor was tested for the following markers: CA-125 Results of the tumor marker test revealed 11.8   05/22/2019 Imaging   1. Stable exam. No evidence of recurrent or metastatic carcinoma within the abdomen or pelvis. 2. Colonic diverticulosis, without radiographic evidence of diverticulitis.     11/08/2019 Imaging   1. Status post hysterectomy and bilateral oophorectomy. 2. Soft tissue thickening within the deep pelvis which when compared to multiple prior exams is felt to be slowly progressive. Especially given the elevated CA 125 level, this is suspicious for peritoneal recurrence. Potential imaging strategies include PET (which may be of low sensitivity secondary to the lack of well-defined dominant mass) or pelvic CT follow-up at 3 months. 3. Otherwise, no evidence of metastatic disease within the chest, abdomen, or pelvis. 4. Coronary  artery atherosclerosis. Aortic Atherosclerosis (ICD10-I70.0). 5. Ventral abdominal wall hernia containing nonobstructive transverse colon, as before. 6.  Possible constipation.   12/21/2019 Tumor Marker    Patient's tumor was tested for the following markers: 95.2. Results of the tumor marker test revealed CA-`125.   01/17/2020 Imaging   1. Similar appearance of nodularity in the central pelvis along the vaginal cuff. There is a particular nodular soft tissue density measuring 2.2 x 1.6 cm along the wall of the proximal rectum that is slightly more conspicuous than previously, raising concern for possible minimal progression. Similar appearance of the other areas of nodular soft tissue in the central pelvis. PET-CT may prove helpful to further evaluate. 2. No ascites. 3. Left colonic diverticulosis without diverticulitis. 4. 1.7 cm exophytic lesion upper pole left kidney with well-defined homogeneous attenuation slightly higher than would be expected for simple fluid. This is probably a complex cyst. Attention on follow-up recommended. 5. Aortic Atherosclerosis (ICD10-I70.0).   01/17/2020 Tumor Marker   Patient's tumor was tested for the following markers: CA-125 Results of the tumor marker test revealed 135   01/30/2020 Echocardiogram   1. Left ventricular ejection fraction, by estimation, is 55 to 60%. Left ventricular ejection fraction by PLAX is 55 %. The left ventricle has normal function. The left ventricle has no regional wall motion abnormalities. Left ventricular diastolic parameters are indeterminate.  2. Right ventricular systolic function is normal. The right ventricular size is normal.  3. Left atrial size was borderline dilated.  4. The mitral valve is grossly normal. Trivial mitral valve regurgitation.  5. The aortic valve was not well visualized. Aortic valve regurgitation is trivial.  6. The inferior vena cava is normal in size with greater than 50% respiratory variability, suggesting right atrial pressure of 3 mmHg.   02/04/2020 -  Chemotherapy   The patient had carboplatin and doxil for chemotherapy treatment.     03/03/2020 Tumor Marker   Patient's tumor was tested for the  following markers: CA-125 Results of the tumor marker test revealed 272   03/07/2020 Procedure   1. Aspiration of left lower quadrant fluid collection yields 5 mL simple straw-colored serous fluid. 2. As the fluid appears to be simple in nature, no drainage catheter was placed. 3. The aspirated fluid was sent 4 body fluid culture to confirm its sterility.   03/07/2020 Imaging   VQ scan No perfusion defects evident. No findings indicative of pulmonary embolus.   03/07/2020 Imaging   Outside CT Multiple serosal implants again noted within the pelvis. However, increasing peritoneal nodularity and peritoneal thickening noted within the flanks and right upper quadrant as well as developing trace ascites within the mesentery all suspicious for progressive metastatic disease. Correlation with tumor markers may be helpful for further management.   Interval development of a 3.2 cm fluid collection within the left lower quadrant. Given its relatively rapid development, and alternative etiology such as a diverticular abscess should be considered. Recurrent disease, particular given the patient's history of serous adenocarcinoma, is not definitively excluded     REVIEW OF SYSTEMS:   Constitutional: Denies fevers, chills or abnormal weight loss Eyes: Denies blurriness of vision Ears, nose, mouth, throat, and face: Denies mucositis or sore throat Respiratory: Denies cough, dyspnea or wheezes Cardiovascular: Denies palpitation, chest discomfort Gastrointestinal:  Denies nausea, heartburn or change in bowel habits Skin: Denies abnormal skin rashes Lymphatics: Denies new lymphadenopathy or easy bruising Neurological:Denies numbness, tingling or new weaknesses Behavioral/Psych: Mood is stable, no new changes  Extremities: No  lower extremity edema All other systems were reviewed with the patient and are negative.  I have reviewed the past medical history, past surgical history, social history and  family history with the patient and they are unchanged from previous note.  ALLERGIES:  is allergic to ivp dye [iodinated diagnostic agents], propoxyphene, codeine, and ultram [tramadol hcl].  MEDICATIONS:  Current Outpatient Medications  Medication Sig Dispense Refill  . acetaminophen (TYLENOL) 325 MG tablet Take 650 mg by mouth every 6 (six) hours as needed for mild pain, fever or headache.    . Ascorbic Acid (VITAMIN C PO) Take 1 tablet by mouth daily.    . Biotin 1 MG CAPS Take 1 mg by mouth daily.     . carboxymethylcellulose (REFRESH PLUS) 0.5 % SOLN Place 1 drop into both eyes 3 (three) times daily as needed (for dry eyes.).    Marland Kitchen Cholecalciferol (VITAMIN D3 SUPER STRENGTH) 50 MCG (2000 UT) TABS Take 2,000 Units by mouth daily.    Marland Kitchen lidocaine-prilocaine (EMLA) cream Apply 1 application topically as needed. Port access    . ondansetron (ZOFRAN) 8 MG tablet TAKE 1 TABLET (8 MG TOTAL) BY MOUTH EVERY 8 (EIGHT) HOURS AS NEEDED FOR REFRACTORY NAUSEA / VOMITING. START ON DAY 3 AFTER CHEMO. (Patient taking differently: Take 8 mg by mouth every 8 (eight) hours as needed for nausea or vomiting. ) 30 tablet 1  . oxyCODONE 10 MG TABS Take 1 tablet (10 mg total) by mouth every 4 (four) hours as needed for severe pain. 60 tablet 0  . polyethylene glycol (MIRALAX / GLYCOLAX) 17 g packet Take 17 g by mouth daily. 14 each 0  . prochlorperazine (COMPAZINE) 10 MG tablet Take 1 tablet (10 mg total) by mouth every 6 (six) hours as needed (Nausea or vomiting). 30 tablet 1  . senna-docusate (SENOKOT-S) 8.6-50 MG tablet Take 1 tablet by mouth at bedtime. 30 tablet 0   No current facility-administered medications for this visit.    PHYSICAL EXAMINATION: ECOG PERFORMANCE STATUS: 1 - Symptomatic but completely ambulatory  LABORATORY DATA:  I have reviewed the data as listed CMP Latest Ref Rng & Units 03/08/2020 03/07/2020 03/03/2020  Glucose 70 - 99 mg/dL 102(H) - 96  BUN 8 - 23 mg/dL 13 - 12  Creatinine  0.44 - 1.00 mg/dL 0.47 0.48 0.70  Sodium 135 - 145 mmol/L 138 - 140  Potassium 3.5 - 5.1 mmol/L 4.3 - 4.0  Chloride 98 - 111 mmol/L 103 - 107  CO2 22 - 32 mmol/L 26 - 27  Calcium 8.9 - 10.3 mg/dL 8.8(L) - 8.9  Total Protein 6.5 - 8.1 g/dL - - 6.6  Total Bilirubin 0.3 - 1.2 mg/dL - - 0.3  Alkaline Phos 38 - 126 U/L - - 63  AST 15 - 41 U/L - - 18  ALT 0 - 44 U/L - - 14    Lab Results  Component Value Date   WBC 7.2 03/08/2020   HGB 13.2 03/08/2020   HCT 41.1 03/08/2020   MCV 88.8 03/08/2020   PLT 213 03/08/2020   NEUTROABS 4.9 03/03/2020     RADIOGRAPHIC STUDIES: His CT imaging was discussed at the GYN tumor board and reviewed I have personally reviewed the radiological images as listed and agreed with the findings in the report. NM Pulmonary Perfusion  Result Date: 03/07/2020 CLINICAL DATA:  Positive D-dimer and shortness of breath. EXAM: NUCLEAR MEDICINE PERFUSION LUNG SCAN TECHNIQUE: Perfusion images were obtained in multiple projections after intravenous injection of  radiopharmaceutical. Views: Anterior, posterior, left lateral, right lateral, RPO, LPO, RAO, LAO RADIOPHARMACEUTICALS:  4.35 mCi Tc-70mMAA IV COMPARISON:  Chest radiograph March 07, 2020. FINDINGS: Radiotracer uptake is homogeneous and symmetric bilaterally. No perfusion defects evident. IMPRESSION: No perfusion defects evident. No findings indicative of pulmonary embolus. Electronically Signed   By: WLowella GripIII M.D.   On: 03/07/2020 11:44   CT ASPIRATION  Result Date: 03/07/2020 INDICATION: 79year old female with left lower quadrant fluid collection. She presents for aspiration, and if the fluid appears infected, drain placement. EXAM: CT-guided aspiration MEDICATIONS: None ANESTHESIA/SEDATION: Fentanyl 50 mcg IV; Versed 1.5 mg IV Moderate Sedation Time:  17 minutes The patient was continuously monitored during the procedure by the interventional radiology nurse under my direct supervision.  COMPLICATIONS: None immediate. PROCEDURE: Informed written consent was obtained from the patient after a thorough discussion of the procedural risks, benefits and alternatives. All questions were addressed. Maximal Sterile Barrier Technique was utilized including caps, mask, sterile gowns, sterile gloves, sterile drape, hand hygiene and skin antiseptic. A timeout was performed prior to the initiation of the procedure. Axial CT imaging was performed and the left lower quadrant fluid collections successfully identified. The overlying skin was sterilely prepped and draped in the standard fashion using chlorhexidine skin prep. Local anesthesia was attained by infiltration with 1% lidocaine. A small dermatotomy was made. Under intermittent CT guidance, an 18 gauge trocar needle was successfully advanced into the center of the fluid collection. Aspiration was performed yielding approximately 5 mL of simple straw-colored serous fluid. No purulence, no internal debris. Given the simple appearing nature of the fluid, the decision was made not to place a drain at this time. The needle was removed. Follow-up CT imaging demonstrates complete collapse of the small fluid pocket. IMPRESSION: 1. Aspiration of left lower quadrant fluid collection yields 5 mL simple straw-colored serous fluid. 2. As the fluid appears to be simple in nature, no drainage catheter was placed. 3. The aspirated fluid was sent 4 body fluid culture to confirm its sterility. Electronically Signed   By: HJacqulynn CadetM.D.   On: 03/07/2020 13:13    I discussed the assessment and treatment plan with the patient. The patient was provided an opportunity to ask questions and all were answered. The patient agreed with the plan and demonstrated an understanding of the instructions. The patient was advised to call back or seek an in-person evaluation if the symptoms worsen or if the condition fails to improve as anticipated.    I spent 30 minutes for the  appointment reviewing test results, discuss management and coordination of care.  NHeath Lark MD 03/17/2020 4:37 PM

## 2020-03-17 NOTE — Assessment & Plan Note (Signed)
Her cancer pain has resolved She will take pain medicine as needed

## 2020-03-17 NOTE — Telephone Encounter (Signed)
Called and given below message. She verbalized understanding. She is available to 2:30 pm today and it will have to be a telephone call. She ask that Dr. Alvy Bimler call her husbands cell at [phone].

## 2020-03-17 NOTE — Assessment & Plan Note (Signed)
Her case was fully discussed at the GYN oncology tumor board today I radiologist was able to get a fairly accurate assessment despite lack of contrast on recent CT imaging Overall, we felt that she have stable disease control The cause of the fluid collection is unknown but since she had aspiration of the fluid collection, her symptom has resolved Despite recent elevated tumor marker, with stability of CT imaging, the plan would be to proceed with several more cycles of chemotherapy before repeating imaging study early next year I will proceed to cancel her treatment that was scheduled for Wednesday She is in agreement with the plan of care

## 2020-03-17 NOTE — Progress Notes (Signed)
Gynecologic Oncology Multi-Disciplinary Disposition Conference Note  Date of the Conference: 03/17/2020  Patient Name: Misty Hopkins  Referring Provider: Dr. Helane Rima Primary GYN Oncologist: Dr. 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 Disposition is to chemotherapy (couple more cycles) then repeat CT scan.   This Multidisciplinary conference took place involving physicians from [place], [place], Radiation Oncology, Pathology, Radiology along with the Gynecologic Oncology Nurse Practitioner and RN.  Comprehensive assessment of the patient's malignancy, staging, need for surgery, chemotherapy, radiation therapy, and need for further testing were reviewed. Supportive measures, both inpatient and following discharge were also discussed. The recommended plan of care is documented. Greater than 35 minutes were spent correlating and coordinating this patient's care.

## 2020-03-18 ENCOUNTER — Telehealth: Payer: Self-pay | Admitting: Hematology and Oncology

## 2020-03-18 NOTE — Telephone Encounter (Signed)
Scheduled appt per 11/22 sch msg - pt is aware of appts scheduled.

## 2020-03-19 ENCOUNTER — Ambulatory Visit (HOSPITAL_COMMUNITY): Payer: Medicare HMO

## 2020-03-19 ENCOUNTER — Ambulatory Visit: Payer: Medicare HMO | Admitting: Hematology and Oncology

## 2020-03-21 ENCOUNTER — Inpatient Hospital Stay: Payer: Medicare HMO | Admitting: Hematology and Oncology

## 2020-03-26 ENCOUNTER — Telehealth: Payer: Self-pay | Admitting: Oncology

## 2020-03-26 NOTE — Telephone Encounter (Signed)
Misty Hopkins called and had questions regarding how many treatments Dr. Alvy Bimler is recommending.  Advised her per Dr. Calton Dach office note - she is recommending "several more cycles of chemotherapy before repeating imaging study early next year." Also reviewed upcoming appointments.  She verbalized understanding and agreement.

## 2020-03-31 ENCOUNTER — Inpatient Hospital Stay: Payer: Medicare HMO | Attending: Hematology and Oncology

## 2020-03-31 ENCOUNTER — Inpatient Hospital Stay: Payer: Medicare HMO

## 2020-03-31 ENCOUNTER — Encounter: Payer: Self-pay | Admitting: Hematology and Oncology

## 2020-03-31 ENCOUNTER — Other Ambulatory Visit: Payer: Self-pay

## 2020-03-31 ENCOUNTER — Inpatient Hospital Stay (HOSPITAL_BASED_OUTPATIENT_CLINIC_OR_DEPARTMENT_OTHER): Payer: Medicare HMO | Admitting: Hematology and Oncology

## 2020-03-31 ENCOUNTER — Other Ambulatory Visit: Payer: Self-pay | Admitting: Hematology and Oncology

## 2020-03-31 DIAGNOSIS — I2510 Atherosclerotic heart disease of native coronary artery without angina pectoris: Secondary | ICD-10-CM | POA: Insufficient documentation

## 2020-03-31 DIAGNOSIS — G893 Neoplasm related pain (acute) (chronic): Secondary | ICD-10-CM

## 2020-03-31 DIAGNOSIS — Z7189 Other specified counseling: Secondary | ICD-10-CM

## 2020-03-31 DIAGNOSIS — Z79899 Other long term (current) drug therapy: Secondary | ICD-10-CM | POA: Diagnosis not present

## 2020-03-31 DIAGNOSIS — C5701 Malignant neoplasm of right fallopian tube: Secondary | ICD-10-CM | POA: Diagnosis not present

## 2020-03-31 DIAGNOSIS — C569 Malignant neoplasm of unspecified ovary: Secondary | ICD-10-CM

## 2020-03-31 DIAGNOSIS — K5909 Other constipation: Secondary | ICD-10-CM | POA: Diagnosis not present

## 2020-03-31 DIAGNOSIS — C792 Secondary malignant neoplasm of skin: Secondary | ICD-10-CM | POA: Diagnosis not present

## 2020-03-31 DIAGNOSIS — C561 Malignant neoplasm of right ovary: Secondary | ICD-10-CM | POA: Insufficient documentation

## 2020-03-31 DIAGNOSIS — Z885 Allergy status to narcotic agent status: Secondary | ICD-10-CM | POA: Insufficient documentation

## 2020-03-31 DIAGNOSIS — C786 Secondary malignant neoplasm of retroperitoneum and peritoneum: Secondary | ICD-10-CM | POA: Diagnosis not present

## 2020-03-31 DIAGNOSIS — Z9221 Personal history of antineoplastic chemotherapy: Secondary | ICD-10-CM | POA: Insufficient documentation

## 2020-03-31 DIAGNOSIS — Z5111 Encounter for antineoplastic chemotherapy: Secondary | ICD-10-CM | POA: Insufficient documentation

## 2020-03-31 DIAGNOSIS — I700 Atherosclerosis of aorta: Secondary | ICD-10-CM | POA: Insufficient documentation

## 2020-03-31 DIAGNOSIS — I351 Nonrheumatic aortic (valve) insufficiency: Secondary | ICD-10-CM | POA: Diagnosis not present

## 2020-03-31 LAB — CBC WITH DIFFERENTIAL (CANCER CENTER ONLY)
Abs Immature Granulocytes: 0.05 10*3/uL (ref 0.00–0.07)
Basophils Absolute: 0 10*3/uL (ref 0.0–0.1)
Basophils Relative: 1 %
Eosinophils Absolute: 0.1 10*3/uL (ref 0.0–0.5)
Eosinophils Relative: 2 %
HCT: 38.6 % (ref 36.0–46.0)
Hemoglobin: 12.7 g/dL (ref 12.0–15.0)
Immature Granulocytes: 1 %
Lymphocytes Relative: 34 %
Lymphs Abs: 3 10*3/uL (ref 0.7–4.0)
MCH: 28.7 pg (ref 26.0–34.0)
MCHC: 32.9 g/dL (ref 30.0–36.0)
MCV: 87.3 fL (ref 80.0–100.0)
Monocytes Absolute: 1.1 10*3/uL — ABNORMAL HIGH (ref 0.1–1.0)
Monocytes Relative: 12 %
Neutro Abs: 4.5 10*3/uL (ref 1.7–7.7)
Neutrophils Relative %: 50 %
Platelet Count: 193 10*3/uL (ref 150–400)
RBC: 4.42 MIL/uL (ref 3.87–5.11)
RDW: 15.6 % — ABNORMAL HIGH (ref 11.5–15.5)
WBC Count: 8.8 10*3/uL (ref 4.0–10.5)
nRBC: 0 % (ref 0.0–0.2)

## 2020-03-31 LAB — CMP (CANCER CENTER ONLY)
ALT: 15 U/L (ref 0–44)
AST: 17 U/L (ref 15–41)
Albumin: 3.8 g/dL (ref 3.5–5.0)
Alkaline Phosphatase: 60 U/L (ref 38–126)
Anion gap: 9 (ref 5–15)
BUN: 12 mg/dL (ref 8–23)
CO2: 25 mmol/L (ref 22–32)
Calcium: 9.8 mg/dL (ref 8.9–10.3)
Chloride: 107 mmol/L (ref 98–111)
Creatinine: 0.74 mg/dL (ref 0.44–1.00)
GFR, Estimated: >60 mL/min (ref >60)
Glucose, Bld: 94 mg/dL (ref 70–99)
Potassium: 3.8 mmol/L (ref 3.5–5.1)
Sodium: 141 mmol/L (ref 135–145)
Total Bilirubin: 0.4 mg/dL (ref 0.3–1.2)
Total Protein: 7.1 g/dL (ref 6.5–8.1)

## 2020-03-31 MED ORDER — DEXTROSE 5 % IV SOLN
Freq: Once | INTRAVENOUS | Status: AC
  Filled 2020-03-31: qty 250

## 2020-03-31 MED ORDER — HYDROMORPHONE HCL 2 MG PO TABS
2.0000 mg | ORAL_TABLET | ORAL | 0 refills | Status: DC | PRN
Start: 2020-03-31 — End: 2020-03-31

## 2020-03-31 MED ORDER — PALONOSETRON HCL INJECTION 0.25 MG/5ML
0.2500 mg | Freq: Once | INTRAVENOUS | Status: AC
  Administered 2020-03-31: 0.25 mg via INTRAVENOUS

## 2020-03-31 MED ORDER — SODIUM CHLORIDE 0.9 % IV SOLN
389.5000 mg | Freq: Once | INTRAVENOUS | Status: AC
  Administered 2020-03-31: 390 mg via INTRAVENOUS
  Filled 2020-03-31: qty 39

## 2020-03-31 MED ORDER — SODIUM CHLORIDE 0.9 % IV SOLN
150.0000 mg | Freq: Once | INTRAVENOUS | Status: AC
  Administered 2020-03-31: 150 mg via INTRAVENOUS
  Filled 2020-03-31: qty 150

## 2020-03-31 MED ORDER — SODIUM CHLORIDE 0.9 % IV SOLN
10.0000 mg | Freq: Once | INTRAVENOUS | Status: AC
  Administered 2020-03-31: 10 mg via INTRAVENOUS
  Filled 2020-03-31: qty 10

## 2020-03-31 MED ORDER — DOXORUBICIN HCL LIPOSOMAL CHEMO INJECTION 2 MG/ML
28.0000 mg/m2 | Freq: Once | INTRAVENOUS | Status: AC
  Administered 2020-03-31: 50 mg via INTRAVENOUS
  Filled 2020-03-31: qty 25

## 2020-03-31 MED ORDER — HEPARIN SOD (PORK) LOCK FLUSH 100 UNIT/ML IV SOLN
500.0000 [IU] | Freq: Once | INTRAVENOUS | Status: AC | PRN
  Administered 2020-03-31: 500 [IU]
  Filled 2020-03-31: qty 5

## 2020-03-31 MED ORDER — SODIUM CHLORIDE 0.9% FLUSH
10.0000 mL | Freq: Once | INTRAVENOUS | Status: AC
  Administered 2020-03-31: 10 mL
  Filled 2020-03-31: qty 10

## 2020-03-31 MED ORDER — PALONOSETRON HCL INJECTION 0.25 MG/5ML
INTRAVENOUS | Status: AC
  Filled 2020-03-31: qty 5

## 2020-03-31 MED ORDER — SODIUM CHLORIDE 0.9% FLUSH
10.0000 mL | INTRAVENOUS | Status: DC | PRN
  Administered 2020-03-31: 10 mL
  Filled 2020-03-31: qty 10

## 2020-03-31 MED FILL — HYDROmorphone HCL 2 MG TABS: 2 | 10 days supply | Qty: 60 | Fill #0

## 2020-03-31 NOTE — Progress Notes (Signed)
Patient stable upon leaving unit.

## 2020-03-31 NOTE — Assessment & Plan Note (Signed)
She has vague intermittent abdominal pain and has undergone extensive evaluation last month Now she is wondering whether she might have allergic reaction to her previously prescribed oxycodone I recommend we change her treatment to hydromorphone to take as needed

## 2020-03-31 NOTE — Progress Notes (Signed)
[place] OFFICE PROGRESS NOTE  Patient Care Team: Allie Dimmer, MD as PCP - General (Internal Medicine) [name], RN as Oncology Nurse Navigator (Oncology)  ASSESSMENT & PLAN:  Fallopian tube cancer, carcinoma, right [facility]) Her symptoms are almost back to normal since last time I saw her She will continue to take intermittent pain medicine as needed I plan to repeat imaging study at the end of January We will proceed with chemotherapy without delay She is in agreement with the plan of care  Cancer associated pain She has vague intermittent abdominal pain and has undergone extensive evaluation last month Now she is wondering whether she might have allergic reaction to her previously prescribed oxycodone I recommend we change her treatment to hydromorphone to take as needed  Other constipation She denies recent constipation but is made aware of risks of constipation while on pain medicine She will continue to take laxatives   No orders of the defined types were placed in this encounter.   All questions were answered. The patient knows to call the clinic with any problems, questions or concerns. The total time spent in the appointment was 20 minutes encounter with patients including review of chart and various tests results, discussions about plan of care and coordination of care plan   [name], MD 03/31/2020 2:23 PM  INTERVAL HISTORY: Please see below for problem oriented charting. She returns for further follow-up She is doing well She continues to have vague intermittent abdominal pain, well controlled with current prescribed oxycodone She saw her primary care doctor and is wondering whether she could be allergic to oxycodone because she have occasional skin itching No recent rash No recent nausea  SUMMARY OF ONCOLOGIC HISTORY: Oncology History Overview Note  High grade serous, right fallopian tube  HRD, BRCA tumor testing were neg   Ovarian  cancer ([place])  03/21/2018 Initial Diagnosis   Ovarian cancer ([place])   02/04/2020 -  Chemotherapy   The patient had carboplatin and doxil for chemotherapy treatment.     Fallopian tube cancer, carcinoma, right ([place])  10/24/2017 Initial Diagnosis   She has presentation of vague abdominal pain   02/20/2018 Imaging   Outside CT abdomen and pelvis: This revealed an incidental finding of a 1.5 cm subcapsular lesion in the lateral upper pole of the left kidney which measured higher than fluid attenuation which could represent a proteinaceous renal cyst or solid renal mass.  There was no ascites.  There is no lymphadenopathy.  There were no masses in the pelvis including ovarian or adnexal masses seen.  There was however soft tissue stranding seen with nodularity in the omental fat in the lower abdomen without evidence of ascites.  This was felt to represent either carcinomatosis or inflammatory infectious etiologies.  There was colonic diverticulosis seen without radiographic evidence of diverticulitis.  The radiologist recommended MRI of the abdomen to further work-up the renal mass   02/22/2018 Tumor Marker   Patient's tumor was tested for the following markers: CA-125. Results of the tumor marker test revealed 103.   03/06/2018 Pathology Results   Omentum, biopsy - ADENOCARCINOMA WITH PSAMMOMA BODIES, SEE COMMENT. Microscopic Comment There are scattered small foci of malignant glands with associated psammoma bodies. While the tumor is somewhat limited the cells have a more low grade appearance. There is prominent lymphovascular invasion. Immunohistochemistry reveals the cells are positive for cytokeratin 7, ER, MOC31, PAX8, and WT-1. They are negative for calretinin, cytokeratin 20, CDX-2, and TTF-1. Overall, the findings  are consistent with adenocarcinoma. The differential includes a primary gynecologic or peritoneal tumor, although the morphology is not typical of a high grade serous carcinoma.    03/06/2018 Surgery   Surgeon: Donaciano Eva   Pre-operative Diagnosis: omental mass, elevated CA 125 Operation: Laparoscopic omentectomy  Surgeon: Donaciano Eva  Operative Findings:  : normal diaphragm and upper omentum. Sigmoid colon densely adherent to left pelvic side wall consistent with history of diverticulitis. Omentum adherent to anterior abdominal wall and sigmoid colon. Surgically absent uterus. Right ovary very small and grossly normal. Left ovary obscured by adhesed sigmoid colon. No ascites. No carcinomatosis. There was a nodular firm distal omentum on the left which was adherent to the sigmoid colon and most consistent with inflammatory change.      03/21/2018 Pathology Results   1. Soft tissue mass, simple excision, midline abdominal wall mass - METASTATIC SEROUS CARCINOMA. 2. Omentum, resection for tumor - METASTATIC SEROUS CARCINOMA. 3. Adnexa - ovary +/- tube, neoplastic, right - RIGHT FALLOPIAN TUBE: -HIGH GRADE SEROUS CARCINOMA. -SEE ONCOLOGY TABLE. - RIGHT OVARY: SURFACE PSAMMOMA BODIES. NO DEFINITIVE MALIGNANT CELLS. -INCLUSION CYSTS. 4. Adnexa - ovary +/- tube, neoplastic, left - LEFT FALLOPIAN TUBE: LUMINAL AND SURFACE SEROUS CARCINOMA. - LEFT OVARY: FOCAL PSAMMOMA BODIES AND MALIGNANT CELLS. 5. Soft tissue mass, simple excision, left abdominal wall - METASTATIC SEROUS CARCINOMA. Microscopic Comment 3. OVARY or FALLOPIAN TUBE or PRIMARY PERITONEUM: Procedure: Bilateral salpingo-oophorectomy. Abdominal wall mass excision and omentectomy. Specimen Integrity: Intact. Tumor Site: Right fallopian tube. Ovarian Surface Involvement (required only if applicable): Present (left). Fallopian Tube Surface Involvement (required only if applicable): Present. Tumor Size: 1.5 cm. Histologic Type: High grade serous carcinoma. Histologic Grade: High grade. Implants (required for advanced stage serous/seromucinous borderline tumors only): Present.  Other Tissue/ Organ Involvement: Omentum, abdominal wall, left fallopian tube and ovary. Largest Extrapelvic Peritoneal Focus (required only if applicable): >2 cm. Peritoneal/Ascitic Fluid: N/A. Treatment Effect (required only for high-grade serous carcinomas): N/A. Regional Lymph Nodes: No lymph nodes submitted or found Pathologic Stage Classification (pTNM, AJCC 8th Edition): pT3c, pNX Representative Tumor Block: 3A Comment(s): None.   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   Surgeon: Thereasa Solo, MD. Specimens: Bilateral tubes / ovaries, omentum. Midline abdominal wall mass, left lateral abdominal wall mass.    Operative Findings:  Abdominal wall masses consistent with port site metastases at the umbilical incision (7cm) and left lateral incision (5cm). Omental caking in the infracolic omentum. Grossly normal very small tubes and ovaries. Normal diaphragms. No lymphadenopathy. Normal small and large intestine with the exception of miliary studding of tumor on the surface of the sigmoid colon and rectum in the cul de sac.    This represented an optimal cytoreduction (R1) with no gross visible disease remaining, however there was a thin rind of tumor on the lateral left fascia associated with the port site metastasis.     04/11/2018 Cancer Staging   Staging form: Ovary, Fallopian Tube, and Primary Peritoneal Carcinoma, AJCC 8th Edition - Pathologic: FIGO Stage IIIC (pT3, pN0, cM0) - Signed by [name], MD on 04/11/2018   05/23/2018 Imaging   1. Interval resolution of the anterior midline abdominal wall seroma. There is some persistent wispy soft tissue attenuation in the region of the midline incision, nonspecific and may simply reflect granulation tissue from  surgery. 2. Stable  13 mm exophytic lesion posterior left kidney with attenuation higher than expected for a simple cyst. This may be a cyst complicated by proteinaceous debris or hemorrhage, but attention on follow-up recommended. 3. Stable mild persistent distention of proximal jejunal loops with gradual tapering to nondilated distal small bowel. 4. No overt peritoneal or mesenteric disease identified on this exam.   05/23/2018 Tumor Marker   Patient's tumor was tested for the following markers: CA-125 Results of the tumor marker test revealed 11.6   06/02/2018 - 09/20/2018 Chemotherapy   The patient had carboplatin and taxol   06/19/2018 Procedure   Status post right IJ port catheter placement. Catheter ready for use.   06/26/2018 Tumor Marker   Patient's tumor was tested for the following markers: CA-125. Results of the tumor marker test revealed 10.7   09/20/2018 Tumor Marker   Patient's tumor was tested for the following markers: CA-125. Results of the tumor marker test revealed 9.1   10/25/2018 Imaging   1. There is suspicious, abnormal asymmetric increased soft tissue involving the cecum and ascending colon. This is suspicious for residual/progressive serosal involvement by tumor. 2. No additional sites of disease identified. No evidence for nodal metastasis, solid organ metastasis or ascites.     11/07/2018 Procedure   She had negative colonoscopy evaluation   12/05/2018 Tumor Marker   Patient's tumor was tested for the following markers: CA-125 Results of the tumor marker test revealed 8.6   01/25/2019 Imaging   Ct abdomen and pelvis Signs of omentectomy and hysterectomy without definitive signs of residual recurrent disease. Small nodular focus bridging rectum and vagina is stable dating back January to 12/14/2018, potentially postoperative but suggest attention on subsequent imaging.   01/25/2019 Tumor Marker   Patient's tumor was tested for the following markers: CA-125  Results of the tumor marker test revealed 10.3   03/15/2019 Tumor Marker   Patient's tumor was tested for the following markers: CA-125 Results of the tumor marker test revealed 9.6   03/23/2019 Imaging   No acute findings in the abdomen or pelvis.   Colonic diverticulosis.  No active diverticulitis.   Prior open tectum E and hysterectomy.   Aortic atherosclerosis.     05/10/2019 Tumor Marker   Patient's tumor was tested for the following markers: CA-125 Results of the tumor marker test revealed 11.8   05/22/2019 Imaging   1. Stable exam. No evidence of recurrent or metastatic carcinoma within the abdomen or pelvis. 2. Colonic diverticulosis, without radiographic evidence of diverticulitis.     11/08/2019 Imaging   1. Status post hysterectomy and bilateral oophorectomy. 2. Soft tissue thickening within the deep pelvis which when compared to multiple prior exams is felt to be slowly progressive. Especially given the elevated CA 125 level, this is suspicious for peritoneal recurrence. Potential imaging strategies include PET (which may be of low sensitivity secondary to the lack of well-defined dominant mass) or pelvic CT follow-up at 3 months. 3. Otherwise, no evidence of metastatic disease within the chest, abdomen, or pelvis. 4. Coronary artery atherosclerosis. Aortic Atherosclerosis (ICD10-I70.0). 5. Ventral abdominal wall hernia containing nonobstructive transverse colon, as before. 6.  Possible constipation.   12/21/2019 Tumor Marker   Patient's tumor was tested for the following markers: 95.2. Results of the tumor marker test revealed CA-`125.   01/17/2020 Imaging   1. Similar appearance of nodularity in the central pelvis along the vaginal cuff. There is a particular nodular soft tissue density measuring 2.2 x 1.6 cm along the wall of the  proximal rectum that is slightly more conspicuous than previously, raising concern for possible minimal progression. Similar appearance of  the other areas of nodular soft tissue in the central pelvis. PET-CT may prove helpful to further evaluate. 2. No ascites. 3. Left colonic diverticulosis without diverticulitis. 4. 1.7 cm exophytic lesion upper pole left kidney with well-defined homogeneous attenuation slightly higher than would be expected for simple fluid. This is probably a complex cyst. Attention on follow-up recommended. 5. Aortic Atherosclerosis (ICD10-I70.0).   01/17/2020 Tumor Marker   Patient's tumor was tested for the following markers: CA-125 Results of the tumor marker test revealed 135   01/30/2020 Echocardiogram   1. Left ventricular ejection fraction, by estimation, is 55 to 60%. Left ventricular ejection fraction by PLAX is 55 %. The left ventricle has normal function. The left ventricle has no regional wall motion abnormalities. Left ventricular diastolic parameters are indeterminate.  2. Right ventricular systolic function is normal. The right ventricular size is normal.  3. Left atrial size was borderline dilated.  4. The mitral valve is grossly normal. Trivial mitral valve regurgitation.  5. The aortic valve was not well visualized. Aortic valve regurgitation is trivial.  6. The inferior vena cava is normal in size with greater than 50% respiratory variability, suggesting right atrial pressure of 3 mmHg.   02/04/2020 -  Chemotherapy   The patient had carboplatin and doxil for chemotherapy treatment.     03/03/2020 Tumor Marker   Patient's tumor was tested for the following markers: CA-125 Results of the tumor marker test revealed 272   03/07/2020 Procedure   1. Aspiration of left lower quadrant fluid collection yields 5 mL simple straw-colored serous fluid. 2. As the fluid appears to be simple in nature, no drainage catheter was placed. 3. The aspirated fluid was sent 4 body fluid culture to confirm its sterility.   03/07/2020 Imaging   VQ scan No perfusion defects evident. No findings indicative of  pulmonary embolus.   03/07/2020 Imaging   Outside CT Multiple serosal implants again noted within the pelvis. However, increasing peritoneal nodularity and peritoneal thickening noted within the flanks and right upper quadrant as well as developing trace ascites within the mesentery all suspicious for progressive metastatic disease. Correlation with tumor markers may be helpful for further management.   Interval development of a 3.2 cm fluid collection within the left lower quadrant. Given its relatively rapid development, and alternative etiology such as a diverticular abscess should be considered. Recurrent disease, particular given the patient's history of serous adenocarcinoma, is not definitively excluded     REVIEW OF SYSTEMS:   Constitutional: Denies fevers, chills or abnormal weight loss Eyes: Denies blurriness of vision Ears, nose, mouth, throat, and face: Denies mucositis or sore throat Respiratory: Denies cough, dyspnea or wheezes Cardiovascular: Denies palpitation, chest discomfort or lower extremity swelling Gastrointestinal:  Denies nausea, heartburn or change in bowel habits Skin: Denies abnormal skin rashes Lymphatics: Denies new lymphadenopathy or easy bruising Neurological:Denies numbness, tingling or new weaknesses Behavioral/Psych: Mood is stable, no new changes  All other systems were reviewed with the patient and are negative.  I have reviewed the past medical history, past surgical history, social history and family history with the patient and they are unchanged from previous note.  ALLERGIES:  is allergic to ivp dye [iodinated diagnostic agents], propoxyphene, codeine, and ultram [tramadol hcl].  MEDICATIONS:  Current Outpatient Medications  Medication Sig Dispense Refill  . acetaminophen (TYLENOL) 325 MG tablet Take 650 mg by mouth every 6 (  six) hours as needed for mild pain, fever or headache.    . Ascorbic Acid (VITAMIN C PO) Take 1 tablet by mouth daily.     . Biotin 1 MG CAPS Take 1 mg by mouth daily.     . carboxymethylcellulose (REFRESH PLUS) 0.5 % SOLN Place 1 drop into both eyes 3 (three) times daily as needed (for dry eyes.).    Marland Kitchen Cholecalciferol (VITAMIN D3 SUPER STRENGTH) 50 MCG (2000 UT) TABS Take 2,000 Units by mouth daily.    Marland Kitchen HYDROmorphone (DILAUDID) 2 MG tablet Take 1 tablet (2 mg total) by mouth every 4 (four) hours as needed for severe pain. 60 tablet 0  . lidocaine-prilocaine (EMLA) cream Apply 1 application topically as needed. Port access    . ondansetron (ZOFRAN) 8 MG tablet TAKE 1 TABLET (8 MG TOTAL) BY MOUTH EVERY 8 (EIGHT) HOURS AS NEEDED FOR REFRACTORY NAUSEA / VOMITING. START ON DAY 3 AFTER CHEMO. (Patient taking differently: Take 8 mg by mouth every 8 (eight) hours as needed for nausea or vomiting. ) 30 tablet 1  . polyethylene glycol (MIRALAX / GLYCOLAX) 17 g packet Take 17 g by mouth daily. 14 each 0  . prochlorperazine (COMPAZINE) 10 MG tablet Take 1 tablet (10 mg total) by mouth every 6 (six) hours as needed (Nausea or vomiting). 30 tablet 1  . senna-docusate (SENOKOT-S) 8.6-50 MG tablet Take 1 tablet by mouth at bedtime. 30 tablet 0   No current facility-administered medications for this visit.   Facility-Administered Medications Ordered in Other Visits  Medication Dose Route Frequency Provider Last Rate Last Admin  . CARBOplatin (PARAPLATIN) 390 mg in sodium chloride 0.9 % 250 mL chemo infusion  390 mg Intravenous Once Alvy Bimler, Mairely Foxworth, MD      . DOXOrubicin HCL LIPOSOMAL (DOXIL) 50 mg in dextrose 5 % 250 mL chemo infusion  28 mg/m2 (Treatment Plan Recorded) Intravenous Once Alvy Bimler, Riordan Walle, MD      . fosaprepitant (EMEND) 150 mg in sodium chloride 0.9 % 145 mL IVPB  150 mg Intravenous Once [name], MD 450 mL/hr at 03/31/20 1417 150 mg at 03/31/20 1417  . heparin lock flush 100 unit/mL  500 Units Intracatheter Once PRN Alvy Bimler, Marketia Stallsmith, MD      . palonosetron (ALOXI) injection 0.25 mg  0.25 mg Intravenous Once Danyale Ridinger, MD       . sodium chloride flush (NS) 0.9 % injection 10 mL  10 mL Intracatheter PRN Alvy Bimler, Gallatin Carmack, MD        PHYSICAL EXAMINATION: ECOG PERFORMANCE STATUS: 0 - Asymptomatic  Vitals:   03/31/20 1310  BP: 137/82  Pulse: 86  Resp: 18  Temp: 97.9 F (36.6 C)  SpO2: 99%   Filed Weights   03/31/20 1310  Weight: 151 lb 3.2 oz (68.6 kg)    GENERAL:alert, no distress and comfortable SKIN: skin color, texture, turgor are normal, no rashes or significant lesions EYES: normal, Conjunctiva are pink and non-injected, sclera clear OROPHARYNX:no exudate, no erythema and lips, buccal mucosa, and tongue normal  NECK: supple, thyroid normal size, non-tender, without nodularity LYMPH:  no palpable lymphadenopathy in the cervical, axillary or inguinal LUNGS: clear to auscultation and percussion with normal breathing effort HEART: regular rate & rhythm and no murmurs and no lower extremity edema ABDOMEN:abdomen soft, non-tender and normal bowel sounds Musculoskeletal:no cyanosis of digits and no clubbing  NEURO: alert & oriented x 3 with fluent speech, no focal motor/sensory deficits  LABORATORY DATA:  I have reviewed the data as listed  Component Value Date/Time   NA 141 03/31/2020 1250   K 3.8 03/31/2020 1250   CL 107 03/31/2020 1250   CO2 25 03/31/2020 1250   GLUCOSE 94 03/31/2020 1250   BUN 12 03/31/2020 1250   CREATININE 0.74 03/31/2020 1250   CALCIUM 9.8 03/31/2020 1250   PROT 7.1 03/31/2020 1250   ALBUMIN 3.8 03/31/2020 1250   AST 17 03/31/2020 1250   ALT 15 03/31/2020 1250   ALKPHOS 60 03/31/2020 1250   BILITOT 0.4 03/31/2020 1250   GFRNONAA >60 03/31/2020 1250   GFRAA >60 01/17/2020 1339    No results found for: SPEP, UPEP  Lab Results  Component Value Date   WBC 8.8 03/31/2020   NEUTROABS 4.5 03/31/2020   HGB 12.7 03/31/2020   HCT 38.6 03/31/2020   MCV 87.3 03/31/2020   PLT 193 03/31/2020      Chemistry      Component Value Date/Time   NA 141 03/31/2020 1250   K  3.8 03/31/2020 1250   CL 107 03/31/2020 1250   CO2 25 03/31/2020 1250   BUN 12 03/31/2020 1250   CREATININE 0.74 03/31/2020 1250      Component Value Date/Time   CALCIUM 9.8 03/31/2020 1250   ALKPHOS 60 03/31/2020 1250   AST 17 03/31/2020 1250   ALT 15 03/31/2020 1250   BILITOT 0.4 03/31/2020 1250

## 2020-03-31 NOTE — Assessment & Plan Note (Signed)
She denies recent constipation but is made aware of risks of constipation while on pain medicine She will continue to take laxatives

## 2020-03-31 NOTE — Patient Instructions (Signed)
[place] Discharge Instructions for Patients Receiving Chemotherapy  Today you received the following chemotherapy agents: liposomal doxorubicin and carboplatin.  To help prevent nausea and vomiting after your treatment, we encourage you to take your nausea medication as directed.   If you develop nausea and vomiting that is not controlled by your nausea medication, call the clinic.   BELOW ARE SYMPTOMS THAT SHOULD BE REPORTED IMMEDIATELY:  *FEVER GREATER THAN 100.5 F  *CHILLS WITH OR WITHOUT FEVER  NAUSEA AND VOMITING THAT IS NOT CONTROLLED WITH YOUR NAUSEA MEDICATION  *UNUSUAL SHORTNESS OF BREATH  *UNUSUAL BRUISING OR BLEEDING  TENDERNESS IN MOUTH AND THROAT WITH OR WITHOUT PRESENCE OF ULCERS  *URINARY PROBLEMS  *BOWEL PROBLEMS  UNUSUAL RASH Items with * indicate a potential emergency and should be followed up as soon as possible.  Feel free to call the clinic should you have any questions or concerns. The clinic phone number is [phone].  Please show the [place] at check-in to the Emergency Department and triage nurse.

## 2020-03-31 NOTE — Assessment & Plan Note (Signed)
Her symptoms are almost back to normal since last time I saw her She will continue to take intermittent pain medicine as needed I plan to repeat imaging study at the end of January We will proceed with chemotherapy without delay She is in agreement with the plan of care

## 2020-03-31 NOTE — Progress Notes (Signed)
Ok to infuse Doxil 50 mg over 60 min.  Kennith Center, Pharm.D., CPP 03/31/2020@2 :27 PM

## 2020-04-01 ENCOUNTER — Telehealth: Payer: Self-pay

## 2020-04-01 LAB — CA 125: Cancer Antigen (CA) 125: 129 U/mL — ABNORMAL HIGH (ref 0.0–38.1)

## 2020-04-01 NOTE — Telephone Encounter (Signed)
Called and left below message. Ask her to call the office for questions. ?

## 2020-04-01 NOTE — Telephone Encounter (Signed)
-----   Message from [name], MD sent at 04/01/2020 10:10 AM EST ----- Regarding: pls let her know CA-125 is better

## 2020-04-29 ENCOUNTER — Inpatient Hospital Stay (HOSPITAL_BASED_OUTPATIENT_CLINIC_OR_DEPARTMENT_OTHER): Payer: Medicare HMO | Admitting: Hematology and Oncology

## 2020-04-29 ENCOUNTER — Encounter: Payer: Self-pay | Admitting: Hematology and Oncology

## 2020-04-29 ENCOUNTER — Inpatient Hospital Stay: Payer: Medicare HMO

## 2020-04-29 ENCOUNTER — Other Ambulatory Visit: Payer: Self-pay

## 2020-04-29 ENCOUNTER — Other Ambulatory Visit: Payer: Self-pay | Admitting: Hematology and Oncology

## 2020-04-29 ENCOUNTER — Inpatient Hospital Stay: Payer: Medicare HMO | Attending: Hematology and Oncology

## 2020-04-29 DIAGNOSIS — C561 Malignant neoplasm of right ovary: Secondary | ICD-10-CM | POA: Insufficient documentation

## 2020-04-29 DIAGNOSIS — Z5111 Encounter for antineoplastic chemotherapy: Secondary | ICD-10-CM | POA: Diagnosis not present

## 2020-04-29 DIAGNOSIS — Z79899 Other long term (current) drug therapy: Secondary | ICD-10-CM | POA: Diagnosis not present

## 2020-04-29 DIAGNOSIS — C569 Malignant neoplasm of unspecified ovary: Secondary | ICD-10-CM

## 2020-04-29 DIAGNOSIS — G893 Neoplasm related pain (acute) (chronic): Secondary | ICD-10-CM | POA: Diagnosis not present

## 2020-04-29 DIAGNOSIS — Z885 Allergy status to narcotic agent status: Secondary | ICD-10-CM | POA: Insufficient documentation

## 2020-04-29 DIAGNOSIS — Z7189 Other specified counseling: Secondary | ICD-10-CM

## 2020-04-29 DIAGNOSIS — I351 Nonrheumatic aortic (valve) insufficiency: Secondary | ICD-10-CM | POA: Insufficient documentation

## 2020-04-29 DIAGNOSIS — T451X5A Adverse effect of antineoplastic and immunosuppressive drugs, initial encounter: Secondary | ICD-10-CM | POA: Diagnosis not present

## 2020-04-29 DIAGNOSIS — L271 Localized skin eruption due to drugs and medicaments taken internally: Secondary | ICD-10-CM

## 2020-04-29 DIAGNOSIS — C5701 Malignant neoplasm of right fallopian tube: Secondary | ICD-10-CM

## 2020-04-29 LAB — CBC WITH DIFFERENTIAL (CANCER CENTER ONLY)
Abs Immature Granulocytes: 0.08 10*3/uL — ABNORMAL HIGH (ref 0.00–0.07)
Basophils Absolute: 0 10*3/uL (ref 0.0–0.1)
Basophils Relative: 1 %
Eosinophils Absolute: 0.1 10*3/uL (ref 0.0–0.5)
Eosinophils Relative: 2 %
HCT: 37.2 % (ref 36.0–46.0)
Hemoglobin: 12 g/dL (ref 12.0–15.0)
Immature Granulocytes: 1 %
Lymphocytes Relative: 35 %
Lymphs Abs: 2.6 10*3/uL (ref 0.7–4.0)
MCH: 29.3 pg (ref 26.0–34.0)
MCHC: 32.3 g/dL (ref 30.0–36.0)
MCV: 90.7 fL (ref 80.0–100.0)
Monocytes Absolute: 0.9 10*3/uL (ref 0.1–1.0)
Monocytes Relative: 12 %
Neutro Abs: 3.7 10*3/uL (ref 1.7–7.7)
Neutrophils Relative %: 49 %
Platelet Count: 167 10*3/uL (ref 150–400)
RBC: 4.1 MIL/uL (ref 3.87–5.11)
RDW: 16.5 % — ABNORMAL HIGH (ref 11.5–15.5)
WBC Count: 7.4 10*3/uL (ref 4.0–10.5)
nRBC: 0 % (ref 0.0–0.2)

## 2020-04-29 LAB — CMP (CANCER CENTER ONLY)
ALT: 16 U/L (ref 0–44)
AST: 17 U/L (ref 15–41)
Albumin: 3.7 g/dL (ref 3.5–5.0)
Alkaline Phosphatase: 55 U/L (ref 38–126)
Anion gap: 10 (ref 5–15)
BUN: 15 mg/dL (ref 8–23)
CO2: 25 mmol/L (ref 22–32)
Calcium: 9.2 mg/dL (ref 8.9–10.3)
Chloride: 105 mmol/L (ref 98–111)
Creatinine: 0.78 mg/dL (ref 0.44–1.00)
GFR, Estimated: >60 mL/min (ref >60)
Glucose, Bld: 166 mg/dL — ABNORMAL HIGH (ref 70–99)
Potassium: 3.5 mmol/L (ref 3.5–5.1)
Sodium: 140 mmol/L (ref 135–145)
Total Bilirubin: 0.4 mg/dL (ref 0.3–1.2)
Total Protein: 6.8 g/dL (ref 6.5–8.1)

## 2020-04-29 MED ORDER — PALONOSETRON HCL INJECTION 0.25 MG/5ML
0.2500 mg | Freq: Once | INTRAVENOUS | Status: AC
  Administered 2020-04-29: 0.25 mg via INTRAVENOUS

## 2020-04-29 MED ORDER — SODIUM CHLORIDE 0.9% FLUSH
10.0000 mL | INTRAVENOUS | Status: DC | PRN
  Administered 2020-04-29: 10 mL
  Filled 2020-04-29: qty 10

## 2020-04-29 MED ORDER — SODIUM CHLORIDE 0.9 % IV SOLN
389.5000 mg | Freq: Once | INTRAVENOUS | Status: AC
  Administered 2020-04-29: 390 mg via INTRAVENOUS
  Filled 2020-04-29: qty 39

## 2020-04-29 MED ORDER — DEXTROSE 5 % IV SOLN
Freq: Once | INTRAVENOUS | Status: AC
  Filled 2020-04-29: qty 250

## 2020-04-29 MED ORDER — FAMOTIDINE IN NACL 20-0.9 MG/50ML-% IV SOLN
20.0000 mg | Freq: Once | INTRAVENOUS | Status: AC
  Administered 2020-04-29: 20 mg via INTRAVENOUS

## 2020-04-29 MED ORDER — DOXORUBICIN HCL LIPOSOMAL CHEMO INJECTION 2 MG/ML
28.0000 mg/m2 | Freq: Once | INTRAVENOUS | Status: AC
  Administered 2020-04-29: 50 mg via INTRAVENOUS
  Filled 2020-04-29: qty 25

## 2020-04-29 MED ORDER — DIPHENHYDRAMINE HCL 50 MG/ML IJ SOLN
25.0000 mg | Freq: Once | INTRAMUSCULAR | Status: AC
  Administered 2020-04-29: 25 mg via INTRAVENOUS

## 2020-04-29 MED ORDER — FAMOTIDINE IN NACL 20-0.9 MG/50ML-% IV SOLN
INTRAVENOUS | Status: AC
  Filled 2020-04-29: qty 50

## 2020-04-29 MED ORDER — HEPARIN SOD (PORK) LOCK FLUSH 100 UNIT/ML IV SOLN
500.0000 [IU] | Freq: Once | INTRAVENOUS | Status: AC | PRN
  Administered 2020-04-29: 500 [IU]
  Filled 2020-04-29: qty 5

## 2020-04-29 MED ORDER — SODIUM CHLORIDE 0.9 % IV SOLN
150.0000 mg | Freq: Once | INTRAVENOUS | Status: AC
  Administered 2020-04-29: 150 mg via INTRAVENOUS
  Filled 2020-04-29: qty 150

## 2020-04-29 MED ORDER — PALONOSETRON HCL INJECTION 0.25 MG/5ML
INTRAVENOUS | Status: AC
  Filled 2020-04-29: qty 5

## 2020-04-29 MED ORDER — DIPHENHYDRAMINE HCL 50 MG/ML IJ SOLN
INTRAMUSCULAR | Status: AC
  Filled 2020-04-29: qty 1

## 2020-04-29 MED ORDER — SODIUM CHLORIDE 0.9% FLUSH
10.0000 mL | Freq: Once | INTRAVENOUS | Status: AC
  Administered 2020-04-29: 10 mL
  Filled 2020-04-29: qty 10

## 2020-04-29 MED ORDER — SODIUM CHLORIDE 0.9 % IV SOLN
10.0000 mg | Freq: Once | INTRAVENOUS | Status: AC
  Administered 2020-04-29: 10 mg via INTRAVENOUS
  Filled 2020-04-29: qty 10

## 2020-04-29 NOTE — Patient Instructions (Signed)
[facility] Discharge Instructions for Patients Receiving Chemotherapy  Today you received the following chemotherapy agents: liposomal doxorubicin and carboplatin.  To help prevent nausea and vomiting after your treatment, we encourage you to take your nausea medication as directed.   If you develop nausea and vomiting that is not controlled by your nausea medication, call the clinic.   BELOW ARE SYMPTOMS THAT SHOULD BE REPORTED IMMEDIATELY:  *FEVER GREATER THAN 100.5 F  *CHILLS WITH OR WITHOUT FEVER  NAUSEA AND VOMITING THAT IS NOT CONTROLLED WITH YOUR NAUSEA MEDICATION  *UNUSUAL SHORTNESS OF BREATH  *UNUSUAL BRUISING OR BLEEDING  TENDERNESS IN MOUTH AND THROAT WITH OR WITHOUT PRESENCE OF ULCERS  *URINARY PROBLEMS  *BOWEL PROBLEMS  UNUSUAL RASH Items with * indicate a potential emergency and should be followed up as soon as possible.  Feel free to call the clinic should you have any questions or concerns. The clinic phone number is [phone].  Please show the CHEMO ALERT CARD at check-in to the Emergency Department and triage nurse.   

## 2020-04-29 NOTE — Assessment & Plan Note (Signed)
She has vague intermittent abdominal pain and has undergone extensive evaluation in November I recommend she continues on aggressive laxative therapy and to take pain medicine as needed

## 2020-04-29 NOTE — Progress Notes (Signed)
Patient with >7 doses of carboplatin and additional premedications are needed.  Added: Diphenhydramine 25 mg IV (due to age) x 1 for premedication and famotidine 20 mg IVPB x 1 for premedication.  Orders updated for all future plans.  Pryor Ochoa, PharmD

## 2020-04-29 NOTE — Assessment & Plan Note (Signed)
She had mild skin peeling most consistent with hand-foot syndrome from Doxil I recommend conservative approach for now

## 2020-04-29 NOTE — Progress Notes (Signed)
[facility] OFFICE PROGRESS NOTE  Patient Care Team: Allie Dimmer, MD as PCP - General (Internal Medicine) [name], RN as Oncology Nurse Navigator (Oncology)  ASSESSMENT & PLAN:  Fallopian tube cancer, carcinoma, right ([place]) Overall, I believe she tolerated treatment well Her recent tumor marker was trending down We discussed the risk and benefits of pursuing 1 more cycles of treatment before repeating imaging study Ultimately, we decide to proceed with treatment as scheduled today  If her tumor marker result show favorable response to treatment tomorrow, we will schedule I more cycle of treatment before repeating CT imaging However, if her tumor marker trends up significantly, I will order CT imaging within 2 weeks She would need repeat echocardiogram in the near future  Hand foot syndrome She had mild skin peeling most consistent with hand-foot syndrome from Doxil I recommend conservative approach for now  Cancer associated pain She has vague intermittent abdominal pain and has undergone extensive evaluation in November I recommend she continues on aggressive laxative therapy and to take pain medicine as needed   No orders of the defined types were placed in this encounter.   All questions were answered. The patient knows to call the clinic with any problems, questions or concerns. The total time spent in the appointment was 20 minutes encounter with patients including review of chart and various tests results, discussions about plan of care and coordination of care plan   [name], MD 04/29/2020 3:05 PM  INTERVAL HISTORY: Please see below for problem oriented charting. She returns for chemotherapy and follow-up Since last time I saw her, she feels okay except for mild intermittent abdominal discomfort Her discomfort is not severe enough to warrant prescribed pain medicine She denies recent nausea or changes in bowel habits Her appetite is fair No  recent infection, fever or chills She also noted some mild skin peeling affecting predominantly the fingers but not the toes She has been putting on moisturizing cream  SUMMARY OF ONCOLOGIC HISTORY: Oncology History Overview Note  High grade serous, right fallopian tube  HRD, BRCA tumor testing were neg   Ovarian cancer ([place])  03/21/2018 Initial Diagnosis   Ovarian cancer ([place])   02/04/2020 -  Chemotherapy   The patient had carboplatin and doxil for chemotherapy treatment.     Fallopian tube cancer, carcinoma, right ([place])  10/24/2017 Initial Diagnosis   She has presentation of vague abdominal pain   02/20/2018 Imaging   Outside CT abdomen and pelvis: This revealed an incidental finding of a 1.5 cm subcapsular lesion in the lateral upper pole of the left kidney which measured higher than fluid attenuation which could represent a proteinaceous renal cyst or solid renal mass.  There was no ascites.  There is no lymphadenopathy.  There were no masses in the pelvis including ovarian or adnexal masses seen.  There was however soft tissue stranding seen with nodularity in the omental fat in the lower abdomen without evidence of ascites.  This was felt to represent either carcinomatosis or inflammatory infectious etiologies.  There was colonic diverticulosis seen without radiographic evidence of diverticulitis.  The radiologist recommended MRI of the abdomen to further work-up the renal mass   02/22/2018 Tumor Marker   Patient's tumor was tested for the following markers: CA-125. Results of the tumor marker test revealed 103.   03/06/2018 Pathology Results   Omentum, biopsy - ADENOCARCINOMA WITH PSAMMOMA BODIES, SEE COMMENT. Microscopic Comment There are scattered small foci of malignant glands with associated psammoma  bodies. While the tumor is somewhat limited the cells have a more low grade appearance. There is prominent lymphovascular invasion. Immunohistochemistry reveals the cells are  positive for cytokeratin 7, ER, MOC31, PAX8, and WT-1. They are negative for calretinin, cytokeratin 20, CDX-2, and TTF-1. Overall, the findings are consistent with adenocarcinoma. The differential includes a primary gynecologic or peritoneal tumor, although the morphology is not typical of a high grade serous carcinoma.   03/06/2018 Surgery   Surgeon: Donaciano Eva   Pre-operative Diagnosis: omental mass, elevated CA 125 Operation: Laparoscopic omentectomy  Surgeon: Donaciano Eva  Operative Findings:  : normal diaphragm and upper omentum. Sigmoid colon densely adherent to left pelvic side wall consistent with history of diverticulitis. Omentum adherent to anterior abdominal wall and sigmoid colon. Surgically absent uterus. Right ovary very small and grossly normal. Left ovary obscured by adhesed sigmoid colon. No ascites. No carcinomatosis. There was a nodular firm distal omentum on the left which was adherent to the sigmoid colon and most consistent with inflammatory change.      03/21/2018 Pathology Results   1. Soft tissue mass, simple excision, midline abdominal wall mass - METASTATIC SEROUS CARCINOMA. 2. Omentum, resection for tumor - METASTATIC SEROUS CARCINOMA. 3. Adnexa - ovary +/- tube, neoplastic, right - RIGHT FALLOPIAN TUBE: -HIGH GRADE SEROUS CARCINOMA. -SEE ONCOLOGY TABLE. - RIGHT OVARY: SURFACE PSAMMOMA BODIES. NO DEFINITIVE MALIGNANT CELLS. -INCLUSION CYSTS. 4. Adnexa - ovary +/- tube, neoplastic, left - LEFT FALLOPIAN TUBE: LUMINAL AND SURFACE SEROUS CARCINOMA. - LEFT OVARY: FOCAL PSAMMOMA BODIES AND MALIGNANT CELLS. 5. Soft tissue mass, simple excision, left abdominal wall - METASTATIC SEROUS CARCINOMA. Microscopic Comment 3. OVARY or FALLOPIAN TUBE or PRIMARY PERITONEUM: Procedure: Bilateral salpingo-oophorectomy. Abdominal wall mass excision and omentectomy. Specimen Integrity: Intact. Tumor Site: Right fallopian tube. Ovarian Surface  Involvement (required only if applicable): Present (left). Fallopian Tube Surface Involvement (required only if applicable): Present. Tumor Size: 1.5 cm. Histologic Type: High grade serous carcinoma. Histologic Grade: High grade. Implants (required for advanced stage serous/seromucinous borderline tumors only): Present. Other Tissue/ Organ Involvement: Omentum, abdominal wall, left fallopian tube and ovary. Largest Extrapelvic Peritoneal Focus (required only if applicable): >2 cm. Peritoneal/Ascitic Fluid: N/A. Treatment Effect (required only for high-grade serous carcinomas): N/A. Regional Lymph Nodes: No lymph nodes submitted or found Pathologic Stage Classification (pTNM, AJCC 8th Edition): pT3c, pNX Representative Tumor Block: 3A Comment(s): None.   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   Surgeon: Thereasa Solo, MD. Specimens: Bilateral tubes / ovaries, omentum. Midline abdominal wall mass, left lateral abdominal wall mass.    Operative Findings:  Abdominal wall masses consistent with port site metastases at the umbilical incision (7cm) and left lateral incision (5cm). Omental caking in the infracolic omentum. Grossly normal very small tubes and ovaries. Normal diaphragms. No lymphadenopathy. Normal small and large intestine with the exception of miliary studding of tumor on the surface of the sigmoid colon and rectum in the cul de sac.    This represented an optimal cytoreduction (R1) with no gross visible disease remaining, however there was a thin rind of tumor on the lateral left fascia associated with the port site metastasis.     04/11/2018 Cancer Staging   Staging form: Ovary, Fallopian Tube, and Primary Peritoneal Carcinoma, AJCC 8th Edition -  Pathologic: FIGO Stage IIIC (pT3, pN0, cM0) - Signed by  [name], MD on 04/11/2018   05/23/2018 Imaging   1. Interval resolution of the anterior midline abdominal wall seroma. There is some persistent wispy soft tissue attenuation in the region of the midline incision, nonspecific and may simply reflect granulation tissue from surgery. 2. Stable 13 mm exophytic lesion posterior left kidney with attenuation higher than expected for a simple cyst. This may be a cyst complicated by proteinaceous debris or hemorrhage, but attention on follow-up recommended. 3. Stable mild persistent distention of proximal jejunal loops with gradual tapering to nondilated distal small bowel. 4. No overt peritoneal or mesenteric disease identified on this exam.   05/23/2018 Tumor Marker   Patient's tumor was tested for the following markers: CA-125 Results of the tumor marker test revealed 11.6   06/02/2018 - 09/20/2018 Chemotherapy   The patient had carboplatin and taxol   06/19/2018 Procedure   Status post right IJ port catheter placement. Catheter ready for use.   06/26/2018 Tumor Marker   Patient's tumor was tested for the following markers: CA-125. Results of the tumor marker test revealed 10.7   09/20/2018 Tumor Marker   Patient's tumor was tested for the following markers: CA-125. Results of the tumor marker test revealed 9.1   10/25/2018 Imaging   1. There is suspicious, abnormal asymmetric increased soft tissue involving the cecum and ascending colon. This is suspicious for residual/progressive serosal involvement by tumor. 2. No additional sites of disease identified. No evidence for nodal metastasis, solid organ metastasis or ascites.     11/07/2018 Procedure   She had negative colonoscopy evaluation   12/05/2018 Tumor Marker   Patient's tumor was tested for the following markers: CA-125 Results of the tumor marker test revealed 8.6   01/25/2019 Imaging   Ct abdomen and pelvis Signs of omentectomy  and hysterectomy without definitive signs of residual recurrent disease. Small nodular focus bridging rectum and vagina is stable dating back January to 12/14/2018, potentially postoperative but suggest attention on subsequent imaging.   01/25/2019 Tumor Marker   Patient's tumor was tested for the following markers: CA-125 Results of the tumor marker test revealed 10.3   03/15/2019 Tumor Marker   Patient's tumor was tested for the following markers: CA-125 Results of the tumor marker test revealed 9.6   03/23/2019 Imaging   No acute findings in the abdomen or pelvis.   Colonic diverticulosis.  No active diverticulitis.   Prior open tectum E and hysterectomy.   Aortic atherosclerosis.     05/10/2019 Tumor Marker   Patient's tumor was tested for the following markers: CA-125 Results of the tumor marker test revealed 11.8   05/22/2019 Imaging   1. Stable exam. No evidence of recurrent or metastatic carcinoma within the abdomen or pelvis. 2. Colonic diverticulosis, without radiographic evidence of diverticulitis.     11/08/2019 Imaging   1. Status post hysterectomy and bilateral oophorectomy. 2. Soft tissue thickening within the deep pelvis which when compared to multiple prior exams is felt to be slowly progressive. Especially given the elevated CA 125 level, this is suspicious for peritoneal recurrence. Potential imaging strategies include PET (which may be of low sensitivity secondary to the lack of well-defined dominant mass) or pelvic CT follow-up at 3 months. 3. Otherwise, no evidence of metastatic disease within the chest, abdomen, or pelvis. 4. Coronary artery atherosclerosis. Aortic Atherosclerosis (ICD10-I70.0). 5. Ventral abdominal wall hernia containing nonobstructive transverse colon, as before. 6.  Possible constipation.   12/21/2019 Tumor Marker   Patient's tumor was tested for the following  markers: 95.2. Results of the tumor marker test revealed CA-`125.   01/17/2020  Imaging   1. Similar appearance of nodularity in the central pelvis along the vaginal cuff. There is a particular nodular soft tissue density measuring 2.2 x 1.6 cm along the wall of the proximal rectum that is slightly more conspicuous than previously, raising concern for possible minimal progression. Similar appearance of the other areas of nodular soft tissue in the central pelvis. PET-CT may prove helpful to further evaluate. 2. No ascites. 3. Left colonic diverticulosis without diverticulitis. 4. 1.7 cm exophytic lesion upper pole left kidney with well-defined homogeneous attenuation slightly higher than would be expected for simple fluid. This is probably a complex cyst. Attention on follow-up recommended. 5. Aortic Atherosclerosis (ICD10-I70.0).   01/17/2020 Tumor Marker   Patient's tumor was tested for the following markers: CA-125 Results of the tumor marker test revealed 135   01/30/2020 Echocardiogram   1. Left ventricular ejection fraction, by estimation, is 55 to 60%. Left ventricular ejection fraction by PLAX is 55 %. The left ventricle has normal function. The left ventricle has no regional wall motion abnormalities. Left ventricular diastolic parameters are indeterminate.  2. Right ventricular systolic function is normal. The right ventricular size is normal.  3. Left atrial size was borderline dilated.  4. The mitral valve is grossly normal. Trivial mitral valve regurgitation.  5. The aortic valve was not well visualized. Aortic valve regurgitation is trivial.  6. The inferior vena cava is normal in size with greater than 50% respiratory variability, suggesting right atrial pressure of 3 mmHg.   02/04/2020 -  Chemotherapy   The patient had carboplatin and doxil for chemotherapy treatment.     03/03/2020 Tumor Marker   Patient's tumor was tested for the following markers: CA-125 Results of the tumor marker test revealed 272   03/07/2020 Procedure   1. Aspiration of left lower  quadrant fluid collection yields 5 mL simple straw-colored serous fluid. 2. As the fluid appears to be simple in nature, no drainage catheter was placed. 3. The aspirated fluid was sent 4 body fluid culture to confirm its sterility.   03/07/2020 Imaging   VQ scan No perfusion defects evident. No findings indicative of pulmonary embolus.   03/07/2020 Imaging   Outside CT Multiple serosal implants again noted within the pelvis. However, increasing peritoneal nodularity and peritoneal thickening noted within the flanks and right upper quadrant as well as developing trace ascites within the mesentery all suspicious for progressive metastatic disease. Correlation with tumor markers may be helpful for further management.   Interval development of a 3.2 cm fluid collection within the left lower quadrant. Given its relatively rapid development, and alternative etiology such as a diverticular abscess should be considered. Recurrent disease, particular given the patient's history of serous adenocarcinoma, is not definitively excluded   03/31/2020 Tumor Marker   Patient's tumor was tested for the following markers: CA-125. Results of the tumor marker test revealed 129     REVIEW OF SYSTEMS:   Constitutional: Denies fevers, chills or abnormal weight loss Eyes: Denies blurriness of vision Ears, nose, mouth, throat, and face: Denies mucositis or sore throat Respiratory: Denies cough, dyspnea or wheezes Cardiovascular: Denies palpitation, chest discomfort or lower extremity swelling Gastrointestinal:  Denies nausea, heartburn or change in bowel habits Lymphatics: Denies new lymphadenopathy or easy bruising Neurological:Denies numbness, tingling or new weaknesses Behavioral/Psych: Mood is stable, no new changes  All other systems were reviewed with the patient and are negative.  I  have reviewed the past medical history, past surgical history, social history and family history with the patient and they  are unchanged from previous note.  ALLERGIES:  is allergic to ivp dye [iodinated diagnostic agents], propoxyphene, codeine, and ultram [tramadol hcl].  MEDICATIONS:  Current Outpatient Medications  Medication Sig Dispense Refill  . acetaminophen (TYLENOL) 325 MG tablet Take 650 mg by mouth every 6 (six) hours as needed for mild pain, fever or headache.    . Ascorbic Acid (VITAMIN C PO) Take 1 tablet by mouth daily.    . Biotin 1 MG CAPS Take 1 mg by mouth daily.     . carboxymethylcellulose (REFRESH PLUS) 0.5 % SOLN Place 1 drop into both eyes 3 (three) times daily as needed (for dry eyes.).    Marland Kitchen Cholecalciferol (VITAMIN D3 SUPER STRENGTH) 50 MCG (2000 UT) TABS Take 2,000 Units by mouth daily.    Marland Kitchen HYDROmorphone (DILAUDID) 2 MG tablet Take 1 tablet (2 mg total) by mouth every 4 (four) hours as needed for severe pain. 60 tablet 0  . lidocaine-prilocaine (EMLA) cream Apply 1 application topically as needed. Port access    . ondansetron (ZOFRAN) 8 MG tablet TAKE 1 TABLET (8 MG TOTAL) BY MOUTH EVERY 8 (EIGHT) HOURS AS NEEDED FOR REFRACTORY NAUSEA / VOMITING. START ON DAY 3 AFTER CHEMO. (Patient taking differently: Take 8 mg by mouth every 8 (eight) hours as needed for nausea or vomiting. ) 30 tablet 1  . polyethylene glycol (MIRALAX / GLYCOLAX) 17 g packet Take 17 g by mouth daily. 14 each 0  . prochlorperazine (COMPAZINE) 10 MG tablet Take 1 tablet (10 mg total) by mouth every 6 (six) hours as needed (Nausea or vomiting). 30 tablet 1  . senna-docusate (SENOKOT-S) 8.6-50 MG tablet Take 1 tablet by mouth at bedtime. 30 tablet 0   No current facility-administered medications for this visit.   Facility-Administered Medications Ordered in Other Visits  Medication Dose Route Frequency Provider Last Rate Last Admin  . CARBOplatin (PARAPLATIN) 390 mg in sodium chloride 0.9 % 250 mL chemo infusion  390 mg Intravenous Once Alvy Bimler, Ni, MD      . DOXOrubicin HCL LIPOSOMAL (DOXIL) 50 mg in dextrose 5 % 250  mL chemo infusion  28 mg/m2 (Treatment Plan Recorded) Intravenous Once [name], MD 275 mL/hr at 04/29/20 1433 50 mg at 04/29/20 1433  . heparin lock flush 100 unit/mL  500 Units Intracatheter Once PRN Alvy Bimler, Ni, MD      . sodium chloride flush (NS) 0.9 % injection 10 mL  10 mL Intracatheter PRN Alvy Bimler, Ni, MD        PHYSICAL EXAMINATION: ECOG PERFORMANCE STATUS: 1 - Symptomatic but completely ambulatory  Vitals:   04/29/20 1149  BP: 140/80  Pulse: 85  Resp: 18  Temp: 97.9 F (36.6 C)  SpO2: 99%   Filed Weights   04/29/20 1149  Weight: 156 lb 12.8 oz (71.1 kg)    GENERAL:alert, no distress and comfortable SKIN: Noted dry skin and mild skin peeling EYES: normal, Conjunctiva are pink and non-injected, sclera clear OROPHARYNX:no exudate, no erythema and lips, buccal mucosa, and tongue normal  NECK: supple, thyroid normal size, non-tender, without nodularity LYMPH:  no palpable lymphadenopathy in the cervical, axillary or inguinal LUNGS: clear to auscultation and percussion with normal breathing effort HEART: regular rate & rhythm and no murmurs and no lower extremity edema ABDOMEN:abdomen soft, non-tender and normal bowel sounds Musculoskeletal:no cyanosis of digits and no clubbing  NEURO: alert & oriented  x 3 with fluent speech, no focal motor/sensory deficits  LABORATORY DATA:  I have reviewed the data as listed    Component Value Date/Time   NA 140 04/29/2020 1131   K 3.5 04/29/2020 1131   CL 105 04/29/2020 1131   CO2 25 04/29/2020 1131   GLUCOSE 166 (H) 04/29/2020 1131   BUN 15 04/29/2020 1131   CREATININE 0.78 04/29/2020 1131   CALCIUM 9.2 04/29/2020 1131   PROT 6.8 04/29/2020 1131   ALBUMIN 3.7 04/29/2020 1131   AST 17 04/29/2020 1131   ALT 16 04/29/2020 1131   ALKPHOS 55 04/29/2020 1131   BILITOT 0.4 04/29/2020 1131   GFRNONAA >60 04/29/2020 1131   GFRAA >60 01/17/2020 1339    No results found for: SPEP, UPEP  Lab Results  Component Value Date    WBC 7.4 04/29/2020   NEUTROABS 3.7 04/29/2020   HGB 12.0 04/29/2020   HCT 37.2 04/29/2020   MCV 90.7 04/29/2020   PLT 167 04/29/2020      Chemistry      Component Value Date/Time   NA 140 04/29/2020 1131   K 3.5 04/29/2020 1131   CL 105 04/29/2020 1131   CO2 25 04/29/2020 1131   BUN 15 04/29/2020 1131   CREATININE 0.78 04/29/2020 1131      Component Value Date/Time   CALCIUM 9.2 04/29/2020 1131   ALKPHOS 55 04/29/2020 1131   AST 17 04/29/2020 1131   ALT 16 04/29/2020 1131   BILITOT 0.4 04/29/2020 1131

## 2020-04-29 NOTE — Assessment & Plan Note (Signed)
Overall, I believe she tolerated treatment well Her recent tumor marker was trending down We discussed the risk and benefits of pursuing 1 more cycles of treatment before repeating imaging study Ultimately, we decide to proceed with treatment as scheduled today  If her tumor marker result show favorable response to treatment tomorrow, we will schedule I more cycle of treatment before repeating CT imaging However, if her tumor marker trends up significantly, I will order CT imaging within 2 weeks She would need repeat echocardiogram in the near future

## 2020-04-30 ENCOUNTER — Telehealth: Payer: Self-pay | Admitting: Hematology and Oncology

## 2020-04-30 ENCOUNTER — Other Ambulatory Visit: Payer: Self-pay | Admitting: Hematology and Oncology

## 2020-04-30 ENCOUNTER — Telehealth: Payer: Self-pay

## 2020-04-30 DIAGNOSIS — C5701 Malignant neoplasm of right fallopian tube: Secondary | ICD-10-CM

## 2020-04-30 LAB — CA 125: Cancer Antigen (CA) 125: 66.5 U/mL — ABNORMAL HIGH (ref 0.0–38.1)

## 2020-04-30 NOTE — Telephone Encounter (Signed)
Scheduled infusion appointment per 1/5 schedule message. Patient is aware.

## 2020-04-30 NOTE — Telephone Encounter (Signed)
Called and given below message. She verbalized understanding. Given appt for echo on 1/27 at 10 am at  [facility], arrive at 0945 to main entrance of hospital. She verbalized understanding.

## 2020-04-30 NOTE — Telephone Encounter (Signed)
-----   Message from Artis Delay, MD sent at 04/30/2020 10:44 AM EST ----- Regarding: lavbs Pls let her know CA-125 has dropped by half Plan to continue 1 more cycle before repeating CT, scheduler will call her for appt to see me and chemo around 2/1 Can you help me schedule a repeat ECHO some time before 2/1? Thanks

## 2020-05-02 ENCOUNTER — Telehealth: Payer: Self-pay | Admitting: Oncology

## 2020-05-02 MED ORDER — SENNOSIDES-DOCUSATE SODIUM 8.6-50 MG PO TABS: 2.0000 | ORAL_TABLET | Freq: Two times a day (BID) | ORAL | 3 refills | Status: DC

## 2020-05-02 MED ORDER — SENNOSIDES-DOCUSATE SODIUM 8.6-50 MG PO TABS: 1.0000 | ORAL_TABLET | Freq: Two times a day (BID) | ORAL | 3 refills | Status: DC

## 2020-05-02 NOTE — Telephone Encounter (Signed)
Misty Hopkins called and requested a refill of Senokot-S. She is aware that it is available over the counter but said the prescription form seems to work better. She would like it sent to the [facility] in [place]. Is this ok to fill?

## 2020-05-02 NOTE — Telephone Encounter (Signed)
Refill sent to [facility] and notified Vaughan Basta.

## 2020-05-02 NOTE — Telephone Encounter (Signed)
Yes, pls send for 2 pills BID PO with 3 refills, 30 days supply

## 2020-05-20 ENCOUNTER — Telehealth: Payer: Self-pay | Admitting: Oncology

## 2020-05-20 NOTE — Telephone Encounter (Signed)
Misty Hopkins called and said she has what looks like insect bites on her lower legs from her ankles to her shins that are itchy.  She has a few that have healed and have left a brown spot.  She has applied Neosporin to a few of them. She thinks the rash started about 3 weeks ago. She is wondering if this could be a side effect of chemotherapy or if she should make an appointment with her dermatologist - Dr. Renda Rolls.

## 2020-05-20 NOTE — Telephone Encounter (Signed)
Not typical for side-effects of chemo Please take pictures so I can see it next visit Please scheduled derm review

## 2020-05-20 NOTE — Telephone Encounter (Signed)
Called Misty Hopkins and advised her of message below from Dr. Alvy Bimler.  She verbalized understanding and agreement.

## 2020-05-22 ENCOUNTER — Ambulatory Visit (HOSPITAL_COMMUNITY)
Admission: RE | Admit: 2020-05-22 | Discharge: 2020-05-22 | Disposition: A | Discharge status: Home / Self Care | Payer: Medicare HMO | Source: Ambulatory Visit | Attending: Hematology and Oncology | Admitting: Hematology and Oncology

## 2020-05-22 ENCOUNTER — Other Ambulatory Visit: Payer: Self-pay

## 2020-05-22 DIAGNOSIS — Z01810 Encounter for preprocedural cardiovascular examination: Secondary | ICD-10-CM | POA: Insufficient documentation

## 2020-05-22 DIAGNOSIS — K219 Gastro-esophageal reflux disease without esophagitis: Secondary | ICD-10-CM | POA: Insufficient documentation

## 2020-05-22 DIAGNOSIS — C5701 Malignant neoplasm of right fallopian tube: Secondary | ICD-10-CM | POA: Diagnosis not present

## 2020-05-22 DIAGNOSIS — Z0189 Encounter for other specified special examinations: Secondary | ICD-10-CM

## 2020-05-22 LAB — ECHOCARDIOGRAM COMPLETE
Area-P 1/2: 3.6 cm2
Calc EF: 51.3 %
S' Lateral: 3.4 cm
Single Plane A2C EF: 55.4 %
Single Plane A4C EF: 45.2 %

## 2020-05-22 NOTE — Progress Notes (Signed)
  Echocardiogram 2D Echocardiogram has been performed.  Michiel Cowboy 05/22/2020, 10:48 AM

## 2020-05-27 ENCOUNTER — Other Ambulatory Visit: Payer: Self-pay | Admitting: Hematology and Oncology

## 2020-05-27 ENCOUNTER — Inpatient Hospital Stay: Payer: Medicare HMO

## 2020-05-27 ENCOUNTER — Other Ambulatory Visit: Payer: Self-pay

## 2020-05-27 ENCOUNTER — Inpatient Hospital Stay: Payer: Medicare HMO | Attending: Hematology and Oncology

## 2020-05-27 ENCOUNTER — Telehealth: Payer: Self-pay | Admitting: Hematology and Oncology

## 2020-05-27 ENCOUNTER — Inpatient Hospital Stay (HOSPITAL_BASED_OUTPATIENT_CLINIC_OR_DEPARTMENT_OTHER): Payer: Medicare HMO | Admitting: Hematology and Oncology

## 2020-05-27 ENCOUNTER — Encounter: Payer: Self-pay | Admitting: Hematology and Oncology

## 2020-05-27 VITALS — BP 148/82 | HR 90 | Temp 98.6°F | Resp 18 | Ht 64.0 in | Wt 155.2 lb

## 2020-05-27 DIAGNOSIS — Z79899 Other long term (current) drug therapy: Secondary | ICD-10-CM | POA: Diagnosis not present

## 2020-05-27 DIAGNOSIS — I2510 Atherosclerotic heart disease of native coronary artery without angina pectoris: Secondary | ICD-10-CM | POA: Diagnosis not present

## 2020-05-27 DIAGNOSIS — R5383 Other fatigue: Secondary | ICD-10-CM | POA: Diagnosis not present

## 2020-05-27 DIAGNOSIS — R030 Elevated blood-pressure reading, without diagnosis of hypertension: Secondary | ICD-10-CM | POA: Insufficient documentation

## 2020-05-27 DIAGNOSIS — C569 Malignant neoplasm of unspecified ovary: Secondary | ICD-10-CM

## 2020-05-27 DIAGNOSIS — C561 Malignant neoplasm of right ovary: Secondary | ICD-10-CM | POA: Diagnosis present

## 2020-05-27 DIAGNOSIS — I700 Atherosclerosis of aorta: Secondary | ICD-10-CM | POA: Diagnosis not present

## 2020-05-27 DIAGNOSIS — Z7189 Other specified counseling: Secondary | ICD-10-CM

## 2020-05-27 DIAGNOSIS — Z5111 Encounter for antineoplastic chemotherapy: Secondary | ICD-10-CM | POA: Diagnosis present

## 2020-05-27 DIAGNOSIS — R971 Elevated cancer antigen 125 [CA 125]: Secondary | ICD-10-CM | POA: Diagnosis not present

## 2020-05-27 DIAGNOSIS — C5701 Malignant neoplasm of right fallopian tube: Secondary | ICD-10-CM | POA: Diagnosis present

## 2020-05-27 LAB — CMP (CANCER CENTER ONLY)
ALT: 14 U/L (ref 0–44)
AST: 17 U/L (ref 15–41)
Albumin: 3.9 g/dL (ref 3.5–5.0)
Alkaline Phosphatase: 62 U/L (ref 38–126)
Anion gap: 8 (ref 5–15)
BUN: 11 mg/dL (ref 8–23)
CO2: 24 mmol/L (ref 22–32)
Calcium: 9.1 mg/dL (ref 8.9–10.3)
Chloride: 108 mmol/L (ref 98–111)
Creatinine: 0.75 mg/dL (ref 0.44–1.00)
GFR, Estimated: >60 mL/min (ref >60)
Glucose, Bld: 121 mg/dL — ABNORMAL HIGH (ref 70–99)
Potassium: 3.7 mmol/L (ref 3.5–5.1)
Sodium: 140 mmol/L (ref 135–145)
Total Bilirubin: 0.3 mg/dL (ref 0.3–1.2)
Total Protein: 7 g/dL (ref 6.5–8.1)

## 2020-05-27 LAB — CBC WITH DIFFERENTIAL (CANCER CENTER ONLY)
Abs Immature Granulocytes: 0.04 10*3/uL (ref 0.00–0.07)
Basophils Absolute: 0.1 10*3/uL (ref 0.0–0.1)
Basophils Relative: 1 %
Eosinophils Absolute: 0.2 10*3/uL (ref 0.0–0.5)
Eosinophils Relative: 2 %
HCT: 38 % (ref 36.0–46.0)
Hemoglobin: 12.3 g/dL (ref 12.0–15.0)
Immature Granulocytes: 1 %
Lymphocytes Relative: 39 %
Lymphs Abs: 2.8 10*3/uL (ref 0.7–4.0)
MCH: 30.2 pg (ref 26.0–34.0)
MCHC: 32.4 g/dL (ref 30.0–36.0)
MCV: 93.4 fL (ref 80.0–100.0)
Monocytes Absolute: 1 10*3/uL (ref 0.1–1.0)
Monocytes Relative: 14 %
Neutro Abs: 3.2 10*3/uL (ref 1.7–7.7)
Neutrophils Relative %: 43 %
Platelet Count: 197 10*3/uL (ref 150–400)
RBC: 4.07 MIL/uL (ref 3.87–5.11)
RDW: 15.9 % — ABNORMAL HIGH (ref 11.5–15.5)
WBC Count: 7.2 10*3/uL (ref 4.0–10.5)
nRBC: 0 % (ref 0.0–0.2)

## 2020-05-27 MED ORDER — DOXORUBICIN HCL LIPOSOMAL CHEMO INJECTION 2 MG/ML
28.0000 mg/m2 | Freq: Once | INTRAVENOUS | Status: AC
  Administered 2020-05-27: 50 mg via INTRAVENOUS
  Filled 2020-05-27: qty 25

## 2020-05-27 MED ORDER — DIPHENHYDRAMINE HCL 50 MG/ML IJ SOLN
INTRAMUSCULAR | Status: AC
  Filled 2020-05-27: qty 1

## 2020-05-27 MED ORDER — FAMOTIDINE IN NACL 20-0.9 MG/50ML-% IV SOLN
20.0000 mg | Freq: Once | INTRAVENOUS | Status: AC
  Administered 2020-05-27: 20 mg via INTRAVENOUS

## 2020-05-27 MED ORDER — FAMOTIDINE IN NACL 20-0.9 MG/50ML-% IV SOLN
INTRAVENOUS | Status: AC
  Filled 2020-05-27: qty 50

## 2020-05-27 MED ORDER — SODIUM CHLORIDE 0.9% FLUSH
10.0000 mL | Freq: Once | INTRAVENOUS | Status: AC
  Administered 2020-05-27: 10 mL
  Filled 2020-05-27: qty 10

## 2020-05-27 MED ORDER — ALTEPLASE 2 MG IJ SOLR
INTRAMUSCULAR | Status: AC
  Filled 2020-05-27: qty 2

## 2020-05-27 MED ORDER — SODIUM CHLORIDE 0.9 % IV SOLN
150.0000 mg | Freq: Once | INTRAVENOUS | Status: AC
  Administered 2020-05-27: 150 mg via INTRAVENOUS
  Filled 2020-05-27: qty 150

## 2020-05-27 MED ORDER — HEPARIN SOD (PORK) LOCK FLUSH 100 UNIT/ML IV SOLN
500.0000 [IU] | Freq: Once | INTRAVENOUS | Status: DC | PRN
  Filled 2020-05-27: qty 5

## 2020-05-27 MED ORDER — DIPHENHYDRAMINE HCL 50 MG/ML IJ SOLN
25.0000 mg | Freq: Once | INTRAMUSCULAR | Status: AC
  Administered 2020-05-27: 25 mg via INTRAVENOUS

## 2020-05-27 MED ORDER — SODIUM CHLORIDE 0.9 % IV SOLN
389.5000 mg | Freq: Once | INTRAVENOUS | Status: AC
  Administered 2020-05-27: 390 mg via INTRAVENOUS
  Filled 2020-05-27: qty 39

## 2020-05-27 MED ORDER — SODIUM CHLORIDE 0.9% FLUSH
10.0000 mL | INTRAVENOUS | Status: DC | PRN
  Filled 2020-05-27: qty 10

## 2020-05-27 MED ORDER — ALTEPLASE 2 MG IJ SOLR
2.0000 mg | Freq: Once | INTRAMUSCULAR | Status: AC
  Administered 2020-05-27: 2 mg
  Filled 2020-05-27: qty 2

## 2020-05-27 MED ORDER — PALONOSETRON HCL INJECTION 0.25 MG/5ML
INTRAVENOUS | Status: AC
  Filled 2020-05-27: qty 5

## 2020-05-27 MED ORDER — DEXTROSE 5 % IV SOLN
Freq: Once | INTRAVENOUS | Status: AC
  Filled 2020-05-27: qty 250

## 2020-05-27 MED ORDER — PALONOSETRON HCL INJECTION 0.25 MG/5ML
0.2500 mg | Freq: Once | INTRAVENOUS | Status: AC
  Administered 2020-05-27: 0.25 mg via INTRAVENOUS

## 2020-05-27 MED ORDER — SODIUM CHLORIDE 0.9 % IV SOLN
10.0000 mg | Freq: Once | INTRAVENOUS | Status: AC
  Administered 2020-05-27: 10 mg via INTRAVENOUS
  Filled 2020-05-27: qty 10

## 2020-05-27 NOTE — Assessment & Plan Note (Signed)
Blood pressure is mildly elevated Observe closely for now

## 2020-05-27 NOTE — Telephone Encounter (Signed)
Scheduled appointment per 2/1 sch msg. Spoke to patient who is aware of appointment date and time. Gave patient calendar print out.

## 2020-05-27 NOTE — Assessment & Plan Note (Signed)
Overall, she tolerated last cycle of treatment well Has abdominal pain or nausea Her patient is wondering when she can stop treatment I recommend repeat CT imaging in 2 weeks for further follow-up We discussed the role of maintenance treatment in the future She is undecided We will resume further discussion in a few weeks

## 2020-05-27 NOTE — Progress Notes (Signed)
Pt discharged in no apparent distress. Pt left ambulatory without assistance. Pt aware of discharge instructions and verbalized understanding and had no further questions.  

## 2020-05-27 NOTE — Assessment & Plan Note (Signed)
We have extensive goals of care discussion today She felt that her husband stopped caring about her suffering She is not sure whether she wants to continue future treatment or not I ecommend we repeat CT imaging in 2 weeks and to discuss further plan of care at that time We discussed prognosis She is in agreement

## 2020-05-27 NOTE — Progress Notes (Signed)
[place] OFFICE PROGRESS NOTE  Patient Care Team: Allie Dimmer, MD as PCP - General (Internal Medicine) [name], RN as Oncology Nurse Navigator (Oncology)  ASSESSMENT & PLAN:  Fallopian tube cancer, carcinoma, right ([place]) Overall, she tolerated last cycle of treatment well Has abdominal pain or nausea Her patient is wondering when she can stop treatment I recommend repeat CT imaging in 2 weeks for further follow-up We discussed the role of maintenance treatment in the future She is undecided We will resume further discussion in a few weeks  Elevated BP without diagnosis of hypertension Blood pressure is mildly elevated Observe closely for now  Goals of care, counseling/discussion We have extensive goals of care discussion today She felt that her husband stopped caring about her suffering She is not sure whether she wants to continue future treatment or not I ecommend we repeat CT imaging in 2 weeks and to discuss further plan of care at that time We discussed prognosis She is in agreement   Orders Placed This Encounter  Procedures  . CT CHEST ABDOMEN PELVIS W CONTRAST    Standing Status:   Future    Standing Expiration Date:   05/27/2021    Order Specific Question:   Preferred imaging location?    Answer:   [facility]    Order Specific Question:   Radiology Contrast Protocol - do NOT remove file path    Answer:   [email or web address]    All questions were answered. The patient knows to call the clinic with any problems, questions or concerns. The total time spent in the appointment was 25 minutes encounter with patients including review of chart and various tests results, discussions about plan of care and coordination of care plan   [name], MD 05/27/2020 12:58 PM  INTERVAL HISTORY: Please see below for problem oriented charting. She is seen prior to cycle 5 of treatment The patient is  frustrated and tired She felt that her husband does not care about her wellbeing She denies nausea, pain or changes in her bowel habits She wants to know about prognosis and is wondering about how many more treatment she needs to take  SUMMARY OF ONCOLOGIC HISTORY: Oncology History Overview Note  High grade serous, right fallopian tube  HRD, BRCA tumor testing were neg   Ovarian cancer ([place])  03/21/2018 Initial Diagnosis   Ovarian cancer ([place])   02/04/2020 -  Chemotherapy   The patient had carboplatin and doxil for chemotherapy treatment.     Fallopian tube cancer, carcinoma, right ([place])  10/24/2017 Initial Diagnosis   She has presentation of vague abdominal pain   02/20/2018 Imaging   Outside CT abdomen and pelvis: This revealed an incidental finding of a 1.5 cm subcapsular lesion in the lateral upper pole of the left kidney which measured higher than fluid attenuation which could represent a proteinaceous renal cyst or solid renal mass.  There was no ascites.  There is no lymphadenopathy.  There were no masses in the pelvis including ovarian or adnexal masses seen.  There was however soft tissue stranding seen with nodularity in the omental fat in the lower abdomen without evidence of ascites.  This was felt to represent either carcinomatosis or inflammatory infectious etiologies.  There was colonic diverticulosis seen without radiographic evidence of diverticulitis.  The radiologist recommended MRI of the abdomen to further work-up the renal mass   02/22/2018 Tumor Marker   Patient's tumor was tested for the following markers: CA-125. Results of  the tumor marker test revealed 103.   03/06/2018 Pathology Results   Omentum, biopsy - ADENOCARCINOMA WITH PSAMMOMA BODIES, SEE COMMENT. Microscopic Comment There are scattered small foci of malignant glands with associated psammoma bodies. While the tumor is somewhat limited the cells have a more low grade appearance. There is prominent  lymphovascular invasion. Immunohistochemistry reveals the cells are positive for cytokeratin 7, ER, MOC31, PAX8, and WT-1. They are negative for calretinin, cytokeratin 20, CDX-2, and TTF-1. Overall, the findings are consistent with adenocarcinoma. The differential includes a primary gynecologic or peritoneal tumor, although the morphology is not typical of a high grade serous carcinoma.   03/06/2018 Surgery   Surgeon: Donaciano Eva   Pre-operative Diagnosis: omental mass, elevated CA 125 Operation: Laparoscopic omentectomy  Surgeon: Donaciano Eva  Operative Findings:  : normal diaphragm and upper omentum. Sigmoid colon densely adherent to left pelvic side wall consistent with history of diverticulitis. Omentum adherent to anterior abdominal wall and sigmoid colon. Surgically absent uterus. Right ovary very small and grossly normal. Left ovary obscured by adhesed sigmoid colon. No ascites. No carcinomatosis. There was a nodular firm distal omentum on the left which was adherent to the sigmoid colon and most consistent with inflammatory change.      03/21/2018 Pathology Results   1. Soft tissue mass, simple excision, midline abdominal wall mass - METASTATIC SEROUS CARCINOMA. 2. Omentum, resection for tumor - METASTATIC SEROUS CARCINOMA. 3. Adnexa - ovary +/- tube, neoplastic, right - RIGHT FALLOPIAN TUBE: -HIGH GRADE SEROUS CARCINOMA. -SEE ONCOLOGY TABLE. - RIGHT OVARY: SURFACE PSAMMOMA BODIES. NO DEFINITIVE MALIGNANT CELLS. -INCLUSION CYSTS. 4. Adnexa - ovary +/- tube, neoplastic, left - LEFT FALLOPIAN TUBE: LUMINAL AND SURFACE SEROUS CARCINOMA. - LEFT OVARY: FOCAL PSAMMOMA BODIES AND MALIGNANT CELLS. 5. Soft tissue mass, simple excision, left abdominal wall - METASTATIC SEROUS CARCINOMA. Microscopic Comment 3. OVARY or FALLOPIAN TUBE or PRIMARY PERITONEUM: Procedure: Bilateral salpingo-oophorectomy. Abdominal wall mass excision and omentectomy. Specimen  Integrity: Intact. Tumor Site: Right fallopian tube. Ovarian Surface Involvement (required only if applicable): Present (left). Fallopian Tube Surface Involvement (required only if applicable): Present. Tumor Size: 1.5 cm. Histologic Type: High grade serous carcinoma. Histologic Grade: High grade. Implants (required for advanced stage serous/seromucinous borderline tumors only): Present. Other Tissue/ Organ Involvement: Omentum, abdominal wall, left fallopian tube and ovary. Largest Extrapelvic Peritoneal Focus (required only if applicable): >2 cm. Peritoneal/Ascitic Fluid: N/A. Treatment Effect (required only for high-grade serous carcinomas): N/A. Regional Lymph Nodes: No lymph nodes submitted or found Pathologic Stage Classification (pTNM, AJCC 8th Edition): pT3c, pNX Representative Tumor Block: 3A Comment(s): None.   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   Surgeon: Thereasa Solo, MD. Specimens: Bilateral tubes / ovaries, omentum. Midline abdominal wall mass, left lateral abdominal wall mass.    Operative Findings:  Abdominal wall masses consistent with port site metastases at the umbilical incision (7cm) and left lateral incision (5cm). Omental caking in the infracolic omentum. Grossly normal very small tubes and ovaries. Normal diaphragms. No lymphadenopathy. Normal small and large intestine with the exception of miliary studding of tumor on the surface of the sigmoid colon and rectum in the cul de sac.    This represented an optimal cytoreduction (R1) with no gross visible disease remaining, however there was a thin rind of tumor on the lateral left fascia associated with the port  site metastasis.     04/11/2018 Cancer Staging   Staging form: Ovary,  Fallopian Tube, and Primary Peritoneal Carcinoma, AJCC 8th Edition - Pathologic: FIGO Stage IIIC (pT3, pN0, cM0) - Signed by [name], MD on 04/11/2018   05/23/2018 Imaging   1. Interval resolution of the anterior midline abdominal wall seroma. There is some persistent wispy soft tissue attenuation in the region of the midline incision, nonspecific and may simply reflect granulation tissue from surgery. 2. Stable 13 mm exophytic lesion posterior left kidney with attenuation higher than expected for a simple cyst. This may be a cyst complicated by proteinaceous debris or hemorrhage, but attention on follow-up recommended. 3. Stable mild persistent distention of proximal jejunal loops with gradual tapering to nondilated distal small bowel. 4. No overt peritoneal or mesenteric disease identified on this exam.   05/23/2018 Tumor Marker   Patient's tumor was tested for the following markers: CA-125 Results of the tumor marker test revealed 11.6   06/02/2018 - 09/20/2018 Chemotherapy   The patient had carboplatin and taxol   06/19/2018 Procedure   Status post right IJ port catheter placement. Catheter ready for use.   06/26/2018 Tumor Marker   Patient's tumor was tested for the following markers: CA-125. Results of the tumor marker test revealed 10.7   09/20/2018 Tumor Marker   Patient's tumor was tested for the following markers: CA-125. Results of the tumor marker test revealed 9.1   10/25/2018 Imaging   1. There is suspicious, abnormal asymmetric increased soft tissue involving the cecum and ascending colon. This is suspicious for residual/progressive serosal involvement by tumor. 2. No additional sites of disease identified. No evidence for nodal metastasis, solid organ metastasis or ascites.     11/07/2018 Procedure   She had negative colonoscopy evaluation   12/05/2018 Tumor Marker   Patient's tumor was tested for the following markers: CA-125 Results of the tumor marker test revealed  8.6   01/25/2019 Imaging   Ct abdomen and pelvis Signs of omentectomy and hysterectomy without definitive signs of residual recurrent disease. Small nodular focus bridging rectum and vagina is stable dating back January to 12/14/2018, potentially postoperative but suggest attention on subsequent imaging.   01/25/2019 Tumor Marker   Patient's tumor was tested for the following markers: CA-125 Results of the tumor marker test revealed 10.3   03/15/2019 Tumor Marker   Patient's tumor was tested for the following markers: CA-125 Results of the tumor marker test revealed 9.6   03/23/2019 Imaging   No acute findings in the abdomen or pelvis.   Colonic diverticulosis.  No active diverticulitis.   Prior open tectum E and hysterectomy.   Aortic atherosclerosis.     05/10/2019 Tumor Marker   Patient's tumor was tested for the following markers: CA-125 Results of the tumor marker test revealed 11.8   05/22/2019 Imaging   1. Stable exam. No evidence of recurrent or metastatic carcinoma within the abdomen or pelvis. 2. Colonic diverticulosis, without radiographic evidence of diverticulitis.     11/08/2019 Imaging   1. Status post hysterectomy and bilateral oophorectomy. 2. Soft tissue thickening within the deep pelvis which when compared to multiple prior exams is felt to be slowly progressive. Especially given the elevated CA 125 level, this is suspicious for peritoneal recurrence. Potential imaging strategies include PET (which may be of low sensitivity secondary to the lack of well-defined dominant mass) or pelvic CT follow-up at 3 months. 3. Otherwise, no evidence of metastatic disease within the chest, abdomen, or pelvis. 4.  Coronary artery atherosclerosis. Aortic Atherosclerosis (ICD10-I70.0). 5. Ventral abdominal wall hernia containing nonobstructive transverse colon, as before. 6.  Possible constipation.   12/21/2019 Tumor Marker   Patient's tumor was tested for the following markers:  95.2. Results of the tumor marker test revealed CA-`125.   01/17/2020 Imaging   1. Similar appearance of nodularity in the central pelvis along the vaginal cuff. There is a particular nodular soft tissue density measuring 2.2 x 1.6 cm along the wall of the proximal rectum that is slightly more conspicuous than previously, raising concern for possible minimal progression. Similar appearance of the other areas of nodular soft tissue in the central pelvis. PET-CT may prove helpful to further evaluate. 2. No ascites. 3. Left colonic diverticulosis without diverticulitis. 4. 1.7 cm exophytic lesion upper pole left kidney with well-defined homogeneous attenuation slightly higher than would be expected for simple fluid. This is probably a complex cyst. Attention on follow-up recommended. 5. Aortic Atherosclerosis (ICD10-I70.0).   01/17/2020 Tumor Marker   Patient's tumor was tested for the following markers: CA-125 Results of the tumor marker test revealed 135   01/30/2020 Echocardiogram   1. Left ventricular ejection fraction, by estimation, is 55 to 60%. Left ventricular ejection fraction by PLAX is 55 %. The left ventricle has normal function. The left ventricle has no regional wall motion abnormalities. Left ventricular diastolic parameters are indeterminate.  2. Right ventricular systolic function is normal. The right ventricular size is normal.  3. Left atrial size was borderline dilated.  4. The mitral valve is grossly normal. Trivial mitral valve regurgitation.  5. The aortic valve was not well visualized. Aortic valve regurgitation is trivial.  6. The inferior vena cava is normal in size with greater than 50% respiratory variability, suggesting right atrial pressure of 3 mmHg.   02/04/2020 -  Chemotherapy   The patient had carboplatin and doxil for chemotherapy treatment.     03/03/2020 Tumor Marker   Patient's tumor was tested for the following markers: CA-125 Results of the tumor marker  test revealed 272   03/07/2020 Procedure   1. Aspiration of left lower quadrant fluid collection yields 5 mL simple straw-colored serous fluid. 2. As the fluid appears to be simple in nature, no drainage catheter was placed. 3. The aspirated fluid was sent 4 body fluid culture to confirm its sterility.   03/07/2020 Imaging   VQ scan No perfusion defects evident. No findings indicative of pulmonary embolus.   03/07/2020 Imaging   Outside CT Multiple serosal implants again noted within the pelvis. However, increasing peritoneal nodularity and peritoneal thickening noted within the flanks and right upper quadrant as well as developing trace ascites within the mesentery all suspicious for progressive metastatic disease. Correlation with tumor markers may be helpful for further management.   Interval development of a 3.2 cm fluid collection within the left lower quadrant. Given its relatively rapid development, and alternative etiology such as a diverticular abscess should be considered. Recurrent disease, particular given the patient's history of serous adenocarcinoma, is not definitively excluded   03/31/2020 Tumor Marker   Patient's tumor was tested for the following markers: CA-125. Results of the tumor marker test revealed 129   04/29/2020 Tumor Marker   Patient's tumor was tested for the following markers: CA-125 Results of the tumor marker test revealed 66.5   05/22/2020 Echocardiogram   1. GLS -19.9%.  2. Left ventricular ejection fraction, by estimation, is 55 to 60%. The left ventricle has normal function. The left ventricle has no regional  wall motion abnormalities. Left ventricular diastolic parameters were normal.  3. Right ventricular systolic function is normal. The right ventricular size is normal.  4. The mitral valve is normal in structure. Trivial mitral valve regurgitation. No evidence of mitral stenosis.  5. The aortic valve is tricuspid. Aortic valve regurgitation is not  visualized. No aortic stenosis is present.  6. The inferior vena cava is normal in size with greater than 50% respiratory variability, suggesting right atrial pressure of 3 mmHg.     REVIEW OF SYSTEMS:   Constitutional: Denies fevers, chills or abnormal weight loss Eyes: Denies blurriness of vision Ears, nose, mouth, throat, and face: Denies mucositis or sore throat Respiratory: Denies cough, dyspnea or wheezes Cardiovascular: Denies palpitation, chest discomfort or lower extremity swelling Gastrointestinal:  Denies nausea, heartburn or change in bowel habits Skin: Denies abnormal skin rashes Lymphatics: Denies new lymphadenopathy or easy bruising Neurological:Denies numbness, tingling or new weaknesses Behavioral/Psych: Mood is stable, no new changes  All other systems were reviewed with the patient and are negative.  I have reviewed the past medical history, past surgical history, social history and family history with the patient and they are unchanged from previous note.  ALLERGIES:  is allergic to ivp dye [iodinated diagnostic agents], propoxyphene, codeine, and ultram [tramadol hcl].  MEDICATIONS:  Current Outpatient Medications  Medication Sig Dispense Refill  . acetaminophen (TYLENOL) 325 MG tablet Take 650 mg by mouth every 6 (six) hours as needed for mild pain, fever or headache.    . Ascorbic Acid (VITAMIN C PO) Take 1 tablet by mouth daily.    . Biotin 1 MG CAPS Take 1 mg by mouth daily.     . carboxymethylcellulose (REFRESH PLUS) 0.5 % SOLN Place 1 drop into both eyes 3 (three) times daily as needed (for dry eyes.).    Marland Kitchen Cholecalciferol (VITAMIN D3 SUPER STRENGTH) 50 MCG (2000 UT) TABS Take 2,000 Units by mouth daily.    Marland Kitchen HYDROmorphone (DILAUDID) 2 MG tablet Take 1 tablet (2 mg total) by mouth every 4 (four) hours as needed for severe pain. 60 tablet 0  . lidocaine-prilocaine (EMLA) cream Apply 1 application topically as needed. Port access    . ondansetron (ZOFRAN) 8 MG  tablet TAKE 1 TABLET (8 MG TOTAL) BY MOUTH EVERY 8 (EIGHT) HOURS AS NEEDED FOR REFRACTORY NAUSEA / VOMITING. START ON DAY 3 AFTER CHEMO. (Patient taking differently: Take 8 mg by mouth every 8 (eight) hours as needed for nausea or vomiting. ) 30 tablet 1  . polyethylene glycol (MIRALAX / GLYCOLAX) 17 g packet Take 17 g by mouth daily. 14 each 0  . prochlorperazine (COMPAZINE) 10 MG tablet Take 1 tablet (10 mg total) by mouth every 6 (six) hours as needed (Nausea or vomiting). 30 tablet 1  . senna-docusate (SENOKOT-S) 8.6-50 MG tablet Take 2 tablets by mouth 2 (two) times daily. 30 tablet 3   No current facility-administered medications for this visit.    PHYSICAL EXAMINATION: ECOG PERFORMANCE STATUS: 1 - Symptomatic but completely ambulatory  Vitals:   05/27/20 1227  BP: (!) 148/82  Pulse: 90  Resp: 18  Temp: 98.6 F (37 C)  SpO2: 99%   Filed Weights   05/27/20 1227  Weight: 155 lb 3.2 oz (70.4 kg)    GENERAL:alert, no distress and comfortable NEURO: alert & oriented x 3 with fluent speech, no focal motor/sensory deficits  LABORATORY DATA:  I have reviewed the data as listed    Component Value Date/Time   NA  140 04/29/2020 1131   K 3.5 04/29/2020 1131   CL 105 04/29/2020 1131   CO2 25 04/29/2020 1131   GLUCOSE 166 (H) 04/29/2020 1131   BUN 15 04/29/2020 1131   CREATININE 0.78 04/29/2020 1131   CALCIUM 9.2 04/29/2020 1131   PROT 6.8 04/29/2020 1131   ALBUMIN 3.7 04/29/2020 1131   AST 17 04/29/2020 1131   ALT 16 04/29/2020 1131   ALKPHOS 55 04/29/2020 1131   BILITOT 0.4 04/29/2020 1131   GFRNONAA >60 04/29/2020 1131   GFRAA >60 01/17/2020 1339    No results found for: SPEP, UPEP  Lab Results  Component Value Date   WBC 7.2 05/27/2020   NEUTROABS 3.2 05/27/2020   HGB 12.3 05/27/2020   HCT 38.0 05/27/2020   MCV 93.4 05/27/2020   PLT 197 05/27/2020      Chemistry      Component Value Date/Time   NA 140 04/29/2020 1131   K 3.5 04/29/2020 1131   CL 105  04/29/2020 1131   CO2 25 04/29/2020 1131   BUN 15 04/29/2020 1131   CREATININE 0.78 04/29/2020 1131      Component Value Date/Time   CALCIUM 9.2 04/29/2020 1131   ALKPHOS 55 04/29/2020 1131   AST 17 04/29/2020 1131   ALT 16 04/29/2020 1131   BILITOT 0.4 04/29/2020 1131       RADIOGRAPHIC STUDIES: I have personally reviewed the radiological images as listed and agreed with the findings in the report. ECHOCARDIOGRAM COMPLETE  Result Date: 05/22/2020    ECHOCARDIOGRAM REPORT   Patient Name:   M[facility] Date of Exam: 05/22/2020 Medical Rec #:  [id number]             Height:       64.0 in Accession #:    [id number]            Weight:       156.8 lb Date of Birth:  Sep 11, 1940             BSA:          1.764 m Patient Age:    43 years              BP:           140/80 mmHg Patient Gender: F                     HR:           72 bpm. Exam Location:  Outpatient Procedure: 2D Echo, Cardiac Doppler, Color Doppler and Strain Analysis Indications:    Chemo Z09  History:        Patient has prior history of Echocardiogram examinations, most                 recent 01/30/2020. Risk Factors:Non-Smoker. GERD.  Sonographer:    Vickie Epley RDCS Referring Phys: [id number] Zymier Rodgers [place]  1. GLS -19.9%.  2. Left ventricular ejection fraction, by estimation, is 55 to 60%. The left ventricle has normal function. The left ventricle has no regional wall motion abnormalities. Left ventricular diastolic parameters were normal.  3. Right ventricular systolic function is normal. The right ventricular size is normal.  4. The mitral valve is normal in structure. Trivial mitral valve regurgitation. No evidence of mitral stenosis.  5. The aortic valve is tricuspid. Aortic valve regurgitation is not visualized. No aortic stenosis is present.  6. The inferior vena cava is normal in size with greater than  50% respiratory variability, suggesting right atrial pressure of 3 mmHg. FINDINGS  Left Ventricle: Left ventricular  ejection fraction, by estimation, is 55 to 60%. The left ventricle has normal function. The left ventricle has no regional wall motion abnormalities. The left ventricular internal cavity size was normal in size. There is  no left ventricular hypertrophy. Left ventricular diastolic parameters were normal. Right Ventricle: The right ventricular size is normal.Right ventricular systolic function is normal. Left Atrium: Left atrial size was normal in size. Right Atrium: Right atrial size was normal in size. Pericardium: There is no evidence of pericardial effusion. Mitral Valve: The mitral valve is normal in structure. Trivial mitral valve regurgitation. No evidence of mitral valve stenosis. Tricuspid Valve: The tricuspid valve is normal in structure. Tricuspid valve regurgitation is trivial. No evidence of tricuspid stenosis. Aortic Valve: The aortic valve is tricuspid. Aortic valve regurgitation is not visualized. No aortic stenosis is present. Pulmonic Valve: The pulmonic valve was normal in structure. Pulmonic valve regurgitation is trivial. No evidence of pulmonic stenosis. Aorta: The aortic root is normal in size and structure. Venous: The inferior vena cava is normal in size with greater than 50% respiratory variability, suggesting right atrial pressure of 3 mmHg. IAS/Shunts: No atrial level shunt detected by color flow Doppler. Additional Comments: GLS -19.9%.  LEFT VENTRICLE PLAX 2D LVIDd:         4.60 cm     Diastology LVIDs:         3.40 cm     LV e' medial:    8.81 cm/s LV PW:         0.70 cm     LV E/e' medial:  8.6 LV IVS:        0.70 cm     LV e' lateral:   9.57 cm/s LVOT diam:     1.70 cm     LV E/e' lateral: 7.9 LV SV:         46 LV SV Index:   26 LVOT Area:     2.27 cm  LV Volumes (MOD) LV vol d, MOD A2C: 83.0 ml LV vol d, MOD A4C: 76.3 ml LV vol s, MOD A2C: 37.0 ml LV vol s, MOD A4C: 41.8 ml LV SV MOD A2C:     46.0 ml LV SV MOD A4C:     76.3 ml LV SV MOD BP:      42.0 ml RIGHT VENTRICLE RV S prime:      10.90 cm/s TAPSE (M-mode): 1.8 cm LEFT ATRIUM             Index       RIGHT ATRIUM           Index LA diam:        3.00 cm 1.70 cm/m  RA Area:     10.40 cm LA Vol (A2C):   20.7 ml 11.73 ml/m RA Volume:   21.90 ml  12.41 ml/m LA Vol (A4C):   25.8 ml 14.62 ml/m LA Biplane Vol: 23.9 ml 13.55 ml/m  AORTIC VALVE LVOT Vmax:   108.00 cm/s LVOT Vmean:  68.000 cm/s LVOT VTI:    0.203 m  AORTA Ao Root diam: 2.60 cm MITRAL VALVE MV Area (PHT): 3.60 cm    SHUNTS MV Decel Time: 211 msec    Systemic VTI:  0.20 m MV E velocity: 75.80 cm/s  Systemic Diam: 1.70 cm MV A velocity: 72.40 cm/s MV E/A ratio:  1.05 Kirk Ruths MD Electronically signed by Kirk Ruths  MD Signature Date/Time: 05/22/2020/11:16:20 AM    Final

## 2020-05-28 LAB — CA 125: Cancer Antigen (CA) 125: 61.6 U/mL — ABNORMAL HIGH (ref 0.0–38.1)

## 2020-06-06 ENCOUNTER — Telehealth: Payer: Self-pay

## 2020-06-06 NOTE — Telephone Encounter (Signed)
Called to verify that she has prednisone Rx for 13 hour prep for scan on 2/15. She has Rx. Reviewed time to take Prednisone and Benadryl prior to scan.

## 2020-06-10 ENCOUNTER — Encounter (HOSPITAL_COMMUNITY): Payer: Self-pay

## 2020-06-10 ENCOUNTER — Inpatient Hospital Stay (HOSPITAL_BASED_OUTPATIENT_CLINIC_OR_DEPARTMENT_OTHER): Payer: Medicare HMO | Admitting: Hematology and Oncology

## 2020-06-10 ENCOUNTER — Encounter: Payer: Self-pay | Admitting: Hematology and Oncology

## 2020-06-10 ENCOUNTER — Ambulatory Visit (HOSPITAL_COMMUNITY)
Admission: RE | Admit: 2020-06-10 | Discharge: 2020-06-10 | Disposition: A | Discharge status: Home / Self Care | Payer: Medicare HMO | Source: Ambulatory Visit | Attending: Hematology and Oncology | Admitting: Hematology and Oncology

## 2020-06-10 ENCOUNTER — Other Ambulatory Visit: Payer: Self-pay

## 2020-06-10 DIAGNOSIS — C5701 Malignant neoplasm of right fallopian tube: Secondary | ICD-10-CM

## 2020-06-10 DIAGNOSIS — Z5111 Encounter for antineoplastic chemotherapy: Secondary | ICD-10-CM | POA: Diagnosis not present

## 2020-06-10 DIAGNOSIS — K5730 Diverticulosis of large intestine without perforation or abscess without bleeding: Secondary | ICD-10-CM

## 2020-06-10 DIAGNOSIS — Z7189 Other specified counseling: Secondary | ICD-10-CM

## 2020-06-10 MED ORDER — HEPARIN SOD (PORK) LOCK FLUSH 100 UNIT/ML IV SOLN
INTRAVENOUS | Status: AC
  Administered 2020-06-10: 500 [IU] via INTRAVENOUS
  Filled 2020-06-10: qty 5

## 2020-06-10 MED ORDER — HEPARIN SOD (PORK) LOCK FLUSH 100 UNIT/ML IV SOLN: 500.0000 [IU] | Freq: Once | INTRAVENOUS | Status: AC

## 2020-06-10 MED ORDER — IOHEXOL 300 MG/ML  SOLN
100.0000 mL | Freq: Once | INTRAMUSCULAR | Status: AC | PRN
  Administered 2020-06-10: 100 mL via INTRAVENOUS

## 2020-06-10 NOTE — Progress Notes (Signed)
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[facility]) I have reviewed multiple CT imaging with the patient and her husband Overall, she has no clinical signs of disease progression I felt that her skin looks stable/improved The patient felt terrible and difficulties tolerating treatment Recent treatment makes her tired and not feel good We discussed various treatment options We could switch her to either maintenance treatment with bevacizumab versus niraparib However, her tumor marker is not normal One could also make a case to proceed with a few more cycles of chemotherapy and repeat CT imaging in the future We discussed the risk and benefits of bevacizumab and niraparib Ultimately, she is undecided She will call me with decision at the end of the week  Diverticulosis of colon We discussed the importance of aggressive laxatives  Goals of care, counseling/discussion She is aware that treatment goal is palliative in nature   No orders of the defined types were placed in this encounter.   All questions were answered. The patient knows to call the clinic with any problems, questions or concerns. The total time spent in the appointment was 40 minutes encounter with patients including review of chart and various tests results, discussions about plan of care and coordination of care plan   [name], MD 06/10/2020 3:25 PM  INTERVAL HISTORY: Please see below for problem oriented charting. She returns with her husband for further follow-up She felt fatigue with the last cycle of treatment She also felt bloated occasionally She continue aggressive laxative therapy No recent nausea  SUMMARY OF ONCOLOGIC HISTORY: Oncology History Overview Note  High grade serous, right fallopian  tube  HRD, BRCA tumor testing were neg   Ovarian cancer ([place])  03/21/2018 Initial Diagnosis   Ovarian cancer ([place])   02/04/2020 -  Chemotherapy   The patient had carboplatin and doxil for chemotherapy treatment.     Fallopian tube cancer, carcinoma, right ([place])  10/24/2017 Initial Diagnosis   She has presentation of vague abdominal pain   02/20/2018 Imaging   Outside CT abdomen and pelvis: This revealed an incidental finding of a 1.5 cm subcapsular lesion in the lateral upper pole of the left kidney which measured higher than fluid attenuation which could represent a proteinaceous renal cyst or solid renal mass.  There was no ascites.  There is no lymphadenopathy.  There were no masses in the pelvis including ovarian or adnexal masses seen.  There was however soft tissue stranding seen with nodularity in the omental fat in the lower abdomen without evidence of ascites.  This was felt to represent either carcinomatosis or inflammatory infectious etiologies.  There was colonic diverticulosis seen without radiographic evidence of diverticulitis.  The radiologist recommended MRI of the abdomen to further work-up the renal mass   02/22/2018 Tumor Marker   Patient's tumor was tested for the following markers: CA-125. Results of the tumor marker test revealed 103.   03/06/2018 Pathology Results   Omentum, biopsy - ADENOCARCINOMA WITH PSAMMOMA BODIES, SEE COMMENT. Microscopic Comment There are scattered small foci of malignant glands with associated psammoma bodies. While the tumor is somewhat limited the cells have a more low grade appearance. There is prominent lymphovascular invasion. Immunohistochemistry reveals the cells are positive for cytokeratin 7, ER, MOC31, PAX8, and WT-1. They are negative for calretinin, cytokeratin 20, CDX-2, and TTF-1.  Overall, the findings are consistent with adenocarcinoma. The differential includes a primary gynecologic or peritoneal tumor, although the morphology  is not typical of a high grade serous carcinoma.   03/06/2018 Surgery   Surgeon: Donaciano Eva   Pre-operative Diagnosis: omental mass, elevated CA 125 Operation: Laparoscopic omentectomy  Surgeon: Donaciano Eva  Operative Findings:  : normal diaphragm and upper omentum. Sigmoid colon densely adherent to left pelvic side wall consistent with history of diverticulitis. Omentum adherent to anterior abdominal wall and sigmoid colon. Surgically absent uterus. Right ovary very small and grossly normal. Left ovary obscured by adhesed sigmoid colon. No ascites. No carcinomatosis. There was a nodular firm distal omentum on the left which was adherent to the sigmoid colon and most consistent with inflammatory change.      03/21/2018 Pathology Results   1. Soft tissue mass, simple excision, midline abdominal wall mass - METASTATIC SEROUS CARCINOMA. 2. Omentum, resection for tumor - METASTATIC SEROUS CARCINOMA. 3. Adnexa - ovary +/- tube, neoplastic, right - RIGHT FALLOPIAN TUBE: -HIGH GRADE SEROUS CARCINOMA. -SEE ONCOLOGY TABLE. - RIGHT OVARY: SURFACE PSAMMOMA BODIES. NO DEFINITIVE MALIGNANT CELLS. -INCLUSION CYSTS. 4. Adnexa - ovary +/- tube, neoplastic, left - LEFT FALLOPIAN TUBE: LUMINAL AND SURFACE SEROUS CARCINOMA. - LEFT OVARY: FOCAL PSAMMOMA BODIES AND MALIGNANT CELLS. 5. Soft tissue mass, simple excision, left abdominal wall - METASTATIC SEROUS CARCINOMA. Microscopic Comment 3. OVARY or FALLOPIAN TUBE or PRIMARY PERITONEUM: Procedure: Bilateral salpingo-oophorectomy. Abdominal wall mass excision and omentectomy. Specimen Integrity: Intact. Tumor Site: Right fallopian tube. Ovarian Surface Involvement (required only if applicable): Present (left). Fallopian Tube Surface Involvement (required only if applicable): Present. Tumor Size: 1.5 cm. Histologic Type: High grade serous carcinoma. Histologic Grade: High grade. Implants (required for advanced stage  serous/seromucinous borderline tumors only): Present. Other Tissue/ Organ Involvement: Omentum, abdominal wall, left fallopian tube and ovary. Largest Extrapelvic Peritoneal Focus (required only if applicable): >2 cm. Peritoneal/Ascitic Fluid: N/A. Treatment Effect (required only for high-grade serous carcinomas): N/A. Regional Lymph Nodes: No lymph nodes submitted or found Pathologic Stage Classification (pTNM, AJCC 8th Edition): pT3c, pNX Representative Tumor Block: 3A Comment(s): None.   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   Surgeon: Thereasa Solo, MD. Specimens: Bilateral tubes / ovaries, omentum. Midline abdominal wall mass, left lateral abdominal wall mass.    Operative Findings:  Abdominal wall masses consistent with port site metastases at the umbilical incision (7cm) and left lateral incision (5cm). Omental caking in the infracolic omentum. Grossly normal very small tubes and ovaries. Normal diaphragms. No lymphadenopathy. Normal small and large intestine with the exception of miliary studding of tumor on the surface of the sigmoid colon and rectum in the cul de sac.    This represented an optimal cytoreduction (R1) with no gross visible disease remaining, however there was a thin rind of tumor on the lateral left fascia associated with the port site metastasis.     04/11/2018 Cancer Staging   Staging form: Ovary, Fallopian Tube, and Primary Peritoneal Carcinoma, AJCC 8th Edition - Pathologic: FIGO Stage IIIC (pT3, pN0, cM0) - Signed by [name], MD on 04/11/2018   05/23/2018 Imaging   1. Interval resolution of the anterior midline abdominal wall seroma. There is some persistent wispy soft tissue attenuation in the region of the midline incision,  nonspecific and may simply reflect granulation tissue from  surgery. 2. Stable 13 mm exophytic lesion posterior left kidney with attenuation higher than expected for a simple cyst. This may be a cyst complicated by proteinaceous debris or hemorrhage, but attention on follow-up recommended. 3. Stable mild persistent distention of proximal jejunal loops with gradual tapering to nondilated distal small bowel. 4. No overt peritoneal or mesenteric disease identified on this exam.   05/23/2018 Tumor Marker   Patient's tumor was tested for the following markers: CA-125 Results of the tumor marker test revealed 11.6   06/02/2018 - 09/20/2018 Chemotherapy   The patient had carboplatin and taxol   06/19/2018 Procedure   Status post right IJ port catheter placement. Catheter ready for use.   06/26/2018 Tumor Marker   Patient's tumor was tested for the following markers: CA-125. Results of the tumor marker test revealed 10.7   09/20/2018 Tumor Marker   Patient's tumor was tested for the following markers: CA-125. Results of the tumor marker test revealed 9.1   10/25/2018 Imaging   1. There is suspicious, abnormal asymmetric increased soft tissue involving the cecum and ascending colon. This is suspicious for residual/progressive serosal involvement by tumor. 2. No additional sites of disease identified. No evidence for nodal metastasis, solid organ metastasis or ascites.     11/07/2018 Procedure   She had negative colonoscopy evaluation   12/05/2018 Tumor Marker   Patient's tumor was tested for the following markers: CA-125 Results of the tumor marker test revealed 8.6   01/25/2019 Imaging   Ct abdomen and pelvis Signs of omentectomy and hysterectomy without definitive signs of residual recurrent disease. Small nodular focus bridging rectum and vagina is stable dating back January to 12/14/2018, potentially postoperative but suggest attention on subsequent imaging.   01/25/2019 Tumor Marker   Patient's  tumor was tested for the following markers: CA-125 Results of the tumor marker test revealed 10.3   03/15/2019 Tumor Marker   Patient's tumor was tested for the following markers: CA-125 Results of the tumor marker test revealed 9.6   03/23/2019 Imaging   No acute findings in the abdomen or pelvis.   Colonic diverticulosis.  No active diverticulitis.   Prior open tectum E and hysterectomy.   Aortic atherosclerosis.     05/10/2019 Tumor Marker   Patient's tumor was tested for the following markers: CA-125 Results of the tumor marker test revealed 11.8   05/22/2019 Imaging   1. Stable exam. No evidence of recurrent or metastatic carcinoma within the abdomen or pelvis. 2. Colonic diverticulosis, without radiographic evidence of diverticulitis.     11/08/2019 Imaging   1. Status post hysterectomy and bilateral oophorectomy. 2. Soft tissue thickening within the deep pelvis which when compared to multiple prior exams is felt to be slowly progressive. Especially given the elevated CA 125 level, this is suspicious for peritoneal recurrence. Potential imaging strategies include PET (which may be of low sensitivity secondary to the lack of well-defined dominant mass) or pelvic CT follow-up at 3 months. 3. Otherwise, no evidence of metastatic disease within the chest, abdomen, or pelvis. 4. Coronary artery atherosclerosis. Aortic Atherosclerosis (ICD10-I70.0). 5. Ventral abdominal wall hernia containing nonobstructive transverse colon, as before. 6.  Possible constipation.   12/21/2019 Tumor Marker   Patient's tumor was tested for the following markers: 95.2. Results of the tumor marker test revealed CA-`125.   01/17/2020 Imaging   1. Similar appearance of nodularity in the central pelvis along the vaginal cuff. There is a particular nodular soft tissue density measuring 2.2 x 1.6 cm along the  wall of the proximal rectum that is slightly more conspicuous than previously, raising concern for  possible minimal progression. Similar appearance of the other areas of nodular soft tissue in the central pelvis. PET-CT may prove helpful to further evaluate. 2. No ascites. 3. Left colonic diverticulosis without diverticulitis. 4. 1.7 cm exophytic lesion upper pole left kidney with well-defined homogeneous attenuation slightly higher than would be expected for simple fluid. This is probably a complex cyst. Attention on follow-up recommended. 5. Aortic Atherosclerosis (ICD10-I70.0).   01/17/2020 Tumor Marker   Patient's tumor was tested for the following markers: CA-125 Results of the tumor marker test revealed 135   01/30/2020 Echocardiogram   1. Left ventricular ejection fraction, by estimation, is 55 to 60%. Left ventricular ejection fraction by PLAX is 55 %. The left ventricle has normal function. The left ventricle has no regional wall motion abnormalities. Left ventricular diastolic parameters are indeterminate.  2. Right ventricular systolic function is normal. The right ventricular size is normal.  3. Left atrial size was borderline dilated.  4. The mitral valve is grossly normal. Trivial mitral valve regurgitation.  5. The aortic valve was not well visualized. Aortic valve regurgitation is trivial.  6. The inferior vena cava is normal in size with greater than 50% respiratory variability, suggesting right atrial pressure of 3 mmHg.   02/04/2020 -  Chemotherapy   The patient had carboplatin and doxil for chemotherapy treatment.     03/03/2020 Tumor Marker   Patient's tumor was tested for the following markers: CA-125 Results of the tumor marker test revealed 272   03/07/2020 Procedure   1. Aspiration of left lower quadrant fluid collection yields 5 mL simple straw-colored serous fluid. 2. As the fluid appears to be simple in nature, no drainage catheter was placed. 3. The aspirated fluid was sent 4 body fluid culture to confirm its sterility.   03/07/2020 Imaging   VQ scan No  perfusion defects evident. No findings indicative of pulmonary embolus.   03/07/2020 Imaging   Outside CT Multiple serosal implants again noted within the pelvis. However, increasing peritoneal nodularity and peritoneal thickening noted within the flanks and right upper quadrant as well as developing trace ascites within the mesentery all suspicious for progressive metastatic disease. Correlation with tumor markers may be helpful for further management.   Interval development of a 3.2 cm fluid collection within the left lower quadrant. Given its relatively rapid development, and alternative etiology such as a diverticular abscess should be considered. Recurrent disease, particular given the patient's history of serous adenocarcinoma, is not definitively excluded   03/31/2020 Tumor Marker   Patient's tumor was tested for the following markers: CA-125. Results of the tumor marker test revealed 129   04/29/2020 Tumor Marker   Patient's tumor was tested for the following markers: CA-125 Results of the tumor marker test revealed 66.5   05/22/2020 Echocardiogram   1. GLS -19.9%.  2. Left ventricular ejection fraction, by estimation, is 55 to 60%. The left ventricle has normal function. The left ventricle has no regional wall motion abnormalities. Left ventricular diastolic parameters were normal.  3. Right ventricular systolic function is normal. The right ventricular size is normal.  4. The mitral valve is normal in structure. Trivial mitral valve regurgitation. No evidence of mitral stenosis.  5. The aortic valve is tricuspid. Aortic valve regurgitation is not visualized. No aortic stenosis is present.  6. The inferior vena cava is normal in size with greater than 50% respiratory variability, suggesting right atrial pressure  of 3 mmHg.   05/27/2020 Tumor Marker   Patient's tumor was tested for the following markers: CA-125 Results of the tumor marker test revealed 61.2   06/10/2020 Imaging   1.  Stable exam. No substantial change in the scattered areas of soft tissue thickening in the central pelvis. 2. Interval resolution of the small fluid collection seen in the anterior left pelvis adjacent to small bowel on the previous study. 3. No evidence for new metastatic disease in the chest, abdomen, or pelvis. 4. Diffuse colonic diverticulosis without diverticulitis. 5. Aortic Atherosclerosis (ICD10-I70.0).     REVIEW OF SYSTEMS:   Constitutional: Denies fevers, chills or abnormal weight loss Eyes: Denies blurriness of vision Ears, nose, mouth, throat, and face: Denies mucositis or sore throat Respiratory: Denies cough, dyspnea or wheezes Cardiovascular: Denies palpitation, chest discomfort or lower extremity swelling Skin: Denies abnormal skin rashes Lymphatics: Denies new lymphadenopathy or easy bruising Neurological:Denies numbness, tingling or new weaknesses Behavioral/Psych: Mood is stable, no new changes  All other systems were reviewed with the patient and are negative.  I have reviewed the past medical history, past surgical history, social history and family history with the patient and they are unchanged from previous note.  ALLERGIES:  is allergic to ivp dye [iodinated diagnostic agents], propoxyphene, codeine, and ultram [tramadol hcl].  MEDICATIONS:  Current Outpatient Medications  Medication Sig Dispense Refill  . acetaminophen (TYLENOL) 325 MG tablet Take 650 mg by mouth every 6 (six) hours as needed for mild pain, fever or headache.    . Ascorbic Acid (VITAMIN C PO) Take 1 tablet by mouth daily.    . Biotin 1 MG CAPS Take 1 mg by mouth daily.     . carboxymethylcellulose (REFRESH PLUS) 0.5 % SOLN Place 1 drop into both eyes 3 (three) times daily as needed (for dry eyes.).    Marland Kitchen Cholecalciferol (VITAMIN D3 SUPER STRENGTH) 50 MCG (2000 UT) TABS Take 2,000 Units by mouth daily.    Marland Kitchen HYDROmorphone (DILAUDID) 2 MG tablet Take 1 tablet (2 mg total) by mouth every 4 (four)  hours as needed for severe pain. 60 tablet 0  . lidocaine-prilocaine (EMLA) cream Apply 1 application topically as needed. Port access    . ondansetron (ZOFRAN) 8 MG tablet TAKE 1 TABLET (8 MG TOTAL) BY MOUTH EVERY 8 (EIGHT) HOURS AS NEEDED FOR REFRACTORY NAUSEA / VOMITING. START ON DAY 3 AFTER CHEMO. (Patient taking differently: Take 8 mg by mouth every 8 (eight) hours as needed for nausea or vomiting. ) 30 tablet 1  . polyethylene glycol (MIRALAX / GLYCOLAX) 17 g packet Take 17 g by mouth daily. 14 each 0  . prochlorperazine (COMPAZINE) 10 MG tablet Take 1 tablet (10 mg total) by mouth every 6 (six) hours as needed (Nausea or vomiting). 30 tablet 1  . senna-docusate (SENOKOT-S) 8.6-50 MG tablet Take 2 tablets by mouth 2 (two) times daily. 30 tablet 3   No current facility-administered medications for this visit.    PHYSICAL EXAMINATION: ECOG PERFORMANCE STATUS: 1 - Symptomatic but completely ambulatory  Vitals:   06/10/20 1401  BP: (!) 156/88  Pulse: (!) 113  Resp: 18  Temp: 98.8 F (37.1 C)  SpO2: 99%   Filed Weights   06/10/20 1401  Weight: 156 lb 9.6 oz (71 kg)    GENERAL:alert, no distress and comfortable NEURO: alert & oriented x 3 with fluent speech, no focal motor/sensory deficits  LABORATORY DATA:  I have reviewed the data as listed    Component  Value Date/Time   NA 140 05/27/2020 1035   K 3.7 05/27/2020 1035   CL 108 05/27/2020 1035   CO2 24 05/27/2020 1035   GLUCOSE 121 (H) 05/27/2020 1035   BUN 11 05/27/2020 1035   CREATININE 0.75 05/27/2020 1035   CALCIUM 9.1 05/27/2020 1035   PROT 7.0 05/27/2020 1035   ALBUMIN 3.9 05/27/2020 1035   AST 17 05/27/2020 1035   ALT 14 05/27/2020 1035   ALKPHOS 62 05/27/2020 1035   BILITOT 0.3 05/27/2020 1035   GFRNONAA >60 05/27/2020 1035   GFRAA >60 01/17/2020 1339    No results found for: SPEP, UPEP  Lab Results  Component Value Date   WBC 7.2 05/27/2020   NEUTROABS 3.2 05/27/2020   HGB 12.3 05/27/2020   HCT 38.0  05/27/2020   MCV 93.4 05/27/2020   PLT 197 05/27/2020      Chemistry      Component Value Date/Time   NA 140 05/27/2020 1035   K 3.7 05/27/2020 1035   CL 108 05/27/2020 1035   CO2 24 05/27/2020 1035   BUN 11 05/27/2020 1035   CREATININE 0.75 05/27/2020 1035      Component Value Date/Time   CALCIUM 9.1 05/27/2020 1035   ALKPHOS 62 05/27/2020 1035   AST 17 05/27/2020 1035   ALT 14 05/27/2020 1035   BILITOT 0.3 05/27/2020 1035       RADIOGRAPHIC STUDIES: I have reviewed CT imaging studies with the patient and her husband.  Also reviewed results of echocardiogram I have personally reviewed the radiological images as listed and agreed with the findings in the report. CT CHEST ABDOMEN PELVIS W CONTRAST  Result Date: 06/10/2020 CLINICAL DATA:  Ovarian cancer.  Restaging. EXAM: CT CHEST, ABDOMEN, AND PELVIS WITH CONTRAST TECHNIQUE: Multidetector CT imaging of the chest, abdomen and pelvis was performed following the standard protocol during bolus administration of intravenous contrast. CONTRAST:  147m OMNIPAQUE IOHEXOL 300 MG/ML  SOLN COMPARISON:  [facility] CT abdomen/pelvis exam from 03/07/2020. Chest CT 11/08/2019. FINDINGS: CT CHEST FINDINGS Cardiovascular: The heart size is normal. No substantial pericardial effusion. Coronary artery calcification is evident. Right Port-A-Cath tip is positioned in the upper right atrium. Mediastinum/Nodes: No mediastinal lymphadenopathy. There is no hilar lymphadenopathy. The esophagus has normal imaging features. There is no axillary lymphadenopathy. Lungs/Pleura: Minimal biapical pleuroparenchymal scarring. No suspicious pulmonary nodule or mass. No focal airspace consolidation. No pleural effusion. Musculoskeletal: No worrisome lytic or sclerotic osseous abnormality. CT ABDOMEN PELVIS FINDINGS Hepatobiliary: No suspicious focal abnormality within the liver parenchyma. There is no evidence for gallstones, gallbladder wall thickening, or  pericholecystic fluid. No intrahepatic or extrahepatic biliary dilation. Pancreas: Diffuse parenchymal atrophy without main duct dilatation. Spleen: No splenomegaly. No focal mass lesion. Adrenals/Urinary Tract: No adrenal nodule or mass. Right kidney unremarkable. 19 mm exophytic cyst upper pole left kidney is stable since 03/07/2020. No evidence for hydroureter. The urinary bladder appears normal for the degree of distention. Stomach/Bowel: Stomach is unremarkable. No gastric wall thickening. No evidence of outlet obstruction. Duodenum is normally positioned as is the ligament of Treitz. No small bowel wall thickening. No small bowel dilatation. Diverticuli are seen scattered along the entire length of the colon without CT findings of diverticulitis. Vascular/Lymphatic: There is abdominal aortic atherosclerosis without aneurysm. There is no gastrohepatic or hepatoduodenal ligament lymphadenopathy. No retroperitoneal or mesenteric lymphadenopathy. No pelvic sidewall lymphadenopathy. Reproductive: Uterus surgically absent.  There is no adnexal mass. Other: Soft tissue thickening along the vaginal cuff appears slightly decreased in the interval.  Scarring in the central pelvis along the sigmoid colon is similar. Soft tissue component along the sigmoid colon measuring 3.0 x 1.4 cm today was 2.8 x 1.5 cm (remeasured) on previous CT. Small fluid collection seen in the anterior left pelvis adjacent to small bowel on the previous study has resolved. Musculoskeletal: Stable appearance of the midline small ventral hernia without complicating features. No worrisome lytic or sclerotic osseous abnormality. IMPRESSION: 1. Stable exam. No substantial change in the scattered areas of soft tissue thickening in the central pelvis. 2. Interval resolution of the small fluid collection seen in the anterior left pelvis adjacent to small bowel on the previous study. 3. No evidence for new metastatic disease in the chest, abdomen, or  pelvis. 4. Diffuse colonic diverticulosis without diverticulitis. 5. Aortic Atherosclerosis (ICD10-I70.0). Electronically Signed   By: Misty Stanley M.D.   On: 06/10/2020 13:28   ECHOCARDIOGRAM COMPLETE  Result Date: 05/22/2020    ECHOCARDIOGRAM REPORT   Patient Name:   KATHERYN CULLITON [facility] Date of Exam: 05/22/2020 Medical Rec #:  [id number]             Height:       64.0 in Accession #:    [id number]            Weight:       156.8 lb Date of Birth:  07-19-1940             BSA:          1.764 m Patient Age:    5 years              BP:           140/80 mmHg Patient Gender: F                     HR:           72 bpm. Exam Location:  Outpatient Procedure: 2D Echo, Cardiac Doppler, Color Doppler and Strain Analysis Indications:    Chemo Z09  History:        Patient has prior history of Echocardiogram examinations, most                 recent 01/30/2020. Risk Factors:Non-Smoker. GERD.  Sonographer:    Vickie Epley RDCS Referring Phys: [id number] NI [place]  1. GLS -19.9%.  2. Left ventricular ejection fraction, by estimation, is 55 to 60%. The left ventricle has normal function. The left ventricle has no regional wall motion abnormalities. Left ventricular diastolic parameters were normal.  3. Right ventricular systolic function is normal. The right ventricular size is normal.  4. The mitral valve is normal in structure. Trivial mitral valve regurgitation. No evidence of mitral stenosis.  5. The aortic valve is tricuspid. Aortic valve regurgitation is not visualized. No aortic stenosis is present.  6. The inferior vena cava is normal in size with greater than 50% respiratory variability, suggesting right atrial pressure of 3 mmHg. FINDINGS  Left Ventricle: Left ventricular ejection fraction, by estimation, is 55 to 60%. The left ventricle has normal function. The left ventricle has no regional wall motion abnormalities. The left ventricular internal cavity size was normal in size. There is  no left ventricular  hypertrophy. Left ventricular diastolic parameters were normal. Right Ventricle: The right ventricular size is normal.Right ventricular systolic function is normal. Left Atrium: Left atrial size was normal in size. Right Atrium: Right atrial size was normal in size. Pericardium: There is no evidence of pericardial effusion.  Mitral Valve: The mitral valve is normal in structure. Trivial mitral valve regurgitation. No evidence of mitral valve stenosis. Tricuspid Valve: The tricuspid valve is normal in structure. Tricuspid valve regurgitation is trivial. No evidence of tricuspid stenosis. Aortic Valve: The aortic valve is tricuspid. Aortic valve regurgitation is not visualized. No aortic stenosis is present. Pulmonic Valve: The pulmonic valve was normal in structure. Pulmonic valve regurgitation is trivial. No evidence of pulmonic stenosis. Aorta: The aortic root is normal in size and structure. Venous: The inferior vena cava is normal in size with greater than 50% respiratory variability, suggesting right atrial pressure of 3 mmHg. IAS/Shunts: No atrial level shunt detected by color flow Doppler. Additional Comments: GLS -19.9%.  LEFT VENTRICLE PLAX 2D LVIDd:         4.60 cm     Diastology LVIDs:         3.40 cm     LV e' medial:    8.81 cm/s LV PW:         0.70 cm     LV E/e' medial:  8.6 LV IVS:        0.70 cm     LV e' lateral:   9.57 cm/s LVOT diam:     1.70 cm     LV E/e' lateral: 7.9 LV SV:         46 LV SV Index:   26 LVOT Area:     2.27 cm  LV Volumes (MOD) LV vol d, MOD A2C: 83.0 ml LV vol d, MOD A4C: 76.3 ml LV vol s, MOD A2C: 37.0 ml LV vol s, MOD A4C: 41.8 ml LV SV MOD A2C:     46.0 ml LV SV MOD A4C:     76.3 ml LV SV MOD BP:      42.0 ml RIGHT VENTRICLE RV S prime:     10.90 cm/s TAPSE (M-mode): 1.8 cm LEFT ATRIUM             Index       RIGHT ATRIUM           Index LA diam:        3.00 cm 1.70 cm/m  RA Area:     10.40 cm LA Vol (A2C):   20.7 ml 11.73 ml/m RA Volume:   21.90 ml  12.41 ml/m LA Vol  (A4C):   25.8 ml 14.62 ml/m LA Biplane Vol: 23.9 ml 13.55 ml/m  AORTIC VALVE LVOT Vmax:   108.00 cm/s LVOT Vmean:  68.000 cm/s LVOT VTI:    0.203 m  AORTA Ao Root diam: 2.60 cm MITRAL VALVE MV Area (PHT): 3.60 cm    SHUNTS MV Decel Time: 211 msec    Systemic VTI:  0.20 m MV E velocity: 75.80 cm/s  Systemic Diam: 1.70 cm MV A velocity: 72.40 cm/s MV E/A ratio:  1.05 Kirk Ruths MD Electronically signed by Kirk Ruths MD Signature Date/Time: 05/22/2020/11:16:20 AM    Final

## 2020-06-10 NOTE — Assessment & Plan Note (Signed)
We discussed the importance of aggressive laxatives 

## 2020-06-10 NOTE — Assessment & Plan Note (Signed)
She is aware that treatment goal is palliative in nature

## 2020-06-10 NOTE — Assessment & Plan Note (Signed)
I have reviewed multiple CT imaging with the patient and her husband Overall, she has no clinical signs of disease progression I felt that her skin looks stable/improved The patient felt terrible and difficulties tolerating treatment Recent treatment makes her tired and not feel good We discussed various treatment options We could switch her to either maintenance treatment with bevacizumab versus niraparib However, her tumor marker is not normal One could also make a case to proceed with a few more cycles of chemotherapy and repeat CT imaging in the future We discussed the risk and benefits of bevacizumab and niraparib Ultimately, she is undecided She will call me with decision at the end of the week

## 2020-06-11 ENCOUNTER — Telehealth: Payer: Self-pay | Admitting: Oncology

## 2020-06-11 NOTE — Telephone Encounter (Signed)
Generally, we need a new CT scan whenever we switch Rx. We will reduce dose of treatment to make things tolerable but in general I recommend patients try 2-3 months before switching

## 2020-06-11 NOTE — Telephone Encounter (Signed)
Misty Hopkins called and said she has been thinking about her two options for treatment.  She is wondering if she starts either one and her body is not able to handle it, could she switch to the other option.

## 2020-06-12 NOTE — Telephone Encounter (Signed)
Called Misty Hopkins and advised her of the message below from Dr. Alvy Bimler.  She verbalized understanding and said she will call with her decision about treatment on Monday morning.

## 2020-06-16 ENCOUNTER — Telehealth: Payer: Self-pay | Admitting: Hematology and Oncology

## 2020-06-16 ENCOUNTER — Telehealth: Payer: Self-pay | Admitting: Oncology

## 2020-06-16 ENCOUNTER — Other Ambulatory Visit: Payer: Self-pay | Admitting: Hematology and Oncology

## 2020-06-16 DIAGNOSIS — C5701 Malignant neoplasm of right fallopian tube: Secondary | ICD-10-CM

## 2020-06-16 NOTE — Telephone Encounter (Signed)
Misty Hopkins called and said she has decided to get Avastin.  She said that Dr. Alvy Bimler mentioned that she will need one more cycle of chemotherapy before starting the Avastin and she is wondering why.  She said they discussed this at her last visit but there was a lot going on and she does not remember the reason.

## 2020-06-16 NOTE — Telephone Encounter (Signed)
Misty Hopkins of message below.  She verbalized understanding and would like to schedule her next treatment on 06/24/20 if possible.  Discussed that the schedulers will call her with the appointments.

## 2020-06-16 NOTE — Telephone Encounter (Signed)
It's insurance requirement Earliest I can schedule is 3/1 or 3/4, let me know

## 2020-06-16 NOTE — Telephone Encounter (Signed)
Scheduled appt per 2/21 sch msg - pt is aware of appt date and time .

## 2020-06-19 ENCOUNTER — Other Ambulatory Visit: Payer: Self-pay | Admitting: Hematology and Oncology

## 2020-06-19 ENCOUNTER — Telehealth: Payer: Self-pay | Admitting: Oncology

## 2020-06-19 ENCOUNTER — Other Ambulatory Visit: Payer: Self-pay

## 2020-06-19 ENCOUNTER — Inpatient Hospital Stay: Payer: Medicare HMO

## 2020-06-19 DIAGNOSIS — Z7189 Other specified counseling: Secondary | ICD-10-CM

## 2020-06-19 DIAGNOSIS — R300 Dysuria: Secondary | ICD-10-CM | POA: Insufficient documentation

## 2020-06-19 DIAGNOSIS — C569 Malignant neoplasm of unspecified ovary: Secondary | ICD-10-CM

## 2020-06-19 DIAGNOSIS — C5701 Malignant neoplasm of right fallopian tube: Secondary | ICD-10-CM

## 2020-06-19 DIAGNOSIS — Z5111 Encounter for antineoplastic chemotherapy: Secondary | ICD-10-CM | POA: Diagnosis not present

## 2020-06-19 LAB — URINALYSIS, COMPLETE (UACMP) WITH MICROSCOPIC
Bilirubin Urine: NEGATIVE
Glucose, UA: NEGATIVE mg/dL
Ketones, ur: NEGATIVE mg/dL
Nitrite: POSITIVE — AB
Protein, ur: NEGATIVE mg/dL
Specific Gravity, Urine: 1.005 (ref 1.005–1.030)
WBC, UA: >50 WBC/hpf — ABNORMAL HIGH (ref 0–5)
pH: 6 (ref 5.0–8.0)

## 2020-06-19 MED ORDER — AMOXICILLIN 500 MG PO TABS: 500.0000 mg | ORAL_TABLET | Freq: Two times a day (BID) | ORAL | 0 refills | Status: DC

## 2020-06-19 MED ORDER — FLUCONAZOLE 150 MG PO TABS: 150.0000 mg | ORAL_TABLET | Freq: Every day | ORAL | 0 refills | Status: DC

## 2020-06-19 NOTE — Telephone Encounter (Signed)
Called Vaughan Basta and she will be here for lab at 10:15.

## 2020-06-19 NOTE — Telephone Encounter (Signed)
Misty Hopkins called and said she woke up with a burning/pulling sensation when she urinates.  She also feels like she is not completely emptying her bladder.  She did take an AZO and had some relief.    She has a history of interstitial cystitis and sees Dr. Amalia Hailey with [facility].  She hasn't had any issues or had to see Dr. Amalia Hailey since chemotherapy started 2 years ago.  She said it doesn't feel like that yet where she had pain and pressure in her bladder.  She is willing to come in today to give a urine sample if needed.

## 2020-06-19 NOTE — Telephone Encounter (Signed)
OK I will also send 1 dose of diflucan if that happens she can take it

## 2020-06-19 NOTE — Telephone Encounter (Signed)
Called Misty Hopkins and advised her of message from Dr. Alvy Bimler.  She will start the amoxicillin today but said she always gets a yeast infection when she takes it.  She is wondering if Dr. Alvy Bimler could recommend what to take in case she does get a yeast infection.

## 2020-06-19 NOTE — Telephone Encounter (Signed)
I ordered UA and UCx stat, please schedule lab only appt, will call her with results tomorrow

## 2020-06-19 NOTE — Telephone Encounter (Signed)
Called Misty Hopkins and let her know Dr. Alvy Bimler has sent in Diflucan to use if she gets a yeast infection.  She verbalized agreement.

## 2020-06-23 ENCOUNTER — Telehealth: Payer: Self-pay

## 2020-06-23 LAB — URINE CULTURE: Culture: >=100000 — AB

## 2020-06-23 NOTE — Telephone Encounter (Signed)
-----   Message from [name], MD sent at 06/23/2020  8:31 AM EST ----- Regarding: how is she URine culture showed it is sensitive to amoxicillin Is she better?

## 2020-06-23 NOTE — Telephone Encounter (Signed)
Called and given below message. She is feeling better and has one more antibiotic to take. Reviewed tomorrow appts at [facility]. She verbalized understanding.

## 2020-06-24 ENCOUNTER — Encounter: Payer: Self-pay | Admitting: Hematology and Oncology

## 2020-06-24 ENCOUNTER — Other Ambulatory Visit: Payer: Self-pay | Admitting: Hematology and Oncology

## 2020-06-24 ENCOUNTER — Inpatient Hospital Stay (HOSPITAL_BASED_OUTPATIENT_CLINIC_OR_DEPARTMENT_OTHER): Payer: Medicare HMO | Admitting: Hematology and Oncology

## 2020-06-24 ENCOUNTER — Other Ambulatory Visit: Payer: Self-pay

## 2020-06-24 ENCOUNTER — Inpatient Hospital Stay: Payer: Medicare HMO

## 2020-06-24 ENCOUNTER — Telehealth: Payer: Self-pay | Admitting: Hematology and Oncology

## 2020-06-24 ENCOUNTER — Inpatient Hospital Stay: Payer: Medicare HMO | Attending: Hematology and Oncology

## 2020-06-24 VITALS — BP 170/79 | HR 81 | Temp 98.4°F | Resp 18 | Ht 64.0 in | Wt 156.8 lb

## 2020-06-24 VITALS — BP 139/70 | HR 77

## 2020-06-24 DIAGNOSIS — R030 Elevated blood-pressure reading, without diagnosis of hypertension: Secondary | ICD-10-CM

## 2020-06-24 DIAGNOSIS — C5701 Malignant neoplasm of right fallopian tube: Secondary | ICD-10-CM

## 2020-06-24 DIAGNOSIS — Z79899 Other long term (current) drug therapy: Secondary | ICD-10-CM | POA: Diagnosis not present

## 2020-06-24 DIAGNOSIS — R0981 Nasal congestion: Secondary | ICD-10-CM | POA: Diagnosis not present

## 2020-06-24 DIAGNOSIS — Z5112 Encounter for antineoplastic immunotherapy: Secondary | ICD-10-CM | POA: Diagnosis present

## 2020-06-24 DIAGNOSIS — C561 Malignant neoplasm of right ovary: Secondary | ICD-10-CM | POA: Insufficient documentation

## 2020-06-24 DIAGNOSIS — R21 Rash and other nonspecific skin eruption: Secondary | ICD-10-CM | POA: Insufficient documentation

## 2020-06-24 DIAGNOSIS — Z5111 Encounter for antineoplastic chemotherapy: Secondary | ICD-10-CM | POA: Diagnosis present

## 2020-06-24 DIAGNOSIS — R300 Dysuria: Secondary | ICD-10-CM | POA: Diagnosis not present

## 2020-06-24 DIAGNOSIS — C569 Malignant neoplasm of unspecified ovary: Secondary | ICD-10-CM

## 2020-06-24 DIAGNOSIS — D61818 Other pancytopenia: Secondary | ICD-10-CM | POA: Insufficient documentation

## 2020-06-24 DIAGNOSIS — K5730 Diverticulosis of large intestine without perforation or abscess without bleeding: Secondary | ICD-10-CM | POA: Insufficient documentation

## 2020-06-24 DIAGNOSIS — K439 Ventral hernia without obstruction or gangrene: Secondary | ICD-10-CM | POA: Diagnosis not present

## 2020-06-24 DIAGNOSIS — I700 Atherosclerosis of aorta: Secondary | ICD-10-CM | POA: Insufficient documentation

## 2020-06-24 DIAGNOSIS — K5909 Other constipation: Secondary | ICD-10-CM | POA: Diagnosis not present

## 2020-06-24 DIAGNOSIS — Z885 Allergy status to narcotic agent status: Secondary | ICD-10-CM | POA: Diagnosis not present

## 2020-06-24 DIAGNOSIS — Z7189 Other specified counseling: Secondary | ICD-10-CM

## 2020-06-24 DIAGNOSIS — J984 Other disorders of lung: Secondary | ICD-10-CM | POA: Insufficient documentation

## 2020-06-24 LAB — CBC WITH DIFFERENTIAL (CANCER CENTER ONLY)
Abs Immature Granulocytes: 0.05 10*3/uL (ref 0.00–0.07)
Basophils Absolute: 0 10*3/uL (ref 0.0–0.1)
Basophils Relative: 1 %
Eosinophils Absolute: 0.1 10*3/uL (ref 0.0–0.5)
Eosinophils Relative: 2 %
HCT: 35 % — ABNORMAL LOW (ref 36.0–46.0)
Hemoglobin: 11.5 g/dL — ABNORMAL LOW (ref 12.0–15.0)
Immature Granulocytes: 1 %
Lymphocytes Relative: 36 %
Lymphs Abs: 2.4 10*3/uL (ref 0.7–4.0)
MCH: 30.4 pg (ref 26.0–34.0)
MCHC: 32.9 g/dL (ref 30.0–36.0)
MCV: 92.6 fL (ref 80.0–100.0)
Monocytes Absolute: 1 10*3/uL (ref 0.1–1.0)
Monocytes Relative: 15 %
Neutro Abs: 2.9 10*3/uL (ref 1.7–7.7)
Neutrophils Relative %: 45 %
Platelet Count: 144 10*3/uL — ABNORMAL LOW (ref 150–400)
RBC: 3.78 MIL/uL — ABNORMAL LOW (ref 3.87–5.11)
RDW: 15 % (ref 11.5–15.5)
WBC Count: 6.5 10*3/uL (ref 4.0–10.5)
nRBC: 0 % (ref 0.0–0.2)

## 2020-06-24 LAB — TOTAL PROTEIN, URINE DIPSTICK: Protein, ur: NEGATIVE mg/dL

## 2020-06-24 LAB — CMP (CANCER CENTER ONLY)
ALT: 17 U/L (ref 0–44)
AST: 20 U/L (ref 15–41)
Albumin: 3.7 g/dL (ref 3.5–5.0)
Alkaline Phosphatase: 54 U/L (ref 38–126)
Anion gap: 9 (ref 5–15)
BUN: 12 mg/dL (ref 8–23)
CO2: 25 mmol/L (ref 22–32)
Calcium: 9 mg/dL (ref 8.9–10.3)
Chloride: 108 mmol/L (ref 98–111)
Creatinine: 0.7 mg/dL (ref 0.44–1.00)
GFR, Estimated: >60 mL/min (ref >60)
Glucose, Bld: 100 mg/dL — ABNORMAL HIGH (ref 70–99)
Potassium: 3.7 mmol/L (ref 3.5–5.1)
Sodium: 142 mmol/L (ref 135–145)
Total Bilirubin: 0.3 mg/dL (ref 0.3–1.2)
Total Protein: 6.7 g/dL (ref 6.5–8.1)

## 2020-06-24 MED ORDER — SODIUM CHLORIDE 0.9% FLUSH
10.0000 mL | INTRAVENOUS | Status: DC | PRN
  Administered 2020-06-24: 10 mL
  Filled 2020-06-24: qty 10

## 2020-06-24 MED ORDER — SODIUM CHLORIDE 0.9 % IV SOLN
15.0000 mg/kg | Freq: Once | INTRAVENOUS | Status: AC
  Administered 2020-06-24: 1100 mg via INTRAVENOUS
  Filled 2020-06-24: qty 32

## 2020-06-24 MED ORDER — FAMOTIDINE IN NACL 20-0.9 MG/50ML-% IV SOLN
20.0000 mg | Freq: Once | INTRAVENOUS | Status: AC
Start: 2020-06-24 — End: 2020-06-24
  Administered 2020-06-24: 20 mg via INTRAVENOUS

## 2020-06-24 MED ORDER — DIPHENHYDRAMINE HCL 50 MG/ML IJ SOLN
25.0000 mg | Freq: Once | INTRAMUSCULAR | Status: AC
  Administered 2020-06-24: 25 mg via INTRAVENOUS

## 2020-06-24 MED ORDER — SODIUM CHLORIDE 0.9 % IV SOLN
150.0000 mg | Freq: Once | INTRAVENOUS | Status: AC
  Administered 2020-06-24: 150 mg via INTRAVENOUS
  Filled 2020-06-24: qty 150

## 2020-06-24 MED ORDER — PALONOSETRON HCL INJECTION 0.25 MG/5ML
INTRAVENOUS | Status: AC
  Filled 2020-06-24: qty 5

## 2020-06-24 MED ORDER — PALONOSETRON HCL INJECTION 0.25 MG/5ML
0.2500 mg | Freq: Once | INTRAVENOUS | Status: AC
Start: 2020-06-24 — End: 2020-06-24
  Administered 2020-06-24: 0.25 mg via INTRAVENOUS

## 2020-06-24 MED ORDER — DIPHENHYDRAMINE HCL 50 MG/ML IJ SOLN
INTRAMUSCULAR | Status: AC
  Filled 2020-06-24: qty 1

## 2020-06-24 MED ORDER — DEXTROSE 5 % IV SOLN
Freq: Once | INTRAVENOUS | Status: AC
  Filled 2020-06-24: qty 250

## 2020-06-24 MED ORDER — FAMOTIDINE IN NACL 20-0.9 MG/50ML-% IV SOLN
INTRAVENOUS | Status: AC
  Filled 2020-06-24: qty 50

## 2020-06-24 MED ORDER — SODIUM CHLORIDE 0.9% FLUSH
10.0000 mL | Freq: Once | INTRAVENOUS | Status: AC
  Administered 2020-06-24: 10 mL
  Filled 2020-06-24: qty 10

## 2020-06-24 MED ORDER — DOXORUBICIN HCL LIPOSOMAL CHEMO INJECTION 2 MG/ML
28.0000 mg/m2 | Freq: Once | INTRAVENOUS | Status: AC
  Administered 2020-06-24: 50 mg via INTRAVENOUS
  Filled 2020-06-24: qty 25

## 2020-06-24 MED ORDER — HEPARIN SOD (PORK) LOCK FLUSH 100 UNIT/ML IV SOLN
500.0000 [IU] | Freq: Once | INTRAVENOUS | Status: AC | PRN
  Administered 2020-06-24: 500 [IU]
  Filled 2020-06-24: qty 5

## 2020-06-24 MED ORDER — SODIUM CHLORIDE 0.9 % IV SOLN
389.5000 mg | Freq: Once | INTRAVENOUS | Status: AC
  Administered 2020-06-24: 390 mg via INTRAVENOUS
  Filled 2020-06-24: qty 39

## 2020-06-24 MED ORDER — SODIUM CHLORIDE 0.9 % IV SOLN
10.0000 mg | Freq: Once | INTRAVENOUS | Status: AC
  Administered 2020-06-24: 10 mg via INTRAVENOUS
  Filled 2020-06-24: qty 10

## 2020-06-24 MED ORDER — SODIUM CHLORIDE 0.9 % IV SOLN
INTRAVENOUS | Status: DC
  Filled 2020-06-24: qty 250

## 2020-06-24 NOTE — Progress Notes (Signed)
Okay to treat with elevated BP per Dr. Alvy Bimler.

## 2020-06-24 NOTE — Assessment & Plan Note (Signed)
Previously, we have discussed the use of bevacizumab as maintenance treatment We are adding bevacizumab with chemo today and after today's treatment, she will only use bevacizumab only in the future She is instructed to check her blood pressure twice a day and I will set up virtual visit next week to follow-up on blood pressure measurement and adjust her medication as needed

## 2020-06-24 NOTE — Telephone Encounter (Signed)
Scheduled appts per 3/1 sch msg. Gave pt a print out of avs and appt calendars.

## 2020-06-24 NOTE — Assessment & Plan Note (Signed)
According to the patient, blood pressure at home is within normal limits She is instructed to check her blood pressure twice a day I will follow with her blood pressure monitoring next week We will proceed with treatment without delay

## 2020-06-24 NOTE — Assessment & Plan Note (Signed)
She has mild pancytopenia due to recent treatment but not symptomatic Observe only We will proceed with treatment as scheduled

## 2020-06-24 NOTE — Patient Instructions (Signed)
[facility] Discharge Instructions for Patients Receiving Chemotherapy  Today you received the following chemotherapy agents: Liposomal doxorubicin and carboplatin and Bevacizumab  To help prevent nausea and vomiting after your treatment, we encourage you to take your nausea medication as directed.   If you develop nausea and vomiting that is not controlled by your nausea medication, call the clinic.   BELOW ARE SYMPTOMS THAT SHOULD BE REPORTED IMMEDIATELY:  *FEVER GREATER THAN 100.5 F  *CHILLS WITH OR WITHOUT FEVER  NAUSEA AND VOMITING THAT IS NOT CONTROLLED WITH YOUR NAUSEA MEDICATION  *UNUSUAL SHORTNESS OF BREATH  *UNUSUAL BRUISING OR BLEEDING  TENDERNESS IN MOUTH AND THROAT WITH OR WITHOUT PRESENCE OF ULCERS  *URINARY PROBLEMS  *BOWEL PROBLEMS  UNUSUAL RASH Items with * indicate a potential emergency and should be followed up as soon as possible.  Feel free to call the clinic should you have any questions or concerns. The clinic phone number is [phone].  Please show the [facility] at check-in to the Emergency Department and triage nurse.  Bevacizumab injection What is this medicine? BEVACIZUMAB (be va SIZ yoo mab) is a monoclonal antibody. It is used to treat many types of cancer. This medicine may be used for other purposes; ask your health care provider or pharmacist if you have questions. COMMON BRAND NAME(S): Avastin, MVASI, Zirabev What should I tell my health care provider before I take this medicine? They need to know if you have any of these conditions:  diabetes  heart disease  high blood pressure  history of coughing up blood  prior anthracycline chemotherapy (e.g., doxorubicin, daunorubicin, epirubicin)  recent or ongoing radiation therapy  recent or planning to have surgery  stroke  an unusual or allergic reaction to bevacizumab, hamster proteins, mouse proteins, other medicines, foods, dyes, or  preservatives  pregnant or trying to get pregnant  breast-feeding How should I use this medicine? This medicine is for infusion into a vein. It is given by a health care professional in a hospital or clinic setting. Talk to your pediatrician regarding the use of this medicine in children. Special care may be needed. Overdosage: If you think you have taken too much of this medicine contact a poison control center or emergency room at once. NOTE: This medicine is only for you. Do not share this medicine with others. What if I miss a dose? It is important not to miss your dose. Call your doctor or health care professional if you are unable to keep an appointment. What may interact with this medicine? Interactions are not expected. This list may not describe all possible interactions. Give your health care provider a list of all the medicines, herbs, non-prescription drugs, or dietary supplements you use. Also tell them if you smoke, drink alcohol, or use illegal drugs. Some items may interact with your medicine. What should I watch for while using this medicine? Your condition will be monitored carefully while you are receiving this medicine. You will need important blood work and urine testing done while you are taking this medicine. This medicine may increase your risk to bruise or bleed. Call your doctor or health care professional if you notice any unusual bleeding. Before having surgery, talk to your health care provider to make sure it is ok. This drug can increase the risk of poor healing of your surgical site or wound. You will need to stop this drug for 28 days before surgery. After surgery, wait at least 28 days before restarting this  drug. Make sure the surgical site or wound is healed enough before restarting this drug. Talk to your health care provider if questions. Do not become pregnant while taking this medicine or for 6 months after stopping it. Women should inform their doctor if  they wish to become pregnant or think they might be pregnant. There is a potential for serious side effects to an unborn child. Talk to your health care professional or pharmacist for more information. Do not breast-feed an infant while taking this medicine and for 6 months after the last dose. This medicine has caused ovarian failure in some women. This medicine may interfere with the ability to have a child. You should talk to your doctor or health care professional if you are concerned about your fertility. What side effects may I notice from receiving this medicine? Side effects that you should report to your doctor or health care professional as soon as possible:  allergic reactions like skin rash, itching or hives, swelling of the face, lips, or tongue  chest pain or chest tightness  chills  coughing up blood  high fever  seizures  severe constipation  signs and symptoms of bleeding such as bloody or black, tarry stools; red or dark-brown urine; spitting up blood or brown material that looks like coffee grounds; red spots on the skin; unusual bruising or bleeding from the eye, gums, or nose  signs and symptoms of a blood clot such as breathing problems; chest pain; severe, sudden headache; pain, swelling, warmth in the leg  signs and symptoms of a stroke like changes in vision; confusion; trouble speaking or understanding; severe headaches; sudden numbness or weakness of the face, arm or leg; trouble walking; dizziness; loss of balance or coordination  stomach pain  sweating  swelling of legs or ankles  vomiting  weight gain Side effects that usually do not require medical attention (report to your doctor or health care professional if they continue or are bothersome):  back pain  changes in taste  decreased appetite  dry skin  nausea  tiredness This list may not describe all possible side effects. Call your doctor for medical advice about side effects. You may  report side effects to FDA at 1-800-FDA-1088. Where should I keep my medicine? This drug is given in a hospital or clinic and will not be stored at home. NOTE: This sheet is a summary. It may not cover all possible information. If you have questions about this medicine, talk to your doctor, pharmacist, or health care provider.  2021 [employer]/Gold Standard (2019-02-07 10:50:46)

## 2020-06-24 NOTE — Progress Notes (Signed)
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[facility]) Previously, we have discussed the use of bevacizumab as maintenance treatment We are adding bevacizumab with chemo today and after today's treatment, she will only use bevacizumab only in the future She is instructed to check her blood pressure twice a day and I will set up virtual visit next week to follow-up on blood pressure measurement and adjust her medication as needed  Elevated BP without diagnosis of hypertension According to the patient, blood pressure at home is within normal limits She is instructed to check her blood pressure twice a day I will follow with her blood pressure monitoring next week We will proceed with treatment without delay  Pancytopenia, acquired [facility]) She has mild pancytopenia due to recent treatment but not symptomatic Observe only We will proceed with treatment as scheduled   Orders Placed This Encounter  Procedures   CA 125    Standing Status:   Standing    Number of Occurrences:   11    Standing Expiration Date:   06/24/2021    All questions were answered. The patient knows to call the clinic with any problems, questions or concerns. The total time spent in the appointment was 20 minutes encounter with patients including review of chart and various tests results, discussions about plan of care and coordination of care plan   [name], MD 06/24/2020 12:13 PM  INTERVAL HISTORY: Please see below for problem oriented charting. She returns for chemo and follow-up She is doing well Her urinary symptoms has resolved She has no recent bleeding Her appetite is stable  SUMMARY OF ONCOLOGIC HISTORY: Oncology History Overview Note  High grade serous, right fallopian tube  HRD, BRCA tumor testing were  neg   Ovarian cancer ([place])  03/21/2018 Initial Diagnosis   Ovarian cancer ([place])   02/04/2020 -  Chemotherapy   The patient had carboplatin and doxil for chemotherapy treatment.     Fallopian tube cancer, carcinoma, right ([place])  10/24/2017 Initial Diagnosis   She has presentation of vague abdominal pain   02/20/2018 Imaging   Outside CT abdomen and pelvis: This revealed an incidental finding of a 1.5 cm subcapsular lesion in the lateral upper pole of the left kidney which measured higher than fluid attenuation which could represent a proteinaceous renal cyst or solid renal mass.  There was no ascites.  There is no lymphadenopathy.  There were no masses in the pelvis including ovarian or adnexal masses seen.  There was however soft tissue stranding seen with nodularity in the omental fat in the lower abdomen without evidence of ascites.  This was felt to represent either carcinomatosis or inflammatory infectious etiologies.  There was colonic diverticulosis seen without radiographic evidence of diverticulitis.  The radiologist recommended MRI of the abdomen to further work-up the renal mass   02/22/2018 Tumor Marker   Patient's tumor was tested for the following markers: CA-125. Results of the tumor marker test revealed 103.   03/06/2018 Pathology Results   Omentum, biopsy - ADENOCARCINOMA WITH PSAMMOMA BODIES, SEE COMMENT. Microscopic Comment There are scattered small foci of malignant glands with associated psammoma bodies. While the tumor is somewhat limited the cells have a more low grade appearance. There is prominent lymphovascular invasion. Immunohistochemistry reveals the cells are positive for cytokeratin 7, ER, MOC31, PAX8, and  WT-1. They are negative for calretinin, cytokeratin 20, CDX-2, and TTF-1. Overall, the findings are consistent with adenocarcinoma. The differential includes a primary gynecologic or peritoneal tumor, although the morphology is not typical of a high grade serous  carcinoma.   03/06/2018 Surgery   Surgeon: Donaciano Eva   Pre-operative Diagnosis: omental mass, elevated CA 125 Operation: Laparoscopic omentectomy  Surgeon: Donaciano Eva  Operative Findings:  : normal diaphragm and upper omentum. Sigmoid colon densely adherent to left pelvic side wall consistent with history of diverticulitis. Omentum adherent to anterior abdominal wall and sigmoid colon. Surgically absent uterus. Right ovary very small and grossly normal. Left ovary obscured by adhesed sigmoid colon. No ascites. No carcinomatosis. There was a nodular firm distal omentum on the left which was adherent to the sigmoid colon and most consistent with inflammatory change.      03/21/2018 Pathology Results   1. Soft tissue mass, simple excision, midline abdominal wall mass - METASTATIC SEROUS CARCINOMA. 2. Omentum, resection for tumor - METASTATIC SEROUS CARCINOMA. 3. Adnexa - ovary +/- tube, neoplastic, right - RIGHT FALLOPIAN TUBE: -HIGH GRADE SEROUS CARCINOMA. -SEE ONCOLOGY TABLE. - RIGHT OVARY: SURFACE PSAMMOMA BODIES. NO DEFINITIVE MALIGNANT CELLS. -INCLUSION CYSTS. 4. Adnexa - ovary +/- tube, neoplastic, left - LEFT FALLOPIAN TUBE: LUMINAL AND SURFACE SEROUS CARCINOMA. - LEFT OVARY: FOCAL PSAMMOMA BODIES AND MALIGNANT CELLS. 5. Soft tissue mass, simple excision, left abdominal wall - METASTATIC SEROUS CARCINOMA. Microscopic Comment 3. OVARY or FALLOPIAN TUBE or PRIMARY PERITONEUM: Procedure: Bilateral salpingo-oophorectomy. Abdominal wall mass excision and omentectomy. Specimen Integrity: Intact. Tumor Site: Right fallopian tube. Ovarian Surface Involvement (required only if applicable): Present (left). Fallopian Tube Surface Involvement (required only if applicable): Present. Tumor Size: 1.5 cm. Histologic Type: High grade serous carcinoma. Histologic Grade: High grade. Implants (required for advanced stage serous/seromucinous borderline tumors  only): Present. Other Tissue/ Organ Involvement: Omentum, abdominal wall, left fallopian tube and ovary. Largest Extrapelvic Peritoneal Focus (required only if applicable): >2 cm. Peritoneal/Ascitic Fluid: N/A. Treatment Effect (required only for high-grade serous carcinomas): N/A. Regional Lymph Nodes: No lymph nodes submitted or found Pathologic Stage Classification (pTNM, AJCC 8th Edition): pT3c, pNX Representative Tumor Block: 3A Comment(s): None.   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   Surgeon: Thereasa Solo, MD. Specimens: Bilateral tubes / ovaries, omentum. Midline abdominal wall mass, left lateral abdominal wall mass.    Operative Findings:  Abdominal wall masses consistent with port site metastases at the umbilical incision (7cm) and left lateral incision (5cm). Omental caking in the infracolic omentum. Grossly normal very small tubes and ovaries. Normal diaphragms. No lymphadenopathy. Normal small and large intestine with the exception of miliary studding of tumor on the surface of the sigmoid colon and rectum in the cul de sac.    This represented an optimal cytoreduction (R1) with no gross visible disease remaining, however there was a thin rind of tumor on the lateral left fascia associated with the port site metastasis.     04/11/2018 Cancer Staging   Staging form: Ovary, Fallopian Tube, and Primary Peritoneal Carcinoma, AJCC 8th Edition - Pathologic: FIGO Stage IIIC (pT3, pN0, cM0) - Signed by [name], MD on 04/11/2018   05/23/2018 Imaging   1. Interval resolution of the anterior midline abdominal wall seroma. There is some persistent wispy soft tissue attenuation in the region of the  midline incision, nonspecific and may simply reflect  granulation tissue from surgery. 2. Stable 13 mm exophytic lesion posterior left kidney with attenuation higher than expected for a simple cyst. This may be a cyst complicated by proteinaceous debris or hemorrhage, but attention on follow-up recommended. 3. Stable mild persistent distention of proximal jejunal loops with gradual tapering to nondilated distal small bowel. 4. No overt peritoneal or mesenteric disease identified on this exam.   05/23/2018 Tumor Marker   Patient's tumor was tested for the following markers: CA-125 Results of the tumor marker test revealed 11.6   06/02/2018 - 09/20/2018 Chemotherapy   The patient had carboplatin and taxol   06/19/2018 Procedure   Status post right IJ port catheter placement. Catheter ready for use.   06/26/2018 Tumor Marker   Patient's tumor was tested for the following markers: CA-125. Results of the tumor marker test revealed 10.7   09/20/2018 Tumor Marker   Patient's tumor was tested for the following markers: CA-125. Results of the tumor marker test revealed 9.1   10/25/2018 Imaging   1. There is suspicious, abnormal asymmetric increased soft tissue involving the cecum and ascending colon. This is suspicious for residual/progressive serosal involvement by tumor. 2. No additional sites of disease identified. No evidence for nodal metastasis, solid organ metastasis or ascites.     11/07/2018 Procedure   She had negative colonoscopy evaluation   12/05/2018 Tumor Marker   Patient's tumor was tested for the following markers: CA-125 Results of the tumor marker test revealed 8.6   01/25/2019 Imaging   Ct abdomen and pelvis Signs of omentectomy and hysterectomy without definitive signs of residual recurrent disease. Small nodular focus bridging rectum and vagina is stable dating back January to 12/14/2018, potentially postoperative but suggest attention on subsequent imaging.   01/25/2019 Tumor Marker   Patient's tumor was tested for the following  markers: CA-125 Results of the tumor marker test revealed 10.3   03/15/2019 Tumor Marker   Patient's tumor was tested for the following markers: CA-125 Results of the tumor marker test revealed 9.6   03/23/2019 Imaging   No acute findings in the abdomen or pelvis.   Colonic diverticulosis.  No active diverticulitis.   Prior open tectum E and hysterectomy.   Aortic atherosclerosis.     05/10/2019 Tumor Marker   Patient's tumor was tested for the following markers: CA-125 Results of the tumor marker test revealed 11.8   05/22/2019 Imaging   1. Stable exam. No evidence of recurrent or metastatic carcinoma within the abdomen or pelvis. 2. Colonic diverticulosis, without radiographic evidence of diverticulitis.     11/08/2019 Imaging   1. Status post hysterectomy and bilateral oophorectomy. 2. Soft tissue thickening within the deep pelvis which when compared to multiple prior exams is felt to be slowly progressive. Especially given the elevated CA 125 level, this is suspicious for peritoneal recurrence. Potential imaging strategies include PET (which may be of low sensitivity secondary to the lack of well-defined dominant mass) or pelvic CT follow-up at 3 months. 3. Otherwise, no evidence of metastatic disease within the chest, abdomen, or pelvis. 4. Coronary artery atherosclerosis. Aortic Atherosclerosis (ICD10-I70.0). 5. Ventral abdominal wall hernia containing nonobstructive transverse colon, as before. 6.  Possible constipation.   12/21/2019 Tumor Marker   Patient's tumor was tested for the following markers: 95.2. Results of the tumor marker test revealed CA-`125.   01/17/2020 Imaging   1. Similar appearance of nodularity in the central pelvis along the vaginal cuff. There is a particular  nodular soft tissue density measuring 2.2 x 1.6 cm along the wall of the proximal rectum that is slightly more conspicuous than previously, raising concern for possible minimal progression. Similar  appearance of the other areas of nodular soft tissue in the central pelvis. PET-CT may prove helpful to further evaluate. 2. No ascites. 3. Left colonic diverticulosis without diverticulitis. 4. 1.7 cm exophytic lesion upper pole left kidney with well-defined homogeneous attenuation slightly higher than would be expected for simple fluid. This is probably a complex cyst. Attention on follow-up recommended. 5. Aortic Atherosclerosis (ICD10-I70.0).   01/17/2020 Tumor Marker   Patient's tumor was tested for the following markers: CA-125 Results of the tumor marker test revealed 135   01/30/2020 Echocardiogram   1. Left ventricular ejection fraction, by estimation, is 55 to 60%. Left ventricular ejection fraction by PLAX is 55 %. The left ventricle has normal function. The left ventricle has no regional wall motion abnormalities. Left ventricular diastolic parameters are indeterminate.  2. Right ventricular systolic function is normal. The right ventricular size is normal.  3. Left atrial size was borderline dilated.  4. The mitral valve is grossly normal. Trivial mitral valve regurgitation.  5. The aortic valve was not well visualized. Aortic valve regurgitation is trivial.  6. The inferior vena cava is normal in size with greater than 50% respiratory variability, suggesting right atrial pressure of 3 mmHg.   02/04/2020 -  Chemotherapy   The patient had carboplatin and doxil for chemotherapy treatment.     03/03/2020 Tumor Marker   Patient's tumor was tested for the following markers: CA-125 Results of the tumor marker test revealed 272   03/07/2020 Procedure   1. Aspiration of left lower quadrant fluid collection yields 5 mL simple straw-colored serous fluid. 2. As the fluid appears to be simple in nature, no drainage catheter was placed. 3. The aspirated fluid was sent 4 body fluid culture to confirm its sterility.   03/07/2020 Imaging   VQ scan No perfusion defects evident. No findings  indicative of pulmonary embolus.   03/07/2020 Imaging   Outside CT Multiple serosal implants again noted within the pelvis. However, increasing peritoneal nodularity and peritoneal thickening noted within the flanks and right upper quadrant as well as developing trace ascites within the mesentery all suspicious for progressive metastatic disease. Correlation with tumor markers may be helpful for further management.   Interval development of a 3.2 cm fluid collection within the left lower quadrant. Given its relatively rapid development, and alternative etiology such as a diverticular abscess should be considered. Recurrent disease, particular given the patient's history of serous adenocarcinoma, is not definitively excluded   03/31/2020 Tumor Marker   Patient's tumor was tested for the following markers: CA-125. Results of the tumor marker test revealed 129   04/29/2020 Tumor Marker   Patient's tumor was tested for the following markers: CA-125 Results of the tumor marker test revealed 66.5   05/22/2020 Echocardiogram   1. GLS -19.9%.  2. Left ventricular ejection fraction, by estimation, is 55 to 60%. The left ventricle has normal function. The left ventricle has no regional wall motion abnormalities. Left ventricular diastolic parameters were normal.  3. Right ventricular systolic function is normal. The right ventricular size is normal.  4. The mitral valve is normal in structure. Trivial mitral valve regurgitation. No evidence of mitral stenosis.  5. The aortic valve is tricuspid. Aortic valve regurgitation is not visualized. No aortic stenosis is present.  6. The inferior vena cava is normal in  size with greater than 50% respiratory variability, suggesting right atrial pressure of 3 mmHg.   05/27/2020 Tumor Marker   Patient's tumor was tested for the following markers: CA-125 Results of the tumor marker test revealed 61.2   06/10/2020 Imaging   1. Stable exam. No substantial change in the  scattered areas of soft tissue thickening in the central pelvis. 2. Interval resolution of the small fluid collection seen in the anterior left pelvis adjacent to small bowel on the previous study. 3. No evidence for new metastatic disease in the chest, abdomen, or pelvis. 4. Diffuse colonic diverticulosis without diverticulitis. 5. Aortic Atherosclerosis (ICD10-I70.0).     REVIEW OF SYSTEMS:   Constitutional: Denies fevers, chills or abnormal weight loss Eyes: Denies blurriness of vision Ears, nose, mouth, throat, and face: Denies mucositis or sore throat Respiratory: Denies cough, dyspnea or wheezes Cardiovascular: Denies palpitation, chest discomfort or lower extremity swelling Gastrointestinal:  Denies nausea, heartburn or change in bowel habits Skin: Denies abnormal skin rashes Lymphatics: Denies new lymphadenopathy or easy bruising Neurological:Denies numbness, tingling or new weaknesses Behavioral/Psych: Mood is stable, no new changes  All other systems were reviewed with the patient and are negative.  I have reviewed the past medical history, past surgical history, social history and family history with the patient and they are unchanged from previous note.  ALLERGIES:  is allergic to ivp dye [iodinated diagnostic agents], propoxyphene, codeine, and ultram [tramadol hcl].  MEDICATIONS:  Current Outpatient Medications  Medication Sig Dispense Refill   acetaminophen (TYLENOL) 325 MG tablet Take 650 mg by mouth every 6 (six) hours as needed for mild pain, fever or headache.     Ascorbic Acid (VITAMIN C PO) Take 1 tablet by mouth daily.     Biotin 1 MG CAPS Take 1 mg by mouth daily.      carboxymethylcellulose (REFRESH PLUS) 0.5 % SOLN Place 1 drop into both eyes 3 (three) times daily as needed (for dry eyes.).     Cholecalciferol (VITAMIN D3 SUPER STRENGTH) 50 MCG (2000 UT) TABS Take 2,000 Units by mouth daily.     fluconazole (DIFLUCAN) 150 MG tablet Take 1 tablet (150 mg  total) by mouth daily. 1 tablet 0   HYDROmorphone (DILAUDID) 2 MG tablet Take 1 tablet (2 mg total) by mouth every 4 (four) hours as needed for severe pain. 60 tablet 0   lidocaine-prilocaine (EMLA) cream Apply 1 application topically as needed. Port access     ondansetron (ZOFRAN) 8 MG tablet TAKE 1 TABLET (8 MG TOTAL) BY MOUTH EVERY 8 (EIGHT) HOURS AS NEEDED FOR REFRACTORY NAUSEA / VOMITING. START ON DAY 3 AFTER CHEMO. (Patient taking differently: Take 8 mg by mouth every 8 (eight) hours as needed for nausea or vomiting. ) 30 tablet 1   polyethylene glycol (MIRALAX / GLYCOLAX) 17 g packet Take 17 g by mouth daily. 14 each 0   prochlorperazine (COMPAZINE) 10 MG tablet Take 1 tablet (10 mg total) by mouth every 6 (six) hours as needed (Nausea or vomiting). 30 tablet 1   senna-docusate (SENOKOT-S) 8.6-50 MG tablet Take 2 tablets by mouth 2 (two) times daily. 30 tablet 3   No current facility-administered medications for this visit.    PHYSICAL EXAMINATION: ECOG PERFORMANCE STATUS: 1 - Symptomatic but completely ambulatory  Vitals:   06/24/20 1056  BP: (!) 170/79  Pulse: 81  Resp: 18  Temp: 98.4 F (36.9 C)  SpO2: 97%   Filed Weights   06/24/20 1056  Weight: 156 lb 12.8  oz (71.1 kg)    GENERAL:alert, no distress and comfortable Musculoskeletal:no cyanosis of digits and no clubbing  NEURO: alert & oriented x 3 with fluent speech, no focal motor/sensory deficits  LABORATORY DATA:  I have reviewed the data as listed    Component Value Date/Time   NA 142 06/24/2020 1043   K 3.7 06/24/2020 1043   CL 108 06/24/2020 1043   CO2 25 06/24/2020 1043   GLUCOSE 100 (H) 06/24/2020 1043   BUN 12 06/24/2020 1043   CREATININE 0.70 06/24/2020 1043   CALCIUM 9.0 06/24/2020 1043   PROT 6.7 06/24/2020 1043   ALBUMIN 3.7 06/24/2020 1043   AST 20 06/24/2020 1043   ALT 17 06/24/2020 1043   ALKPHOS 54 06/24/2020 1043   BILITOT 0.3 06/24/2020 1043   GFRNONAA >60 06/24/2020 1043   GFRAA  >60 01/17/2020 1339    No results found for: SPEP, UPEP  Lab Results  Component Value Date   WBC 6.5 06/24/2020   NEUTROABS 2.9 06/24/2020   HGB 11.5 (L) 06/24/2020   HCT 35.0 (L) 06/24/2020   MCV 92.6 06/24/2020   PLT 144 (L) 06/24/2020      Chemistry      Component Value Date/Time   NA 142 06/24/2020 1043   K 3.7 06/24/2020 1043   CL 108 06/24/2020 1043   CO2 25 06/24/2020 1043   BUN 12 06/24/2020 1043   CREATININE 0.70 06/24/2020 1043      Component Value Date/Time   CALCIUM 9.0 06/24/2020 1043   ALKPHOS 54 06/24/2020 1043   AST 20 06/24/2020 1043   ALT 17 06/24/2020 1043   BILITOT 0.3 06/24/2020 1043       RADIOGRAPHIC STUDIES: I have personally reviewed the radiological images as listed and agreed with the findings in the report. CT CHEST ABDOMEN PELVIS W CONTRAST  Result Date: 06/10/2020 CLINICAL DATA:  Ovarian cancer.  Restaging. EXAM: CT CHEST, ABDOMEN, AND PELVIS WITH CONTRAST TECHNIQUE: Multidetector CT imaging of the chest, abdomen and pelvis was performed following the standard protocol during bolus administration of intravenous contrast. CONTRAST:  146m OMNIPAQUE IOHEXOL 300 MG/ML  SOLN COMPARISON:  [facility] CT abdomen/pelvis exam from 03/07/2020. Chest CT 11/08/2019. FINDINGS: CT CHEST FINDINGS Cardiovascular: The heart size is normal. No substantial pericardial effusion. Coronary artery calcification is evident. Right Port-A-Cath tip is positioned in the upper right atrium. Mediastinum/Nodes: No mediastinal lymphadenopathy. There is no hilar lymphadenopathy. The esophagus has normal imaging features. There is no axillary lymphadenopathy. Lungs/Pleura: Minimal biapical pleuroparenchymal scarring. No suspicious pulmonary nodule or mass. No focal airspace consolidation. No pleural effusion. Musculoskeletal: No worrisome lytic or sclerotic osseous abnormality. CT ABDOMEN PELVIS FINDINGS Hepatobiliary: No suspicious focal abnormality within the liver  parenchyma. There is no evidence for gallstones, gallbladder wall thickening, or pericholecystic fluid. No intrahepatic or extrahepatic biliary dilation. Pancreas: Diffuse parenchymal atrophy without main duct dilatation. Spleen: No splenomegaly. No focal mass lesion. Adrenals/Urinary Tract: No adrenal nodule or mass. Right kidney unremarkable. 19 mm exophytic cyst upper pole left kidney is stable since 03/07/2020. No evidence for hydroureter. The urinary bladder appears normal for the degree of distention. Stomach/Bowel: Stomach is unremarkable. No gastric wall thickening. No evidence of outlet obstruction. Duodenum is normally positioned as is the ligament of Treitz. No small bowel wall thickening. No small bowel dilatation. Diverticuli are seen scattered along the entire length of the colon without CT findings of diverticulitis. Vascular/Lymphatic: There is abdominal aortic atherosclerosis without aneurysm. There is no gastrohepatic or hepatoduodenal ligament lymphadenopathy. No  retroperitoneal or mesenteric lymphadenopathy. No pelvic sidewall lymphadenopathy. Reproductive: Uterus surgically absent.  There is no adnexal mass. Other: Soft tissue thickening along the vaginal cuff appears slightly decreased in the interval. Scarring in the central pelvis along the sigmoid colon is similar. Soft tissue component along the sigmoid colon measuring 3.0 x 1.4 cm today was 2.8 x 1.5 cm (remeasured) on previous CT. Small fluid collection seen in the anterior left pelvis adjacent to small bowel on the previous study has resolved. Musculoskeletal: Stable appearance of the midline small ventral hernia without complicating features. No worrisome lytic or sclerotic osseous abnormality. IMPRESSION: 1. Stable exam. No substantial change in the scattered areas of soft tissue thickening in the central pelvis. 2. Interval resolution of the small fluid collection seen in the anterior left pelvis adjacent to small bowel on the  previous study. 3. No evidence for new metastatic disease in the chest, abdomen, or pelvis. 4. Diffuse colonic diverticulosis without diverticulitis. 5. Aortic Atherosclerosis (ICD10-I70.0). Electronically Signed   By: Misty Stanley M.D.   On: 06/10/2020 13:28

## 2020-06-26 ENCOUNTER — Telehealth: Payer: Self-pay | Admitting: Oncology

## 2020-06-26 NOTE — Telephone Encounter (Signed)
Called Misty Hopkins back and advised her of message from Dr. Alvy Bimler.  She verbalized understanding and agreement.

## 2020-06-26 NOTE — Telephone Encounter (Signed)
Misty Hopkins called and asked for her CA 125 results from 06/24/20.  Advised her that it was not done this time.  She is wondering if she can come in and have it drawn next week.  She also said her [place] has ended due to a change in coding.  Advised her that I will have [name], Financial Adviser call her to discuss this.

## 2020-06-26 NOTE — Telephone Encounter (Signed)
I noticed the order ran out so I put new order in I do not recommend she comes in just to have it done; CT scan is more helpful and she just had one done recently Her next Rx is on 3/22, she will have it drawn then

## 2020-07-01 ENCOUNTER — Other Ambulatory Visit: Payer: Self-pay

## 2020-07-01 ENCOUNTER — Inpatient Hospital Stay (HOSPITAL_BASED_OUTPATIENT_CLINIC_OR_DEPARTMENT_OTHER): Payer: Medicare HMO | Admitting: Hematology and Oncology

## 2020-07-01 ENCOUNTER — Encounter: Payer: Self-pay | Admitting: Hematology and Oncology

## 2020-07-01 DIAGNOSIS — C5701 Malignant neoplasm of right fallopian tube: Secondary | ICD-10-CM

## 2020-07-01 DIAGNOSIS — R030 Elevated blood-pressure reading, without diagnosis of hypertension: Secondary | ICD-10-CM

## 2020-07-01 DIAGNOSIS — K5909 Other constipation: Secondary | ICD-10-CM | POA: Diagnosis not present

## 2020-07-01 MED ORDER — SENNOSIDES-DOCUSATE SODIUM 8.6-50 MG PO TABS: 2.0000 | ORAL_TABLET | Freq: Two times a day (BID) | ORAL | 3 refills | Status: DC

## 2020-07-01 NOTE — Assessment & Plan Note (Signed)
She will continue medications for constipation I refill her prescription for laxatives

## 2020-07-01 NOTE — Assessment & Plan Note (Signed)
She tolerated recent treatment well without major side effects except for mild fatigue Continue supportive care I will see her in 2 weeks as scheduled

## 2020-07-01 NOTE — Assessment & Plan Note (Signed)
Her blood pressure was elevated for 1 day but her last few blood pressure management were within normal limits I recommend the patient to continue checking her blood pressure twice a day

## 2020-07-01 NOTE — Progress Notes (Signed)
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   INTERVAL HISTORY: Please see below for problem oriented charting. The purpose of today's visit is to follow-up on symptom management and blood pressure management The patient was working in the yard this morning Her energy level is fair She is taking laxatives on a regular basis to avoid constipation She denies pain She has 1 documented blood pressure at 150 over 84 but subsequent blood pressure measurement were within normal limits She is not symptomatic from her treatment so far  SUMMARY OF ONCOLOGIC HISTORY: Oncology History Overview Note  High grade serous, right fallopian tube  HRD, BRCA tumor testing were neg   Ovarian cancer ([place])  03/21/2018 Initial Diagnosis   Ovarian cancer ([place])   02/04/2020 -  Chemotherapy   The patient had carboplatin and doxil for chemotherapy treatment.     Fallopian tube cancer, carcinoma, right ([place])  10/24/2017 Initial Diagnosis   She has presentation of vague abdominal pain   02/20/2018 Imaging   Outside CT abdomen and pelvis: This revealed an incidental finding of a 1.5 cm subcapsular lesion in  the lateral upper pole of the left kidney which measured higher than fluid attenuation which could represent a proteinaceous renal cyst or solid renal mass.  There was no ascites.  There is no lymphadenopathy.  There were no masses in the pelvis including ovarian or adnexal masses seen.  There was however soft tissue stranding seen with nodularity in the omental fat in the lower abdomen without evidence of ascites.  This was felt to represent either carcinomatosis or inflammatory infectious etiologies.  There was colonic diverticulosis seen without radiographic evidence of diverticulitis.  The radiologist recommended MRI of the abdomen to further work-up the renal mass   02/22/2018 Tumor Marker   Patient's tumor was tested for the following markers: CA-125. Results of the tumor marker test revealed 103.   03/06/2018 Pathology Results   Omentum, biopsy - ADENOCARCINOMA WITH PSAMMOMA BODIES, SEE COMMENT. Microscopic Comment There are scattered small foci of malignant glands with associated psammoma bodies. While the tumor is somewhat limited the cells have a more low grade appearance. There is prominent lymphovascular invasion. Immunohistochemistry reveals the cells are positive for cytokeratin 7, ER, MOC31, PAX8, and WT-1. They are negative for calretinin, cytokeratin 20, CDX-2, and TTF-1. Overall, the findings are consistent with adenocarcinoma. The differential includes a primary gynecologic or peritoneal tumor, although the morphology is not typical of a high grade serous carcinoma.   03/06/2018 Surgery   Surgeon: Donaciano Eva   Pre-operative Diagnosis: omental mass, elevated CA 125 Operation: Laparoscopic omentectomy  Surgeon: Donaciano Eva  Operative Findings:  : normal diaphragm and upper omentum. Sigmoid colon densely adherent to left pelvic side wall consistent  with history of diverticulitis. Omentum adherent to anterior abdominal wall and sigmoid colon. Surgically  absent uterus. Right ovary very small and grossly normal. Left ovary obscured by adhesed sigmoid colon. No ascites. No carcinomatosis. There was a nodular firm distal omentum on the left which was adherent to the sigmoid colon and most consistent with inflammatory change.      03/21/2018 Pathology Results   1. Soft tissue mass, simple excision, midline abdominal wall mass - METASTATIC SEROUS CARCINOMA. 2. Omentum, resection for tumor - METASTATIC SEROUS CARCINOMA. 3. Adnexa - ovary +/- tube, neoplastic, right - RIGHT FALLOPIAN TUBE: -HIGH GRADE SEROUS CARCINOMA. -SEE ONCOLOGY TABLE. - RIGHT OVARY: SURFACE PSAMMOMA BODIES. NO DEFINITIVE MALIGNANT CELLS. -INCLUSION CYSTS. 4. Adnexa - ovary +/- tube, neoplastic, left - LEFT FALLOPIAN TUBE: LUMINAL AND SURFACE SEROUS CARCINOMA. - LEFT OVARY: FOCAL PSAMMOMA BODIES AND MALIGNANT CELLS. 5. Soft tissue mass, simple excision, left abdominal wall - METASTATIC SEROUS CARCINOMA. Microscopic Comment 3. OVARY or FALLOPIAN TUBE or PRIMARY PERITONEUM: Procedure: Bilateral salpingo-oophorectomy. Abdominal wall mass excision and omentectomy. Specimen Integrity: Intact. Tumor Site: Right fallopian tube. Ovarian Surface Involvement (required only if applicable): Present (left). Fallopian Tube Surface Involvement (required only if applicable): Present. Tumor Size: 1.5 cm. Histologic Type: High grade serous carcinoma. Histologic Grade: High grade. Implants (required for advanced stage serous/seromucinous borderline tumors only): Present. Other Tissue/ Organ Involvement: Omentum, abdominal wall, left fallopian tube and ovary. Largest Extrapelvic Peritoneal Focus (required only if applicable): >2 cm. Peritoneal/Ascitic Fluid: N/A. Treatment Effect (required only for high-grade serous carcinomas): N/A. Regional Lymph Nodes: No lymph nodes submitted or found Pathologic Stage Classification (pTNM, AJCC 8th Edition): pT3c, pNX Representative Tumor Block:  3A Comment(s): None.   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   Surgeon: Thereasa Solo, MD. Specimens: Bilateral tubes / ovaries, omentum. Midline abdominal wall mass, left lateral abdominal wall mass.    Operative Findings:  Abdominal wall masses consistent with port site metastases at the umbilical incision (7cm) and left lateral incision (5cm). Omental caking in the infracolic omentum. Grossly normal very small tubes and ovaries. Normal diaphragms. No lymphadenopathy. Normal small and large intestine with the exception of miliary studding of tumor on the surface of the sigmoid colon and rectum in the cul de sac.    This represented an optimal cytoreduction (R1) with no gross visible disease remaining, however there was a thin rind of tumor on the lateral left fascia associated with the port site metastasis.     04/11/2018 Cancer Staging   Staging form: Ovary, Fallopian Tube, and Primary Peritoneal Carcinoma, AJCC 8th Edition - Pathologic: FIGO Stage IIIC (pT3, pN0, cM0) - Signed by [name], MD on 04/11/2018   05/23/2018 Imaging   1. Interval resolution of the anterior midline abdominal wall seroma. There is some persistent wispy soft tissue attenuation in the region of the midline incision, nonspecific and may simply reflect granulation tissue from surgery. 2. Stable 13 mm exophytic lesion posterior left kidney with attenuation higher than expected for a simple cyst. This may be a cyst complicated by proteinaceous debris or hemorrhage, but attention on follow-up recommended. 3. Stable mild persistent distention of proximal jejunal loops with gradual tapering to nondilated distal small bowel. 4. No overt peritoneal or mesenteric disease identified on this exam.    05/23/2018 Tumor Marker   Patient's tumor was tested for  the following markers: CA-125 Results of the tumor marker test revealed 11.6   06/02/2018 - 09/20/2018 Chemotherapy   The patient had carboplatin and taxol   06/19/2018 Procedure   Status post right IJ port catheter placement. Catheter ready for use.   06/26/2018 Tumor Marker   Patient's tumor was tested for the following markers: CA-125. Results of the tumor marker test revealed 10.7   09/20/2018 Tumor Marker   Patient's tumor was tested for the following markers: CA-125. Results of the tumor marker test revealed 9.1   10/25/2018 Imaging   1. There is suspicious, abnormal asymmetric increased soft tissue involving the cecum and ascending colon. This is suspicious for residual/progressive serosal involvement by tumor. 2. No additional sites of disease identified. No evidence for nodal metastasis, solid organ metastasis or ascites.     11/07/2018 Procedure   She had negative colonoscopy evaluation   12/05/2018 Tumor Marker   Patient's tumor was tested for the following markers: CA-125 Results of the tumor marker test revealed 8.6   01/25/2019 Imaging   Ct abdomen and pelvis Signs of omentectomy and hysterectomy without definitive signs of residual recurrent disease. Small nodular focus bridging rectum and vagina is stable dating back January to 12/14/2018, potentially postoperative but suggest attention on subsequent imaging.   01/25/2019 Tumor Marker   Patient's tumor was tested for the following markers: CA-125 Results of the tumor marker test revealed 10.3   03/15/2019 Tumor Marker   Patient's tumor was tested for the following markers: CA-125 Results of the tumor marker test revealed 9.6   03/23/2019 Imaging   No acute findings in the abdomen or pelvis.   Colonic diverticulosis.  No active diverticulitis.   Prior open tectum E and hysterectomy.   Aortic atherosclerosis.     05/10/2019 Tumor Marker   Patient's tumor  was tested for the following markers: CA-125 Results of the tumor marker test revealed 11.8   05/22/2019 Imaging   1. Stable exam. No evidence of recurrent or metastatic carcinoma within the abdomen or pelvis. 2. Colonic diverticulosis, without radiographic evidence of diverticulitis.     11/08/2019 Imaging   1. Status post hysterectomy and bilateral oophorectomy. 2. Soft tissue thickening within the deep pelvis which when compared to multiple prior exams is felt to be slowly progressive. Especially given the elevated CA 125 level, this is suspicious for peritoneal recurrence. Potential imaging strategies include PET (which may be of low sensitivity secondary to the lack of well-defined dominant mass) or pelvic CT follow-up at 3 months. 3. Otherwise, no evidence of metastatic disease within the chest, abdomen, or pelvis. 4. Coronary artery atherosclerosis. Aortic Atherosclerosis (ICD10-I70.0). 5. Ventral abdominal wall hernia containing nonobstructive transverse colon, as before. 6.  Possible constipation.   12/21/2019 Tumor Marker   Patient's tumor was tested for the following markers: 95.2. Results of the tumor marker test revealed CA-`125.   01/17/2020 Imaging   1. Similar appearance of nodularity in the central pelvis along the vaginal cuff. There is a particular nodular soft tissue density measuring 2.2 x 1.6 cm along the wall of the proximal rectum that is slightly more conspicuous than previously, raising concern for possible minimal progression. Similar appearance of the other areas of nodular soft tissue in the central pelvis. PET-CT may prove helpful to further evaluate. 2. No ascites. 3. Left colonic diverticulosis without diverticulitis. 4. 1.7 cm exophytic lesion upper pole left kidney with well-defined homogeneous attenuation slightly higher than would be expected for simple fluid. This is probably a complex  cyst. Attention on follow-up recommended. 5. Aortic Atherosclerosis  (ICD10-I70.0).   01/17/2020 Tumor Marker   Patient's tumor was tested for the following markers: CA-125 Results of the tumor marker test revealed 135   01/30/2020 Echocardiogram   1. Left ventricular ejection fraction, by estimation, is 55 to 60%. Left ventricular ejection fraction by PLAX is 55 %. The left ventricle has normal function. The left ventricle has no regional wall motion abnormalities. Left ventricular diastolic parameters are indeterminate.  2. Right ventricular systolic function is normal. The right ventricular size is normal.  3. Left atrial size was borderline dilated.  4. The mitral valve is grossly normal. Trivial mitral valve regurgitation.  5. The aortic valve was not well visualized. Aortic valve regurgitation is trivial.  6. The inferior vena cava is normal in size with greater than 50% respiratory variability, suggesting right atrial pressure of 3 mmHg.   02/04/2020 -  Chemotherapy   The patient had carboplatin and doxil for chemotherapy treatment.     03/03/2020 Tumor Marker   Patient's tumor was tested for the following markers: CA-125 Results of the tumor marker test revealed 272   03/07/2020 Procedure   1. Aspiration of left lower quadrant fluid collection yields 5 mL simple straw-colored serous fluid. 2. As the fluid appears to be simple in nature, no drainage catheter was placed. 3. The aspirated fluid was sent 4 body fluid culture to confirm its sterility.   03/07/2020 Imaging   VQ scan No perfusion defects evident. No findings indicative of pulmonary embolus.   03/07/2020 Imaging   Outside CT Multiple serosal implants again noted within the pelvis. However, increasing peritoneal nodularity and peritoneal thickening noted within the flanks and right upper quadrant as well as developing trace ascites within the mesentery all suspicious for progressive metastatic disease. Correlation with tumor markers may be helpful for further management.   Interval  development of a 3.2 cm fluid collection within the left lower quadrant. Given its relatively rapid development, and alternative etiology such as a diverticular abscess should be considered. Recurrent disease, particular given the patient's history of serous adenocarcinoma, is not definitively excluded   03/31/2020 Tumor Marker   Patient's tumor was tested for the following markers: CA-125. Results of the tumor marker test revealed 129   04/29/2020 Tumor Marker   Patient's tumor was tested for the following markers: CA-125 Results of the tumor marker test revealed 66.5   05/22/2020 Echocardiogram   1. GLS -19.9%.  2. Left ventricular ejection fraction, by estimation, is 55 to 60%. The left ventricle has normal function. The left ventricle has no regional wall motion abnormalities. Left ventricular diastolic parameters were normal.  3. Right ventricular systolic function is normal. The right ventricular size is normal.  4. The mitral valve is normal in structure. Trivial mitral valve regurgitation. No evidence of mitral stenosis.  5. The aortic valve is tricuspid. Aortic valve regurgitation is not visualized. No aortic stenosis is present.  6. The inferior vena cava is normal in size with greater than 50% respiratory variability, suggesting right atrial pressure of 3 mmHg.   05/27/2020 Tumor Marker   Patient's tumor was tested for the following markers: CA-125 Results of the tumor marker test revealed 61.2   06/10/2020 Imaging   1. Stable exam. No substantial change in the scattered areas of soft tissue thickening in the central pelvis. 2. Interval resolution of the small fluid collection seen in the anterior left pelvis adjacent to small bowel on the previous study. 3. No  evidence for new metastatic disease in the chest, abdomen, or pelvis. 4. Diffuse colonic diverticulosis without diverticulitis. 5. Aortic Atherosclerosis (ICD10-I70.0).     REVIEW OF SYSTEMS:   Constitutional: Denies  fevers, chills or abnormal weight loss Eyes: Denies blurriness of vision Ears, nose, mouth, throat, and face: Denies mucositis or sore throat Respiratory: Denies cough, dyspnea or wheezes Cardiovascular: Denies palpitation, chest discomfort Gastrointestinal:  Denies nausea, heartburn or change in bowel habits Skin: Denies abnormal skin rashes Lymphatics: Denies new lymphadenopathy or easy bruising Neurological:Denies numbness, tingling or new weaknesses Behavioral/Psych: Mood is stable, no new changes  Extremities: No lower extremity edema All other systems were reviewed with the patient and are negative.  I have reviewed the past medical history, past surgical history, social history and family history with the patient and they are unchanged from previous note.  ALLERGIES:  is allergic to ivp dye [iodinated diagnostic agents], propoxyphene, codeine, and ultram [tramadol hcl].  MEDICATIONS:  Current Outpatient Medications  Medication Sig Dispense Refill  . acetaminophen (TYLENOL) 325 MG tablet Take 650 mg by mouth every 6 (six) hours as needed for mild pain, fever or headache.    . Ascorbic Acid (VITAMIN C PO) Take 1 tablet by mouth daily.    . Biotin 1 MG CAPS Take 1 mg by mouth daily.     . carboxymethylcellulose (REFRESH PLUS) 0.5 % SOLN Place 1 drop into both eyes 3 (three) times daily as needed (for dry eyes.).    Marland Kitchen Cholecalciferol (VITAMIN D3 SUPER STRENGTH) 50 MCG (2000 UT) TABS Take 2,000 Units by mouth daily.    . fluconazole (DIFLUCAN) 150 MG tablet Take 1 tablet (150 mg total) by mouth daily. 1 tablet 0  . HYDROmorphone (DILAUDID) 2 MG tablet Take 1 tablet (2 mg total) by mouth every 4 (four) hours as needed for severe pain. 60 tablet 0  . lidocaine-prilocaine (EMLA) cream Apply 1 application topically as needed. Port access    . ondansetron (ZOFRAN) 8 MG tablet TAKE 1 TABLET (8 MG TOTAL) BY MOUTH EVERY 8 (EIGHT) HOURS AS NEEDED FOR REFRACTORY NAUSEA / VOMITING. START ON DAY 3  AFTER CHEMO. (Patient taking differently: Take 8 mg by mouth every 8 (eight) hours as needed for nausea or vomiting. ) 30 tablet 1  . polyethylene glycol (MIRALAX / GLYCOLAX) 17 g packet Take 17 g by mouth daily. 14 each 0  . prochlorperazine (COMPAZINE) 10 MG tablet Take 1 tablet (10 mg total) by mouth every 6 (six) hours as needed (Nausea or vomiting). 30 tablet 1  . senna-docusate (SENOKOT-S) 8.6-50 MG tablet Take 2 tablets by mouth 2 (two) times daily. 90 tablet 3   No current facility-administered medications for this visit.    PHYSICAL EXAMINATION: ECOG PERFORMANCE STATUS: 1 - Symptomatic but completely ambulatory  LABORATORY DATA:  I have reviewed the data as listed CMP Latest Ref Rng & Units 06/24/2020 05/27/2020 04/29/2020  Glucose 70 - 99 mg/dL 100(H) 121(H) 166(H)  BUN 8 - 23 mg/dL _0 Creatinine 0.44 - 1.00 mg/dL 0.70 0.75 0.78  Sodium 135 - 145 mmol/L 142 140 140  Potassium 3.5 - 5.1 mmol/L 3.7 3.7 3.5  Chloride 98 - 111 mmol/L 108 108 105  CO2 22 - 32 mmol/L _1 Calcium 8.9 - 10.3 mg/dL 9.0 9.1 9.2  Total Protein 6.5 - 8.1 g/dL 6.7 7.0 6.8  Total Bilirubin 0.3 - 1.2 mg/dL 0.3 0.3 0.4  Alkaline Phos 38 - 126 U/L 54 62 55  AST 15 - 41 U/L _0 ALT 0 - 44 U/L _1 Lab Results  Component Value Date   WBC 6.5 06/24/2020   HGB 11.5 (L) 06/24/2020   HCT 35.0 (L) 06/24/2020   MCV 92.6 06/24/2020   PLT 144 (L) 06/24/2020   NEUTROABS 2.9 06/24/2020     RADIOGRAPHIC STUDIES: I have personally reviewed the radiological images as listed and agreed with the findings in the report. CT CHEST ABDOMEN PELVIS W CONTRAST  Result Date: 06/10/2020 CLINICAL DATA:  Ovarian cancer.  Restaging. EXAM: CT CHEST, ABDOMEN, AND PELVIS WITH CONTRAST TECHNIQUE: Multidetector CT imaging of the chest, abdomen and pelvis was performed following the standard protocol during bolus administration of intravenous contrast. CONTRAST:  19m OMNIPAQUE IOHEXOL 300 MG/ML  SOLN  COMPARISON:  [facility] CT abdomen/pelvis exam from 03/07/2020. Chest CT 11/08/2019. FINDINGS: CT CHEST FINDINGS Cardiovascular: The heart size is normal. No substantial pericardial effusion. Coronary artery calcification is evident. Right Port-A-Cath tip is positioned in the upper right atrium. Mediastinum/Nodes: No mediastinal lymphadenopathy. There is no hilar lymphadenopathy. The esophagus has normal imaging features. There is no axillary lymphadenopathy. Lungs/Pleura: Minimal biapical pleuroparenchymal scarring. No suspicious pulmonary nodule or mass. No focal airspace consolidation. No pleural effusion. Musculoskeletal: No worrisome lytic or sclerotic osseous abnormality. CT ABDOMEN PELVIS FINDINGS Hepatobiliary: No suspicious focal abnormality within the liver parenchyma. There is no evidence for gallstones, gallbladder wall thickening, or pericholecystic fluid. No intrahepatic or extrahepatic biliary dilation. Pancreas: Diffuse parenchymal atrophy without main duct dilatation. Spleen: No splenomegaly. No focal mass lesion. Adrenals/Urinary Tract: No adrenal nodule or mass. Right kidney unremarkable. 19 mm exophytic cyst upper pole left kidney is stable since 03/07/2020. No evidence for hydroureter. The urinary bladder appears normal for the degree of distention. Stomach/Bowel: Stomach is unremarkable. No gastric wall thickening. No evidence of outlet obstruction. Duodenum is normally positioned as is the ligament of Treitz. No small bowel wall thickening. No small bowel dilatation. Diverticuli are seen scattered along the entire length of the colon without CT findings of diverticulitis. Vascular/Lymphatic: There is abdominal aortic atherosclerosis without aneurysm. There is no gastrohepatic or hepatoduodenal ligament lymphadenopathy. No retroperitoneal or mesenteric lymphadenopathy. No pelvic sidewall lymphadenopathy. Reproductive: Uterus surgically absent.  There is no adnexal mass. Other: Soft tissue  thickening along the vaginal cuff appears slightly decreased in the interval. Scarring in the central pelvis along the sigmoid colon is similar. Soft tissue component along the sigmoid colon measuring 3.0 x 1.4 cm today was 2.8 x 1.5 cm (remeasured) on previous CT. Small fluid collection seen in the anterior left pelvis adjacent to small bowel on the previous study has resolved. Musculoskeletal: Stable appearance of the midline small ventral hernia without complicating features. No worrisome lytic or sclerotic osseous abnormality. IMPRESSION: 1. Stable exam. No substantial change in the scattered areas of soft tissue thickening in the central pelvis. 2. Interval resolution of the small fluid collection seen in the anterior left pelvis adjacent to small bowel on the previous study. 3. No evidence for new metastatic disease in the chest, abdomen, or pelvis. 4. Diffuse colonic diverticulosis without diverticulitis. 5. Aortic Atherosclerosis (ICD10-I70.0). Electronically Signed   By: EMisty StanleyM.D.   On: 06/10/2020 13:28    I discussed the assessment and treatment plan with the patient. The patient was provided an opportunity to ask questions and all were answered. The patient agreed with the plan and demonstrated an understanding of the instructions. The patient was advised to call  back or seek an in-person evaluation if the symptoms worsen or if the condition fails to improve as anticipated.    I spent 20 minutes for the appointment reviewing test results, discuss management and coordination of care.  [name], MD 07/01/2020 12:30 PM

## 2020-07-04 ENCOUNTER — Telehealth: Payer: Self-pay | Admitting: Oncology

## 2020-07-04 NOTE — Telephone Encounter (Signed)
Misty Hopkins called and said she has noticed puffiness in her face, especially her cheeks and around her eyes for the past couple of days.  She said it's more on her left side.  Her head also feels tight and stuffy in the mornings.  She is not sure if this is from seasonal allergies and has been taking benadryl as needed.  She denies having a fever or cough.  She wanted to let Dr. Alvy Bimler know.

## 2020-07-04 NOTE — Telephone Encounter (Signed)
Misty Hopkins with message below from Dr. Alvy Bimler.  She verbalized agreement and understanding of instructions.

## 2020-07-04 NOTE — Telephone Encounter (Signed)
I recommend OTC allergy medicines Continue to check her BP twice daily and bring her readings next visit or call if it is high (SBP >150, DBP>95)

## 2020-07-07 ENCOUNTER — Telehealth: Payer: Self-pay | Admitting: Oncology

## 2020-07-07 NOTE — Telephone Encounter (Signed)
OK to proceed

## 2020-07-07 NOTE — Telephone Encounter (Signed)
Called Misty Hopkins and advised her of message below from Dr. Alvy Bimler.

## 2020-07-07 NOTE — Telephone Encounter (Signed)
Misty Hopkins left a message saying that she is scheduled for a mammogram today.  She is wondering if this is ok/safe since she just had CT imaging last month.

## 2020-07-14 ENCOUNTER — Telehealth: Payer: Self-pay | Admitting: Oncology

## 2020-07-14 ENCOUNTER — Other Ambulatory Visit: Payer: Self-pay | Admitting: Hematology and Oncology

## 2020-07-14 DIAGNOSIS — R300 Dysuria: Secondary | ICD-10-CM

## 2020-07-14 NOTE — Telephone Encounter (Signed)
I will add stat UA and Cx tomorrow

## 2020-07-14 NOTE — Telephone Encounter (Signed)
Misty Hopkins called and said she thinks she is starting another bladder infection.  She said it started last night and feels the same as last time.  Her urethra feels irritated and she is having a little burning with urination.  Her urine is also a dark color.  She is wondering if she can have a UA culture with her labs tomorrow.  Asked if she would like to come for labs today and she would like to wait until tomorrow. She is going to drink lots of fluids today and will call if it gets worse.

## 2020-07-15 ENCOUNTER — Inpatient Hospital Stay: Payer: Medicare HMO

## 2020-07-15 ENCOUNTER — Other Ambulatory Visit: Payer: Self-pay

## 2020-07-15 ENCOUNTER — Encounter: Payer: Self-pay | Admitting: Hematology and Oncology

## 2020-07-15 ENCOUNTER — Inpatient Hospital Stay (HOSPITAL_BASED_OUTPATIENT_CLINIC_OR_DEPARTMENT_OTHER): Payer: Medicare HMO | Admitting: Hematology and Oncology

## 2020-07-15 DIAGNOSIS — C5701 Malignant neoplasm of right fallopian tube: Secondary | ICD-10-CM | POA: Diagnosis not present

## 2020-07-15 DIAGNOSIS — R300 Dysuria: Secondary | ICD-10-CM | POA: Diagnosis not present

## 2020-07-15 DIAGNOSIS — C569 Malignant neoplasm of unspecified ovary: Secondary | ICD-10-CM

## 2020-07-15 DIAGNOSIS — R030 Elevated blood-pressure reading, without diagnosis of hypertension: Secondary | ICD-10-CM | POA: Diagnosis not present

## 2020-07-15 DIAGNOSIS — Z5112 Encounter for antineoplastic immunotherapy: Secondary | ICD-10-CM | POA: Diagnosis not present

## 2020-07-15 DIAGNOSIS — Z7189 Other specified counseling: Secondary | ICD-10-CM

## 2020-07-15 LAB — CMP (CANCER CENTER ONLY)
ALT: 18 U/L (ref 0–44)
AST: 19 U/L (ref 15–41)
Albumin: 3.9 g/dL (ref 3.5–5.0)
Alkaline Phosphatase: 54 U/L (ref 38–126)
Anion gap: 12 (ref 5–15)
BUN: 17 mg/dL (ref 8–23)
CO2: 24 mmol/L (ref 22–32)
Calcium: 9.2 mg/dL (ref 8.9–10.3)
Chloride: 106 mmol/L (ref 98–111)
Creatinine: 0.78 mg/dL (ref 0.44–1.00)
GFR, Estimated: >60 mL/min (ref >60)
Glucose, Bld: 98 mg/dL (ref 70–99)
Potassium: 4.2 mmol/L (ref 3.5–5.1)
Sodium: 142 mmol/L (ref 135–145)
Total Bilirubin: 0.5 mg/dL (ref 0.3–1.2)
Total Protein: 7.1 g/dL (ref 6.5–8.1)

## 2020-07-15 LAB — CBC WITH DIFFERENTIAL (CANCER CENTER ONLY)
Abs Immature Granulocytes: 0.02 10*3/uL (ref 0.00–0.07)
Basophils Absolute: 0 10*3/uL (ref 0.0–0.1)
Basophils Relative: 1 %
Eosinophils Absolute: 0.1 10*3/uL (ref 0.0–0.5)
Eosinophils Relative: 1 %
HCT: 37.7 % (ref 36.0–46.0)
Hemoglobin: 12.2 g/dL (ref 12.0–15.0)
Immature Granulocytes: 0 %
Lymphocytes Relative: 37 %
Lymphs Abs: 2 10*3/uL (ref 0.7–4.0)
MCH: 30.6 pg (ref 26.0–34.0)
MCHC: 32.4 g/dL (ref 30.0–36.0)
MCV: 94.5 fL (ref 80.0–100.0)
Monocytes Absolute: 0.9 10*3/uL (ref 0.1–1.0)
Monocytes Relative: 16 %
Neutro Abs: 2.5 10*3/uL (ref 1.7–7.7)
Neutrophils Relative %: 45 %
Platelet Count: 169 10*3/uL (ref 150–400)
RBC: 3.99 MIL/uL (ref 3.87–5.11)
RDW: 15.1 % (ref 11.5–15.5)
WBC Count: 5.6 10*3/uL (ref 4.0–10.5)
nRBC: 0 % (ref 0.0–0.2)

## 2020-07-15 LAB — URINALYSIS, COMPLETE (UACMP) WITH MICROSCOPIC
Bacteria, UA: NONE SEEN
Bilirubin Urine: NEGATIVE
Glucose, UA: NEGATIVE mg/dL
Hgb urine dipstick: NEGATIVE
Ketones, ur: 5 mg/dL — AB
Nitrite: NEGATIVE
Protein, ur: NEGATIVE mg/dL
Specific Gravity, Urine: 1.018 (ref 1.005–1.030)
pH: 5 (ref 5.0–8.0)

## 2020-07-15 MED ORDER — SODIUM CHLORIDE 0.9% FLUSH
10.0000 mL | INTRAVENOUS | Status: DC | PRN
  Administered 2020-07-15: 10 mL
  Filled 2020-07-15: qty 10

## 2020-07-15 MED ORDER — SODIUM CHLORIDE 0.9 % IV SOLN
Freq: Once | INTRAVENOUS | Status: AC
  Filled 2020-07-15: qty 250

## 2020-07-15 MED ORDER — SODIUM CHLORIDE 0.9 % IV SOLN
15.0000 mg/kg | Freq: Once | INTRAVENOUS | Status: AC
  Administered 2020-07-15: 1100 mg via INTRAVENOUS
  Filled 2020-07-15: qty 32

## 2020-07-15 MED ORDER — ALTEPLASE 2 MG IJ SOLR
INTRAMUSCULAR | Status: AC
  Filled 2020-07-15: qty 2

## 2020-07-15 MED ORDER — ALTEPLASE 2 MG IJ SOLR
2.0000 mg | Freq: Once | INTRAMUSCULAR | Status: AC
  Administered 2020-07-15: 2 mg
  Filled 2020-07-15: qty 2

## 2020-07-15 MED ORDER — HEPARIN SOD (PORK) LOCK FLUSH 100 UNIT/ML IV SOLN
500.0000 [IU] | Freq: Once | INTRAVENOUS | Status: AC | PRN
Start: 2020-07-15 — End: 2020-07-15
  Administered 2020-07-15: 500 [IU]
  Filled 2020-07-15: qty 5

## 2020-07-15 NOTE — Patient Instructions (Signed)
[facility] Discharge Instructions for Patients Receiving Chemotherapy  Today you received the following chemotherapy agents: MVASI  To help prevent nausea and vomiting after your treatment, we encourage you to take your nausea medication as directed.Bevacizumab injection What is this medicine? BEVACIZUMAB (be va SIZ yoo mab) is a monoclonal antibody. It is used to treat many types of cancer. This medicine may be used for other purposes; ask your health care provider or pharmacist if you have questions. COMMON BRAND NAME(S): Avastin, MVASI, Zirabev What should I tell my health care provider before I take this medicine? They need to know if you have any of these conditions:  diabetes  heart disease  high blood pressure  history of coughing up blood  prior anthracycline chemotherapy (e.g., doxorubicin, daunorubicin, epirubicin)  recent or ongoing radiation therapy  recent or planning to have surgery  stroke  an unusual or allergic reaction to bevacizumab, hamster proteins, mouse proteins, other medicines, foods, dyes, or preservatives  pregnant or trying to get pregnant  breast-feeding How should I use this medicine? This medicine is for infusion into a vein. It is given by a health care professional in a hospital or clinic setting. Talk to your pediatrician regarding the use of this medicine in children. Special care may be needed. Overdosage: If you think you have taken too much of this medicine contact a poison control center or emergency room at once. NOTE: This medicine is only for you. Do not share this medicine with others. What if I miss a dose? It is important not to miss your dose. Call your doctor or health care professional if you are unable to keep an appointment. What may interact with this medicine? Interactions are not expected. This list may not describe all possible interactions. Give your health care provider a list of all the medicines,  herbs, non-prescription drugs, or dietary supplements you use. Also tell them if you smoke, drink alcohol, or use illegal drugs. Some items may interact with your medicine. What should I watch for while using this medicine? Your condition will be monitored carefully while you are receiving this medicine. You will need important blood work and urine testing done while you are taking this medicine. This medicine may increase your risk to bruise or bleed. Call your doctor or health care professional if you notice any unusual bleeding. Before having surgery, talk to your health care provider to make sure it is ok. This drug can increase the risk of poor healing of your surgical site or wound. You will need to stop this drug for 28 days before surgery. After surgery, wait at least 28 days before restarting this drug. Make sure the surgical site or wound is healed enough before restarting this drug. Talk to your health care provider if questions. Do not become pregnant while taking this medicine or for 6 months after stopping it. Women should inform their doctor if they wish to become pregnant or think they might be pregnant. There is a potential for serious side effects to an unborn child. Talk to your health care professional or pharmacist for more information. Do not breast-feed an infant while taking this medicine and for 6 months after the last dose. This medicine has caused ovarian failure in some women. This medicine may interfere with the ability to have a child. You should talk to your doctor or health care professional if you are concerned about your fertility. What side effects may I notice from receiving this medicine? Side effects  that you should report to your doctor or health care professional as soon as possible:  allergic reactions like skin rash, itching or hives, swelling of the face, lips, or tongue  chest pain or chest tightness  chills  coughing up blood  high  fever  seizures  severe constipation  signs and symptoms of bleeding such as bloody or black, tarry stools; red or dark-brown urine; spitting up blood or brown material that looks like coffee grounds; red spots on the skin; unusual bruising or bleeding from the eye, gums, or nose  signs and symptoms of a blood clot such as breathing problems; chest pain; severe, sudden headache; pain, swelling, warmth in the leg  signs and symptoms of a stroke like changes in vision; confusion; trouble speaking or understanding; severe headaches; sudden numbness or weakness of the face, arm or leg; trouble walking; dizziness; loss of balance or coordination  stomach pain  sweating  swelling of legs or ankles  vomiting  weight gain Side effects that usually do not require medical attention (report to your doctor or health care professional if they continue or are bothersome):  back pain  changes in taste  decreased appetite  dry skin  nausea  tiredness This list may not describe all possible side effects. Call your doctor for medical advice about side effects. You may report side effects to FDA at 1-800-FDA-1088. Where should I keep my medicine? This drug is given in a hospital or clinic and will not be stored at home. NOTE: This sheet is a summary. It may not cover all possible information. If you have questions about this medicine, talk to your doctor, pharmacist, or health care provider.  2021 [employer]Gold Standard (2019-02-07 10:50:46)    If you develop nausea and vomiting that is not controlled by your nausea medication, call the clinic.   BELOW ARE SYMPTOMS THAT SHOULD BE REPORTED IMMEDIATELY:  *FEVER GREATER THAN 100.5 F  *CHILLS WITH OR WITHOUT FEVER  NAUSEA AND VOMITING THAT IS NOT CONTROLLED WITH YOUR NAUSEA MEDICATION  *UNUSUAL SHORTNESS OF BREATH  *UNUSUAL BRUISING OR BLEEDING  TENDERNESS IN MOUTH AND THROAT WITH OR WITHOUT PRESENCE OF ULCERS  *URINARY  PROBLEMS  *BOWEL PROBLEMS  UNUSUAL RASH Items with * indicate a potential emergency and should be followed up as soon as possible.  Feel free to call the clinic should you have any questions or concerns. The clinic phone number is [phone].  Please show the [place] at check-in to the Emergency Department and triage nurse.

## 2020-07-15 NOTE — Assessment & Plan Note (Signed)
She is finishing a course of antibiotics treatment complicated by yeast infection I suspect her urinary symptoms is due to the yeast infection We will call her once culture results are available She has taken fluconazole today

## 2020-07-15 NOTE — Assessment & Plan Note (Signed)
She is recovering well from side effects of her recent chemo She will proceed with bevacizumab only She is instructed to continue close monitoring of her blood pressure I will see her again in 3 weeks for further follow-up

## 2020-07-15 NOTE — Progress Notes (Signed)
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[facility]) She is recovering well from side effects of her recent chemo She will proceed with bevacizumab only She is instructed to continue close monitoring of her blood pressure I will see her again in 3 weeks for further follow-up  Dysuria She is finishing a course of antibiotics treatment complicated by yeast infection I suspect her urinary symptoms is due to the yeast infection We will call her once culture results are available She has taken fluconazole today  Elevated BP without diagnosis of hypertension Her blood pressure is borderline high I recommend she continues on amlodipine She will continue to check her blood pressure twice a day and I will review her blood pressure control in her next visit   No orders of the defined types were placed in this encounter.   All questions were answered. The patient knows to call the clinic with any problems, questions or concerns. The total time spent in the appointment was 20 minutes encounter with patients including review of chart and various tests results, discussions about plan of care and coordination of care plan   [name], MD 07/15/2020 1:28 PM  INTERVAL HISTORY: Please see below for problem oriented charting. She returns for treatment and follow-up She had recent sinus infection, improving on Augmentin Starting yesterday, she started to have dysuria She took fluconazole today due to yeast infection Her blood pressure is borderline high Majority of systolic blood pressure between 130-140 but she has occasional 150 She was started on amlodipine Apart from her suprapubic discomfort, she denies nausea or changes in bowel habits  SUMMARY OF ONCOLOGIC HISTORY: Oncology History Overview  Note  High grade serous, right fallopian tube  HRD, BRCA tumor testing were neg   Ovarian cancer ([place])  03/21/2018 Initial Diagnosis   Ovarian cancer ([place])   02/04/2020 -  Chemotherapy   The patient had carboplatin and doxil for chemotherapy treatment.     Fallopian tube cancer, carcinoma, right ([place])  10/24/2017 Initial Diagnosis   She has presentation of vague abdominal pain   02/20/2018 Imaging   Outside CT abdomen and pelvis: This revealed an incidental finding of a 1.5 cm subcapsular lesion in the lateral upper pole of the left kidney which measured higher than fluid attenuation which could represent a proteinaceous renal cyst or solid renal mass.  There was no ascites.  There is no lymphadenopathy.  There were no masses in the pelvis including ovarian or adnexal masses seen.  There was however soft tissue stranding seen with nodularity in the omental fat in the lower abdomen without evidence of ascites.  This was felt to represent either carcinomatosis or inflammatory infectious etiologies.  There was colonic diverticulosis seen without radiographic evidence of diverticulitis.  The radiologist recommended MRI of the abdomen to further work-up the renal mass   02/22/2018 Tumor Marker   Patient's tumor was tested for the following markers: CA-125. Results of the tumor marker test revealed 103.   03/06/2018 Pathology Results   Omentum, biopsy - ADENOCARCINOMA WITH PSAMMOMA BODIES, SEE COMMENT. Microscopic Comment There are scattered small foci of malignant glands with associated psammoma bodies. While the tumor is somewhat limited the cells have a more low grade appearance. There is prominent lymphovascular invasion. Immunohistochemistry reveals the cells are positive for cytokeratin 7, ER, MOC31, PAX8,  and WT-1. They are negative for calretinin, cytokeratin 20, CDX-2, and TTF-1. Overall, the findings are consistent with adenocarcinoma. The differential includes a primary gynecologic or  peritoneal tumor, although the morphology is not typical of a high grade serous carcinoma.   03/06/2018 Surgery   Surgeon: Donaciano Eva   Pre-operative Diagnosis: omental mass, elevated CA 125 Operation: Laparoscopic omentectomy  Surgeon: Donaciano Eva  Operative Findings:  : normal diaphragm and upper omentum. Sigmoid colon densely adherent to left pelvic side wall consistent with history of diverticulitis. Omentum adherent to anterior abdominal wall and sigmoid colon. Surgically absent uterus. Right ovary very small and grossly normal. Left ovary obscured by adhesed sigmoid colon. No ascites. No carcinomatosis. There was a nodular firm distal omentum on the left which was adherent to the sigmoid colon and most consistent with inflammatory change.      03/21/2018 Pathology Results   1. Soft tissue mass, simple excision, midline abdominal wall mass - METASTATIC SEROUS CARCINOMA. 2. Omentum, resection for tumor - METASTATIC SEROUS CARCINOMA. 3. Adnexa - ovary +/- tube, neoplastic, right - RIGHT FALLOPIAN TUBE: -HIGH GRADE SEROUS CARCINOMA. -SEE ONCOLOGY TABLE. - RIGHT OVARY: SURFACE PSAMMOMA BODIES. NO DEFINITIVE MALIGNANT CELLS. -INCLUSION CYSTS. 4. Adnexa - ovary +/- tube, neoplastic, left - LEFT FALLOPIAN TUBE: LUMINAL AND SURFACE SEROUS CARCINOMA. - LEFT OVARY: FOCAL PSAMMOMA BODIES AND MALIGNANT CELLS. 5. Soft tissue mass, simple excision, left abdominal wall - METASTATIC SEROUS CARCINOMA. Microscopic Comment 3. OVARY or FALLOPIAN TUBE or PRIMARY PERITONEUM: Procedure: Bilateral salpingo-oophorectomy. Abdominal wall mass excision and omentectomy. Specimen Integrity: Intact. Tumor Site: Right fallopian tube. Ovarian Surface Involvement (required only if applicable): Present (left). Fallopian Tube Surface Involvement (required only if applicable): Present. Tumor Size: 1.5 cm. Histologic Type: High grade serous carcinoma. Histologic Grade: High  grade. Implants (required for advanced stage serous/seromucinous borderline tumors only): Present. Other Tissue/ Organ Involvement: Omentum, abdominal wall, left fallopian tube and ovary. Largest Extrapelvic Peritoneal Focus (required only if applicable): >2 cm. Peritoneal/Ascitic Fluid: N/A. Treatment Effect (required only for high-grade serous carcinomas): N/A. Regional Lymph Nodes: No lymph nodes submitted or found Pathologic Stage Classification (pTNM, AJCC 8th Edition): pT3c, pNX Representative Tumor Block: 3A Comment(s): None.   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   Surgeon: Thereasa Solo, MD. Specimens: Bilateral tubes / ovaries, omentum. Midline abdominal wall mass, left lateral abdominal wall mass.    Operative Findings:  Abdominal wall masses consistent with port site metastases at the umbilical incision (7cm) and left lateral incision (5cm). Omental caking in the infracolic omentum. Grossly normal very small tubes and ovaries. Normal diaphragms. No lymphadenopathy. Normal small and large intestine with the exception of miliary studding of tumor on the surface of the sigmoid colon and rectum in the cul de sac.    This represented an optimal cytoreduction (R1) with no gross visible disease remaining, however there was a thin rind of tumor on the lateral left fascia associated with the port site metastasis.     04/11/2018 Cancer Staging   Staging form: Ovary, Fallopian Tube, and Primary Peritoneal Carcinoma, AJCC 8th Edition - Pathologic: FIGO Stage IIIC (pT3, pN0, cM0) - Signed by [name], MD on 04/11/2018   05/23/2018 Imaging   1. Interval resolution of the anterior midline abdominal wall seroma. There is some persistent wispy soft tissue  attenuation in the region  of the midline incision, nonspecific and may simply reflect granulation tissue from surgery. 2. Stable 13 mm exophytic lesion posterior left kidney with attenuation higher than expected for a simple cyst. This may be a cyst complicated by proteinaceous debris or hemorrhage, but attention on follow-up recommended. 3. Stable mild persistent distention of proximal jejunal loops with gradual tapering to nondilated distal small bowel. 4. No overt peritoneal or mesenteric disease identified on this exam.   05/23/2018 Tumor Marker   Patient's tumor was tested for the following markers: CA-125 Results of the tumor marker test revealed 11.6   06/02/2018 - 09/20/2018 Chemotherapy   The patient had carboplatin and taxol   06/19/2018 Procedure   Status post right IJ port catheter placement. Catheter ready for use.   06/26/2018 Tumor Marker   Patient's tumor was tested for the following markers: CA-125. Results of the tumor marker test revealed 10.7   09/20/2018 Tumor Marker   Patient's tumor was tested for the following markers: CA-125. Results of the tumor marker test revealed 9.1   10/25/2018 Imaging   1. There is suspicious, abnormal asymmetric increased soft tissue involving the cecum and ascending colon. This is suspicious for residual/progressive serosal involvement by tumor. 2. No additional sites of disease identified. No evidence for nodal metastasis, solid organ metastasis or ascites.     11/07/2018 Procedure   She had negative colonoscopy evaluation   12/05/2018 Tumor Marker   Patient's tumor was tested for the following markers: CA-125 Results of the tumor marker test revealed 8.6   01/25/2019 Imaging   Ct abdomen and pelvis Signs of omentectomy and hysterectomy without definitive signs of residual recurrent disease. Small nodular focus bridging rectum and vagina is stable dating back January to 12/14/2018, potentially postoperative but suggest attention on  subsequent imaging.   01/25/2019 Tumor Marker   Patient's tumor was tested for the following markers: CA-125 Results of the tumor marker test revealed 10.3   03/15/2019 Tumor Marker   Patient's tumor was tested for the following markers: CA-125 Results of the tumor marker test revealed 9.6   03/23/2019 Imaging   No acute findings in the abdomen or pelvis.   Colonic diverticulosis.  No active diverticulitis.   Prior open tectum E and hysterectomy.   Aortic atherosclerosis.     05/10/2019 Tumor Marker   Patient's tumor was tested for the following markers: CA-125 Results of the tumor marker test revealed 11.8   05/22/2019 Imaging   1. Stable exam. No evidence of recurrent or metastatic carcinoma within the abdomen or pelvis. 2. Colonic diverticulosis, without radiographic evidence of diverticulitis.     11/08/2019 Imaging   1. Status post hysterectomy and bilateral oophorectomy. 2. Soft tissue thickening within the deep pelvis which when compared to multiple prior exams is felt to be slowly progressive. Especially given the elevated CA 125 level, this is suspicious for peritoneal recurrence. Potential imaging strategies include PET (which may be of low sensitivity secondary to the lack of well-defined dominant mass) or pelvic CT follow-up at 3 months. 3. Otherwise, no evidence of metastatic disease within the chest, abdomen, or pelvis. 4. Coronary artery atherosclerosis. Aortic Atherosclerosis (ICD10-I70.0). 5. Ventral abdominal wall hernia containing nonobstructive transverse colon, as before. 6.  Possible constipation.   12/21/2019 Tumor Marker   Patient's tumor was tested for the following markers: 95.2. Results of the tumor marker test revealed CA-`125.   01/17/2020 Imaging   1. Similar appearance of nodularity in the central pelvis along the vaginal cuff. There is a  particular nodular soft tissue density measuring 2.2 x 1.6 cm along the wall of the proximal rectum that is  slightly more conspicuous than previously, raising concern for possible minimal progression. Similar appearance of the other areas of nodular soft tissue in the central pelvis. PET-CT may prove helpful to further evaluate. 2. No ascites. 3. Left colonic diverticulosis without diverticulitis. 4. 1.7 cm exophytic lesion upper pole left kidney with well-defined homogeneous attenuation slightly higher than would be expected for simple fluid. This is probably a complex cyst. Attention on follow-up recommended. 5. Aortic Atherosclerosis (ICD10-I70.0).   01/17/2020 Tumor Marker   Patient's tumor was tested for the following markers: CA-125 Results of the tumor marker test revealed 135   01/30/2020 Echocardiogram   1. Left ventricular ejection fraction, by estimation, is 55 to 60%. Left ventricular ejection fraction by PLAX is 55 %. The left ventricle has normal function. The left ventricle has no regional wall motion abnormalities. Left ventricular diastolic parameters are indeterminate.  2. Right ventricular systolic function is normal. The right ventricular size is normal.  3. Left atrial size was borderline dilated.  4. The mitral valve is grossly normal. Trivial mitral valve regurgitation.  5. The aortic valve was not well visualized. Aortic valve regurgitation is trivial.  6. The inferior vena cava is normal in size with greater than 50% respiratory variability, suggesting right atrial pressure of 3 mmHg.   02/04/2020 -  Chemotherapy   The patient had carboplatin and doxil for chemotherapy treatment.     03/03/2020 Tumor Marker   Patient's tumor was tested for the following markers: CA-125 Results of the tumor marker test revealed 272   03/07/2020 Procedure   1. Aspiration of left lower quadrant fluid collection yields 5 mL simple straw-colored serous fluid. 2. As the fluid appears to be simple in nature, no drainage catheter was placed. 3. The aspirated fluid was sent 4 body fluid culture to  confirm its sterility.   03/07/2020 Imaging   VQ scan No perfusion defects evident. No findings indicative of pulmonary embolus.   03/07/2020 Imaging   Outside CT Multiple serosal implants again noted within the pelvis. However, increasing peritoneal nodularity and peritoneal thickening noted within the flanks and right upper quadrant as well as developing trace ascites within the mesentery all suspicious for progressive metastatic disease. Correlation with tumor markers may be helpful for further management.   Interval development of a 3.2 cm fluid collection within the left lower quadrant. Given its relatively rapid development, and alternative etiology such as a diverticular abscess should be considered. Recurrent disease, particular given the patient's history of serous adenocarcinoma, is not definitively excluded   03/31/2020 Tumor Marker   Patient's tumor was tested for the following markers: CA-125. Results of the tumor marker test revealed 129   04/29/2020 Tumor Marker   Patient's tumor was tested for the following markers: CA-125 Results of the tumor marker test revealed 66.5   05/22/2020 Echocardiogram   1. GLS -19.9%.  2. Left ventricular ejection fraction, by estimation, is 55 to 60%. The left ventricle has normal function. The left ventricle has no regional wall motion abnormalities. Left ventricular diastolic parameters were normal.  3. Right ventricular systolic function is normal. The right ventricular size is normal.  4. The mitral valve is normal in structure. Trivial mitral valve regurgitation. No evidence of mitral stenosis.  5. The aortic valve is tricuspid. Aortic valve regurgitation is not visualized. No aortic stenosis is present.  6. The inferior vena cava is normal  in size with greater than 50% respiratory variability, suggesting right atrial pressure of 3 mmHg.   05/27/2020 Tumor Marker   Patient's tumor was tested for the following markers: CA-125 Results of the  tumor marker test revealed 61.2   06/10/2020 Imaging   1. Stable exam. No substantial change in the scattered areas of soft tissue thickening in the central pelvis. 2. Interval resolution of the small fluid collection seen in the anterior left pelvis adjacent to small bowel on the previous study. 3. No evidence for new metastatic disease in the chest, abdomen, or pelvis. 4. Diffuse colonic diverticulosis without diverticulitis. 5. Aortic Atherosclerosis (ICD10-I70.0).     REVIEW OF SYSTEMS:   Constitutional: Denies fevers, chills or abnormal weight loss Eyes: Denies blurriness of vision Ears, nose, mouth, throat, and face: Denies mucositis or sore throat Respiratory: Denies cough, dyspnea or wheezes Cardiovascular: Denies palpitation, chest discomfort or lower extremity swelling Gastrointestinal:  Denies nausea, heartburn or change in bowel habits Skin: Denies abnormal skin rashes Lymphatics: Denies new lymphadenopathy or easy bruising Neurological:Denies numbness, tingling or new weaknesses Behavioral/Psych: Mood is stable, no new changes  All other systems were reviewed with the patient and are negative.  I have reviewed the past medical history, past surgical history, social history and family history with the patient and they are unchanged from previous note.  ALLERGIES:  is allergic to ivp dye [iodinated diagnostic agents], propoxyphene, codeine, and ultram [tramadol hcl].  MEDICATIONS:  Current Outpatient Medications  Medication Sig Dispense Refill  . amLODipine (NORVASC) 5 MG tablet Take 5 mg by mouth daily.    Marland Kitchen amoxicillin-clavulanate (AUGMENTIN) 875-125 MG tablet Take 1 tablet by mouth 2 (two) times daily.    Marland Kitchen acetaminophen (TYLENOL) 325 MG tablet Take 650 mg by mouth every 6 (six) hours as needed for mild pain, fever or headache.    . Ascorbic Acid (VITAMIN C PO) Take 1 tablet by mouth daily.    . Biotin 1 MG CAPS Take 1 mg by mouth daily.     . carboxymethylcellulose  (REFRESH PLUS) 0.5 % SOLN Place 1 drop into both eyes 3 (three) times daily as needed (for dry eyes.).    Marland Kitchen Cholecalciferol (VITAMIN D3 SUPER STRENGTH) 50 MCG (2000 UT) TABS Take 2,000 Units by mouth daily.    . fluconazole (DIFLUCAN) 150 MG tablet Take 1 tablet (150 mg total) by mouth daily. 1 tablet 0  . HYDROmorphone (DILAUDID) 2 MG tablet Take 1 tablet (2 mg total) by mouth every 4 (four) hours as needed for severe pain. 60 tablet 0  . lidocaine-prilocaine (EMLA) cream Apply 1 application topically as needed. Port access    . ondansetron (ZOFRAN) 8 MG tablet TAKE 1 TABLET (8 MG TOTAL) BY MOUTH EVERY 8 (EIGHT) HOURS AS NEEDED FOR REFRACTORY NAUSEA / VOMITING. START ON DAY 3 AFTER CHEMO. (Patient taking differently: Take 8 mg by mouth every 8 (eight) hours as needed for nausea or vomiting. ) 30 tablet 1  . polyethylene glycol (MIRALAX / GLYCOLAX) 17 g packet Take 17 g by mouth daily. 14 each 0  . prochlorperazine (COMPAZINE) 10 MG tablet Take 1 tablet (10 mg total) by mouth every 6 (six) hours as needed (Nausea or vomiting). 30 tablet 1  . senna-docusate (SENOKOT-S) 8.6-50 MG tablet Take 2 tablets by mouth 2 (two) times daily. 90 tablet 3   No current facility-administered medications for this visit.   Facility-Administered Medications Ordered in Other Visits  Medication Dose Route Frequency Provider Last Rate Last Admin  .  0.9 %  sodium chloride infusion   Intravenous Once Alvy Bimler, Anushri Casalino, MD      . heparin lock flush 100 unit/mL  500 Units Intracatheter Once PRN Alvy Bimler, Nicle Connole, MD      . sodium chloride flush (NS) 0.9 % injection 10 mL  10 mL Intracatheter PRN Alvy Bimler, Jaidan Prevette, MD        PHYSICAL EXAMINATION: ECOG PERFORMANCE STATUS: 1 - Symptomatic but completely ambulatory  Vitals:   07/15/20 1134  BP: (!) 146/65  Pulse: 77  Resp: 18  Temp: 97.8 F (36.6 C)  SpO2: 99%   Filed Weights   07/15/20 1134  Weight: 153 lb 6.4 oz (69.6 kg)    GENERAL:alert, no distress and comfortable  NEURO:  alert & oriented x 3 with fluent speech, no focal motor/sensory deficits  LABORATORY DATA:  I have reviewed the data as listed    Component Value Date/Time   NA 142 07/15/2020 1112   K 4.2 07/15/2020 1112   CL 106 07/15/2020 1112   CO2 24 07/15/2020 1112   GLUCOSE 98 07/15/2020 1112   BUN 17 07/15/2020 1112   CREATININE 0.78 07/15/2020 1112   CALCIUM 9.2 07/15/2020 1112   PROT 7.1 07/15/2020 1112   ALBUMIN 3.9 07/15/2020 1112   AST 19 07/15/2020 1112   ALT 18 07/15/2020 1112   ALKPHOS 54 07/15/2020 1112   BILITOT 0.5 07/15/2020 1112   GFRNONAA >60 07/15/2020 1112   GFRAA >60 01/17/2020 1339    No results found for: SPEP, UPEP  Lab Results  Component Value Date   WBC 5.6 07/15/2020   NEUTROABS 2.5 07/15/2020   HGB 12.2 07/15/2020   HCT 37.7 07/15/2020   MCV 94.5 07/15/2020   PLT 169 07/15/2020      Chemistry      Component Value Date/Time   NA 142 07/15/2020 1112   K 4.2 07/15/2020 1112   CL 106 07/15/2020 1112   CO2 24 07/15/2020 1112   BUN 17 07/15/2020 1112   CREATININE 0.78 07/15/2020 1112      Component Value Date/Time   CALCIUM 9.2 07/15/2020 1112   ALKPHOS 54 07/15/2020 1112   AST 19 07/15/2020 1112   ALT 18 07/15/2020 1112   BILITOT 0.5 07/15/2020 1112

## 2020-07-15 NOTE — Assessment & Plan Note (Signed)
Her blood pressure is borderline high I recommend she continues on amlodipine She will continue to check her blood pressure twice a day and I will review her blood pressure control in her next visit

## 2020-07-16 ENCOUNTER — Telehealth: Payer: Self-pay

## 2020-07-16 LAB — URINE CULTURE: Culture: NO GROWTH

## 2020-07-16 LAB — CA 125: Cancer Antigen (CA) 125: 35.3 U/mL (ref 0.0–38.1)

## 2020-07-16 NOTE — Telephone Encounter (Signed)
-----   Message from [name], MD sent at 07/16/2020 12:58 PM EDT ----- Pls let her know urine culture is clear CA-125 is very good

## 2020-07-16 NOTE — Telephone Encounter (Signed)
Called and given below message. She verbalized understanding. She took the Diflucan tab she had at home yesterday and feels better today. She thinks she had a yeast infection.

## 2020-07-21 ENCOUNTER — Telehealth: Payer: Self-pay | Admitting: Oncology

## 2020-07-21 NOTE — Telephone Encounter (Signed)
Misty Hopkins called and said she noticed last night that she has a red rash on her ankles up to the front part of her calves. She said it looks like measles and is not raised or itchy.  She is on doxycyline for a sinus infection and started taking it 4 days ago.  She is not able to send in a picture because she does not have a camera. She is wondering if this could be from the antibiotic or from chemotherapy.

## 2020-07-21 NOTE — Telephone Encounter (Signed)
Avastin only does not cause rash Antibiotics would not cause rash on ankles only I suggest her to be evaluated by local MD or urgent care

## 2020-07-21 NOTE — Telephone Encounter (Signed)
Called Misty Hopkins back and advised her of message below from Dr. Alvy Bimler.  She said she is going to call her PCP - Dr. Jenean Lindau.  She also mentioned that her hands sometimes look red like she has held them in hot water.  She also said her finger tips sometimes tingle like they are asleep.  She said she first noticed this in December or January and had mentioned it to Dr. Alvy Bimler.  She is wondering if this will eventually go away. Discussed that this may get better over time and to mention it again at her next appointment.

## 2020-07-22 ENCOUNTER — Inpatient Hospital Stay: Payer: Medicare HMO | Admitting: Hematology and Oncology

## 2020-07-22 ENCOUNTER — Inpatient Hospital Stay: Payer: Medicare HMO

## 2020-07-22 ENCOUNTER — Other Ambulatory Visit: Payer: Self-pay

## 2020-07-22 ENCOUNTER — Encounter: Payer: Self-pay | Admitting: Hematology and Oncology

## 2020-07-22 DIAGNOSIS — C5701 Malignant neoplasm of right fallopian tube: Secondary | ICD-10-CM | POA: Diagnosis not present

## 2020-07-22 DIAGNOSIS — R21 Rash and other nonspecific skin eruption: Secondary | ICD-10-CM | POA: Insufficient documentation

## 2020-07-22 DIAGNOSIS — C569 Malignant neoplasm of unspecified ovary: Secondary | ICD-10-CM

## 2020-07-22 DIAGNOSIS — R0981 Nasal congestion: Secondary | ICD-10-CM

## 2020-07-22 DIAGNOSIS — Z7189 Other specified counseling: Secondary | ICD-10-CM

## 2020-07-22 DIAGNOSIS — Z5112 Encounter for antineoplastic immunotherapy: Secondary | ICD-10-CM | POA: Diagnosis not present

## 2020-07-22 LAB — CMP (CANCER CENTER ONLY)
ALT: 11 U/L (ref 0–44)
AST: 16 U/L (ref 15–41)
Albumin: 3.7 g/dL (ref 3.5–5.0)
Alkaline Phosphatase: 57 U/L (ref 38–126)
Anion gap: 10 (ref 5–15)
BUN: 24 mg/dL — ABNORMAL HIGH (ref 8–23)
CO2: 27 mmol/L (ref 22–32)
Calcium: 8.9 mg/dL (ref 8.9–10.3)
Chloride: 107 mmol/L (ref 98–111)
Creatinine: 0.8 mg/dL (ref 0.44–1.00)
GFR, Estimated: >60 mL/min (ref >60)
Glucose, Bld: 120 mg/dL — ABNORMAL HIGH (ref 70–99)
Potassium: 4.2 mmol/L (ref 3.5–5.1)
Sodium: 144 mmol/L (ref 135–145)
Total Bilirubin: 0.3 mg/dL (ref 0.3–1.2)
Total Protein: 6.7 g/dL (ref 6.5–8.1)

## 2020-07-22 LAB — CBC WITH DIFFERENTIAL (CANCER CENTER ONLY)
Abs Immature Granulocytes: 0.03 10*3/uL (ref 0.00–0.07)
Basophils Absolute: 0 10*3/uL (ref 0.0–0.1)
Basophils Relative: 0 %
Eosinophils Absolute: 0.1 10*3/uL (ref 0.0–0.5)
Eosinophils Relative: 1 %
HCT: 36.6 % (ref 36.0–46.0)
Hemoglobin: 12 g/dL (ref 12.0–15.0)
Immature Granulocytes: 0 %
Lymphocytes Relative: 36 %
Lymphs Abs: 2.4 10*3/uL (ref 0.7–4.0)
MCH: 30.7 pg (ref 26.0–34.0)
MCHC: 32.8 g/dL (ref 30.0–36.0)
MCV: 93.6 fL (ref 80.0–100.0)
Monocytes Absolute: 1.2 10*3/uL — ABNORMAL HIGH (ref 0.1–1.0)
Monocytes Relative: 17 %
Neutro Abs: 3 10*3/uL (ref 1.7–7.7)
Neutrophils Relative %: 46 %
Platelet Count: 143 10*3/uL — ABNORMAL LOW (ref 150–400)
RBC: 3.91 MIL/uL (ref 3.87–5.11)
RDW: 14.8 % (ref 11.5–15.5)
WBC Count: 6.8 10*3/uL (ref 4.0–10.5)
nRBC: 0 % (ref 0.0–0.2)

## 2020-07-22 LAB — TOTAL PROTEIN, URINE DIPSTICK: Protein, ur: NEGATIVE mg/dL

## 2020-07-22 NOTE — Progress Notes (Signed)
[facility] OFFICE PROGRESS NOTE  Patient Care Team: Allie Dimmer, MD as PCP - General (Internal Medicine) [name], RN as Oncology Nurse Navigator (Oncology)  ASSESSMENT & PLAN:  Fallopian tube cancer, carcinoma, right ([place]) Overall, she tolerated treatment well I do not believe her skin rash is due to bevacizumab Even though her documented blood pressure today is high but at home they were within normal limits I will see her next month as scheduled  Sinus congestion She has mild persistent congestive symptoms and was prescribed another course of antibiotics with doxycycline I told the patient to stop her antibiotics She just completed a course of Augmentin Her persistent symptoms are not likely due to bacterial infection and I am concerned about risk of C. difficile with multiple courses of antibiotics I recommend conservative approach with over-the-counter allergy medications  Skin rash She has persistent skin rash limited to her ankles and anterior shin areas Even though they appear petechiae somewhat on the anterior shin areas, her platelet count is normal I do not believe this is related to side effects of bevacizumab It is possible that she has exposure to some element in her environment especially when she is out gardening and hence the affected areas are focused on her feet and ankles only I reassured her that this is not likely due to side effects of medications I recommend close observation only at this point   No orders of the defined types were placed in this encounter.   All questions were answered. The patient knows to call the clinic with any problems, questions or concerns. The total time spent in the appointment was 20 minutes encounter with patients including review of chart and various tests results, discussions about plan of care and coordination of care plan   [name], MD 07/22/2020 1:50 PM  INTERVAL HISTORY: Please see below for  problem oriented charting. She is seen urgently per patient request She noticed a new skin rash this last few days focused on her ankles and shin areas only They are not itchy She was prescribed with a course of doxycycline soon after I saw her because she had persistent lingering sinus congestive symptoms She has no fever or chills  SUMMARY OF ONCOLOGIC HISTORY: Oncology History Overview Note  High grade serous, right fallopian tube  HRD, BRCA tumor testing were neg   Ovarian cancer ([place])  03/21/2018 Initial Diagnosis   Ovarian cancer ([place])   02/04/2020 -  Chemotherapy   The patient had carboplatin and doxil for chemotherapy treatment.     Fallopian tube cancer, carcinoma, right ([place])  10/24/2017 Initial Diagnosis   She has presentation of vague abdominal pain   02/20/2018 Imaging   Outside CT abdomen and pelvis: This revealed an incidental finding of a 1.5 cm subcapsular lesion in the lateral upper pole of the left kidney which measured higher than fluid attenuation which could represent a proteinaceous renal cyst or solid renal mass.  There was no ascites.  There is no lymphadenopathy.  There were no masses in the pelvis including ovarian or adnexal masses seen.  There was however soft tissue stranding seen with nodularity in the omental fat in the lower abdomen without evidence of ascites.  This was felt to represent either carcinomatosis or inflammatory infectious etiologies.  There was colonic diverticulosis seen without radiographic evidence of diverticulitis.  The radiologist recommended MRI of the abdomen to further work-up the renal mass   02/22/2018 Tumor Marker   Patient's tumor was  tested for the following markers: CA-125. Results of the tumor marker test revealed 103.   03/06/2018 Pathology Results   Omentum, biopsy - ADENOCARCINOMA WITH PSAMMOMA BODIES, SEE COMMENT. Microscopic Comment There are scattered small foci of malignant glands with associated psammoma bodies.  While the tumor is somewhat limited the cells have a more low grade appearance. There is prominent lymphovascular invasion. Immunohistochemistry reveals the cells are positive for cytokeratin 7, ER, MOC31, PAX8, and WT-1. They are negative for calretinin, cytokeratin 20, CDX-2, and TTF-1. Overall, the findings are consistent with adenocarcinoma. The differential includes a primary gynecologic or peritoneal tumor, although the morphology is not typical of a high grade serous carcinoma.   03/06/2018 Surgery   Surgeon: Donaciano Eva   Pre-operative Diagnosis: omental mass, elevated CA 125 Operation: Laparoscopic omentectomy  Surgeon: Donaciano Eva  Operative Findings:  : normal diaphragm and upper omentum. Sigmoid colon densely adherent to left pelvic side wall consistent with history of diverticulitis. Omentum adherent to anterior abdominal wall and sigmoid colon. Surgically absent uterus. Right ovary very small and grossly normal. Left ovary obscured by adhesed sigmoid colon. No ascites. No carcinomatosis. There was a nodular firm distal omentum on the left which was adherent to the sigmoid colon and most consistent with inflammatory change.      03/21/2018 Pathology Results   1. Soft tissue mass, simple excision, midline abdominal wall mass - METASTATIC SEROUS CARCINOMA. 2. Omentum, resection for tumor - METASTATIC SEROUS CARCINOMA. 3. Adnexa - ovary +/- tube, neoplastic, right - RIGHT FALLOPIAN TUBE: -HIGH GRADE SEROUS CARCINOMA. -SEE ONCOLOGY TABLE. - RIGHT OVARY: SURFACE PSAMMOMA BODIES. NO DEFINITIVE MALIGNANT CELLS. -INCLUSION CYSTS. 4. Adnexa - ovary +/- tube, neoplastic, left - LEFT FALLOPIAN TUBE: LUMINAL AND SURFACE SEROUS CARCINOMA. - LEFT OVARY: FOCAL PSAMMOMA BODIES AND MALIGNANT CELLS. 5. Soft tissue mass, simple excision, left abdominal wall - METASTATIC SEROUS CARCINOMA. Microscopic Comment 3. OVARY or FALLOPIAN TUBE or PRIMARY  PERITONEUM: Procedure: Bilateral salpingo-oophorectomy. Abdominal wall mass excision and omentectomy. Specimen Integrity: Intact. Tumor Site: Right fallopian tube. Ovarian Surface Involvement (required only if applicable): Present (left). Fallopian Tube Surface Involvement (required only if applicable): Present. Tumor Size: 1.5 cm. Histologic Type: High grade serous carcinoma. Histologic Grade: High grade. Implants (required for advanced stage serous/seromucinous borderline tumors only): Present. Other Tissue/ Organ Involvement: Omentum, abdominal wall, left fallopian tube and ovary. Largest Extrapelvic Peritoneal Focus (required only if applicable): >2 cm. Peritoneal/Ascitic Fluid: N/A. Treatment Effect (required only for high-grade serous carcinomas): N/A. Regional Lymph Nodes: No lymph nodes submitted or found Pathologic Stage Classification (pTNM, AJCC 8th Edition): pT3c, pNX Representative Tumor Block: 3A Comment(s): None.   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   Surgeon: Thereasa Solo, MD. Specimens: Bilateral tubes / ovaries, omentum. Midline abdominal wall mass, left lateral abdominal wall mass.    Operative Findings:  Abdominal wall masses consistent with port site metastases at the umbilical incision (7cm) and left lateral incision (5cm). Omental caking in the infracolic omentum. Grossly normal very small tubes and ovaries. Normal diaphragms. No lymphadenopathy. Normal small and large intestine with the exception of miliary studding of tumor on the surface of the sigmoid colon and rectum in the cul de sac.    This represented an optimal cytoreduction (R1) with no gross visible disease remaining, however there was a thin rind of tumor on  the lateral left  fascia associated with the port site metastasis.     04/11/2018 Cancer Staging   Staging form: Ovary, Fallopian Tube, and Primary Peritoneal Carcinoma, AJCC 8th Edition - Pathologic: FIGO Stage IIIC (pT3, pN0, cM0) - Signed by [name], MD on 04/11/2018   05/23/2018 Imaging   1. Interval resolution of the anterior midline abdominal wall seroma. There is some persistent wispy soft tissue attenuation in the region of the midline incision, nonspecific and may simply reflect granulation tissue from surgery. 2. Stable 13 mm exophytic lesion posterior left kidney with attenuation higher than expected for a simple cyst. This may be a cyst complicated by proteinaceous debris or hemorrhage, but attention on follow-up recommended. 3. Stable mild persistent distention of proximal jejunal loops with gradual tapering to nondilated distal small bowel. 4. No overt peritoneal or mesenteric disease identified on this exam.   05/23/2018 Tumor Marker   Patient's tumor was tested for the following markers: CA-125 Results of the tumor marker test revealed 11.6   06/02/2018 - 09/20/2018 Chemotherapy   The patient had carboplatin and taxol   06/19/2018 Procedure   Status post right IJ port catheter placement. Catheter ready for use.   06/26/2018 Tumor Marker   Patient's tumor was tested for the following markers: CA-125. Results of the tumor marker test revealed 10.7   09/20/2018 Tumor Marker   Patient's tumor was tested for the following markers: CA-125. Results of the tumor marker test revealed 9.1   10/25/2018 Imaging   1. There is suspicious, abnormal asymmetric increased soft tissue involving the cecum and ascending colon. This is suspicious for residual/progressive serosal involvement by tumor. 2. No additional sites of disease identified. No evidence for nodal metastasis, solid organ metastasis or ascites.     11/07/2018 Procedure   She had negative colonoscopy evaluation   12/05/2018 Tumor Marker    Patient's tumor was tested for the following markers: CA-125 Results of the tumor marker test revealed 8.6   01/25/2019 Imaging   Ct abdomen and pelvis Signs of omentectomy and hysterectomy without definitive signs of residual recurrent disease. Small nodular focus bridging rectum and vagina is stable dating back January to 12/14/2018, potentially postoperative but suggest attention on subsequent imaging.   01/25/2019 Tumor Marker   Patient's tumor was tested for the following markers: CA-125 Results of the tumor marker test revealed 10.3   03/15/2019 Tumor Marker   Patient's tumor was tested for the following markers: CA-125 Results of the tumor marker test revealed 9.6   03/23/2019 Imaging   No acute findings in the abdomen or pelvis.   Colonic diverticulosis.  No active diverticulitis.   Prior open tectum E and hysterectomy.   Aortic atherosclerosis.     05/10/2019 Tumor Marker   Patient's tumor was tested for the following markers: CA-125 Results of the tumor marker test revealed 11.8   05/22/2019 Imaging   1. Stable exam. No evidence of recurrent or metastatic carcinoma within the abdomen or pelvis. 2. Colonic diverticulosis, without radiographic evidence of diverticulitis.     11/08/2019 Imaging   1. Status post hysterectomy and bilateral oophorectomy. 2. Soft tissue thickening within the deep pelvis which when compared to multiple prior exams is felt to be slowly progressive. Especially given the elevated CA 125 level, this is suspicious for peritoneal recurrence. Potential imaging strategies include PET (which may be of low sensitivity secondary to the lack of well-defined dominant mass) or pelvic CT follow-up at 3 months. 3. Otherwise, no evidence of metastatic  disease within the chest, abdomen, or pelvis. 4. Coronary artery atherosclerosis. Aortic Atherosclerosis (ICD10-I70.0). 5. Ventral abdominal wall hernia containing nonobstructive transverse colon, as before. 6.   Possible constipation.   12/21/2019 Tumor Marker   Patient's tumor was tested for the following markers: 95.2. Results of the tumor marker test revealed CA-`125.   01/17/2020 Imaging   1. Similar appearance of nodularity in the central pelvis along the vaginal cuff. There is a particular nodular soft tissue density measuring 2.2 x 1.6 cm along the wall of the proximal rectum that is slightly more conspicuous than previously, raising concern for possible minimal progression. Similar appearance of the other areas of nodular soft tissue in the central pelvis. PET-CT may prove helpful to further evaluate. 2. No ascites. 3. Left colonic diverticulosis without diverticulitis. 4. 1.7 cm exophytic lesion upper pole left kidney with well-defined homogeneous attenuation slightly higher than would be expected for simple fluid. This is probably a complex cyst. Attention on follow-up recommended. 5. Aortic Atherosclerosis (ICD10-I70.0).   01/17/2020 Tumor Marker   Patient's tumor was tested for the following markers: CA-125 Results of the tumor marker test revealed 135   01/30/2020 Echocardiogram   1. Left ventricular ejection fraction, by estimation, is 55 to 60%. Left ventricular ejection fraction by PLAX is 55 %. The left ventricle has normal function. The left ventricle has no regional wall motion abnormalities. Left ventricular diastolic parameters are indeterminate.  2. Right ventricular systolic function is normal. The right ventricular size is normal.  3. Left atrial size was borderline dilated.  4. The mitral valve is grossly normal. Trivial mitral valve regurgitation.  5. The aortic valve was not well visualized. Aortic valve regurgitation is trivial.  6. The inferior vena cava is normal in size with greater than 50% respiratory variability, suggesting right atrial pressure of 3 mmHg.   02/04/2020 -  Chemotherapy   The patient had carboplatin and doxil for chemotherapy treatment.     03/03/2020  Tumor Marker   Patient's tumor was tested for the following markers: CA-125 Results of the tumor marker test revealed 272   03/07/2020 Procedure   1. Aspiration of left lower quadrant fluid collection yields 5 mL simple straw-colored serous fluid. 2. As the fluid appears to be simple in nature, no drainage catheter was placed. 3. The aspirated fluid was sent 4 body fluid culture to confirm its sterility.   03/07/2020 Imaging   VQ scan No perfusion defects evident. No findings indicative of pulmonary embolus.   03/07/2020 Imaging   Outside CT Multiple serosal implants again noted within the pelvis. However, increasing peritoneal nodularity and peritoneal thickening noted within the flanks and right upper quadrant as well as developing trace ascites within the mesentery all suspicious for progressive metastatic disease. Correlation with tumor markers may be helpful for further management.   Interval development of a 3.2 cm fluid collection within the left lower quadrant. Given its relatively rapid development, and alternative etiology such as a diverticular abscess should be considered. Recurrent disease, particular given the patient's history of serous adenocarcinoma, is not definitively excluded   03/31/2020 Tumor Marker   Patient's tumor was tested for the following markers: CA-125. Results of the tumor marker test revealed 129   04/29/2020 Tumor Marker   Patient's tumor was tested for the following markers: CA-125 Results of the tumor marker test revealed 66.5   05/22/2020 Echocardiogram   1. GLS -19.9%.  2. Left ventricular ejection fraction, by estimation, is 55 to 60%. The left ventricle has  normal function. The left ventricle has no regional wall motion abnormalities. Left ventricular diastolic parameters were normal.  3. Right ventricular systolic function is normal. The right ventricular size is normal.  4. The mitral valve is normal in structure. Trivial mitral valve  regurgitation. No evidence of mitral stenosis.  5. The aortic valve is tricuspid. Aortic valve regurgitation is not visualized. No aortic stenosis is present.  6. The inferior vena cava is normal in size with greater than 50% respiratory variability, suggesting right atrial pressure of 3 mmHg.   05/27/2020 Tumor Marker   Patient's tumor was tested for the following markers: CA-125 Results of the tumor marker test revealed 61.2   06/10/2020 Imaging   1. Stable exam. No substantial change in the scattered areas of soft tissue thickening in the central pelvis. 2. Interval resolution of the small fluid collection seen in the anterior left pelvis adjacent to small bowel on the previous study. 3. No evidence for new metastatic disease in the chest, abdomen, or pelvis. 4. Diffuse colonic diverticulosis without diverticulitis. 5. Aortic Atherosclerosis (ICD10-I70.0).   07/15/2020 Tumor Marker   Patient's tumor was tested for the following markers: CA-125 Results of the tumor marker test revealed 35.3     REVIEW OF SYSTEMS:   Constitutional: Denies fevers, chills or abnormal weight loss Eyes: Denies blurriness of vision Ears, nose, mouth, throat, and face: Denies mucositis or sore throat Respiratory: Denies cough, dyspnea or wheezes Cardiovascular: Denies palpitation, chest discomfort or lower extremity swelling Gastrointestinal:  Denies nausea, heartburn or change in bowel habits Lymphatics: Denies new lymphadenopathy or easy bruising Neurological:Denies numbness, tingling or new weaknesses Behavioral/Psych: Mood is stable, no new changes  All other systems were reviewed with the patient and are negative.  I have reviewed the past medical history, past surgical history, social history and family history with the patient and they are unchanged from previous note.  ALLERGIES:  is allergic to ivp dye [iodinated diagnostic agents], propoxyphene, codeine, and ultram [tramadol hcl].  MEDICATIONS:   Current Outpatient Medications  Medication Sig Dispense Refill  . acetaminophen (TYLENOL) 325 MG tablet Take 650 mg by mouth every 6 (six) hours as needed for mild pain, fever or headache.    Marland Kitchen amLODipine (NORVASC) 5 MG tablet Take 5 mg by mouth daily.    . Biotin 1 MG CAPS Take 1 mg by mouth daily.     . carboxymethylcellulose (REFRESH PLUS) 0.5 % SOLN Place 1 drop into both eyes 3 (three) times daily as needed (for dry eyes.).    Marland Kitchen Cholecalciferol (VITAMIN D3 SUPER STRENGTH) 50 MCG (2000 UT) TABS Take 2,000 Units by mouth daily.    Marland Kitchen HYDROmorphone (DILAUDID) 2 MG tablet Take 1 tablet (2 mg total) by mouth every 4 (four) hours as needed for severe pain. 60 tablet 0  . lidocaine-prilocaine (EMLA) cream Apply 1 application topically as needed. Port access    . ondansetron (ZOFRAN) 8 MG tablet TAKE 1 TABLET (8 MG TOTAL) BY MOUTH EVERY 8 (EIGHT) HOURS AS NEEDED FOR REFRACTORY NAUSEA / VOMITING. START ON DAY 3 AFTER CHEMO. (Patient taking differently: Take 8 mg by mouth every 8 (eight) hours as needed for nausea or vomiting. ) 30 tablet 1  . polyethylene glycol (MIRALAX / GLYCOLAX) 17 g packet Take 17 g by mouth daily. 14 each 0  . prochlorperazine (COMPAZINE) 10 MG tablet Take 1 tablet (10 mg total) by mouth every 6 (six) hours as needed (Nausea or vomiting). 30 tablet 1  . senna-docusate (SENOKOT-S)  8.6-50 MG tablet Take 2 tablets by mouth 2 (two) times daily. 90 tablet 3   No current facility-administered medications for this visit.    PHYSICAL EXAMINATION: ECOG PERFORMANCE STATUS: 1 - Symptomatic but completely ambulatory  Vitals:   07/22/20 1306  BP: (!) 161/86  Pulse: 83  Resp: 18  Temp: 98.4 F (36.9 C)  SpO2: 99%   Filed Weights   07/22/20 1306  Weight: 156 lb 6.4 oz (70.9 kg)    GENERAL:alert, no distress and comfortable SKIN: She has small petechiae rash on her shin area and erythematous changes on the bottom of her feet NEURO: alert & oriented x 3 with fluent speech, no  focal motor/sensory deficits  LABORATORY DATA:  I have reviewed the data as listed    Component Value Date/Time   NA 142 07/15/2020 1112   K 4.2 07/15/2020 1112   CL 106 07/15/2020 1112   CO2 24 07/15/2020 1112   GLUCOSE 98 07/15/2020 1112   BUN 17 07/15/2020 1112   CREATININE 0.78 07/15/2020 1112   CALCIUM 9.2 07/15/2020 1112   PROT 7.1 07/15/2020 1112   ALBUMIN 3.9 07/15/2020 1112   AST 19 07/15/2020 1112   ALT 18 07/15/2020 1112   ALKPHOS 54 07/15/2020 1112   BILITOT 0.5 07/15/2020 1112   GFRNONAA >60 07/15/2020 1112   GFRAA >60 01/17/2020 1339    No results found for: SPEP, UPEP  Lab Results  Component Value Date   WBC 6.8 07/22/2020   NEUTROABS 3.0 07/22/2020   HGB 12.0 07/22/2020   HCT 36.6 07/22/2020   MCV 93.6 07/22/2020   PLT 143 (L) 07/22/2020      Chemistry      Component Value Date/Time   NA 142 07/15/2020 1112   K 4.2 07/15/2020 1112   CL 106 07/15/2020 1112   CO2 24 07/15/2020 1112   BUN 17 07/15/2020 1112   CREATININE 0.78 07/15/2020 1112      Component Value Date/Time   CALCIUM 9.2 07/15/2020 1112   ALKPHOS 54 07/15/2020 1112   AST 19 07/15/2020 1112   ALT 18 07/15/2020 1112   BILITOT 0.5 07/15/2020 1112

## 2020-07-22 NOTE — Assessment & Plan Note (Signed)
She has persistent skin rash limited to her ankles and anterior shin areas Even though they appear petechiae somewhat on the anterior shin areas, her platelet count is normal I do not believe this is related to side effects of bevacizumab It is possible that she has exposure to some element in her environment especially when she is out gardening and hence the affected areas are focused on her feet and ankles only I reassured her that this is not likely due to side effects of medications I recommend close observation only at this point

## 2020-07-22 NOTE — Telephone Encounter (Signed)
Misty Hopkins called and said her heals on both feet appeared bruised and purple last night when she took her shoes off.  She is concerned because she did not do anything to bruise them.  They are not painful and are better this morning. She still has the red rash up to her mid calf and her legs appear a little swollen today.  Her husband is going to try to email a picture in.  She also continues to have sinus symptoms and is blowing blood clots from her nose occasionally.   She was not able to see her PCP yesterday and is wondering if she can be seen today by Dr. Alvy Bimler or the symptom management PA.

## 2020-07-22 NOTE — Assessment & Plan Note (Signed)
She has mild persistent congestive symptoms and was prescribed another course of antibiotics with doxycycline I told the patient to stop her antibiotics She just completed a course of Augmentin Her persistent symptoms are not likely due to bacterial infection and I am concerned about risk of C. difficile with multiple courses of antibiotics I recommend conservative approach with over-the-counter allergy medications

## 2020-07-22 NOTE — Assessment & Plan Note (Signed)
Overall, she tolerated treatment well I do not believe her skin rash is due to bevacizumab Even though her documented blood pressure today is high but at home they were within normal limits I will see her next month as scheduled

## 2020-07-22 NOTE — Telephone Encounter (Signed)
Scheduled labs at 1:15 and appointment with Dr. Alvy Bimler at 1:45 today.  Amanpreet is aware of the appointments.

## 2020-07-22 NOTE — Telephone Encounter (Signed)
I can see her at 145 pm Labs (prefer by hand and not through port if she agrees) before her appt Bring her BP readings and all her pill bottles Check in early

## 2020-07-31 ENCOUNTER — Telehealth: Payer: Self-pay | Admitting: Oncology

## 2020-07-31 NOTE — Telephone Encounter (Signed)
Left a message regarding email of rash.  Requested a return call with any questions.

## 2020-07-31 NOTE — Telephone Encounter (Signed)
Avital called back and said the rash is fading and does not itch.

## 2020-08-05 ENCOUNTER — Other Ambulatory Visit: Payer: Self-pay

## 2020-08-05 ENCOUNTER — Encounter: Payer: Self-pay | Admitting: Hematology and Oncology

## 2020-08-05 ENCOUNTER — Inpatient Hospital Stay: Payer: Medicare HMO

## 2020-08-05 ENCOUNTER — Inpatient Hospital Stay: Payer: Medicare HMO | Attending: Hematology and Oncology

## 2020-08-05 ENCOUNTER — Inpatient Hospital Stay (HOSPITAL_BASED_OUTPATIENT_CLINIC_OR_DEPARTMENT_OTHER): Payer: Medicare HMO | Admitting: Hematology and Oncology

## 2020-08-05 VITALS — BP 140/67 | HR 73

## 2020-08-05 DIAGNOSIS — R0981 Nasal congestion: Secondary | ICD-10-CM

## 2020-08-05 DIAGNOSIS — Z885 Allergy status to narcotic agent status: Secondary | ICD-10-CM | POA: Insufficient documentation

## 2020-08-05 DIAGNOSIS — C569 Malignant neoplasm of unspecified ovary: Secondary | ICD-10-CM

## 2020-08-05 DIAGNOSIS — R21 Rash and other nonspecific skin eruption: Secondary | ICD-10-CM | POA: Insufficient documentation

## 2020-08-05 DIAGNOSIS — C7961 Secondary malignant neoplasm of right ovary: Secondary | ICD-10-CM | POA: Insufficient documentation

## 2020-08-05 DIAGNOSIS — Z7189 Other specified counseling: Secondary | ICD-10-CM

## 2020-08-05 DIAGNOSIS — I10 Essential (primary) hypertension: Secondary | ICD-10-CM | POA: Insufficient documentation

## 2020-08-05 DIAGNOSIS — R600 Localized edema: Secondary | ICD-10-CM | POA: Diagnosis not present

## 2020-08-05 DIAGNOSIS — Z5112 Encounter for antineoplastic immunotherapy: Secondary | ICD-10-CM | POA: Diagnosis present

## 2020-08-05 DIAGNOSIS — K5730 Diverticulosis of large intestine without perforation or abscess without bleeding: Secondary | ICD-10-CM | POA: Insufficient documentation

## 2020-08-05 DIAGNOSIS — Z79899 Other long term (current) drug therapy: Secondary | ICD-10-CM | POA: Diagnosis not present

## 2020-08-05 DIAGNOSIS — I700 Atherosclerosis of aorta: Secondary | ICD-10-CM | POA: Insufficient documentation

## 2020-08-05 DIAGNOSIS — C5701 Malignant neoplasm of right fallopian tube: Secondary | ICD-10-CM | POA: Diagnosis present

## 2020-08-05 DIAGNOSIS — R233 Spontaneous ecchymoses: Secondary | ICD-10-CM | POA: Insufficient documentation

## 2020-08-05 LAB — CBC WITH DIFFERENTIAL (CANCER CENTER ONLY)
Abs Immature Granulocytes: 0.03 10*3/uL (ref 0.00–0.07)
Basophils Absolute: 0 10*3/uL (ref 0.0–0.1)
Basophils Relative: 1 %
Eosinophils Absolute: 0.1 10*3/uL (ref 0.0–0.5)
Eosinophils Relative: 1 %
HCT: 37.2 % (ref 36.0–46.0)
Hemoglobin: 12.2 g/dL (ref 12.0–15.0)
Immature Granulocytes: 0 %
Lymphocytes Relative: 30 %
Lymphs Abs: 2.5 10*3/uL (ref 0.7–4.0)
MCH: 30.5 pg (ref 26.0–34.0)
MCHC: 32.8 g/dL (ref 30.0–36.0)
MCV: 93 fL (ref 80.0–100.0)
Monocytes Absolute: 1 10*3/uL (ref 0.1–1.0)
Monocytes Relative: 12 %
Neutro Abs: 4.7 10*3/uL (ref 1.7–7.7)
Neutrophils Relative %: 56 %
Platelet Count: 151 10*3/uL (ref 150–400)
RBC: 4 MIL/uL (ref 3.87–5.11)
RDW: 14.4 % (ref 11.5–15.5)
WBC Count: 8.3 10*3/uL (ref 4.0–10.5)
nRBC: 0 % (ref 0.0–0.2)

## 2020-08-05 LAB — CMP (CANCER CENTER ONLY)
ALT: 13 U/L (ref 0–44)
AST: 19 U/L (ref 15–41)
Albumin: 3.8 g/dL (ref 3.5–5.0)
Alkaline Phosphatase: 57 U/L (ref 38–126)
Anion gap: 10 (ref 5–15)
BUN: 15 mg/dL (ref 8–23)
CO2: 25 mmol/L (ref 22–32)
Calcium: 9.2 mg/dL (ref 8.9–10.3)
Chloride: 107 mmol/L (ref 98–111)
Creatinine: 0.68 mg/dL (ref 0.44–1.00)
GFR, Estimated: >60 mL/min (ref >60)
Glucose, Bld: 93 mg/dL (ref 70–99)
Potassium: 3.7 mmol/L (ref 3.5–5.1)
Sodium: 142 mmol/L (ref 135–145)
Total Bilirubin: 0.3 mg/dL (ref 0.3–1.2)
Total Protein: 6.8 g/dL (ref 6.5–8.1)

## 2020-08-05 LAB — TOTAL PROTEIN, URINE DIPSTICK: Protein, ur: NEGATIVE mg/dL — AB

## 2020-08-05 MED ORDER — AMLODIPINE BESYLATE 10 MG PO TABS: 10.0000 mg | ORAL_TABLET | Freq: Every day | ORAL | 3 refills | Status: DC

## 2020-08-05 MED ORDER — HEPARIN SOD (PORK) LOCK FLUSH 100 UNIT/ML IV SOLN
500.0000 [IU] | Freq: Once | INTRAVENOUS | Status: AC | PRN
  Administered 2020-08-05: 500 [IU]
  Filled 2020-08-05: qty 5

## 2020-08-05 MED ORDER — SODIUM CHLORIDE 0.9% FLUSH
10.0000 mL | INTRAVENOUS | Status: DC | PRN
  Administered 2020-08-05: 10 mL
  Filled 2020-08-05: qty 10

## 2020-08-05 MED ORDER — SODIUM CHLORIDE 0.9 % IV SOLN
Freq: Once | INTRAVENOUS | Status: AC
Start: 2020-08-05 — End: 2020-08-05
  Filled 2020-08-05: qty 250

## 2020-08-05 MED ORDER — BEVACIZUMAB-AWWB CHEMO INJECTION 400 MG/16ML
15.0000 mg/kg | Freq: Once | INTRAVENOUS | Status: AC
  Administered 2020-08-05: 1100 mg via INTRAVENOUS
  Filled 2020-08-05: qty 32

## 2020-08-05 NOTE — Progress Notes (Signed)
[facility] PROGRESS NOTE  Patient Care Team: Allie Dimmer, MD as PCP - General (Internal Medicine) [name], RN as Oncology Nurse Navigator (Oncology)  ASSESSMENT & PLAN:  Fallopian tube cancer, carcinoma, right [facility] [place]) She tolerated bevacizumab well Her blood pressure at home were within normal range except for occasional systolic blood pressure in the 140s The petechiae hemorrhages seen on her shin area could be due to intermittent elevated blood pressure at home For now, I will treat her blood pressure aggressively We will proceed with bevacizumab as scheduled I plan to repeat imaging study again in May  Essential hypertension Her blood pressure at home is suboptimally controlled I recommend increasing amlodipine to 10 mg daily and for her to take in the evening She will continue twice daily blood pressure monitoring  Sinus congestion This is persistent despite multiple courses of antibiotics Suspect this is due to allergies Continue conservative approach   No orders of the defined types were placed in this encounter.   All questions were answered. The patient knows to call the clinic with any problems, questions or concerns. The total time spent in the appointment was 20 minutes encounter with patients including review of chart and various tests results, discussions about plan of care and coordination of care plan   [name], MD 08/05/2020 1:41 PM  INTERVAL HISTORY: Please see below for problem oriented charting. She returns for treatment and follow-up Her documented blood pressure from home is typically within normal range except for several numbers that were slightly high with systolic blood pressure in the 140s Her rash on her legs are fading She is active She denies abdominal pain no changes in bowel habit  SUMMARY OF ONCOLOGIC HISTORY: Oncology History Overview Note  High grade serous, right fallopian tube  HRD, BRCA tumor testing  were neg   Ovarian cancer ([place])  03/21/2018 Initial Diagnosis   Ovarian cancer ([place])   02/04/2020 -  Chemotherapy   The patient had carboplatin and doxil for chemotherapy treatment.     Fallopian tube cancer, carcinoma, right ([place])  10/24/2017 Initial Diagnosis   She has presentation of vague abdominal pain   02/20/2018 Imaging   Outside CT abdomen and pelvis: This revealed an incidental finding of a 1.5 cm subcapsular lesion in the lateral upper pole of the left kidney which measured higher than fluid attenuation which could represent a proteinaceous renal cyst or solid renal mass.  There was no ascites.  There is no lymphadenopathy.  There were no masses in the pelvis including ovarian or adnexal masses seen.  There was however soft tissue stranding seen with nodularity in the omental fat in the lower abdomen without evidence of ascites.  This was felt to represent either carcinomatosis or inflammatory infectious etiologies.  There was colonic diverticulosis seen without radiographic evidence of diverticulitis.  The radiologist recommended MRI of the abdomen to further work-up the renal mass   02/22/2018 Tumor Marker   Patient's tumor was tested for the following markers: CA-125. Results of the tumor marker test revealed 103.   03/06/2018 Pathology Results   Omentum, biopsy - ADENOCARCINOMA WITH PSAMMOMA BODIES, SEE COMMENT. Microscopic Comment There are scattered small foci of malignant glands with associated psammoma bodies. While the tumor is somewhat limited the cells have a more low grade appearance. There is prominent lymphovascular invasion. Immunohistochemistry reveals the cells are positive for cytokeratin 7, ER, MOC31, PAX8, and WT-1. They are negative for calretinin, cytokeratin 20, CDX-2, and TTF-1. Overall, the  findings are consistent with adenocarcinoma. The differential includes a primary gynecologic or peritoneal tumor, although the morphology is not typical of a high grade  serous carcinoma.   03/06/2018 Surgery   Surgeon: Donaciano Eva   Pre-operative Diagnosis: omental mass, elevated CA 125 Operation: Laparoscopic omentectomy  Surgeon: Donaciano Eva  Operative Findings:  : normal diaphragm and upper omentum. Sigmoid colon densely adherent to left pelvic side wall consistent with history of diverticulitis. Omentum adherent to anterior abdominal wall and sigmoid colon. Surgically absent uterus. Right ovary very small and grossly normal. Left ovary obscured by adhesed sigmoid colon. No ascites. No carcinomatosis. There was a nodular firm distal omentum on the left which was adherent to the sigmoid colon and most consistent with inflammatory change.      03/21/2018 Pathology Results   1. Soft tissue mass, simple excision, midline abdominal wall mass - METASTATIC SEROUS CARCINOMA. 2. Omentum, resection for tumor - METASTATIC SEROUS CARCINOMA. 3. Adnexa - ovary +/- tube, neoplastic, right - RIGHT FALLOPIAN TUBE: -HIGH GRADE SEROUS CARCINOMA. -SEE ONCOLOGY TABLE. - RIGHT OVARY: SURFACE PSAMMOMA BODIES. NO DEFINITIVE MALIGNANT CELLS. -INCLUSION CYSTS. 4. Adnexa - ovary +/- tube, neoplastic, left - LEFT FALLOPIAN TUBE: LUMINAL AND SURFACE SEROUS CARCINOMA. - LEFT OVARY: FOCAL PSAMMOMA BODIES AND MALIGNANT CELLS. 5. Soft tissue mass, simple excision, left abdominal wall - METASTATIC SEROUS CARCINOMA. Microscopic Comment 3. OVARY or FALLOPIAN TUBE or PRIMARY PERITONEUM: Procedure: Bilateral salpingo-oophorectomy. Abdominal wall mass excision and omentectomy. Specimen Integrity: Intact. Tumor Site: Right fallopian tube. Ovarian Surface Involvement (required only if applicable): Present (left). Fallopian Tube Surface Involvement (required only if applicable): Present. Tumor Size: 1.5 cm. Histologic Type: High grade serous carcinoma. Histologic Grade: High grade. Implants (required for advanced stage serous/seromucinous borderline  tumors only): Present. Other Tissue/ Organ Involvement: Omentum, abdominal wall, left fallopian tube and ovary. Largest Extrapelvic Peritoneal Focus (required only if applicable): >2 cm. Peritoneal/Ascitic Fluid: N/A. Treatment Effect (required only for high-grade serous carcinomas): N/A. Regional Lymph Nodes: No lymph nodes submitted or found Pathologic Stage Classification (pTNM, AJCC 8th Edition): pT3c, pNX Representative Tumor Block: 3A Comment(s): None.   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   Surgeon: Thereasa Solo, MD. Specimens: Bilateral tubes / ovaries, omentum. Midline abdominal wall mass, left lateral abdominal wall mass.    Operative Findings:  Abdominal wall masses consistent with port site metastases at the umbilical incision (7cm) and left lateral incision (5cm). Omental caking in the infracolic omentum. Grossly normal very small tubes and ovaries. Normal diaphragms. No lymphadenopathy. Normal small and large intestine with the exception of miliary studding of tumor on the surface of the sigmoid colon and rectum in the cul de sac.    This represented an optimal cytoreduction (R1) with no gross visible disease remaining, however there was a thin rind of tumor on the lateral left fascia associated with the port site metastasis.     04/11/2018 Cancer Staging   Staging form: Ovary, Fallopian Tube, and Primary Peritoneal Carcinoma, AJCC 8th Edition - Pathologic: FIGO Stage IIIC (pT3, pN0, cM0) - Signed by [name], MD on 04/11/2018   05/23/2018 Imaging   1. Interval resolution of the anterior midline abdominal wall seroma. There is some persistent wispy soft tissue attenuation in the region of the midline incision, nonspecific and may simply reflect  granulation tissue from surgery. 2.  Stable 13 mm exophytic lesion posterior left kidney with attenuation higher than expected for a simple cyst. This may be a cyst complicated by proteinaceous debris or hemorrhage, but attention on follow-up recommended. 3. Stable mild persistent distention of proximal jejunal loops with gradual tapering to nondilated distal small bowel. 4. No overt peritoneal or mesenteric disease identified on this exam.   05/23/2018 Tumor Marker   Patient's tumor was tested for the following markers: CA-125 Results of the tumor marker test revealed 11.6   06/02/2018 - 09/20/2018 Chemotherapy   The patient had carboplatin and taxol   06/19/2018 Procedure   Status post right IJ port catheter placement. Catheter ready for use.   06/26/2018 Tumor Marker   Patient's tumor was tested for the following markers: CA-125. Results of the tumor marker test revealed 10.7   09/20/2018 Tumor Marker   Patient's tumor was tested for the following markers: CA-125. Results of the tumor marker test revealed 9.1   10/25/2018 Imaging   1. There is suspicious, abnormal asymmetric increased soft tissue involving the cecum and ascending colon. This is suspicious for residual/progressive serosal involvement by tumor. 2. No additional sites of disease identified. No evidence for nodal metastasis, solid organ metastasis or ascites.     11/07/2018 Procedure   She had negative colonoscopy evaluation   12/05/2018 Tumor Marker   Patient's tumor was tested for the following markers: CA-125 Results of the tumor marker test revealed 8.6   01/25/2019 Imaging   Ct abdomen and pelvis Signs of omentectomy and hysterectomy without definitive signs of residual recurrent disease. Small nodular focus bridging rectum and vagina is stable dating back January to 12/14/2018, potentially postoperative but suggest attention on subsequent imaging.   01/25/2019 Tumor Marker   Patient's tumor was tested for the following  markers: CA-125 Results of the tumor marker test revealed 10.3   03/15/2019 Tumor Marker   Patient's tumor was tested for the following markers: CA-125 Results of the tumor marker test revealed 9.6   03/23/2019 Imaging   No acute findings in the abdomen or pelvis.   Colonic diverticulosis.  No active diverticulitis.   Prior open tectum E and hysterectomy.   Aortic atherosclerosis.     05/10/2019 Tumor Marker   Patient's tumor was tested for the following markers: CA-125 Results of the tumor marker test revealed 11.8   05/22/2019 Imaging   1. Stable exam. No evidence of recurrent or metastatic carcinoma within the abdomen or pelvis. 2. Colonic diverticulosis, without radiographic evidence of diverticulitis.     11/08/2019 Imaging   1. Status post hysterectomy and bilateral oophorectomy. 2. Soft tissue thickening within the deep pelvis which when compared to multiple prior exams is felt to be slowly progressive. Especially given the elevated CA 125 level, this is suspicious for peritoneal recurrence. Potential imaging strategies include PET (which may be of low sensitivity secondary to the lack of well-defined dominant mass) or pelvic CT follow-up at 3 months. 3. Otherwise, no evidence of metastatic disease within the chest, abdomen, or pelvis. 4. Coronary artery atherosclerosis. Aortic Atherosclerosis (ICD10-I70.0). 5. Ventral abdominal wall hernia containing nonobstructive transverse colon, as before. 6.  Possible constipation.   12/21/2019 Tumor Marker   Patient's tumor was tested for the following markers: 95.2. Results of the tumor marker test revealed CA-`125.   01/17/2020 Imaging   1. Similar appearance of nodularity in the central pelvis along the vaginal cuff. There is a particular nodular soft tissue density measuring 2.2 x 1.6 cm along the wall of  the proximal rectum that is slightly more conspicuous than previously, raising concern for possible minimal progression. Similar  appearance of the other areas of nodular soft tissue in the central pelvis. PET-CT may prove helpful to further evaluate. 2. No ascites. 3. Left colonic diverticulosis without diverticulitis. 4. 1.7 cm exophytic lesion upper pole left kidney with well-defined homogeneous attenuation slightly higher than would be expected for simple fluid. This is probably a complex cyst. Attention on follow-up recommended. 5. Aortic Atherosclerosis (ICD10-I70.0).   01/17/2020 Tumor Marker   Patient's tumor was tested for the following markers: CA-125 Results of the tumor marker test revealed 135   01/30/2020 Echocardiogram   1. Left ventricular ejection fraction, by estimation, is 55 to 60%. Left ventricular ejection fraction by PLAX is 55 %. The left ventricle has normal function. The left ventricle has no regional wall motion abnormalities. Left ventricular diastolic parameters are indeterminate.  2. Right ventricular systolic function is normal. The right ventricular size is normal.  3. Left atrial size was borderline dilated.  4. The mitral valve is grossly normal. Trivial mitral valve regurgitation.  5. The aortic valve was not well visualized. Aortic valve regurgitation is trivial.  6. The inferior vena cava is normal in size with greater than 50% respiratory variability, suggesting right atrial pressure of 3 mmHg.   02/04/2020 -  Chemotherapy   The patient had carboplatin and doxil for chemotherapy treatment.     03/03/2020 Tumor Marker   Patient's tumor was tested for the following markers: CA-125 Results of the tumor marker test revealed 272   03/07/2020 Procedure   1. Aspiration of left lower quadrant fluid collection yields 5 mL simple straw-colored serous fluid. 2. As the fluid appears to be simple in nature, no drainage catheter was placed. 3. The aspirated fluid was sent 4 body fluid culture to confirm its sterility.   03/07/2020 Imaging   VQ scan No perfusion defects evident. No findings  indicative of pulmonary embolus.   03/07/2020 Imaging   Outside CT Multiple serosal implants again noted within the pelvis. However, increasing peritoneal nodularity and peritoneal thickening noted within the flanks and right upper quadrant as well as developing trace ascites within the mesentery all suspicious for progressive metastatic disease. Correlation with tumor markers may be helpful for further management.   Interval development of a 3.2 cm fluid collection within the left lower quadrant. Given its relatively rapid development, and alternative etiology such as a diverticular abscess should be considered. Recurrent disease, particular given the patient's history of serous adenocarcinoma, is not definitively excluded   03/31/2020 Tumor Marker   Patient's tumor was tested for the following markers: CA-125. Results of the tumor marker test revealed 129   04/29/2020 Tumor Marker   Patient's tumor was tested for the following markers: CA-125 Results of the tumor marker test revealed 66.5   05/22/2020 Echocardiogram   1. GLS -19.9%.  2. Left ventricular ejection fraction, by estimation, is 55 to 60%. The left ventricle has normal function. The left ventricle has no regional wall motion abnormalities. Left ventricular diastolic parameters were normal.  3. Right ventricular systolic function is normal. The right ventricular size is normal.  4. The mitral valve is normal in structure. Trivial mitral valve regurgitation. No evidence of mitral stenosis.  5. The aortic valve is tricuspid. Aortic valve regurgitation is not visualized. No aortic stenosis is present.  6. The inferior vena cava is normal in size with greater than 50% respiratory variability, suggesting right atrial pressure of 3  mmHg.   05/27/2020 Tumor Marker   Patient's tumor was tested for the following markers: CA-125 Results of the tumor marker test revealed 61.2   06/10/2020 Imaging   1. Stable exam. No substantial change in the  scattered areas of soft tissue thickening in the central pelvis. 2. Interval resolution of the small fluid collection seen in the anterior left pelvis adjacent to small bowel on the previous study. 3. No evidence for new metastatic disease in the chest, abdomen, or pelvis. 4. Diffuse colonic diverticulosis without diverticulitis. 5. Aortic Atherosclerosis (ICD10-I70.0).   07/15/2020 Tumor Marker   Patient's tumor was tested for the following markers: CA-125 Results of the tumor marker test revealed 35.3     REVIEW OF SYSTEMS:   Constitutional: Denies fevers, chills or abnormal weight loss Eyes: Denies blurriness of vision Ears, nose, mouth, throat, and face: Denies mucositis or sore throat Respiratory: Denies cough, dyspnea or wheezes Cardiovascular: Denies palpitation, chest discomfort or lower extremity swelling Gastrointestinal:  Denies nausea, heartburn or change in bowel habits Skin: Denies abnormal skin rashes Lymphatics: Denies new lymphadenopathy or easy bruising Neurological:Denies numbness, tingling or new weaknesses Behavioral/Psych: Mood is stable, no new changes  All other systems were reviewed with the patient and are negative.  I have reviewed the past medical history, past surgical history, social history and family history with the patient and they are unchanged from previous note.  ALLERGIES:  is allergic to ivp dye [iodinated diagnostic agents], propoxyphene, codeine, and ultram [tramadol hcl].  MEDICATIONS:  Current Outpatient Medications  Medication Sig Dispense Refill  . amLODipine (NORVASC) 10 MG tablet Take 1 tablet (10 mg total) by mouth daily. 30 tablet 3  . acetaminophen (TYLENOL) 325 MG tablet Take 650 mg by mouth every 6 (six) hours as needed for mild pain, fever or headache.    . Biotin 1 MG CAPS Take 1 mg by mouth daily.     . carboxymethylcellulose (REFRESH PLUS) 0.5 % SOLN Place 1 drop into both eyes 3 (three) times daily as needed (for dry eyes.).     Marland Kitchen Cholecalciferol (VITAMIN D3 SUPER STRENGTH) 50 MCG (2000 UT) TABS Take 2,000 Units by mouth daily.    Marland Kitchen HYDROmorphone (DILAUDID) 2 MG tablet TAKE 1 TABLET BY MOUTH EVERY 4 HOURS AS NEEDED FOR SEVERE PAIN 60 tablet 0  . lidocaine-prilocaine (EMLA) cream Apply 1 application topically as needed. Port access    . ondansetron (ZOFRAN) 8 MG tablet TAKE 1 TABLET (8 MG TOTAL) BY MOUTH EVERY 8 (EIGHT) HOURS AS NEEDED FOR REFRACTORY NAUSEA / VOMITING. START ON DAY 3 AFTER CHEMO. (Patient taking differently: Take 8 mg by mouth every 8 (eight) hours as needed for nausea or vomiting. ) 30 tablet 1  . polyethylene glycol (MIRALAX / GLYCOLAX) 17 g packet Take 17 g by mouth daily. 14 each 0  . prochlorperazine (COMPAZINE) 10 MG tablet Take 1 tablet (10 mg total) by mouth every 6 (six) hours as needed (Nausea or vomiting). 30 tablet 1  . senna-docusate (SENOKOT-S) 8.6-50 MG tablet Take 2 tablets by mouth 2 (two) times daily. 90 tablet 3   No current facility-administered medications for this visit.   Facility-Administered Medications Ordered in Other Visits  Medication Dose Route Frequency Provider Last Rate Last Admin  . bevacizumab-awwb (MVASI) 1,100 mg in sodium chloride 0.9 % 100 mL chemo infusion  15 mg/kg (Treatment Plan Recorded) Intravenous Once Alvy Bimler, Ayub Kirsh, MD 288 mL/hr at 08/05/20 1340 1,100 mg at 08/05/20 1340  . heparin lock flush 100  unit/mL  500 Units Intracatheter Once PRN Alvy Bimler, Rorik Vespa, MD      . sodium chloride flush (NS) 0.9 % injection 10 mL  10 mL Intracatheter PRN Alvy Bimler, Braulio Kiedrowski, MD        PHYSICAL EXAMINATION: ECOG PERFORMANCE STATUS: 1 - Symptomatic but completely ambulatory  Vitals:   08/05/20 1140  BP: (!) 166/88  Pulse: 77  Resp: 16  Temp: 97.9 F (36.6 C)  SpO2: 99%   Filed Weights   08/05/20 1140  Weight: 157 lb 3.2 oz (71.3 kg)    GENERAL:alert, no distress and comfortable SKIN: Noted very mild petechiae rash EYES: normal, Conjunctiva are pink and non-injected, sclera  clear OROPHARYNX:no exudate, no erythema and lips, buccal mucosa, and tongue normal  NECK: supple, thyroid normal size, non-tender, without nodularity LYMPH:  no palpable lymphadenopathy in the cervical, axillary or inguinal LUNGS: clear to auscultation and percussion with normal breathing effort HEART: regular rate & rhythm and no murmurs very mild bilateral lower extremity edema ABDOMEN:abdomen soft, non-tender and normal bowel sounds Musculoskeletal:no cyanosis of digits and no clubbing  NEURO: alert & oriented x 3 with fluent speech, no focal motor/sensory deficits  LABORATORY DATA:  I have reviewed the data as listed    Component Value Date/Time   NA 142 08/05/2020 1115   K 3.7 08/05/2020 1115   CL 107 08/05/2020 1115   CO2 25 08/05/2020 1115   GLUCOSE 93 08/05/2020 1115   BUN 15 08/05/2020 1115   CREATININE 0.68 08/05/2020 1115   CALCIUM 9.2 08/05/2020 1115   PROT 6.8 08/05/2020 1115   ALBUMIN 3.8 08/05/2020 1115   AST 19 08/05/2020 1115   ALT 13 08/05/2020 1115   ALKPHOS 57 08/05/2020 1115   BILITOT 0.3 08/05/2020 1115   GFRNONAA >60 08/05/2020 1115   GFRAA >60 01/17/2020 1339    No results found for: SPEP, UPEP  Lab Results  Component Value Date   WBC 8.3 08/05/2020   NEUTROABS 4.7 08/05/2020   HGB 12.2 08/05/2020   HCT 37.2 08/05/2020   MCV 93.0 08/05/2020   PLT 151 08/05/2020      Chemistry      Component Value Date/Time   NA 142 08/05/2020 1115   K 3.7 08/05/2020 1115   CL 107 08/05/2020 1115   CO2 25 08/05/2020 1115   BUN 15 08/05/2020 1115   CREATININE 0.68 08/05/2020 1115      Component Value Date/Time   CALCIUM 9.2 08/05/2020 1115   ALKPHOS 57 08/05/2020 1115   AST 19 08/05/2020 1115   ALT 13 08/05/2020 1115   BILITOT 0.3 08/05/2020 1115

## 2020-08-05 NOTE — Patient Instructions (Signed)

## 2020-08-05 NOTE — Assessment & Plan Note (Signed)
Her blood pressure at home is suboptimally controlled I recommend increasing amlodipine to 10 mg daily and for her to take in the evening She will continue twice daily blood pressure monitoring

## 2020-08-05 NOTE — Progress Notes (Signed)
Okay to treat today with blood pressure of 165/73, per Dr. Alvy Bimler.

## 2020-08-05 NOTE — Patient Instructions (Signed)
[facility] Discharge Instructions for Patients Receiving Chemotherapy  Today you received the following chemotherapy agents: MVASI  To help prevent nausea and vomiting after your treatment, we encourage you to take your nausea medication as directed.Bevacizumab injection What is this medicine? BEVACIZUMAB (be va SIZ yoo mab) is a monoclonal antibody. It is used to treat many types of cancer. This medicine may be used for other purposes; ask your health care provider or pharmacist if you have questions. COMMON BRAND NAME(S): Avastin, MVASI, Zirabev What should I tell my health care provider before I take this medicine? They need to know if you have any of these conditions:  diabetes  heart disease  high blood pressure  history of coughing up blood  prior anthracycline chemotherapy (e.g., doxorubicin, daunorubicin, epirubicin)  recent or ongoing radiation therapy  recent or planning to have surgery  stroke  an unusual or allergic reaction to bevacizumab, hamster proteins, mouse proteins, other medicines, foods, dyes, or preservatives  pregnant or trying to get pregnant  breast-feeding How should I use this medicine? This medicine is for infusion into a vein. It is given by a health care professional in a hospital or clinic setting. Talk to your pediatrician regarding the use of this medicine in children. Special care may be needed. Overdosage: If you think you have taken too much of this medicine contact a poison control center or emergency room at once. NOTE: This medicine is only for you. Do not share this medicine with others. What if I miss a dose? It is important not to miss your dose. Call your doctor or health care professional if you are unable to keep an appointment. What may interact with this medicine? Interactions are not expected. This list may not describe all possible interactions. Give your health care provider a list of all the medicines,  herbs, non-prescription drugs, or dietary supplements you use. Also tell them if you smoke, drink alcohol, or use illegal drugs. Some items may interact with your medicine. What should I watch for while using this medicine? Your condition will be monitored carefully while you are receiving this medicine. You will need important blood work and urine testing done while you are taking this medicine. This medicine may increase your risk to bruise or bleed. Call your doctor or health care professional if you notice any unusual bleeding. Before having surgery, talk to your health care provider to make sure it is ok. This drug can increase the risk of poor healing of your surgical site or wound. You will need to stop this drug for 28 days before surgery. After surgery, wait at least 28 days before restarting this drug. Make sure the surgical site or wound is healed enough before restarting this drug. Talk to your health care provider if questions. Do not become pregnant while taking this medicine or for 6 months after stopping it. Women should inform their doctor if they wish to become pregnant or think they might be pregnant. There is a potential for serious side effects to an unborn child. Talk to your health care professional or pharmacist for more information. Do not breast-feed an infant while taking this medicine and for 6 months after the last dose. This medicine has caused ovarian failure in some women. This medicine may interfere with the ability to have a child. You should talk to your doctor or health care professional if you are concerned about your fertility. What side effects may I notice from receiving this medicine? Side effects  that you should report to your doctor or health care professional as soon as possible:  allergic reactions like skin rash, itching or hives, swelling of the face, lips, or tongue  chest pain or chest tightness  chills  coughing up blood  high  fever  seizures  severe constipation  signs and symptoms of bleeding such as bloody or black, tarry stools; red or dark-brown urine; spitting up blood or brown material that looks like coffee grounds; red spots on the skin; unusual bruising or bleeding from the eye, gums, or nose  signs and symptoms of a blood clot such as breathing problems; chest pain; severe, sudden headache; pain, swelling, warmth in the leg  signs and symptoms of a stroke like changes in vision; confusion; trouble speaking or understanding; severe headaches; sudden numbness or weakness of the face, arm or leg; trouble walking; dizziness; loss of balance or coordination  stomach pain  sweating  swelling of legs or ankles  vomiting  weight gain Side effects that usually do not require medical attention (report to your doctor or health care professional if they continue or are bothersome):  back pain  changes in taste  decreased appetite  dry skin  nausea  tiredness This list may not describe all possible side effects. Call your doctor for medical advice about side effects. You may report side effects to FDA at 1-800-FDA-1088. Where should I keep my medicine? This drug is given in a hospital or clinic and will not be stored at home. NOTE: This sheet is a summary. It may not cover all possible information. If you have questions about this medicine, talk to your doctor, pharmacist, or health care provider.  2021 Elsevier/Gold Standard (2019-02-07 10:50:46)    If you develop nausea and vomiting that is not controlled by your nausea medication, call the clinic.   BELOW ARE SYMPTOMS THAT SHOULD BE REPORTED IMMEDIATELY:  *FEVER GREATER THAN 100.5 F  *CHILLS WITH OR WITHOUT FEVER  NAUSEA AND VOMITING THAT IS NOT CONTROLLED WITH YOUR NAUSEA MEDICATION  *UNUSUAL SHORTNESS OF BREATH  *UNUSUAL BRUISING OR BLEEDING  TENDERNESS IN MOUTH AND THROAT WITH OR WITHOUT PRESENCE OF ULCERS  *URINARY  PROBLEMS  *BOWEL PROBLEMS  UNUSUAL RASH Items with * indicate a potential emergency and should be followed up as soon as possible.  Feel free to call the clinic should you have any questions or concerns. The clinic phone number is [phone].  Please show the [facility] at check-in to the Emergency Department and triage nurse.

## 2020-08-05 NOTE — Assessment & Plan Note (Signed)
This is persistent despite multiple courses of antibiotics Suspect this is due to allergies Continue conservative approach

## 2020-08-05 NOTE — Assessment & Plan Note (Signed)
She tolerated bevacizumab well Her blood pressure at home were within normal range except for occasional systolic blood pressure in the 140s The petechiae hemorrhages seen on her shin area could be due to intermittent elevated blood pressure at home For now, I will treat her blood pressure aggressively We will proceed with bevacizumab as scheduled I plan to repeat imaging study again in May

## 2020-08-06 LAB — CA 125: Cancer Antigen (CA) 125: 26.1 U/mL (ref 0.0–38.1)

## 2020-08-07 ENCOUNTER — Telehealth: Payer: Self-pay | Admitting: Oncology

## 2020-08-07 NOTE — Telephone Encounter (Signed)
Misty Hopkins called and asked about her CA 125 results.  Advised her of good results.  She verbalized understanding and agreement and did not have any questions.

## 2020-08-11 ENCOUNTER — Telehealth: Payer: Self-pay | Admitting: Oncology

## 2020-08-11 NOTE — Telephone Encounter (Signed)
I have reviewed the scans with her before I cannot fix chronic lower back pain She has pain med to take as needed I can schedule appt next week to discuss with her and husband whether she wants to continue Avastin or switch to something else She would need 45 mins appt

## 2020-08-11 NOTE — Telephone Encounter (Signed)
Deneise called and said she is having swelling in her ankles and a rash again in her legs. The rash is more of a brown color this time and not as bright.  She also said her legs are tingling.  She saw Dr. Silvio Pate - Vein Specialist at [facility] today. 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  She said he could not find a venous cause and thinks it may be from chemotherapy.  She also mentioned that her back pain is worse.  It is her lower back, worse on the left and hurts getting in and out of chairs.  She said it improves once she is up moving and is taking tylenol PRN.

## 2020-08-12 NOTE — Telephone Encounter (Signed)
Called Misty Hopkins and advised her of message below.  Scheduled appointment to discuss Avastin with Dr. Alvy Bimler on 08/19/20.

## 2020-08-14 ENCOUNTER — Telehealth: Payer: Self-pay | Admitting: Oncology

## 2020-08-14 NOTE — Telephone Encounter (Signed)
Misty Hopkins called and wants to cancel her appointment next week and thinks she can wait until 08/26/20 to see Dr. Alvy Bimler.  She said her legs are about the same but are not as red.

## 2020-08-19 ENCOUNTER — Telehealth: Payer: Self-pay | Admitting: Oncology

## 2020-08-19 ENCOUNTER — Other Ambulatory Visit: Payer: Self-pay | Admitting: Oncology

## 2020-08-19 ENCOUNTER — Ambulatory Visit: Payer: Medicare HMO | Admitting: Hematology and Oncology

## 2020-08-19 NOTE — Telephone Encounter (Signed)
Yes, ok to stop amlodipine and switch per primary doctor recommendations

## 2020-08-19 NOTE — Telephone Encounter (Signed)
Misty Hopkins called and said the swelling in her legs has increased and she is not able to get her shoes on.  Her legs are sore and the rash is brown/speckled up to her calves. She called her PCP yesterday and had an appointment with another doctor in the practice.  He said the swelling is probably from her blood pressure medicine and sent her in a prescription for Losartan 50 mg and wanted her to stop taking amlodipine.  She is wondering if this is ok with Dr. Alvy Bimler to take and if the dosage is correct.  She is concerned because she was taking 10 mg of amlodipine and the losartan is 50 mg. She also reported her bp readings from yesterday were 133/74 and 133/82.  This morning it was 130/76.

## 2020-08-19 NOTE — Telephone Encounter (Signed)
Called Shyvonne back and advised her of message below from Dr. Alvy Bimler. She verbalized understanding and agreement.

## 2020-08-26 ENCOUNTER — Other Ambulatory Visit: Payer: Self-pay

## 2020-08-26 ENCOUNTER — Inpatient Hospital Stay (HOSPITAL_BASED_OUTPATIENT_CLINIC_OR_DEPARTMENT_OTHER): Payer: Medicare HMO | Admitting: Hematology and Oncology

## 2020-08-26 ENCOUNTER — Encounter: Payer: Self-pay | Admitting: Hematology and Oncology

## 2020-08-26 ENCOUNTER — Inpatient Hospital Stay: Payer: Medicare HMO | Attending: Hematology and Oncology

## 2020-08-26 ENCOUNTER — Inpatient Hospital Stay: Payer: Medicare HMO

## 2020-08-26 ENCOUNTER — Other Ambulatory Visit: Payer: Medicare HMO

## 2020-08-26 VITALS — BP 158/88

## 2020-08-26 DIAGNOSIS — M545 Low back pain, unspecified: Secondary | ICD-10-CM | POA: Insufficient documentation

## 2020-08-26 DIAGNOSIS — Z7189 Other specified counseling: Secondary | ICD-10-CM

## 2020-08-26 DIAGNOSIS — I10 Essential (primary) hypertension: Secondary | ICD-10-CM

## 2020-08-26 DIAGNOSIS — R233 Spontaneous ecchymoses: Secondary | ICD-10-CM | POA: Insufficient documentation

## 2020-08-26 DIAGNOSIS — Z5112 Encounter for antineoplastic immunotherapy: Secondary | ICD-10-CM | POA: Diagnosis not present

## 2020-08-26 DIAGNOSIS — R0981 Nasal congestion: Secondary | ICD-10-CM | POA: Insufficient documentation

## 2020-08-26 DIAGNOSIS — C569 Malignant neoplasm of unspecified ovary: Secondary | ICD-10-CM

## 2020-08-26 DIAGNOSIS — D539 Nutritional anemia, unspecified: Secondary | ICD-10-CM

## 2020-08-26 DIAGNOSIS — F411 Generalized anxiety disorder: Secondary | ICD-10-CM | POA: Diagnosis not present

## 2020-08-26 DIAGNOSIS — M2550 Pain in unspecified joint: Secondary | ICD-10-CM | POA: Insufficient documentation

## 2020-08-26 DIAGNOSIS — C5701 Malignant neoplasm of right fallopian tube: Secondary | ICD-10-CM

## 2020-08-26 DIAGNOSIS — M7989 Other specified soft tissue disorders: Secondary | ICD-10-CM | POA: Insufficient documentation

## 2020-08-26 DIAGNOSIS — Z79899 Other long term (current) drug therapy: Secondary | ICD-10-CM | POA: Diagnosis not present

## 2020-08-26 DIAGNOSIS — J309 Allergic rhinitis, unspecified: Secondary | ICD-10-CM | POA: Insufficient documentation

## 2020-08-26 DIAGNOSIS — G893 Neoplasm related pain (acute) (chronic): Secondary | ICD-10-CM | POA: Insufficient documentation

## 2020-08-26 DIAGNOSIS — R519 Headache, unspecified: Secondary | ICD-10-CM | POA: Insufficient documentation

## 2020-08-26 DIAGNOSIS — M7918 Myalgia, other site: Secondary | ICD-10-CM | POA: Diagnosis not present

## 2020-08-26 DIAGNOSIS — R634 Abnormal weight loss: Secondary | ICD-10-CM | POA: Insufficient documentation

## 2020-08-26 DIAGNOSIS — R21 Rash and other nonspecific skin eruption: Secondary | ICD-10-CM

## 2020-08-26 DIAGNOSIS — R040 Epistaxis: Secondary | ICD-10-CM | POA: Diagnosis not present

## 2020-08-26 DIAGNOSIS — J302 Other seasonal allergic rhinitis: Secondary | ICD-10-CM

## 2020-08-26 DIAGNOSIS — Z885 Allergy status to narcotic agent status: Secondary | ICD-10-CM | POA: Insufficient documentation

## 2020-08-26 LAB — CBC WITH DIFFERENTIAL (CANCER CENTER ONLY)
Abs Immature Granulocytes: 0.03 10*3/uL (ref 0.00–0.07)
Basophils Absolute: 0.1 10*3/uL (ref 0.0–0.1)
Basophils Relative: 1 %
Eosinophils Absolute: 0.2 10*3/uL (ref 0.0–0.5)
Eosinophils Relative: 2 %
HCT: 35.4 % — ABNORMAL LOW (ref 36.0–46.0)
Hemoglobin: 11.8 g/dL — ABNORMAL LOW (ref 12.0–15.0)
Immature Granulocytes: 0 %
Lymphocytes Relative: 23 %
Lymphs Abs: 2.1 10*3/uL (ref 0.7–4.0)
MCH: 31 pg (ref 26.0–34.0)
MCHC: 33.3 g/dL (ref 30.0–36.0)
MCV: 92.9 fL (ref 80.0–100.0)
Monocytes Absolute: 0.9 10*3/uL (ref 0.1–1.0)
Monocytes Relative: 10 %
Neutro Abs: 5.9 10*3/uL (ref 1.7–7.7)
Neutrophils Relative %: 64 %
Platelet Count: 122 10*3/uL — ABNORMAL LOW (ref 150–400)
RBC: 3.81 MIL/uL — ABNORMAL LOW (ref 3.87–5.11)
RDW: 13.7 % (ref 11.5–15.5)
WBC Count: 9.1 10*3/uL (ref 4.0–10.5)
nRBC: 0 % (ref 0.0–0.2)

## 2020-08-26 LAB — CMP (CANCER CENTER ONLY)
ALT: 12 U/L (ref 0–44)
AST: 20 U/L (ref 15–41)
Albumin: 3.7 g/dL (ref 3.5–5.0)
Alkaline Phosphatase: 57 U/L (ref 38–126)
Anion gap: 8 (ref 5–15)
BUN: 16 mg/dL (ref 8–23)
CO2: 27 mmol/L (ref 22–32)
Calcium: 9.1 mg/dL (ref 8.9–10.3)
Chloride: 107 mmol/L (ref 98–111)
Creatinine: 0.74 mg/dL (ref 0.44–1.00)
GFR, Estimated: >60 mL/min (ref >60)
Glucose, Bld: 110 mg/dL — ABNORMAL HIGH (ref 70–99)
Potassium: 3.8 mmol/L (ref 3.5–5.1)
Sodium: 142 mmol/L (ref 135–145)
Total Bilirubin: 0.4 mg/dL (ref 0.3–1.2)
Total Protein: 6.7 g/dL (ref 6.5–8.1)

## 2020-08-26 LAB — TOTAL PROTEIN, URINE DIPSTICK

## 2020-08-26 MED ORDER — HEPARIN SOD (PORK) LOCK FLUSH 100 UNIT/ML IV SOLN
500.0000 [IU] | Freq: Once | INTRAVENOUS | Status: AC | PRN
  Administered 2020-08-26: 500 [IU]
  Filled 2020-08-26: qty 5

## 2020-08-26 MED ORDER — SODIUM CHLORIDE 0.9% FLUSH
10.0000 mL | Freq: Once | INTRAVENOUS | Status: AC
Start: 2020-08-26 — End: 2020-08-26
  Administered 2020-08-26: 10 mL
  Filled 2020-08-26: qty 10

## 2020-08-26 MED ORDER — SODIUM CHLORIDE 0.9 % IV SOLN
10.0000 mg/kg | Freq: Once | INTRAVENOUS | Status: AC
  Administered 2020-08-26: 700 mg via INTRAVENOUS
  Filled 2020-08-26: qty 16

## 2020-08-26 MED ORDER — SODIUM CHLORIDE 0.9 % IV SOLN
Freq: Once | INTRAVENOUS | Status: AC
  Filled 2020-08-26: qty 250

## 2020-08-26 MED ORDER — SODIUM CHLORIDE 0.9% FLUSH
10.0000 mL | INTRAVENOUS | Status: DC | PRN
Start: 2020-08-26 — End: 2020-08-26
  Administered 2020-08-26: 10 mL
  Filled 2020-08-26: qty 10

## 2020-08-26 NOTE — Assessment & Plan Note (Signed)
Her mild skin rash is likely due to hypertension Observe for now

## 2020-08-26 NOTE — Assessment & Plan Note (Signed)
She is getting uncontrolled hypertension with bevacizumab Her recent blood pressure medication was changed due to leg edema Her blood pressure is high today but from home monitoring, it is slightly better She had no significant proteinuria We will proceed today but I plan to reduce the bevacizumab dose from 15 mg/kg to 10 mg/kg She will continue aggressive blood pressure management at home Plan to repeat CT imaging at the end of the month Her recent tumor marker is better

## 2020-08-26 NOTE — Patient Instructions (Signed)
[facility]ICAL [facility]COL[facility]   °Discharge Instructions: °Thank you for choosing [facility] to provide your oncology and hematology care.  ° °If you have a lab appointment with the Cancer Center, please go directly to the Cancer Center and check in at the registration area. °  °Wear comfortable clothing and clothing appropriate for easy access to any Portacath or PICC line.  ° °We strive to give you quality time with your provider. You may need to reschedule your appointment if you arrive late (15 or more minutes).  Arriving late affects you and other patients whose appointments are after yours.  Also, if you miss three or more appointments without notifying the office, you may be dismissed from the clinic at the provider’s discretion.    °  °For prescription refill requests, have your pharmacy contact our office and allow 72 hours for refills to be completed.   ° °Today you received the following chemotherapy and/or immunotherapy agents: avastin  °  °To help prevent nausea and vomiting after your treatment, we encourage you to take your nausea medication as directed. ° °BELOW ARE SYMPTOMS THAT SHOULD BE REPORTED IMMEDIATELY: °*FEVER GREATER THAN 100.4 F (38 °C) OR HIGHER °*CHILLS OR SWEATING °*NAUSEA AND VOMITING THAT IS NOT CONTROLLED WITH YOUR NAUSEA MEDICATION °*UNUSUAL SHORTNESS OF BREATH °*UNUSUAL BRUISING OR BLEEDING °*URINARY PROBLEMS (pain or burning when urinating, or frequent urination) °*BOWEL PROBLEMS (unusual diarrhea, constipation, pain near the anus) °TENDERNESS IN MOUTH AND THROAT WITH OR WITHOUT PRESENCE OF ULCERS (sore throat, sores in mouth, or a toothache) °UNUSUAL RASH, SWELLING OR PAIN  °UNUSUAL VAGINAL DISCHARGE OR ITCHING  ° °Items with * indicate a potential emergency and should be followed up as soon as possible or go to the Emergency Department if any problems should occur. ° °Please show the CHEMOTHERAPY ALERT CARD or IMMUNOTHERAPY ALERT CARD at check-in to  the Emergency Department and triage nurse. ° °Should you have questions after your visit or need to cancel or reschedule your appointment, please contact [facility]  Dept: [phone]  and follow the prompts.  Office hours are 8:00 a.m. to 4:30 p.m. Monday - Friday. Please note that voicemails left after 4:00 p.m. may not be returned until the following business day.  We are closed weekends and major holidays. You have access to a nurse at all times for urgent questions. Please call the main number to the clinic Dept: [phone] and follow the prompts. ° ° °For any non-urgent questions, you may also contact your provider using MyChart. We now offer e-Visits for anyone 18 and older to request care online for non-urgent symptoms. For details visit [email or web address]. °  °Also download the MyChart app! Go to the app store, search "MyChart", open the app, select [facility], and log in with your MyChart username and password. ° °Due to Covid, a mask is required upon entering the hospital/clinic. If you do not have a mask, one will be given to you upon arrival. For doctor visits, patients may have 1 support person aged 18 or older with them. For treatment visits, patients cannot have anyone with them due to current Covid guidelines and our immunocompromised population.  ° °

## 2020-08-26 NOTE — Assessment & Plan Note (Signed)
She had some recent flare of lower back pain I reviewed her previous imaging and show some mild degenerative arthritis I recommend over-the-counter conservative approach

## 2020-08-26 NOTE — Assessment & Plan Note (Signed)
She has signs and symptoms of allergic rhinitis Recommend over-the-counter antihistamines

## 2020-08-26 NOTE — Progress Notes (Signed)
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[facility]) She is getting uncontrolled hypertension with bevacizumab Her recent blood pressure medication was changed due to leg edema Her blood pressure is high today but from home monitoring, it is slightly better She had no significant proteinuria We will proceed today but I plan to reduce the bevacizumab dose from 15 mg/kg to 10 mg/kg She will continue aggressive blood pressure management at home Plan to repeat CT imaging at the end of the month Her recent tumor marker is better  Essential hypertension She has poorly controlled hypertension As above, she will continue aggressive blood pressure management and I plan to reduce the dose of Avastin  Skin rash Her mild skin rash is likely due to hypertension Observe for now  Allergic rhinitis She has signs and symptoms of allergic rhinitis Recommend over-the-counter antihistamines  Deficiency anemia She has slight acquired pancytopenia likely due to recent treatment She is not symptomatic Observe for now  Lower back pain She had some recent flare of lower back pain I reviewed her previous imaging and show some mild degenerative arthritis I recommend over-the-counter conservative approach   No orders of the defined types were placed in this encounter.   All questions were answered. The patient knows to call the clinic with any problems, questions or concerns. The total time spent in the appointment was 30 minutes encounter with patients including review of chart and various tests results, discussions about plan of care and coordination of care plan   [name], MD 08/26/2020 1:28 PM  INTERVAL HISTORY: Please see below for problem oriented charting. She returns for further  follow-up Her blood pressure medications were recently changed due to leg swelling Amlodipine was discontinued and she was started on Cozaar Her blood pressure at home were satisfactory except for occasional systolic blood pressure in the 150s Her leg swelling has slightly improved She has mild occasional nosebleed She has significant sinus congestion  SUMMARY OF ONCOLOGIC HISTORY: Oncology History Overview Note  High grade serous, right fallopian tube  HRD, BRCA tumor testing were neg   Ovarian cancer ([place])  03/21/2018 Initial Diagnosis   Ovarian cancer ([place])   02/04/2020 -  Chemotherapy   The patient had carboplatin and doxil for chemotherapy treatment.     Fallopian tube cancer, carcinoma, right ([place])  10/24/2017 Initial Diagnosis   She has presentation of vague abdominal pain   02/20/2018 Imaging   Outside CT abdomen and pelvis: This revealed an incidental finding of a 1.5 cm subcapsular lesion in the lateral upper pole of the left kidney which measured higher than fluid attenuation which could represent a proteinaceous renal cyst or solid renal mass.  There was no ascites.  There is no lymphadenopathy.  There were no masses in the pelvis including ovarian or adnexal masses seen.  There was however soft tissue stranding seen with nodularity in the omental fat in the lower abdomen without evidence of ascites.  This was felt to represent either carcinomatosis or inflammatory infectious etiologies.  There was colonic diverticulosis seen without radiographic evidence of diverticulitis.  The radiologist recommended MRI of the abdomen to further work-up the renal mass   02/22/2018 Tumor Marker   Patient's tumor was tested for the following markers: CA-125. Results of the tumor marker test revealed 103.   03/06/2018 Pathology  Results   Omentum, biopsy - ADENOCARCINOMA WITH PSAMMOMA BODIES, SEE COMMENT. Microscopic Comment There are scattered small foci of malignant glands with  associated psammoma bodies. While the tumor is somewhat limited the cells have a more low grade appearance. There is prominent lymphovascular invasion. Immunohistochemistry reveals the cells are positive for cytokeratin 7, ER, MOC31, PAX8, and WT-1. They are negative for calretinin, cytokeratin 20, CDX-2, and TTF-1. Overall, the findings are consistent with adenocarcinoma. The differential includes a primary gynecologic or peritoneal tumor, although the morphology is not typical of a high grade serous carcinoma.   03/06/2018 Surgery   Surgeon: Donaciano Eva   Pre-operative Diagnosis: omental mass, elevated CA 125 Operation: Laparoscopic omentectomy  Surgeon: Donaciano Eva  Operative Findings:  : normal diaphragm and upper omentum. Sigmoid colon densely adherent to left pelvic side wall consistent with history of diverticulitis. Omentum adherent to anterior abdominal wall and sigmoid colon. Surgically absent uterus. Right ovary very small and grossly normal. Left ovary obscured by adhesed sigmoid colon. No ascites. No carcinomatosis. There was a nodular firm distal omentum on the left which was adherent to the sigmoid colon and most consistent with inflammatory change.      03/21/2018 Pathology Results   1. Soft tissue mass, simple excision, midline abdominal wall mass - METASTATIC SEROUS CARCINOMA. 2. Omentum, resection for tumor - METASTATIC SEROUS CARCINOMA. 3. Adnexa - ovary +/- tube, neoplastic, right - RIGHT FALLOPIAN TUBE: -HIGH GRADE SEROUS CARCINOMA. -SEE ONCOLOGY TABLE. - RIGHT OVARY: SURFACE PSAMMOMA BODIES. NO DEFINITIVE MALIGNANT CELLS. -INCLUSION CYSTS. 4. Adnexa - ovary +/- tube, neoplastic, left - LEFT FALLOPIAN TUBE: LUMINAL AND SURFACE SEROUS CARCINOMA. - LEFT OVARY: FOCAL PSAMMOMA BODIES AND MALIGNANT CELLS. 5. Soft tissue mass, simple excision, left abdominal wall - METASTATIC SEROUS CARCINOMA. Microscopic Comment 3. OVARY or FALLOPIAN TUBE or  PRIMARY PERITONEUM: Procedure: Bilateral salpingo-oophorectomy. Abdominal wall mass excision and omentectomy. Specimen Integrity: Intact. Tumor Site: Right fallopian tube. Ovarian Surface Involvement (required only if applicable): Present (left). Fallopian Tube Surface Involvement (required only if applicable): Present. Tumor Size: 1.5 cm. Histologic Type: High grade serous carcinoma. Histologic Grade: High grade. Implants (required for advanced stage serous/seromucinous borderline tumors only): Present. Other Tissue/ Organ Involvement: Omentum, abdominal wall, left fallopian tube and ovary. Largest Extrapelvic Peritoneal Focus (required only if applicable): >2 cm. Peritoneal/Ascitic Fluid: N/A. Treatment Effect (required only for high-grade serous carcinomas): N/A. Regional Lymph Nodes: No lymph nodes submitted or found Pathologic Stage Classification (pTNM, AJCC 8th Edition): pT3c, pNX Representative Tumor Block: 3A Comment(s): None.   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   Surgeon: Thereasa Solo, MD. Specimens: Bilateral tubes / ovaries, omentum. Midline abdominal wall mass, left lateral abdominal wall mass.    Operative Findings:  Abdominal wall masses consistent with port site metastases at the umbilical incision (7cm) and left lateral incision (5cm). Omental caking in the infracolic omentum. Grossly normal very small tubes and ovaries. Normal diaphragms. No lymphadenopathy. Normal small and large intestine with the exception of miliary studding of tumor on the surface of the sigmoid colon and rectum in the cul de sac.    This represented an optimal cytoreduction (R1) with no gross visible disease remaining, however there was a thin rind of tumor on the  lateral left fascia associated with the port site metastasis.     04/11/2018 Cancer Staging  Staging form: Ovary, Fallopian Tube, and Primary Peritoneal Carcinoma, AJCC 8th Edition - Pathologic: FIGO Stage IIIC (pT3, pN0, cM0) - Signed by [name], MD on 04/11/2018   05/23/2018 Imaging   1. Interval resolution of the anterior midline abdominal wall seroma. There is some persistent wispy soft tissue attenuation in the region of the midline incision, nonspecific and may simply reflect granulation tissue from surgery. 2. Stable 13 mm exophytic lesion posterior left kidney with attenuation higher than expected for a simple cyst. This may be a cyst complicated by proteinaceous debris or hemorrhage, but attention on follow-up recommended. 3. Stable mild persistent distention of proximal jejunal loops with gradual tapering to nondilated distal small bowel. 4. No overt peritoneal or mesenteric disease identified on this exam.   05/23/2018 Tumor Marker   Patient's tumor was tested for the following markers: CA-125 Results of the tumor marker test revealed 11.6   06/02/2018 - 09/20/2018 Chemotherapy   The patient had carboplatin and taxol   06/19/2018 Procedure   Status post right IJ port catheter placement. Catheter ready for use.   06/26/2018 Tumor Marker   Patient's tumor was tested for the following markers: CA-125. Results of the tumor marker test revealed 10.7   09/20/2018 Tumor Marker   Patient's tumor was tested for the following markers: CA-125. Results of the tumor marker test revealed 9.1   10/25/2018 Imaging   1. There is suspicious, abnormal asymmetric increased soft tissue involving the cecum and ascending colon. This is suspicious for residual/progressive serosal involvement by tumor. 2. No additional sites of disease identified. No evidence for nodal metastasis, solid organ metastasis or ascites.     11/07/2018 Procedure   She had negative colonoscopy evaluation   12/05/2018  Tumor Marker   Patient's tumor was tested for the following markers: CA-125 Results of the tumor marker test revealed 8.6   01/25/2019 Imaging   Ct abdomen and pelvis Signs of omentectomy and hysterectomy without definitive signs of residual recurrent disease. Small nodular focus bridging rectum and vagina is stable dating back January to 12/14/2018, potentially postoperative but suggest attention on subsequent imaging.   01/25/2019 Tumor Marker   Patient's tumor was tested for the following markers: CA-125 Results of the tumor marker test revealed 10.3   03/15/2019 Tumor Marker   Patient's tumor was tested for the following markers: CA-125 Results of the tumor marker test revealed 9.6   03/23/2019 Imaging   No acute findings in the abdomen or pelvis.   Colonic diverticulosis.  No active diverticulitis.   Prior open tectum E and hysterectomy.   Aortic atherosclerosis.     05/10/2019 Tumor Marker   Patient's tumor was tested for the following markers: CA-125 Results of the tumor marker test revealed 11.8   05/22/2019 Imaging   1. Stable exam. No evidence of recurrent or metastatic carcinoma within the abdomen or pelvis. 2. Colonic diverticulosis, without radiographic evidence of diverticulitis.     11/08/2019 Imaging   1. Status post hysterectomy and bilateral oophorectomy. 2. Soft tissue thickening within the deep pelvis which when compared to multiple prior exams is felt to be slowly progressive. Especially given the elevated CA 125 level, this is suspicious for peritoneal recurrence. Potential imaging strategies include PET (which may be of low sensitivity secondary to the lack of well-defined dominant mass) or pelvic CT follow-up at 3 months. 3. Otherwise, no evidence of metastatic disease within the chest, abdomen, or pelvis. 4. Coronary artery atherosclerosis. Aortic Atherosclerosis (ICD10-I70.0). 5. Ventral abdominal wall hernia containing  nonobstructive transverse colon, as  before. 6.  Possible constipation.   12/21/2019 Tumor Marker   Patient's tumor was tested for the following markers: 95.2. Results of the tumor marker test revealed CA-`125.   01/17/2020 Imaging   1. Similar appearance of nodularity in the central pelvis along the vaginal cuff. There is a particular nodular soft tissue density measuring 2.2 x 1.6 cm along the wall of the proximal rectum that is slightly more conspicuous than previously, raising concern for possible minimal progression. Similar appearance of the other areas of nodular soft tissue in the central pelvis. PET-CT may prove helpful to further evaluate. 2. No ascites. 3. Left colonic diverticulosis without diverticulitis. 4. 1.7 cm exophytic lesion upper pole left kidney with well-defined homogeneous attenuation slightly higher than would be expected for simple fluid. This is probably a complex cyst. Attention on follow-up recommended. 5. Aortic Atherosclerosis (ICD10-I70.0).   01/17/2020 Tumor Marker   Patient's tumor was tested for the following markers: CA-125 Results of the tumor marker test revealed 135   01/30/2020 Echocardiogram   1. Left ventricular ejection fraction, by estimation, is 55 to 60%. Left ventricular ejection fraction by PLAX is 55 %. The left ventricle has normal function. The left ventricle has no regional wall motion abnormalities. Left ventricular diastolic parameters are indeterminate.  2. Right ventricular systolic function is normal. The right ventricular size is normal.  3. Left atrial size was borderline dilated.  4. The mitral valve is grossly normal. Trivial mitral valve regurgitation.  5. The aortic valve was not well visualized. Aortic valve regurgitation is trivial.  6. The inferior vena cava is normal in size with greater than 50% respiratory variability, suggesting right atrial pressure of 3 mmHg.   02/04/2020 -  Chemotherapy   The patient had carboplatin and doxil for chemotherapy treatment.      03/03/2020 Tumor Marker   Patient's tumor was tested for the following markers: CA-125 Results of the tumor marker test revealed 272   03/07/2020 Procedure   1. Aspiration of left lower quadrant fluid collection yields 5 mL simple straw-colored serous fluid. 2. As the fluid appears to be simple in nature, no drainage catheter was placed. 3. The aspirated fluid was sent 4 body fluid culture to confirm its sterility.   03/07/2020 Imaging   VQ scan No perfusion defects evident. No findings indicative of pulmonary embolus.   03/07/2020 Imaging   Outside CT Multiple serosal implants again noted within the pelvis. However, increasing peritoneal nodularity and peritoneal thickening noted within the flanks and right upper quadrant as well as developing trace ascites within the mesentery all suspicious for progressive metastatic disease. Correlation with tumor markers may be helpful for further management.   Interval development of a 3.2 cm fluid collection within the left lower quadrant. Given its relatively rapid development, and alternative etiology such as a diverticular abscess should be considered. Recurrent disease, particular given the patient's history of serous adenocarcinoma, is not definitively excluded   03/31/2020 Tumor Marker   Patient's tumor was tested for the following markers: CA-125. Results of the tumor marker test revealed 129   04/29/2020 Tumor Marker   Patient's tumor was tested for the following markers: CA-125 Results of the tumor marker test revealed 66.5   05/22/2020 Echocardiogram   1. GLS -19.9%.  2. Left ventricular ejection fraction, by estimation, is 55 to 60%. The left ventricle has normal function. The left ventricle has no regional wall motion abnormalities. Left ventricular diastolic parameters were normal.  3. Right  ventricular systolic function is normal. The right ventricular size is normal.  4. The mitral valve is normal in structure. Trivial mitral valve  regurgitation. No evidence of mitral stenosis.  5. The aortic valve is tricuspid. Aortic valve regurgitation is not visualized. No aortic stenosis is present.  6. The inferior vena cava is normal in size with greater than 50% respiratory variability, suggesting right atrial pressure of 3 mmHg.   05/27/2020 Tumor Marker   Patient's tumor was tested for the following markers: CA-125 Results of the tumor marker test revealed 61.2   06/10/2020 Imaging   1. Stable exam. No substantial change in the scattered areas of soft tissue thickening in the central pelvis. 2. Interval resolution of the small fluid collection seen in the anterior left pelvis adjacent to small bowel on the previous study. 3. No evidence for new metastatic disease in the chest, abdomen, or pelvis. 4. Diffuse colonic diverticulosis without diverticulitis. 5. Aortic Atherosclerosis (ICD10-I70.0).   07/15/2020 Tumor Marker   Patient's tumor was tested for the following markers: CA-125 Results of the tumor marker test revealed 35.3   08/06/2020 Tumor Marker   Patient's tumor was tested for the following markers: CA-125 Results of the tumor marker test revealed 26.1.     REVIEW OF SYSTEMS:   Constitutional: Denies fevers, chills or abnormal weight loss Eyes: Denies blurriness of vision Ears, nose, mouth, throat, and face: Denies mucositis or sore throat Respiratory: Denies cough, dyspnea or wheezes Cardiovascular: Denies palpitation, chest discomfort  Gastrointestinal:  Denies nausea, heartburn or change in bowel habits Skin: Denies abnormal skin rashes Lymphatics: Denies new lymphadenopathy or easy bruising Neurological:Denies numbness, tingling or new weaknesses Behavioral/Psych: Mood is stable, no new changes  All other systems were reviewed with the patient and are negative.  I have reviewed the past medical history, past surgical history, social history and family history with the patient and they are unchanged from  previous note.  ALLERGIES:  is allergic to ivp dye [iodinated diagnostic agents], propoxyphene, codeine, and ultram [tramadol hcl].  MEDICATIONS:  Current Outpatient Medications  Medication Sig Dispense Refill  . acetaminophen (TYLENOL) 325 MG tablet Take 650 mg by mouth every 6 (six) hours as needed for mild pain, fever or headache.    Marland Kitchen amLODipine (NORVASC) 10 MG tablet Take 1 tablet (10 mg total) by mouth daily. 30 tablet 3  . Biotin 1 MG CAPS Take 1 mg by mouth daily.     . carboxymethylcellulose (REFRESH PLUS) 0.5 % SOLN Place 1 drop into both eyes 3 (three) times daily as needed (for dry eyes.).    Marland Kitchen Cholecalciferol (VITAMIN D3 SUPER STRENGTH) 50 MCG (2000 UT) TABS Take 2,000 Units by mouth daily.    Marland Kitchen HYDROmorphone (DILAUDID) 2 MG tablet TAKE 1 TABLET BY MOUTH EVERY 4 HOURS AS NEEDED FOR SEVERE PAIN 60 tablet 0  . lidocaine-prilocaine (EMLA) cream Apply 1 application topically as needed. Port access    . losartan (COZAAR) 50 MG tablet Take 1 tablet by mouth daily.    . ondansetron (ZOFRAN) 8 MG tablet TAKE 1 TABLET (8 MG TOTAL) BY MOUTH EVERY 8 (EIGHT) HOURS AS NEEDED FOR REFRACTORY NAUSEA / VOMITING. START ON DAY 3 AFTER CHEMO. (Patient taking differently: Take 8 mg by mouth every 8 (eight) hours as needed for nausea or vomiting. ) 30 tablet 1  . polyethylene glycol (MIRALAX / GLYCOLAX) 17 g packet Take 17 g by mouth daily. 14 each 0  . prochlorperazine (COMPAZINE) 10 MG tablet Take 1 tablet (  10 mg total) by mouth every 6 (six) hours as needed (Nausea or vomiting). 30 tablet 1  . senna-docusate (SENOKOT-S) 8.6-50 MG tablet Take 2 tablets by mouth 2 (two) times daily. 90 tablet 3   No current facility-administered medications for this visit.   Facility-Administered Medications Ordered in Other Visits  Medication Dose Route Frequency Provider Last Rate Last Admin  . sodium chloride flush (NS) 0.9 % injection 10 mL  10 mL Intracatheter PRN Alvy Bimler, Nikia Mangino, MD   10 mL at 08/26/20 1306     PHYSICAL EXAMINATION: ECOG PERFORMANCE STATUS: 1 - Symptomatic but completely ambulatory  Vitals:   08/26/20 1040  BP: (!) 165/84  Pulse: 78  Resp: 18  Temp: 97.8 F (36.6 C)  SpO2: 98%   Filed Weights   08/26/20 1040  Weight: 154 lb 9.6 oz (70.1 kg)    GENERAL:alert, no distress and comfortable SKIN: She has some mild petechiae hemorrhage on both shin area EYES: normal, Conjunctiva are pink and non-injected, sclera clear OROPHARYNX:no exudate, no erythema and lips, buccal mucosa, and tongue normal  NECK: supple, thyroid normal size, non-tender, without nodularity LYMPH:  no palpable lymphadenopathy in the cervical, axillary or inguinal LUNGS: clear to auscultation and percussion with normal breathing effort HEART: regular rate & rhythm and no murmurs noted mild bilateral lower extremity edema ABDOMEN:abdomen soft, non-tender and normal bowel sounds Musculoskeletal:no cyanosis of digits and no clubbing  NEURO: alert & oriented x 3 with fluent speech, no focal motor/sensory deficits  LABORATORY DATA:  I have reviewed the data as listed    Component Value Date/Time   NA 142 08/26/2020 1013   K 3.8 08/26/2020 1013   CL 107 08/26/2020 1013   CO2 27 08/26/2020 1013   GLUCOSE 110 (H) 08/26/2020 1013   BUN 16 08/26/2020 1013   CREATININE 0.74 08/26/2020 1013   CALCIUM 9.1 08/26/2020 1013   PROT 6.7 08/26/2020 1013   ALBUMIN 3.7 08/26/2020 1013   AST 20 08/26/2020 1013   ALT 12 08/26/2020 1013   ALKPHOS 57 08/26/2020 1013   BILITOT 0.4 08/26/2020 1013   GFRNONAA >60 08/26/2020 1013   GFRAA >60 01/17/2020 1339    No results found for: SPEP, UPEP  Lab Results  Component Value Date   WBC 9.1 08/26/2020   NEUTROABS 5.9 08/26/2020   HGB 11.8 (L) 08/26/2020   HCT 35.4 (L) 08/26/2020   MCV 92.9 08/26/2020   PLT 122 (L) 08/26/2020      Chemistry      Component Value Date/Time   NA 142 08/26/2020 1013   K 3.8 08/26/2020 1013   CL 107 08/26/2020 1013   CO2 27  08/26/2020 1013   BUN 16 08/26/2020 1013   CREATININE 0.74 08/26/2020 1013      Component Value Date/Time   CALCIUM 9.1 08/26/2020 1013   ALKPHOS 57 08/26/2020 1013   AST 20 08/26/2020 1013   ALT 12 08/26/2020 1013   BILITOT 0.4 08/26/2020 1013

## 2020-08-26 NOTE — Assessment & Plan Note (Signed)
She has slight acquired pancytopenia likely due to recent treatment She is not symptomatic Observe for now

## 2020-08-26 NOTE — Patient Instructions (Signed)

## 2020-08-26 NOTE — Assessment & Plan Note (Signed)
She has poorly controlled hypertension As above, she will continue aggressive blood pressure management and I plan to reduce the dose of Avastin

## 2020-08-28 ENCOUNTER — Telehealth: Payer: Self-pay | Admitting: Oncology

## 2020-08-28 NOTE — Telephone Encounter (Signed)
Misty Hopkins called and asked if her CA 125 was drawn with her labs on 08/26/20.  Advised her it wasn't drawn because it was too early and will be drawn at her next lab appointment.  She also received a statement from her insurance and is wondering about the amounts charged.  Advised her to call the billing department.

## 2020-09-04 ENCOUNTER — Telehealth: Payer: Self-pay | Admitting: Oncology

## 2020-09-04 ENCOUNTER — Other Ambulatory Visit: Payer: Self-pay | Admitting: Hematology and Oncology

## 2020-09-04 DIAGNOSIS — I10 Essential (primary) hypertension: Secondary | ICD-10-CM

## 2020-09-04 MED ORDER — METOPROLOL TARTRATE 25 MG PO TABS: 25.0000 mg | ORAL_TABLET | Freq: Two times a day (BID) | ORAL | 3 refills | Status: DC

## 2020-09-04 NOTE — Telephone Encounter (Signed)
Kalayna left a message saying that her PCP, Dr. Jenean Lindau is out for a week.  The doctor covering for Dr. Jenean Lindau is the one who changed her to Losartan and only saw her for "5 minutes" and she does not want to go back to him.  She is not sure what to do and wants to know if Dr. Alvy Bimler can manage her BP.

## 2020-09-04 NOTE — Telephone Encounter (Signed)
Called Bulah and let her know about new prescription for metoprolol and to continue taking the losartan along with it.  She verbalized agreement and will continue to check her BP.

## 2020-09-04 NOTE — Telephone Encounter (Signed)
Her PCP changed her BP recently and we discussed having just one doctor managing her BP, I advise her to call her PCP to see if the dose needs to be adjusted It is not the cause of her back pain. She has pain med to take as needed

## 2020-09-04 NOTE — Telephone Encounter (Signed)
Misty Hopkins called with blood pressure readings:  08/24/20 147/89 08/25/20 131/82 am 125/78 pm 08/26/20 165/84 am 08/27/20 138/96 pm 08/28/20 149/84 08/29/20 140/82 08/30/20 134/89 am 161/91 pm 08/31/20 147/87 09/01/20 148/86 am 161/93 pm 09/02/20 137/79 am 123/77 pm 09/03/20 123/79 am 163/101 pm retaken 151/91 09/04/20 160/93 am  She is concerned because she has had some readings over 417 systolic.  She continues to have pain in the lower left side of her back and legs and is wondering if this is the cause.  She is taking pain medicine as needed and also still taking losartan daily.

## 2020-09-04 NOTE — Telephone Encounter (Signed)
Called Misty Hopkins back and advised her of message below.  She verbalized understanding and will call her PCP today.

## 2020-09-04 NOTE — Telephone Encounter (Signed)
I sent metoprolol to her pharmacy, take BID along with losartan and continue checking her BP

## 2020-09-16 ENCOUNTER — Inpatient Hospital Stay (HOSPITAL_BASED_OUTPATIENT_CLINIC_OR_DEPARTMENT_OTHER): Payer: Medicare HMO | Admitting: Hematology and Oncology

## 2020-09-16 ENCOUNTER — Other Ambulatory Visit: Payer: Self-pay

## 2020-09-16 ENCOUNTER — Other Ambulatory Visit (HOSPITAL_COMMUNITY): Payer: Self-pay

## 2020-09-16 ENCOUNTER — Inpatient Hospital Stay: Payer: Medicare HMO

## 2020-09-16 ENCOUNTER — Other Ambulatory Visit: Payer: Medicare HMO

## 2020-09-16 VITALS — BP 163/71 | HR 63 | Temp 97.8°F | Resp 18 | Ht 64.0 in | Wt 152.6 lb

## 2020-09-16 DIAGNOSIS — Z7189 Other specified counseling: Secondary | ICD-10-CM

## 2020-09-16 DIAGNOSIS — Z5112 Encounter for antineoplastic immunotherapy: Secondary | ICD-10-CM | POA: Diagnosis not present

## 2020-09-16 DIAGNOSIS — G893 Neoplasm related pain (acute) (chronic): Secondary | ICD-10-CM | POA: Diagnosis not present

## 2020-09-16 DIAGNOSIS — C5701 Malignant neoplasm of right fallopian tube: Secondary | ICD-10-CM

## 2020-09-16 DIAGNOSIS — C569 Malignant neoplasm of unspecified ovary: Secondary | ICD-10-CM

## 2020-09-16 DIAGNOSIS — I10 Essential (primary) hypertension: Secondary | ICD-10-CM

## 2020-09-16 DIAGNOSIS — F411 Generalized anxiety disorder: Secondary | ICD-10-CM

## 2020-09-16 LAB — CBC WITH DIFFERENTIAL (CANCER CENTER ONLY)
Abs Immature Granulocytes: 0.03 10*3/uL (ref 0.00–0.07)
Basophils Absolute: 0 10*3/uL (ref 0.0–0.1)
Basophils Relative: 0 %
Eosinophils Absolute: 0.1 10*3/uL (ref 0.0–0.5)
Eosinophils Relative: 1 %
HCT: 37.6 % (ref 36.0–46.0)
Hemoglobin: 12.7 g/dL (ref 12.0–15.0)
Immature Granulocytes: 0 %
Lymphocytes Relative: 28 %
Lymphs Abs: 2.3 10*3/uL (ref 0.7–4.0)
MCH: 30.9 pg (ref 26.0–34.0)
MCHC: 33.8 g/dL (ref 30.0–36.0)
MCV: 91.5 fL (ref 80.0–100.0)
Monocytes Absolute: 0.8 10*3/uL (ref 0.1–1.0)
Monocytes Relative: 10 %
Neutro Abs: 5 10*3/uL (ref 1.7–7.7)
Neutrophils Relative %: 61 %
Platelet Count: 131 10*3/uL — ABNORMAL LOW (ref 150–400)
RBC: 4.11 MIL/uL (ref 3.87–5.11)
RDW: 13.2 % (ref 11.5–15.5)
WBC Count: 8.4 10*3/uL (ref 4.0–10.5)
nRBC: 0 % (ref 0.0–0.2)

## 2020-09-16 LAB — CMP (CANCER CENTER ONLY)
ALT: 12 U/L (ref 0–44)
AST: 18 U/L (ref 15–41)
Albumin: 3.6 g/dL (ref 3.5–5.0)
Alkaline Phosphatase: 55 U/L (ref 38–126)
Anion gap: 10 (ref 5–15)
BUN: 17 mg/dL (ref 8–23)
CO2: 25 mmol/L (ref 22–32)
Calcium: 9 mg/dL (ref 8.9–10.3)
Chloride: 107 mmol/L (ref 98–111)
Creatinine: 0.74 mg/dL (ref 0.44–1.00)
GFR, Estimated: >60 mL/min (ref >60)
Glucose, Bld: 118 mg/dL — ABNORMAL HIGH (ref 70–99)
Potassium: 4 mmol/L (ref 3.5–5.1)
Sodium: 142 mmol/L (ref 135–145)
Total Bilirubin: 0.6 mg/dL (ref 0.3–1.2)
Total Protein: 6.5 g/dL (ref 6.5–8.1)

## 2020-09-16 LAB — TOTAL PROTEIN, URINE DIPSTICK

## 2020-09-16 MED ORDER — HYDROMORPHONE HCL 2 MG PO TABS
ORAL_TABLET | ORAL | 0 refills | Status: AC | PRN
  Filled 2020-09-16: qty 60, 10d supply, fill #0

## 2020-09-16 MED ORDER — SODIUM CHLORIDE 0.9% FLUSH
10.0000 mL | Freq: Once | INTRAVENOUS | Status: AC
  Administered 2020-09-16: 10 mL
  Filled 2020-09-16: qty 10

## 2020-09-17 ENCOUNTER — Encounter: Payer: Self-pay | Admitting: Hematology and Oncology

## 2020-09-17 ENCOUNTER — Telehealth: Payer: Self-pay | Admitting: Oncology

## 2020-09-17 DIAGNOSIS — F411 Generalized anxiety disorder: Secondary | ICD-10-CM | POA: Insufficient documentation

## 2020-09-17 LAB — CA 125: Cancer Antigen (CA) 125: 26 U/mL (ref 0.0–38.1)

## 2020-09-17 NOTE — Assessment & Plan Note (Signed)
She continues to have significant elevated blood pressure, could be exacerbated by pain or anxiety For now, I will hold her treatment She will continue her current antihypertensives

## 2020-09-17 NOTE — Assessment & Plan Note (Signed)
She has diffuse bone pain and diffuse muscle pain which cannot be explained with her ongoing treatment For now, I recommend she continue her prescribed pain medicine as needed As above, I plan to order imaging study

## 2020-09-17 NOTE — Assessment & Plan Note (Signed)
She has numerous different symptoms which I do not believe is related to side effects of Avastin Specifically, her diffuse joint pain, diffuse musculoskeletal pain and feeling unwell are unlikely due to side effects of bevacizumab I recommend we stop treatment and repeat imaging study Even though her tumor marker was within normal limits, due to her significant uncontrolled symptoms, I think it is prudent that we rule out progression of disease She is in agreement We will cancel her treatment and repeat imaging study

## 2020-09-17 NOTE — Assessment & Plan Note (Signed)
I sense that the patient is profoundly anxious In my opinion, she has profound fear that her symptoms of pain is related to cancer or side effects of treatment I tried to reassure her to the best of my ability I reassured her that withholding treatment today we will not make significant impact the benefits of treatment I explained to the patient that it is important that we rule out treatable causes

## 2020-09-17 NOTE — Telephone Encounter (Signed)
Misty Hopkins called and asked if she needs to do the prep for her IV contrast allergy for the CT scan on 09/24/20.  Advised her since she is not getting the IV contrast she does not need to take the prep.  She verbalized understanding.  Also scheduled apt to review CT results with Dr. Alvy Bimler on 09/26/20 at 1:45.

## 2020-09-17 NOTE — Progress Notes (Signed)
[facility] OFFICE PROGRESS NOTE  Patient Care Team: Buchanan, Galadriel, MD as PCP - General (Internal Medicine) Hess, Karen R, RN as Oncology Nurse Navigator (Oncology)  ASSESSMENT & PLAN:  Fallopian tube cancer, carcinoma, right (HCC) She has numerous different symptoms which I do not believe is related to side effects of Avastin Specifically, her diffuse joint pain, diffuse musculoskeletal pain and feeling unwell are unlikely due to side effects of bevacizumab I recommend we stop treatment and repeat imaging study Even though her tumor marker was within normal limits, due to her significant uncontrolled symptoms, I think it is prudent that we rule out progression of disease She is in agreement We will cancel her treatment and repeat imaging study  Cancer associated pain She has diffuse bone pain and diffuse muscle pain which cannot be explained with her ongoing treatment For now, I recommend she continue her prescribed pain medicine as needed As above, I plan to order imaging study  Essential hypertension She continues to have significant elevated blood pressure, could be exacerbated by pain or anxiety For now, I will hold her treatment She will continue her current antihypertensives  Anxiety, generalized I sense that the patient is profoundly anxious In my opinion, she has profound fear that her symptoms of pain is related to cancer or side effects of treatment I tried to reassure her to the best of my ability I reassured her that withholding treatment today we will not make significant impact the benefits of treatment I explained to the patient that it is important that we rule out treatable causes   Orders Placed This Encounter  Procedures  . CT Abdomen Pelvis Wo Contrast    Standing Status:   Future    Standing Expiration Date:   09/16/2021    Order Specific Question:   Preferred imaging location?    Answer:   [facility]    Order Specific Question:    Is Oral Contrast requested for this exam?    Answer:   Yes, Per Radiology protocol    All questions were answered. The patient knows to call the clinic with any problems, questions or concerns. The total time spent in the appointment was 40 minutes encounter with patients including review of chart and various tests results, discussions about plan of care and coordination of care plan   Ni Gorsuch, MD 09/17/2020 11:44 AM  INTERVAL HISTORY: Please see below for problem oriented charting. She is here accompanied by her husband She is not feeling well She has lost some weight She stated she has severe diffuse joint pain throughout her body especially her back radiating down to her legs Her joint pain is worse with certain position She also have the diffuse musculoskeletal pain She has intermittent headaches She has elevated blood pressure at home between systolic blood pressure 1 50-1 60 Unfortunately, she did not bring her documented blood pressure from home Repeatedly, she asked whether her petechiae that was noted at the beginning of treatment with bevacizumab has anything to do with her current symptoms  SUMMARY OF ONCOLOGIC HISTORY: Oncology History Overview Note  High grade serous, right fallopian tube  HRD, BRCA tumor testing were neg   Ovarian cancer (HCC)  03/21/2018 Initial Diagnosis   Ovarian cancer (HCC)   02/04/2020 -  Chemotherapy   The patient had carboplatin and doxil for chemotherapy treatment.     Fallopian tube cancer, carcinoma, right (HCC)  10/24/2017 Initial Diagnosis   She has presentation of vague abdominal pain     02/20/2018 Imaging   Outside CT abdomen and pelvis: This revealed an incidental finding of a 1.5 cm subcapsular lesion in the lateral upper pole of the left kidney which measured higher than fluid attenuation which could represent a proteinaceous renal cyst or solid renal mass.  There was no ascites.  There is no lymphadenopathy.  There were no  masses in the pelvis including ovarian or adnexal masses seen.  There was however soft tissue stranding seen with nodularity in the omental fat in the lower abdomen without evidence of ascites.  This was felt to represent either carcinomatosis or inflammatory infectious etiologies.  There was colonic diverticulosis seen without radiographic evidence of diverticulitis.  The radiologist recommended MRI of the abdomen to further work-up the renal mass   02/22/2018 Tumor Marker   Patient's tumor was tested for the following markers: CA-125. Results of the tumor marker test revealed 103.   03/06/2018 Pathology Results   Omentum, biopsy - ADENOCARCINOMA WITH PSAMMOMA BODIES, SEE COMMENT. Microscopic Comment There are scattered small foci of malignant glands with associated psammoma bodies. While the tumor is somewhat limited the cells have a more low grade appearance. There is prominent lymphovascular invasion. Immunohistochemistry reveals the cells are positive for cytokeratin 7, ER, MOC31, PAX8, and WT-1. They are negative for calretinin, cytokeratin 20, CDX-2, and TTF-1. Overall, the findings are consistent with adenocarcinoma. The differential includes a primary gynecologic or peritoneal tumor, although the morphology is not typical of a high grade serous carcinoma.   03/06/2018 Surgery   Surgeon: Rossi, Emma Caroline   Pre-operative Diagnosis: omental mass, elevated CA 125 Operation: Laparoscopic omentectomy  Surgeon: Rossi, Emma Caroline  Operative Findings:  : normal diaphragm and upper omentum. Sigmoid colon densely adherent to left pelvic side wall consistent with history of diverticulitis. Omentum adherent to anterior abdominal wall and sigmoid colon. Surgically absent uterus. Right ovary very small and grossly normal. Left ovary obscured by adhesed sigmoid colon. No ascites. No carcinomatosis. There was a nodular firm distal omentum on the left which was adherent to the sigmoid colon  and most consistent with inflammatory change.      03/21/2018 Pathology Results   1. Soft tissue mass, simple excision, midline abdominal wall mass - METASTATIC SEROUS CARCINOMA. 2. Omentum, resection for tumor - METASTATIC SEROUS CARCINOMA. 3. Adnexa - ovary +/- tube, neoplastic, right - RIGHT FALLOPIAN TUBE: -HIGH GRADE SEROUS CARCINOMA. -SEE ONCOLOGY TABLE. - RIGHT OVARY: SURFACE PSAMMOMA BODIES. NO DEFINITIVE MALIGNANT CELLS. -INCLUSION CYSTS. 4. Adnexa - ovary +/- tube, neoplastic, left - LEFT FALLOPIAN TUBE: LUMINAL AND SURFACE SEROUS CARCINOMA. - LEFT OVARY: FOCAL PSAMMOMA BODIES AND MALIGNANT CELLS. 5. Soft tissue mass, simple excision, left abdominal wall - METASTATIC SEROUS CARCINOMA. Microscopic Comment 3. OVARY or FALLOPIAN TUBE or PRIMARY PERITONEUM: Procedure: Bilateral salpingo-oophorectomy. Abdominal wall mass excision and omentectomy. Specimen Integrity: Intact. Tumor Site: Right fallopian tube. Ovarian Surface Involvement (required only if applicable): Present (left). Fallopian Tube Surface Involvement (required only if applicable): Present. Tumor Size: 1.5 cm. Histologic Type: High grade serous carcinoma. Histologic Grade: High grade. Implants (required for advanced stage serous/seromucinous borderline tumors only): Present. Other Tissue/ Organ Involvement: Omentum, abdominal wall, left fallopian tube and ovary. Largest Extrapelvic Peritoneal Focus (required only if applicable): >2 cm. Peritoneal/Ascitic Fluid: N/A. Treatment Effect (required only for high-grade serous carcinomas): N/A. Regional Lymph Nodes: No lymph nodes submitted or found Pathologic Stage Classification (pTNM, AJCC 8th Edition): pT3c, pNX Representative Tumor Block: 3A Comment(s): None.   03/21/2018 Surgery   Preoperative Diagnosis: stage IIIC   primary peritoneal vs ovarian cancer   Procedure(s) Performed:  Exploratory laparotomy with bilateral salpingo-oophorectomy, omentectomy  radical tumor debulking for ovarian cancer, ventral hernia repair.   Surgeon: Thereasa Solo, MD. Specimens: Bilateral tubes / ovaries, omentum. Midline abdominal wall mass, left lateral abdominal wall mass.    Operative Findings:  Abdominal wall masses consistent with port site metastases at the umbilical incision (7cm) and left lateral incision (5cm). Omental caking in the infracolic omentum. Grossly normal very small tubes and ovaries. Normal diaphragms. No lymphadenopathy. Normal small and large intestine with the exception of miliary studding of tumor on the surface of the sigmoid colon and rectum in the cul de sac.    This represented an optimal cytoreduction (R1) with no gross visible disease remaining, however there was a thin rind of tumor on the lateral left fascia associated with the port site metastasis.     04/11/2018 Cancer Staging   Staging form: Ovary, Fallopian Tube, and Primary Peritoneal Carcinoma, AJCC 8th Edition - Pathologic: FIGO Stage IIIC (pT3, pN0, cM0) - Signed by [name], MD on 04/11/2018   05/23/2018 Imaging   1. Interval resolution of the anterior midline abdominal wall seroma. There is some persistent wispy soft tissue attenuation in the region of the midline incision, nonspecific and may simply reflect granulation tissue from surgery. 2. Stable 13 mm exophytic lesion posterior left kidney with attenuation higher than expected for a simple cyst. This may be a cyst complicated by proteinaceous debris or hemorrhage, but attention on follow-up recommended. 3. Stable mild persistent distention of proximal jejunal loops with gradual tapering to nondilated distal small bowel. 4. No overt peritoneal or mesenteric disease identified on this exam.   05/23/2018 Tumor Marker   Patient's tumor was tested for the following markers: CA-125 Results of the tumor marker test revealed 11.6   06/02/2018 - 09/20/2018 Chemotherapy   The patient had carboplatin and taxol    06/19/2018 Procedure   Status post right IJ port catheter placement. Catheter ready for use.   06/26/2018 Tumor Marker   Patient's tumor was tested for the following markers: CA-125. Results of the tumor marker test revealed 10.7   09/20/2018 Tumor Marker   Patient's tumor was tested for the following markers: CA-125. Results of the tumor marker test revealed 9.1   10/25/2018 Imaging   1. There is suspicious, abnormal asymmetric increased soft tissue involving the cecum and ascending colon. This is suspicious for residual/progressive serosal involvement by tumor. 2. No additional sites of disease identified. No evidence for nodal metastasis, solid organ metastasis or ascites.     11/07/2018 Procedure   She had negative colonoscopy evaluation   12/05/2018 Tumor Marker   Patient's tumor was tested for the following markers: CA-125 Results of the tumor marker test revealed 8.6   01/25/2019 Imaging   Ct abdomen and pelvis Signs of omentectomy and hysterectomy without definitive signs of residual recurrent disease. Small nodular focus bridging rectum and vagina is stable dating back January to 12/14/2018, potentially postoperative but suggest attention on subsequent imaging.   01/25/2019 Tumor Marker   Patient's tumor was tested for the following markers: CA-125 Results of the tumor marker test revealed 10.3   03/15/2019 Tumor Marker   Patient's tumor was tested for the following markers: CA-125 Results of the tumor marker test revealed 9.6   03/23/2019 Imaging   No acute findings in the abdomen or pelvis.   Colonic diverticulosis.  No active diverticulitis.   Prior open tectum E and hysterectomy.  Aortic atherosclerosis.     05/10/2019 Tumor Marker   Patient's tumor was tested for the following markers: CA-125 Results of the tumor marker test revealed 11.8   05/22/2019 Imaging   1. Stable exam. No evidence of recurrent or metastatic carcinoma within the abdomen or pelvis. 2.  Colonic diverticulosis, without radiographic evidence of diverticulitis.     11/08/2019 Imaging   1. Status post hysterectomy and bilateral oophorectomy. 2. Soft tissue thickening within the deep pelvis which when compared to multiple prior exams is felt to be slowly progressive. Especially given the elevated CA 125 level, this is suspicious for peritoneal recurrence. Potential imaging strategies include PET (which may be of low sensitivity secondary to the lack of well-defined dominant mass) or pelvic CT follow-up at 3 months. 3. Otherwise, no evidence of metastatic disease within the chest, abdomen, or pelvis. 4. Coronary artery atherosclerosis. Aortic Atherosclerosis (ICD10-I70.0). 5. Ventral abdominal wall hernia containing nonobstructive transverse colon, as before. 6.  Possible constipation.   12/21/2019 Tumor Marker   Patient's tumor was tested for the following markers: 95.2. Results of the tumor marker test revealed CA-`125.   01/17/2020 Imaging   1. Similar appearance of nodularity in the central pelvis along the vaginal cuff. There is a particular nodular soft tissue density measuring 2.2 x 1.6 cm along the wall of the proximal rectum that is slightly more conspicuous than previously, raising concern for possible minimal progression. Similar appearance of the other areas of nodular soft tissue in the central pelvis. PET-CT may prove helpful to further evaluate. 2. No ascites. 3. Left colonic diverticulosis without diverticulitis. 4. 1.7 cm exophytic lesion upper pole left kidney with well-defined homogeneous attenuation slightly higher than would be expected for simple fluid. This is probably a complex cyst. Attention on follow-up recommended. 5. Aortic Atherosclerosis (ICD10-I70.0).   01/17/2020 Tumor Marker   Patient's tumor was tested for the following markers: CA-125 Results of the tumor marker test revealed 135   01/30/2020 Echocardiogram   1. Left ventricular ejection fraction,  by estimation, is 55 to 60%. Left ventricular ejection fraction by PLAX is 55 %. The left ventricle has normal function. The left ventricle has no regional wall motion abnormalities. Left ventricular diastolic parameters are indeterminate.  2. Right ventricular systolic function is normal. The right ventricular size is normal.  3. Left atrial size was borderline dilated.  4. The mitral valve is grossly normal. Trivial mitral valve regurgitation.  5. The aortic valve was not well visualized. Aortic valve regurgitation is trivial.  6. The inferior vena cava is normal in size with greater than 50% respiratory variability, suggesting right atrial pressure of 3 mmHg.   02/04/2020 -  Chemotherapy   The patient had carboplatin and doxil for chemotherapy treatment.     03/03/2020 Tumor Marker   Patient's tumor was tested for the following markers: CA-125 Results of the tumor marker test revealed 272   03/07/2020 Procedure   1. Aspiration of left lower quadrant fluid collection yields 5 mL simple straw-colored serous fluid. 2. As the fluid appears to be simple in nature, no drainage catheter was placed. 3. The aspirated fluid was sent 4 body fluid culture to confirm its sterility.   03/07/2020 Imaging   VQ scan No perfusion defects evident. No findings indicative of pulmonary embolus.   03/07/2020 Imaging   Outside CT Multiple serosal implants again noted within the pelvis. However, increasing peritoneal nodularity and peritoneal thickening noted within the flanks and right upper quadrant as well as developing trace   ascites within the mesentery all suspicious for progressive metastatic disease. Correlation with tumor markers may be helpful for further management.   Interval development of a 3.2 cm fluid collection within the left lower quadrant. Given its relatively rapid development, and alternative etiology such as a diverticular abscess should be considered. Recurrent disease, particular given  the patient's history of serous adenocarcinoma, is not definitively excluded   03/31/2020 Tumor Marker   Patient's tumor was tested for the following markers: CA-125. Results of the tumor marker test revealed 129   04/29/2020 Tumor Marker   Patient's tumor was tested for the following markers: CA-125 Results of the tumor marker test revealed 66.5   05/22/2020 Echocardiogram   1. GLS -19.9%.  2. Left ventricular ejection fraction, by estimation, is 55 to 60%. The left ventricle has normal function. The left ventricle has no regional wall motion abnormalities. Left ventricular diastolic parameters were normal.  3. Right ventricular systolic function is normal. The right ventricular size is normal.  4. The mitral valve is normal in structure. Trivial mitral valve regurgitation. No evidence of mitral stenosis.  5. The aortic valve is tricuspid. Aortic valve regurgitation is not visualized. No aortic stenosis is present.  6. The inferior vena cava is normal in size with greater than 50% respiratory variability, suggesting right atrial pressure of 3 mmHg.   05/27/2020 Tumor Marker   Patient's tumor was tested for the following markers: CA-125 Results of the tumor marker test revealed 61.2   06/10/2020 Imaging   1. Stable exam. No substantial change in the scattered areas of soft tissue thickening in the central pelvis. 2. Interval resolution of the small fluid collection seen in the anterior left pelvis adjacent to small bowel on the previous study. 3. No evidence for new metastatic disease in the chest, abdomen, or pelvis. 4. Diffuse colonic diverticulosis without diverticulitis. 5. Aortic Atherosclerosis (ICD10-I70.0).   07/15/2020 Tumor Marker   Patient's tumor was tested for the following markers: CA-125 Results of the tumor marker test revealed 35.3   08/06/2020 Tumor Marker   Patient's tumor was tested for the following markers: CA-125 Results of the tumor marker test revealed 26.1.    09/16/2020 Tumor Marker   Patient's tumor was tested for the following markers: CA-125 Results of the tumor marker test revealed 26     REVIEW OF SYSTEMS:   Constitutional: Denies fevers, chills or abnormal weight loss Eyes: Denies blurriness of vision Ears, nose, mouth, throat, and face: Denies mucositis or sore throat Respiratory: Denies cough, dyspnea or wheezes Cardiovascular: Denies palpitation, chest discomfort or lower extremity swelling Gastrointestinal:  Denies nausea, heartburn or change in bowel habits Skin: Denies abnormal skin rashes Lymphatics: Denies new lymphadenopathy or easy bruising Neurological:Denies numbness, tingling or new weaknesses Behavioral/Psych: Mood is stable, no new changes  All other systems were reviewed with the patient and are negative.  I have reviewed the past medical history, past surgical history, social history and family history with the patient and they are unchanged from previous note.  ALLERGIES:  is allergic to ivp dye [iodinated diagnostic agents], propoxyphene, codeine, and ultram [tramadol hcl].  MEDICATIONS:  Current Outpatient Medications  Medication Sig Dispense Refill  . acetaminophen (TYLENOL) 325 MG tablet Take 650 mg by mouth every 6 (six) hours as needed for mild pain, fever or headache.    . Biotin 1 MG CAPS Take 1 mg by mouth daily.     . carboxymethylcellulose (REFRESH PLUS) 0.5 % SOLN Place 1 drop into both eyes 3 (  three) times daily as needed (for dry eyes.).    . Cholecalciferol (VITAMIN D3 SUPER STRENGTH) 50 MCG (2000 UT) TABS Take 2,000 Units by mouth daily.    . HYDROmorphone (DILAUDID) 2 MG tablet TAKE 1 TABLET BY MOUTH EVERY 4 HOURS AS NEEDED FOR SEVERE PAIN 60 tablet 0  . lidocaine-prilocaine (EMLA) cream Apply 1 application topically as needed. Port access    . losartan (COZAAR) 50 MG tablet Take 1 tablet by mouth daily.    . metoprolol tartrate (LOPRESSOR) 25 MG tablet Take 1 tablet (25 mg total) by mouth 2 (two)  times daily. 60 tablet 3  . ondansetron (ZOFRAN) 8 MG tablet TAKE 1 TABLET (8 MG TOTAL) BY MOUTH EVERY 8 (EIGHT) HOURS AS NEEDED FOR REFRACTORY NAUSEA / VOMITING. START ON DAY 3 AFTER CHEMO. (Patient taking differently: Take 8 mg by mouth every 8 (eight) hours as needed for nausea or vomiting. ) 30 tablet 1  . polyethylene glycol (MIRALAX / GLYCOLAX) 17 g packet Take 17 g by mouth daily. 14 each 0  . prochlorperazine (COMPAZINE) 10 MG tablet Take 1 tablet (10 mg total) by mouth every 6 (six) hours as needed (Nausea or vomiting). 30 tablet 1  . senna-docusate (SENOKOT-S) 8.6-50 MG tablet Take 2 tablets by mouth 2 (two) times daily. 90 tablet 3   No current facility-administered medications for this visit.    PHYSICAL EXAMINATION: ECOG PERFORMANCE STATUS: 1 - Symptomatic but completely ambulatory  Vitals:   09/16/20 1050  BP: (!) 163/71  Pulse: 63  Resp: 18  Temp: 97.8 F (36.6 C)  SpO2: 98%   Filed Weights   09/16/20 1050  Weight: 152 lb 9.6 oz (69.2 kg)    GENERAL:alert, no distress and comfortable.  She appears anxious Musculoskeletal:no cyanosis of digits and no clubbing  NEURO: alert & oriented x 3 with fluent speech, no focal motor/sensory deficits  LABORATORY DATA:  I have reviewed the data as listed    Component Value Date/Time   NA 142 09/16/2020 1038   K 4.0 09/16/2020 1038   CL 107 09/16/2020 1038   CO2 25 09/16/2020 1038   GLUCOSE 118 (H) 09/16/2020 1038   BUN 17 09/16/2020 1038   CREATININE 0.74 09/16/2020 1038   CALCIUM 9.0 09/16/2020 1038   PROT 6.5 09/16/2020 1038   ALBUMIN 3.6 09/16/2020 1038   AST 18 09/16/2020 1038   ALT 12 09/16/2020 1038   ALKPHOS 55 09/16/2020 1038   BILITOT 0.6 09/16/2020 1038   GFRNONAA >60 09/16/2020 1038   GFRAA >60 01/17/2020 1339    No results found for: SPEP, UPEP  Lab Results  Component Value Date   WBC 8.4 09/16/2020   NEUTROABS 5.0 09/16/2020   HGB 12.7 09/16/2020   HCT 37.6 09/16/2020   MCV 91.5 09/16/2020    PLT 131 (L) 09/16/2020      Chemistry      Component Value Date/Time   NA 142 09/16/2020 1038   K 4.0 09/16/2020 1038   CL 107 09/16/2020 1038   CO2 25 09/16/2020 1038   BUN 17 09/16/2020 1038   CREATININE 0.74 09/16/2020 1038      Component Value Date/Time   CALCIUM 9.0 09/16/2020 1038   ALKPHOS 55 09/16/2020 1038   AST 18 09/16/2020 1038   ALT 12 09/16/2020 1038   BILITOT 0.6 09/16/2020 1038       RADIOGRAPHIC STUDIES: I have reviewed her prior CT imaging which showed degenerative arthritis I have personally reviewed the radiological images as   listed and agreed with the findings in the report.

## 2020-09-19 ENCOUNTER — Telehealth: Payer: Self-pay | Admitting: Oncology

## 2020-09-19 NOTE — Telephone Encounter (Signed)
Misty Hopkins asked about her CA 125 results.  Advised her they are about the same at 73.0.

## 2020-09-24 ENCOUNTER — Encounter (HOSPITAL_COMMUNITY): Payer: Self-pay

## 2020-09-24 ENCOUNTER — Ambulatory Visit (HOSPITAL_COMMUNITY)
Admission: RE | Admit: 2020-09-24 | Discharge: 2020-09-24 | Disposition: A | Discharge status: Home / Self Care | Payer: Medicare HMO | Source: Ambulatory Visit | Attending: Hematology and Oncology | Admitting: Hematology and Oncology

## 2020-09-24 ENCOUNTER — Other Ambulatory Visit: Payer: Self-pay

## 2020-09-24 DIAGNOSIS — C5701 Malignant neoplasm of right fallopian tube: Secondary | ICD-10-CM | POA: Insufficient documentation

## 2020-09-26 ENCOUNTER — Encounter: Payer: Self-pay | Admitting: Hematology and Oncology

## 2020-09-26 ENCOUNTER — Inpatient Hospital Stay: Payer: Medicare HMO | Attending: Hematology and Oncology | Admitting: Hematology and Oncology

## 2020-09-26 ENCOUNTER — Other Ambulatory Visit: Payer: Self-pay

## 2020-09-26 DIAGNOSIS — C5701 Malignant neoplasm of right fallopian tube: Secondary | ICD-10-CM | POA: Diagnosis not present

## 2020-09-26 DIAGNOSIS — M549 Dorsalgia, unspecified: Secondary | ICD-10-CM | POA: Insufficient documentation

## 2020-09-26 DIAGNOSIS — K5730 Diverticulosis of large intestine without perforation or abscess without bleeding: Secondary | ICD-10-CM | POA: Diagnosis not present

## 2020-09-26 DIAGNOSIS — Z9221 Personal history of antineoplastic chemotherapy: Secondary | ICD-10-CM | POA: Diagnosis not present

## 2020-09-26 DIAGNOSIS — I10 Essential (primary) hypertension: Secondary | ICD-10-CM

## 2020-09-26 DIAGNOSIS — G8929 Other chronic pain: Secondary | ICD-10-CM | POA: Diagnosis not present

## 2020-09-26 DIAGNOSIS — M545 Low back pain, unspecified: Secondary | ICD-10-CM | POA: Diagnosis not present

## 2020-09-26 DIAGNOSIS — Z7189 Other specified counseling: Secondary | ICD-10-CM

## 2020-09-26 NOTE — Assessment & Plan Note (Signed)
She has significant diverticulosis in her colon and constipation that has resolved since CT imaging We discussed the importance of aggressive laxative therapy

## 2020-09-26 NOTE — Assessment & Plan Note (Signed)
Majority of her symptoms of pain has improved since her last visit Her blood pressure control is slightly better I have reviewed her recent blood work and CT imaging with the patient and her husband She has no signs of active disease We discussed the risk and benefits of taking a treatment break, resume bevacizumab at a lower dose or switch her to PARP inhibitor The patient thought that she tolerated treatment well I reminded the patient of her last visit of which at that point in time, she was in severe distress with diffuse pain Overall, I do not believe she tolerated treatment very well She did have poorly controlled hypertension that cause other symptoms and her back pain is completely unrelated to her disease but it is inevitable that she could have similar recurrent symptoms again if we were to treat her again with bevacizumab even at lower dose After a lot of discussion, she is in agreement to take a treatment break I plan to see her again in 6 weeks for further follow-up

## 2020-09-26 NOTE — Assessment & Plan Note (Signed)
She has diffuse chronic back pain I reviewed imaging studies with her I told her her back pain is unrelated to the treatment of side effects of treatment It is likely that this is due to degenerative joint disease that is not fixable I recommend referral to orthopedic surgeon She will continue her pain medicine for now

## 2020-09-26 NOTE — Progress Notes (Signed)
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[facility]) 60 of her symptoms of pain has improved since her last visit Her blood pressure control is slightly better I have reviewed her recent blood work and CT imaging with the patient and her husband She has no signs of active disease We discussed the risk and benefits of taking a treatment break, resume bevacizumab at a lower dose or switch her to PARP inhibitor The patient thought that she tolerated treatment well I reminded the patient of her last visit of which at that [facility] in time, she was in severe distress with diffuse pain Overall, I do not believe she tolerated treatment very well She did have poorly controlled hypertension that cause other symptoms and her back pain is completely unrelated to her disease but it is inevitable that she could have similar recurrent symptoms again if we were to treat her again with bevacizumab even at lower dose After a lot of discussion, she is in agreement to take a treatment break I plan to see her again in 6 weeks for further follow-up  Diverticulosis of colon She has significant diverticulosis in her colon and constipation that has resolved since CT imaging We discussed the importance of aggressive laxative therapy  Essential hypertension She has poorly controlled hypertension due to recent treatment She will continue close monitoring of blood pressure and she will continue her current prescribed blood pressure medications We will call her again in about 10 days for further follow-up  Lower back pain She has diffuse chronic back pain I reviewed imaging studies with her I told her her back pain is unrelated to the treatment of side effects of treatment It is likely that this is due to  degenerative joint disease that is not fixable I recommend referral to orthopedic surgeon She will continue her pain medicine for now  Goals of care, counseling/discussion We discussed goals of care treatment I found that the patient have difficulties understanding the treatment goals I also believe she had great anxiety that contributed to some of the problems that we have encountered over the last 6 months At this [facility] in time, I recommend treatment break overall She is in agreement   No orders of the defined types were placed in this encounter.   All questions were answered. The patient knows to call the clinic with any problems, questions or concerns. The total time spent in the appointment was 40 minutes encounter with patients including review of chart and various tests results, discussions about plan of care and coordination of care plan   [name], MD 09/26/2020 3:12 PM  INTERVAL HISTORY: Please see below for problem oriented charting. She returns with her husband for further follow-up I have reviewed her documented blood pressure over the past 2 weeks They were fluctuating up and down but overall slightly improved She had recent constipation that resolved after recent CT imaging She continues to have intermittent back pain  SUMMARY OF ONCOLOGIC HISTORY: Oncology History Overview Note  High grade serous, right fallopian tube  HRD, BRCA tumor testing were neg   Ovarian cancer ([place])  03/21/2018 Initial Diagnosis   Ovarian cancer ([place])   02/04/2020 -  Chemotherapy   The patient had carboplatin and doxil for chemotherapy treatment.     Fallopian tube cancer, carcinoma, right ([place])  10/24/2017 Initial Diagnosis   She has presentation of vague abdominal pain   02/20/2018 Imaging   Outside CT abdomen and pelvis: This revealed an incidental finding of a 1.5 cm subcapsular lesion in the lateral upper pole of the left kidney which measured higher than fluid attenuation  which could represent a proteinaceous renal cyst or solid renal mass.  There was no ascites.  There is no lymphadenopathy.  There were no masses in the pelvis including ovarian or adnexal masses seen.  There was however soft tissue stranding seen with nodularity in the omental fat in the lower abdomen without evidence of ascites.  This was felt to represent either carcinomatosis or inflammatory infectious etiologies.  There was colonic diverticulosis seen without radiographic evidence of diverticulitis.  The radiologist recommended MRI of the abdomen to further work-up the renal mass   02/22/2018 Tumor Marker   Patient's tumor was tested for the following markers: CA-125. Results of the tumor marker test revealed 103.   03/06/2018 Pathology Results   Omentum, biopsy - ADENOCARCINOMA WITH PSAMMOMA BODIES, SEE COMMENT. Microscopic Comment There are scattered small foci of malignant glands with associated psammoma bodies. While the tumor is somewhat limited the cells have a more low grade appearance. There is prominent lymphovascular invasion. Immunohistochemistry reveals the cells are positive for cytokeratin 7, ER, MOC31, PAX8, and WT-1. They are negative for calretinin, cytokeratin 20, CDX-2, and TTF-1. Overall, the findings are consistent with adenocarcinoma. The differential includes a primary gynecologic or peritoneal tumor, although the morphology is not typical of a high grade serous carcinoma.   03/06/2018 Surgery   Surgeon: Donaciano Eva   Pre-operative Diagnosis: omental mass, elevated CA 125 Operation: Laparoscopic omentectomy  Surgeon: Donaciano Eva  Operative Findings:  : normal diaphragm and upper omentum. Sigmoid colon densely adherent to left pelvic side wall consistent with history of diverticulitis. Omentum adherent to anterior abdominal wall and sigmoid colon. Surgically absent uterus. Right ovary very small and grossly normal. Left ovary obscured by adhesed  sigmoid colon. No ascites. No carcinomatosis. There was a nodular firm distal omentum on the left which was adherent to the sigmoid colon and most consistent with inflammatory change.      03/21/2018 Pathology Results   1. Soft tissue mass, simple excision, midline abdominal wall mass - METASTATIC SEROUS CARCINOMA. 2. Omentum, resection for tumor - METASTATIC SEROUS CARCINOMA. 3. Adnexa - ovary +/- tube, neoplastic, right - RIGHT FALLOPIAN TUBE: -HIGH GRADE SEROUS CARCINOMA. -SEE ONCOLOGY TABLE. - RIGHT OVARY: SURFACE PSAMMOMA BODIES. NO DEFINITIVE MALIGNANT CELLS. -INCLUSION CYSTS. 4. Adnexa - ovary +/- tube, neoplastic, left - LEFT FALLOPIAN TUBE: LUMINAL AND SURFACE SEROUS CARCINOMA. - LEFT OVARY: FOCAL PSAMMOMA BODIES AND MALIGNANT CELLS. 5. Soft tissue mass, simple excision, left abdominal wall - METASTATIC SEROUS CARCINOMA. Microscopic Comment 3. OVARY or FALLOPIAN TUBE or PRIMARY PERITONEUM: Procedure: Bilateral salpingo-oophorectomy. Abdominal wall mass excision and omentectomy. Specimen Integrity: Intact. Tumor Site: Right fallopian tube. Ovarian Surface Involvement (required only if applicable): Present (left). Fallopian Tube Surface Involvement (required only if applicable): Present. Tumor Size: 1.5 cm. Histologic Type: High grade serous carcinoma. Histologic Grade: High grade. Implants (required for advanced stage serous/seromucinous borderline tumors only): Present. Other Tissue/ Organ Involvement: Omentum, abdominal wall, left fallopian tube and ovary. Largest Extrapelvic Peritoneal Focus (required only if applicable): >2 cm. Peritoneal/Ascitic Fluid: N/A. Treatment Effect (required only for high-grade serous carcinomas): N/A. Regional Lymph Nodes: No lymph nodes submitted or found Pathologic Stage Classification (pTNM, AJCC 8th Edition): pT3c, pNX Representative Tumor  Block: 3A Comment(s): None.   03/21/2018 Surgery   Preoperative Diagnosis: stage IIIC  primary peritoneal vs ovarian cancer   Procedure(s) Performed:  Exploratory laparotomy with bilateral salpingo-oophorectomy, omentectomy radical tumor debulking for ovarian cancer, ventral hernia repair.   Surgeon: Thereasa Solo, MD. Specimens: Bilateral tubes / ovaries, omentum. Midline abdominal wall mass, left lateral abdominal wall mass.    Operative Findings:  Abdominal wall masses consistent with port site metastases at the umbilical incision (7cm) and left lateral incision (5cm). Omental caking in the infracolic omentum. Grossly normal very small tubes and ovaries. Normal diaphragms. No lymphadenopathy. Normal small and large intestine with the exception of miliary studding of tumor on the surface of the sigmoid colon and rectum in the cul de sac.    This represented an optimal cytoreduction (R1) with no gross visible disease remaining, however there was a thin rind of tumor on the lateral left fascia associated with the port site metastasis.     04/11/2018 Cancer Staging   Staging form: Ovary, Fallopian Tube, and Primary Peritoneal Carcinoma, AJCC 8th Edition - Pathologic: FIGO Stage IIIC (pT3, pN0, cM0) - Signed by [name], MD on 04/11/2018   05/23/2018 Imaging   1. Interval resolution of the anterior midline abdominal wall seroma. There is some persistent wispy soft tissue attenuation in the region of the midline incision, nonspecific and may simply reflect granulation tissue from surgery. 2. Stable 13 mm exophytic lesion posterior left kidney with attenuation higher than expected for a simple cyst. This may be a cyst complicated by proteinaceous debris or hemorrhage, but attention on follow-up recommended. 3. Stable mild persistent distention of proximal jejunal loops with gradual tapering to nondilated distal small bowel. 4. No overt peritoneal or mesenteric disease identified on this exam.   05/23/2018 Tumor Marker   Patient's tumor was tested for the following markers:  CA-125 Results of the tumor marker test revealed 11.6   06/02/2018 - 09/20/2018 Chemotherapy   The patient had carboplatin and taxol   06/19/2018 Procedure   Status post right IJ port catheter placement. Catheter ready for use.   06/26/2018 Tumor Marker   Patient's tumor was tested for the following markers: CA-125. Results of the tumor marker test revealed 10.7   09/20/2018 Tumor Marker   Patient's tumor was tested for the following markers: CA-125. Results of the tumor marker test revealed 9.1   10/25/2018 Imaging   1. There is suspicious, abnormal asymmetric increased soft tissue involving the cecum and ascending colon. This is suspicious for residual/progressive serosal involvement by tumor. 2. No additional sites of disease identified. No evidence for nodal metastasis, solid organ metastasis or ascites.     11/07/2018 Procedure   She had negative colonoscopy evaluation   12/05/2018 Tumor Marker   Patient's tumor was tested for the following markers: CA-125 Results of the tumor marker test revealed 8.6   01/25/2019 Imaging   Ct abdomen and pelvis Signs of omentectomy and hysterectomy without definitive signs of residual recurrent disease. Small nodular focus bridging rectum and vagina is stable dating back January to 12/14/2018, potentially postoperative but suggest attention on subsequent imaging.   01/25/2019 Tumor Marker   Patient's tumor was tested for the following markers: CA-125 Results of the tumor marker test revealed 10.3   03/15/2019 Tumor Marker   Patient's tumor was tested for the following markers: CA-125 Results of the tumor marker test revealed 9.6   03/23/2019 Imaging   No acute findings in the abdomen or pelvis.   Colonic  diverticulosis.  No active diverticulitis.   Prior open tectum E and hysterectomy.   Aortic atherosclerosis.     05/10/2019 Tumor Marker   Patient's tumor was tested for the following markers: CA-125 Results of the tumor marker test  revealed 11.8   05/22/2019 Imaging   1. Stable exam. No evidence of recurrent or metastatic carcinoma within the abdomen or pelvis. 2. Colonic diverticulosis, without radiographic evidence of diverticulitis.     11/08/2019 Imaging   1. Status post hysterectomy and bilateral oophorectomy. 2. Soft tissue thickening within the deep pelvis which when compared to multiple prior exams is felt to be slowly progressive. Especially given the elevated CA 125 level, this is suspicious for peritoneal recurrence. Potential imaging strategies include PET (which may be of low sensitivity secondary to the lack of well-defined dominant mass) or pelvic CT follow-up at 3 months. 3. Otherwise, no evidence of metastatic disease within the chest, abdomen, or pelvis. 4. Coronary artery atherosclerosis. Aortic Atherosclerosis (ICD10-I70.0). 5. Ventral abdominal wall hernia containing nonobstructive transverse colon, as before. 6.  Possible constipation.   12/21/2019 Tumor Marker   Patient's tumor was tested for the following markers: 95.2. Results of the tumor marker test revealed CA-`125.   01/17/2020 Imaging   1. Similar appearance of nodularity in the central pelvis along the vaginal cuff. There is a particular nodular soft tissue density measuring 2.2 x 1.6 cm along the wall of the proximal rectum that is slightly more conspicuous than previously, raising concern for possible minimal progression. Similar appearance of the other areas of nodular soft tissue in the central pelvis. PET-CT may prove helpful to further evaluate. 2. No ascites. 3. Left colonic diverticulosis without diverticulitis. 4. 1.7 cm exophytic lesion upper pole left kidney with well-defined homogeneous attenuation slightly higher than would be expected for simple fluid. This is probably a complex cyst. Attention on follow-up recommended. 5. Aortic Atherosclerosis (ICD10-I70.0).   01/17/2020 Tumor Marker   Patient's tumor was tested for the  following markers: CA-125 Results of the tumor marker test revealed 135   01/30/2020 Echocardiogram   1. Left ventricular ejection fraction, by estimation, is 55 to 60%. Left ventricular ejection fraction by PLAX is 55 %. The left ventricle has normal function. The left ventricle has no regional wall motion abnormalities. Left ventricular diastolic parameters are indeterminate.  2. Right ventricular systolic function is normal. The right ventricular size is normal.  3. Left atrial size was borderline dilated.  4. The mitral valve is grossly normal. Trivial mitral valve regurgitation.  5. The aortic valve was not well visualized. Aortic valve regurgitation is trivial.  6. The inferior vena cava is normal in size with greater than 50% respiratory variability, suggesting right atrial pressure of 3 mmHg.   02/04/2020 -  Chemotherapy   The patient had carboplatin and doxil for chemotherapy treatment.     03/03/2020 Tumor Marker   Patient's tumor was tested for the following markers: CA-125 Results of the tumor marker test revealed 272   03/07/2020 Procedure   1. Aspiration of left lower quadrant fluid collection yields 5 mL simple straw-colored serous fluid. 2. As the fluid appears to be simple in nature, no drainage catheter was placed. 3. The aspirated fluid was sent 4 body fluid culture to confirm its sterility.   03/07/2020 Imaging   VQ scan No perfusion defects evident. No findings indicative of pulmonary embolus.   03/07/2020 Imaging   Outside CT Multiple serosal implants again noted within the pelvis. However, increasing peritoneal nodularity and  peritoneal thickening noted within the flanks and right upper quadrant as well as developing trace ascites within the mesentery all suspicious for progressive metastatic disease. Correlation with tumor markers may be helpful for further management.   Interval development of a 3.2 cm fluid collection within the left lower quadrant. Given its  relatively rapid development, and alternative etiology such as a diverticular abscess should be considered. Recurrent disease, particular given the patient's history of serous adenocarcinoma, is not definitively excluded   03/31/2020 Tumor Marker   Patient's tumor was tested for the following markers: CA-125. Results of the tumor marker test revealed 129   04/29/2020 Tumor Marker   Patient's tumor was tested for the following markers: CA-125 Results of the tumor marker test revealed 66.5   05/22/2020 Echocardiogram   1. GLS -19.9%.  2. Left ventricular ejection fraction, by estimation, is 55 to 60%. The left ventricle has normal function. The left ventricle has no regional wall motion abnormalities. Left ventricular diastolic parameters were normal.  3. Right ventricular systolic function is normal. The right ventricular size is normal.  4. The mitral valve is normal in structure. Trivial mitral valve regurgitation. No evidence of mitral stenosis.  5. The aortic valve is tricuspid. Aortic valve regurgitation is not visualized. No aortic stenosis is present.  6. The inferior vena cava is normal in size with greater than 50% respiratory variability, suggesting right atrial pressure of 3 mmHg.   05/27/2020 Tumor Marker   Patient's tumor was tested for the following markers: CA-125 Results of the tumor marker test revealed 61.2   06/10/2020 Imaging   1. Stable exam. No substantial change in the scattered areas of soft tissue thickening in the central pelvis. 2. Interval resolution of the small fluid collection seen in the anterior left pelvis adjacent to small bowel on the previous study. 3. No evidence for new metastatic disease in the chest, abdomen, or pelvis. 4. Diffuse colonic diverticulosis without diverticulitis. 5. Aortic Atherosclerosis (ICD10-I70.0).   07/15/2020 Tumor Marker   Patient's tumor was tested for the following markers: CA-125 Results of the tumor marker test revealed 35.3    08/06/2020 Tumor Marker   Patient's tumor was tested for the following markers: CA-125 Results of the tumor marker test revealed 26.1.   09/16/2020 Tumor Marker   Patient's tumor was tested for the following markers: CA-125 Results of the tumor marker test revealed 26   09/24/2020 Imaging   1. Stable exam.  No new or progressive interval findings. 2. Areas of scattered soft tissue thickening in the central and right pelvis are stable in the interval. 3. Diffuse colonic diverticulosis with moderate stool volume. Imaging features could be compatible with clinical constipation. 4.  Aortic Atherosclerois (ICD10-170.0)       REVIEW OF SYSTEMS:   Constitutional: Denies fevers, chills or abnormal weight loss Eyes: Denies blurriness of vision Ears, nose, mouth, throat, and face: Denies mucositis or sore throat Respiratory: Denies cough, dyspnea or wheezes Cardiovascular: Denies palpitation, chest discomfort or lower extremity swelling  Skin: Denies abnormal skin rashes Lymphatics: Denies new lymphadenopathy or easy bruising Neurological:Denies numbness, tingling or new weaknesses Behavioral/Psych: Mood is stable, no new changes  All other systems were reviewed with the patient and are negative.  I have reviewed the past medical history, past surgical history, social history and family history with the patient and they are unchanged from previous note.  ALLERGIES:  is allergic to ivp dye [iodinated diagnostic agents], propoxyphene, codeine, and ultram [tramadol hcl].  MEDICATIONS:  Current  Outpatient Medications  Medication Sig Dispense Refill  . acetaminophen (TYLENOL) 325 MG tablet Take 650 mg by mouth every 6 (six) hours as needed for mild pain, fever or headache.    . Biotin 1 MG CAPS Take 1 mg by mouth daily.     . carboxymethylcellulose (REFRESH PLUS) 0.5 % SOLN Place 1 drop into both eyes 3 (three) times daily as needed (for dry eyes.).    Marland Kitchen Cholecalciferol (VITAMIN D3 SUPER  STRENGTH) 50 MCG (2000 UT) TABS Take 2,000 Units by mouth daily.    Marland Kitchen HYDROmorphone (DILAUDID) 2 MG tablet TAKE 1 TABLET BY MOUTH EVERY 4 HOURS AS NEEDED FOR SEVERE PAIN 60 tablet 0  . lidocaine-prilocaine (EMLA) cream Apply 1 application topically as needed. Port access    . losartan (COZAAR) 50 MG tablet Take 1 tablet by mouth daily.    . metoprolol tartrate (LOPRESSOR) 25 MG tablet Take 1 tablet (25 mg total) by mouth 2 (two) times daily. 60 tablet 3  . ondansetron (ZOFRAN) 8 MG tablet TAKE 1 TABLET (8 MG TOTAL) BY MOUTH EVERY 8 (EIGHT) HOURS AS NEEDED FOR REFRACTORY NAUSEA / VOMITING. START ON DAY 3 AFTER CHEMO. (Patient taking differently: Take 8 mg by mouth every 8 (eight) hours as needed for nausea or vomiting. ) 30 tablet 1  . polyethylene glycol (MIRALAX / GLYCOLAX) 17 g packet Take 17 g by mouth daily. 14 each 0  . prochlorperazine (COMPAZINE) 10 MG tablet Take 1 tablet (10 mg total) by mouth every 6 (six) hours as needed (Nausea or vomiting). 30 tablet 1  . senna-docusate (SENOKOT-S) 8.6-50 MG tablet Take 2 tablets by mouth 2 (two) times daily. 90 tablet 3   No current facility-administered medications for this visit.    PHYSICAL EXAMINATION: ECOG PERFORMANCE STATUS: 1 - Symptomatic but completely ambulatory  Vitals:   09/26/20 1338  BP: (!) 150/83  Pulse: 68  Resp: 18  Temp: (!) 97.4 F (36.3 C)  SpO2: 98%   Filed Weights   09/26/20 1338  Weight: 155 lb 9.6 oz (70.6 kg)    GENERAL:alert, no distress and comfortable NEURO: alert & oriented x 3 with fluent speech, no focal motor/sensory deficits  LABORATORY DATA:  I have reviewed the data as listed    Component Value Date/Time   NA 142 09/16/2020 1038   K 4.0 09/16/2020 1038   CL 107 09/16/2020 1038   CO2 25 09/16/2020 1038   GLUCOSE 118 (H) 09/16/2020 1038   BUN 17 09/16/2020 1038   CREATININE 0.74 09/16/2020 1038   CALCIUM 9.0 09/16/2020 1038   PROT 6.5 09/16/2020 1038   ALBUMIN 3.6 09/16/2020 1038   AST 18  09/16/2020 1038   ALT 12 09/16/2020 1038   ALKPHOS 55 09/16/2020 1038   BILITOT 0.6 09/16/2020 1038   GFRNONAA >60 09/16/2020 1038   GFRAA >60 01/17/2020 1339    No results found for: SPEP, UPEP  Lab Results  Component Value Date   WBC 8.4 09/16/2020   NEUTROABS 5.0 09/16/2020   HGB 12.7 09/16/2020   HCT 37.6 09/16/2020   MCV 91.5 09/16/2020   PLT 131 (L) 09/16/2020      Chemistry      Component Value Date/Time   NA 142 09/16/2020 1038   K 4.0 09/16/2020 1038   CL 107 09/16/2020 1038   CO2 25 09/16/2020 1038   BUN 17 09/16/2020 1038   CREATININE 0.74 09/16/2020 1038      Component Value Date/Time   CALCIUM 9.0 09/16/2020  1038   ALKPHOS 55 09/16/2020 1038   AST 18 09/16/2020 1038   ALT 12 09/16/2020 1038   BILITOT 0.6 09/16/2020 1038       RADIOGRAPHIC STUDIES: I have reviewed multiple imaging studies with the patient and her husband I have personally reviewed the radiological images as listed and agreed with the findings in the report. CT Abdomen Pelvis Wo Contrast  Result Date: 09/24/2020 CLINICAL DATA:  Ovarian cancer.  Restaging. EXAM: CT ABDOMEN AND PELVIS WITHOUT CONTRAST TECHNIQUE: Multidetector CT imaging of the abdomen and pelvis was performed following the standard protocol without IV contrast. COMPARISON:  06/10/2020 FINDINGS: Lower chest: Unremarkable Hepatobiliary: No focal abnormality in the liver on this study without intravenous contrast. There is no evidence for gallstones, gallbladder wall thickening, or pericholecystic fluid. No intrahepatic or extrahepatic biliary dilation. Pancreas: No focal mass lesion. No dilatation of the main duct. No intraparenchymal cyst. No peripancreatic edema. Spleen: No splenomegaly. No focal mass lesion. Adrenals/Urinary Tract: No adrenal nodule or mass. Right kidney unremarkable. No substantial change 18 mm exophytic lesion upper pole left kidney compatible with a cyst. No evidence for hydroureter. The urinary bladder appears  normal for the degree of distention. Stomach/Bowel: Stomach is unremarkable. No gastric wall thickening. No evidence of outlet obstruction. Duodenum is normally positioned as is the ligament of Treitz. No small bowel wall thickening. No small bowel dilatation. Prominent stool volume noted. Left colonic diverticulosis without diverticulitis. Vascular/Lymphatic: There is abdominal aortic atherosclerosis without aneurysm. There is no gastrohepatic or hepatoduodenal ligament lymphadenopathy. No retroperitoneal or mesenteric lymphadenopathy. No pelvic sidewall lymphadenopathy. Reproductive: Uterus surgically absent.  There is no adnexal mass. Other: No intraperitoneal free fluid. Areas of soft tissue thickening along the rectum (image 75/2) and right pelvic sidewall/cecum (71/2) are stable in the interval. No new suspicious finding to suggest disease progression. Musculoskeletal: No worrisome lytic or sclerotic osseous abnormality.Midline ventral hernia is stable containing fat. IMPRESSION: 1. Stable exam.  No new or progressive interval findings. 2. Areas of scattered soft tissue thickening in the central and right pelvis are stable in the interval. 3. Diffuse colonic diverticulosis with moderate stool volume. Imaging features could be compatible with clinical constipation. 4.  Aortic Atherosclerois (ICD10-170.0) Electronically Signed   By: Misty Stanley M.D.   On: 09/24/2020 10:36

## 2020-09-26 NOTE — Assessment & Plan Note (Signed)
She has poorly controlled hypertension due to recent treatment She will continue close monitoring of blood pressure and she will continue her current prescribed blood pressure medications We will call her again in about 10 days for further follow-up

## 2020-09-26 NOTE — Assessment & Plan Note (Signed)
We discussed goals of care treatment I found that the patient have difficulties understanding the treatment goals I also believe she had great anxiety that contributed to some of the problems that we have encountered over the last 6 months At this point in time, I recommend treatment break overall She is in agreement

## 2020-09-30 ENCOUNTER — Telehealth: Payer: Self-pay | Admitting: Oncology

## 2020-09-30 NOTE — Telephone Encounter (Signed)
Junette called and confirmed her appointments for 11/10/20.

## 2020-10-01 ENCOUNTER — Telehealth: Payer: Self-pay | Admitting: Oncology

## 2020-10-01 NOTE — Telephone Encounter (Signed)
Misty Hopkins called concerned because she saw a story on the news about the CT contrast shortage.  Went over her CT Scan results from 09/24/2020 and advised her that Dr. Alvy Bimler would let her know if she needed the contrast.  She verbalized understanding and felt better after reviewing the results again.

## 2020-10-02 ENCOUNTER — Telehealth: Payer: Self-pay | Admitting: Oncology

## 2020-10-02 NOTE — Telephone Encounter (Signed)
Called Misty Hopkins back and advised her of message below from Dr. Alvy Bimler.  She was able to verbalize in teach back mode to take metoprolol BID and losartan BID.  She also mentioned her bp has gone down to 155/94.

## 2020-10-02 NOTE — Telephone Encounter (Signed)
Misty Hopkins called and said her BP this morning was 142/81.  She had an episode of nausea and had a nose bleed about 30 minutes ago and retook her BP which was 179/109 and 181/106 10 minutes later.    She had taken 1 tablet of losartan 50 mg at 5:30 and decided to take another tablet when her BP was so high.  She has been taking Losartan 1 tablet in the am and 1 tablet at bedtime.  She has not been taking metoprolol.  She is confused on what she should be taking.  She also mentioned that she took 1 tablet of doxycyline last night and one this morning on an empty stomach for rosacea and is wondering if this made her BP go up and/or caused the nausea.

## 2020-10-02 NOTE — Telephone Encounter (Signed)
Doxycyline can cause nausea She needs to take metoprolol BID and losartan BID

## 2020-10-06 ENCOUNTER — Telehealth: Payer: Self-pay

## 2020-10-06 NOTE — Telephone Encounter (Signed)
Called back. She has been gone all day. Back pain is better after the steroid injection last week.  Bp readings 6/9 146/84 pm 6/10 bp am 164/86/ did not check pm 6/11 bp am 148/104 and pm 153/84 6/12 bp am 148/88 and pm 153/83 6/13 bp am 153/87  She is complaining of voice hoarseness. Started 5-6 weeks ago. Denies fever and sore throat. Nasal drainage at times and throat drainage at times. She is gargling with warm salt walter. Ask her to take a home covid test. She does not have any covid test and does not want to take one. Encourage to rest voice.

## 2020-10-06 NOTE — Telephone Encounter (Signed)
Called and left a message asking her to call the office back. 

## 2020-10-06 NOTE — Telephone Encounter (Signed)
-----   Message from [name], MD sent at 10/06/2020  7:31 AM EDT ----- Can you call and check on how she is doing? Her back pain and BP

## 2020-10-07 ENCOUNTER — Telehealth: Payer: Self-pay

## 2020-10-07 ENCOUNTER — Encounter: Payer: Self-pay | Admitting: Hematology and Oncology

## 2020-10-07 NOTE — Telephone Encounter (Signed)
I will not adjust her BP medications further She should see her PCP for voice hoarseness; this is not related to her cancer or treatment

## 2020-10-07 NOTE — Telephone Encounter (Signed)
Called and given below message. She verbalized understanding. 

## 2020-10-07 NOTE — Telephone Encounter (Signed)
She called and left a message to call her.  Called back and spoke with husband, He will have Leilyn call when she gets home.

## 2020-10-07 NOTE — Telephone Encounter (Signed)
She called and left another message to cal her. Called back. She is unable to get in with PCP for voice hoarseness.  She scheduled appt with ENT for 7/10 and will go to urgent care for worsening symptoms.

## 2020-10-10 ENCOUNTER — Telehealth: Payer: Self-pay | Admitting: Hematology and Oncology

## 2020-10-10 NOTE — Telephone Encounter (Signed)
Sch per 6/6 sch msg, mailed calendar

## 2020-10-22 ENCOUNTER — Telehealth: Payer: Self-pay | Admitting: Oncology

## 2020-10-22 NOTE — Telephone Encounter (Signed)
Left a message advising of message from Dr. Alvy Bimler.

## 2020-10-22 NOTE — Telephone Encounter (Signed)
Shante called and said she received an envelope with nothing in it from [facility].  She is wondering if we know what it was supposed to be.  Advised her that the only thing in her chart that was sent was a callander with her upcoming appointments that was sent by scheduling.  She said she did received that and is aware of her appointments on 11/10/20.

## 2020-10-22 NOTE — Telephone Encounter (Signed)
It will take a long time to get referral here I suggest she proceed with Ct as recommended by ENT

## 2020-10-22 NOTE — Telephone Encounter (Signed)
Salayah called and said she saw Dr. Mattie Marlin this morning. He is an ENT with [facility] and was referred by PCP, Dr. Jenean Lindau.  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  He is recommending doing a CT scan from the neck up.  Shakerria is wondering what Dr. Calton Dach recommendations are for having another CT scan so close to her last on 09/24/20.  Also she is wondering if Dr. Alvy Bimler can recommend an ENT for a 2nd opinion.

## 2020-10-24 ENCOUNTER — Encounter: Payer: Self-pay | Admitting: Hematology and Oncology

## 2020-10-28 ENCOUNTER — Telehealth: Payer: Self-pay | Admitting: Oncology

## 2020-10-28 NOTE — Telephone Encounter (Signed)
Misty Hopkins called and said she is having trouble getting the CT neck scheduled at [facility].  She said they are waiting for authorization and that they will not be able to give her an appointment until sometime in August.  Advised her to call the ENT's office to see if they can help to expedite the scan.  She also gave her recent blood pressure readings:  10/24/20 129/89 am 141/80 pm 10/25/20 146/83 10/26/20 137/84 am 116/80 pm 10/28/20 137/90 am

## 2020-10-28 NOTE — Telephone Encounter (Signed)
Thanks for the update and BP readings

## 2020-10-31 ENCOUNTER — Encounter: Payer: Self-pay | Admitting: Hematology and Oncology

## 2020-11-01 ENCOUNTER — Other Ambulatory Visit: Payer: Self-pay | Admitting: Hematology and Oncology

## 2020-11-03 ENCOUNTER — Encounter: Payer: Self-pay | Admitting: Hematology and Oncology

## 2020-11-10 ENCOUNTER — Encounter: Payer: Self-pay | Admitting: Hematology and Oncology

## 2020-11-10 ENCOUNTER — Inpatient Hospital Stay: Payer: Medicare HMO

## 2020-11-10 ENCOUNTER — Other Ambulatory Visit: Payer: Self-pay

## 2020-11-10 ENCOUNTER — Other Ambulatory Visit: Payer: Self-pay | Admitting: Hematology and Oncology

## 2020-11-10 ENCOUNTER — Inpatient Hospital Stay: Payer: Medicare HMO | Attending: Hematology and Oncology

## 2020-11-10 ENCOUNTER — Inpatient Hospital Stay (HOSPITAL_BASED_OUTPATIENT_CLINIC_OR_DEPARTMENT_OTHER): Payer: Medicare HMO | Admitting: Hematology and Oncology

## 2020-11-10 DIAGNOSIS — C5701 Malignant neoplasm of right fallopian tube: Secondary | ICD-10-CM

## 2020-11-10 DIAGNOSIS — R252 Cramp and spasm: Secondary | ICD-10-CM

## 2020-11-10 DIAGNOSIS — I10 Essential (primary) hypertension: Secondary | ICD-10-CM | POA: Diagnosis not present

## 2020-11-10 DIAGNOSIS — Z885 Allergy status to narcotic agent status: Secondary | ICD-10-CM | POA: Diagnosis not present

## 2020-11-10 DIAGNOSIS — C562 Malignant neoplasm of left ovary: Secondary | ICD-10-CM | POA: Insufficient documentation

## 2020-11-10 DIAGNOSIS — R634 Abnormal weight loss: Secondary | ICD-10-CM | POA: Insufficient documentation

## 2020-11-10 DIAGNOSIS — Z79899 Other long term (current) drug therapy: Secondary | ICD-10-CM | POA: Insufficient documentation

## 2020-11-10 DIAGNOSIS — Z7189 Other specified counseling: Secondary | ICD-10-CM

## 2020-11-10 DIAGNOSIS — C569 Malignant neoplasm of unspecified ovary: Secondary | ICD-10-CM

## 2020-11-10 LAB — CBC WITH DIFFERENTIAL (CANCER CENTER ONLY)
Abs Immature Granulocytes: 0.06 10*3/uL (ref 0.00–0.07)
Basophils Absolute: 0 10*3/uL (ref 0.0–0.1)
Basophils Relative: 0 %
Eosinophils Absolute: 0.3 10*3/uL (ref 0.0–0.5)
Eosinophils Relative: 3 %
HCT: 39.8 % (ref 36.0–46.0)
Hemoglobin: 13.6 g/dL (ref 12.0–15.0)
Immature Granulocytes: 1 %
Lymphocytes Relative: 22 %
Lymphs Abs: 2.4 10*3/uL (ref 0.7–4.0)
MCH: 29.8 pg (ref 26.0–34.0)
MCHC: 34.2 g/dL (ref 30.0–36.0)
MCV: 87.3 fL (ref 80.0–100.0)
Monocytes Absolute: 1 10*3/uL (ref 0.1–1.0)
Monocytes Relative: 9 %
Neutro Abs: 6.8 10*3/uL (ref 1.7–7.7)
Neutrophils Relative %: 65 %
Platelet Count: 163 10*3/uL (ref 150–400)
RBC: 4.56 MIL/uL (ref 3.87–5.11)
RDW: 13.1 % (ref 11.5–15.5)
WBC Count: 10.6 10*3/uL — ABNORMAL HIGH (ref 4.0–10.5)
nRBC: 0 % (ref 0.0–0.2)

## 2020-11-10 LAB — CMP (CANCER CENTER ONLY)
ALT: 17 U/L (ref 0–44)
AST: 18 U/L (ref 15–41)
Albumin: 3.5 g/dL (ref 3.5–5.0)
Alkaline Phosphatase: 60 U/L (ref 38–126)
Anion gap: 10 (ref 5–15)
BUN: 12 mg/dL (ref 8–23)
CO2: 25 mmol/L (ref 22–32)
Calcium: 9.1 mg/dL (ref 8.9–10.3)
Chloride: 104 mmol/L (ref 98–111)
Creatinine: 0.73 mg/dL (ref 0.44–1.00)
GFR, Estimated: >60 mL/min (ref >60)
Glucose, Bld: 88 mg/dL (ref 70–99)
Potassium: 4 mmol/L (ref 3.5–5.1)
Sodium: 139 mmol/L (ref 135–145)
Total Bilirubin: 0.4 mg/dL (ref 0.3–1.2)
Total Protein: 7 g/dL (ref 6.5–8.1)

## 2020-11-10 LAB — TOTAL PROTEIN, URINE DIPSTICK: Protein, ur: NEGATIVE mg/dL

## 2020-11-10 LAB — MAGNESIUM: Magnesium: 1.7 mg/dL (ref 1.7–2.4)

## 2020-11-10 MED ORDER — HEPARIN SOD (PORK) LOCK FLUSH 100 UNIT/ML IV SOLN
500.0000 [IU] | Freq: Once | INTRAVENOUS | Status: DC
  Filled 2020-11-10: qty 5

## 2020-11-10 MED ORDER — SODIUM CHLORIDE 0.9% FLUSH
10.0000 mL | Freq: Once | INTRAVENOUS | Status: AC
  Administered 2020-11-10: 10 mL
  Filled 2020-11-10: qty 10

## 2020-11-10 MED ORDER — ALTEPLASE 2 MG IJ SOLR
INTRAMUSCULAR | Status: AC
  Filled 2020-11-10: qty 2

## 2020-11-10 MED ORDER — ALTEPLASE 2 MG IJ SOLR
2.0000 mg | Freq: Once | INTRAMUSCULAR | Status: AC
  Administered 2020-11-10: 2 mg
  Filled 2020-11-10: qty 2

## 2020-11-10 NOTE — Assessment & Plan Note (Signed)
Her blood pressure is intermittently elevated, worse today likely due to anxiety The documented blood pressure from home is satisfactory with occasional blood pressure in the 150s but in general were within normal range We will monitor that carefully

## 2020-11-10 NOTE — Assessment & Plan Note (Signed)
She has intermittent muscle cramps Her electrolytes are within normal range I do not believe she needs magnesium replacement therapy She is reassured

## 2020-11-10 NOTE — Assessment & Plan Note (Signed)
She continues to have intermittent abdominal discomfort Her labs are completely normal and her physical examination is normal She is reassured I will see her again in 8 weeks for further follow-up

## 2020-11-10 NOTE — Progress Notes (Signed)
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[facility]) She continues to have intermittent abdominal discomfort Her labs are completely normal and her physical examination is normal She is reassured I will see her again in 8 weeks for further follow-up  Essential hypertension Her blood pressure is intermittently elevated, worse today likely due to anxiety The documented blood pressure from home is satisfactory with occasional blood pressure in the 150s but in general were within normal range We will monitor that carefully  Muscle cramps She has intermittent muscle cramps Her electrolytes are within normal range I do not believe she needs magnesium replacement therapy She is reassured  No orders of the defined types were placed in this encounter.   All questions were answered. The patient knows to call the clinic with any problems, questions or concerns. The total time spent in the appointment was 20 minutes encounter with patients including review of chart and various tests results, discussions about plan of care and coordination of care plan   Misty Lark, MD 11/10/2020 2:24 PM  INTERVAL HISTORY: Please see below for problem oriented charting. She returns for further follow-up with her husband She brought with her documented blood pressure for the last 2 weeks Her blood pressure were within normal range She has occasional intermittent abdominal cramps She have lost some weight but she claims she is eating normal She had some changes in sensation of both hands in her last 2 digits that comes and goes  SUMMARY OF ONCOLOGIC HISTORY: Oncology History Overview Note  High grade serous, right fallopian tube  HRD, BRCA tumor testing were neg    Ovarian cancer ([place])  03/21/2018 Initial  Diagnosis   Ovarian cancer ([place])    02/04/2020 -  Chemotherapy   The patient had carboplatin and doxil for chemotherapy treatment.     Fallopian tube cancer, carcinoma, right ([place])  10/24/2017 Initial Diagnosis   She has presentation of vague abdominal pain    02/20/2018 Imaging   Outside CT abdomen and pelvis: This revealed an incidental finding of a 1.5 cm subcapsular lesion in the lateral upper pole of the left kidney which measured higher than fluid attenuation which could represent a proteinaceous renal cyst or solid renal mass.  There was no ascites.  There is no lymphadenopathy.  There were no masses in the pelvis including ovarian or adnexal masses seen.  There was however soft tissue stranding seen with nodularity in the omental fat in the lower abdomen without evidence of ascites.  This was felt to represent either carcinomatosis or inflammatory infectious etiologies.  There was colonic diverticulosis seen without radiographic evidence of diverticulitis.  The radiologist recommended MRI of the abdomen to further work-up the renal mass    02/22/2018 Tumor Marker   Patient's tumor was tested for the following markers: CA-125. Results of the tumor marker test revealed 103.    03/06/2018 Pathology Results   Omentum, biopsy - ADENOCARCINOMA WITH PSAMMOMA BODIES, SEE COMMENT. Microscopic Comment There are scattered small foci of malignant glands with associated psammoma bodies. While the tumor is somewhat limited the cells have a more low grade appearance. There is prominent lymphovascular invasion. Immunohistochemistry reveals the cells are positive for cytokeratin 7, ER, MOC31, PAX8, and WT-1. They are negative for calretinin, cytokeratin 20, CDX-2, and TTF-1. Overall, the findings are consistent with  adenocarcinoma. The differential includes a primary gynecologic or peritoneal tumor, although the morphology is not typical of a high grade serous carcinoma.    03/06/2018 Surgery    Surgeon: Donaciano Eva    Pre-operative Diagnosis: omental mass, elevated CA 125 Operation: Laparoscopic omentectomy   Surgeon: Donaciano Eva     Operative Findings:  : normal diaphragm and upper omentum. Sigmoid colon densely adherent to left pelvic side wall consistent with history of diverticulitis. Omentum adherent to anterior abdominal wall and sigmoid colon. Surgically absent uterus. Right ovary very small and grossly normal. Left ovary obscured by adhesed sigmoid colon. No ascites. No carcinomatosis. There was a nodular firm distal omentum on the left which was adherent to the sigmoid colon and most consistent with inflammatory change.        03/21/2018 Pathology Results   1. Soft tissue mass, simple excision, midline abdominal wall mass - METASTATIC SEROUS CARCINOMA. 2. Omentum, resection for tumor - METASTATIC SEROUS CARCINOMA. 3. Adnexa - ovary +/- tube, neoplastic, right - RIGHT FALLOPIAN TUBE: -HIGH GRADE SEROUS CARCINOMA. -SEE ONCOLOGY TABLE. - RIGHT OVARY: SURFACE PSAMMOMA BODIES. NO DEFINITIVE MALIGNANT CELLS. -INCLUSION CYSTS. 4. Adnexa - ovary +/- tube, neoplastic, left - LEFT FALLOPIAN TUBE: LUMINAL AND SURFACE SEROUS CARCINOMA. - LEFT OVARY: FOCAL PSAMMOMA BODIES AND MALIGNANT CELLS. 5. Soft tissue mass, simple excision, left abdominal wall - METASTATIC SEROUS CARCINOMA. Microscopic Comment 3. OVARY or FALLOPIAN TUBE or PRIMARY PERITONEUM: Procedure: Bilateral salpingo-oophorectomy. Abdominal wall mass excision and omentectomy. Specimen Integrity: Intact. Tumor Site: Right fallopian tube. Ovarian Surface Involvement (required only if applicable): Present (left). Fallopian Tube Surface Involvement (required only if applicable): Present. Tumor Size: 1.5 cm. Histologic Type: High grade serous carcinoma. Histologic Grade: High grade. Implants (required for advanced stage serous/seromucinous borderline tumors only): Present. Other Tissue/ Organ  Involvement: Omentum, abdominal wall, left fallopian tube and ovary. Largest Extrapelvic Peritoneal Focus (required only if applicable): >2 cm. Peritoneal/Ascitic Fluid: N/A. Treatment Effect (required only for high-grade serous carcinomas): N/A. Regional Lymph Nodes: No lymph nodes submitted or found Pathologic Stage Classification (pTNM, AJCC 8th Edition): pT3c, pNX Representative Tumor Block: 3A Comment(s): None.    03/21/2018 Surgery   Preoperative Diagnosis: stage IIIC primary peritoneal vs ovarian cancer     Procedure(s) Performed:  Exploratory laparotomy with bilateral salpingo-oophorectomy, omentectomy radical tumor debulking for ovarian cancer, ventral hernia repair.    Surgeon: Thereasa Solo, MD. Specimens: Bilateral tubes / ovaries, omentum. Midline abdominal wall mass, left lateral abdominal wall mass.     Operative Findings:  Abdominal wall masses consistent with port site metastases at the umbilical incision (7cm) and left lateral incision (5cm). Omental caking in the infracolic omentum. Grossly normal very small tubes and ovaries. Normal diaphragms. No lymphadenopathy. Normal small and large intestine with the exception of miliary studding of tumor on the surface of the sigmoid colon and rectum in the cul de sac.    This represented an optimal cytoreduction (R1) with no gross visible disease remaining, however there was a thin rind of tumor on the lateral left fascia associated with the port site metastasis.       04/11/2018 Cancer Staging   Staging form: Ovary, Fallopian Tube, and Primary Peritoneal Carcinoma, AJCC 8th Edition - Pathologic: FIGO Stage IIIC (pT3, pN0, cM0) - Signed by Misty Lark, MD on 04/11/2018    05/23/2018 Imaging   1. Interval resolution of the anterior midline abdominal wall seroma. There is some persistent wispy soft tissue attenuation in the region of the midline  incision, nonspecific and may simply reflect granulation tissue from surgery. 2.  Stable 13 mm exophytic lesion posterior left kidney with attenuation higher than expected for a simple cyst. This may be a cyst complicated by proteinaceous debris or hemorrhage, but attention on follow-up recommended. 3. Stable mild persistent distention of proximal jejunal loops with gradual tapering to nondilated distal small bowel. 4. No overt peritoneal or mesenteric disease identified on this exam.    05/23/2018 Tumor Marker   Patient's tumor was tested for the following markers: CA-125 Results of the tumor marker test revealed 11.6    06/02/2018 - 09/20/2018 Chemotherapy   The patient had carboplatin and taxol    06/19/2018 Procedure   Status post right IJ port catheter placement. Catheter ready for use.    06/26/2018 Tumor Marker   Patient's tumor was tested for the following markers: CA-125. Results of the tumor marker test revealed 10.7    09/20/2018 Tumor Marker   Patient's tumor was tested for the following markers: CA-125. Results of the tumor marker test revealed 9.1    10/25/2018 Imaging   1. There is suspicious, abnormal asymmetric increased soft tissue involving the cecum and ascending colon. This is suspicious for residual/progressive serosal involvement by tumor. 2. No additional sites of disease identified. No evidence for nodal metastasis, solid organ metastasis or ascites.     11/07/2018 Procedure   She had negative colonoscopy evaluation   12/05/2018 Tumor Marker   Patient's tumor was tested for the following markers: CA-125 Results of the tumor marker test revealed 8.6   01/25/2019 Imaging   Ct abdomen and pelvis Signs of omentectomy and hysterectomy without definitive signs of residual recurrent disease. Small nodular focus bridging rectum and vagina is stable dating back January to 12/14/2018, potentially postoperative but suggest attention on subsequent imaging.   01/25/2019 Tumor Marker   Patient's tumor was tested for the following markers: CA-125 Results  of the tumor marker test revealed 10.3   03/15/2019 Tumor Marker   Patient's tumor was tested for the following markers: CA-125 Results of the tumor marker test revealed 9.6   03/23/2019 Imaging   No acute findings in the abdomen or pelvis.   Colonic diverticulosis.  No active diverticulitis.   Prior open tectum E and hysterectomy.   Aortic atherosclerosis.     05/10/2019 Tumor Marker   Patient's tumor was tested for the following markers: CA-125 Results of the tumor marker test revealed 11.8   05/22/2019 Imaging   1. Stable exam. No evidence of recurrent or metastatic carcinoma within the abdomen or pelvis. 2. Colonic diverticulosis, without radiographic evidence of diverticulitis.     11/08/2019 Imaging   1. Status post hysterectomy and bilateral oophorectomy. 2. Soft tissue thickening within the deep pelvis which when compared to multiple prior exams is felt to be slowly progressive. Especially given the elevated CA 125 level, this is suspicious for peritoneal recurrence. Potential imaging strategies include PET (which may be of low sensitivity secondary to the lack of well-defined dominant mass) or pelvic CT follow-up at 3 months. 3. Otherwise, no evidence of metastatic disease within the chest, abdomen, or pelvis. 4. Coronary artery atherosclerosis. Aortic Atherosclerosis (ICD10-I70.0). 5. Ventral abdominal wall hernia containing nonobstructive transverse colon, as before. 6.  Possible constipation.   12/21/2019 Tumor Marker   Patient's tumor was tested for the following markers: 95.2. Results of the tumor marker test revealed CA-`125.   01/17/2020 Imaging   1. Similar appearance of nodularity in the central pelvis along the vaginal  cuff. There is a particular nodular soft tissue density measuring 2.2 x 1.6 cm along the wall of the proximal rectum that is slightly more conspicuous than previously, raising concern for possible minimal progression. Similar appearance of the other  areas of nodular soft tissue in the central pelvis. PET-CT may prove helpful to further evaluate. 2. No ascites. 3. Left colonic diverticulosis without diverticulitis. 4. 1.7 cm exophytic lesion upper pole left kidney with well-defined homogeneous attenuation slightly higher than would be expected for simple fluid. This is probably a complex cyst. Attention on follow-up recommended. 5. Aortic Atherosclerosis (ICD10-I70.0).   01/17/2020 Tumor Marker   Patient's tumor was tested for the following markers: CA-125 Results of the tumor marker test revealed 135   01/30/2020 Echocardiogram   1. Left ventricular ejection fraction, by estimation, is 55 to 60%. Left ventricular ejection fraction by PLAX is 55 %. The left ventricle has normal function. The left ventricle has no regional wall motion abnormalities. Left ventricular diastolic parameters are indeterminate.  2. Right ventricular systolic function is normal. The right ventricular size is normal.  3. Left atrial size was borderline dilated.  4. The mitral valve is grossly normal. Trivial mitral valve regurgitation.  5. The aortic valve was not well visualized. Aortic valve regurgitation is trivial.  6. The inferior vena cava is normal in size with greater than 50% respiratory variability, suggesting right atrial pressure of 3 mmHg.   02/04/2020 -  Chemotherapy   The patient had carboplatin and doxil for chemotherapy treatment.     03/03/2020 Tumor Marker   Patient's tumor was tested for the following markers: CA-125 Results of the tumor marker test revealed 272   03/07/2020 Procedure   1. Aspiration of left lower quadrant fluid collection yields 5 mL simple straw-colored serous fluid. 2. As the fluid appears to be simple in nature, no drainage catheter was placed. 3. The aspirated fluid was sent 4 body fluid culture to confirm its sterility.   03/07/2020 Imaging   VQ scan No perfusion defects evident. No findings indicative of pulmonary  embolus.   03/07/2020 Imaging   Outside CT Multiple serosal implants again noted within the pelvis. However, increasing peritoneal nodularity and peritoneal thickening noted within the flanks and right upper quadrant as well as developing trace ascites within the mesentery all suspicious for progressive metastatic disease. Correlation with tumor markers may be helpful for further management.   Interval development of a 3.2 cm fluid collection within the left lower quadrant. Given its relatively rapid development, and alternative etiology such as a diverticular abscess should be considered. Recurrent disease, particular given the patient's history of serous adenocarcinoma, is not definitively excluded   03/31/2020 Tumor Marker   Patient's tumor was tested for the following markers: CA-125. Results of the tumor marker test revealed 129   04/29/2020 Tumor Marker   Patient's tumor was tested for the following markers: CA-125 Results of the tumor marker test revealed 66.5   05/22/2020 Echocardiogram   1. GLS -19.9%.  2. Left ventricular ejection fraction, by estimation, is 55 to 60%. The left ventricle has normal function. The left ventricle has no regional wall motion abnormalities. Left ventricular diastolic parameters were normal.  3. Right ventricular systolic function is normal. The right ventricular size is normal.  4. The mitral valve is normal in structure. Trivial mitral valve regurgitation. No evidence of mitral stenosis.  5. The aortic valve is tricuspid. Aortic valve regurgitation is not visualized. No aortic stenosis is present.  6. The inferior  vena cava is normal in size with greater than 50% respiratory variability, suggesting right atrial pressure of 3 mmHg.   05/27/2020 Tumor Marker   Patient's tumor was tested for the following markers: CA-125 Results of the tumor marker test revealed 61.2   06/10/2020 Imaging   1. Stable exam. No substantial change in the scattered areas of soft  tissue thickening in the central pelvis. 2. Interval resolution of the small fluid collection seen in the anterior left pelvis adjacent to small bowel on the previous study. 3. No evidence for new metastatic disease in the chest, abdomen, or pelvis. 4. Diffuse colonic diverticulosis without diverticulitis. 5. Aortic Atherosclerosis (ICD10-I70.0).   07/15/2020 Tumor Marker   Patient's tumor was tested for the following markers: CA-125 Results of the tumor marker test revealed 35.3   08/06/2020 Tumor Marker   Patient's tumor was tested for the following markers: CA-125 Results of the tumor marker test revealed 26.1.   09/16/2020 Tumor Marker   Patient's tumor was tested for the following markers: CA-125 Results of the tumor marker test revealed 26   09/24/2020 Imaging   1. Stable exam.  No new or progressive interval findings. 2. Areas of scattered soft tissue thickening in the central and right pelvis are stable in the interval. 3. Diffuse colonic diverticulosis with moderate stool volume. Imaging features could be compatible with clinical constipation. 4.  Aortic Atherosclerois (ICD10-170.0)       REVIEW OF SYSTEMS:   Constitutional: Denies fevers, chills or abnormal weight loss Eyes: Denies blurriness of vision Ears, nose, mouth, throat, and face: Denies mucositis or sore throat Respiratory: Denies cough, dyspnea or wheezes Cardiovascular: Denies palpitation, chest discomfort or lower extremity swelling Gastrointestinal:  Denies nausea, heartburn or change in bowel habits Skin: Denies abnormal skin rashes Lymphatics: Denies new lymphadenopathy or easy bruising Neurological:Denies numbness, tingling or new weaknesses Behavioral/Psych: Mood is stable, no new changes  All other systems were reviewed with the patient and are negative.  I have reviewed the past medical history, past surgical history, social history and family history with the patient and they are unchanged from  previous note.  ALLERGIES:  is allergic to ivp dye [iodinated diagnostic agents], propoxyphene, codeine, and ultram [tramadol hcl].  MEDICATIONS:  Current Outpatient Medications  Medication Sig Dispense Refill   acetaminophen (TYLENOL) 325 MG tablet Take 650 mg by mouth every 6 (six) hours as needed for mild pain, fever or headache.     Biotin 1 MG CAPS Take 1 mg by mouth daily.      carboxymethylcellulose (REFRESH PLUS) 0.5 % SOLN Place 1 drop into both eyes 3 (three) times daily as needed (for dry eyes.).     Cholecalciferol (VITAMIN D3 SUPER STRENGTH) 50 MCG (2000 UT) TABS Take 2,000 Units by mouth daily.     HYDROmorphone (DILAUDID) 2 MG tablet TAKE 1 TABLET BY MOUTH EVERY 4 HOURS AS NEEDED FOR SEVERE PAIN 60 tablet 0   lidocaine-prilocaine (EMLA) cream Apply 1 application topically as needed. Port access     losartan (COZAAR) 50 MG tablet Take 1 tablet by mouth daily.     metoprolol tartrate (LOPRESSOR) 25 MG tablet Take 1 tablet (25 mg total) by mouth 2 (two) times daily. 60 tablet 3   ondansetron (ZOFRAN) 8 MG tablet TAKE 1 TABLET (8 MG TOTAL) BY MOUTH EVERY 8 (EIGHT) HOURS AS NEEDED FOR REFRACTORY NAUSEA / VOMITING. START ON DAY 3 AFTER CHEMO. (Patient taking differently: Take 8 mg by mouth every 8 (eight) hours as needed  for nausea or vomiting. ) 30 tablet 1   polyethylene glycol (MIRALAX / GLYCOLAX) 17 g packet Take 17 g by mouth daily. 14 each 0   prochlorperazine (COMPAZINE) 10 MG tablet Take 1 tablet (10 mg total) by mouth every 6 (six) hours as needed (Nausea or vomiting). 30 tablet 1   senna-docusate (SENOKOT-S) 8.6-50 MG tablet Take 2 tablets by mouth 2 (two) times daily. 90 tablet 3   No current facility-administered medications for this visit.    PHYSICAL EXAMINATION: ECOG PERFORMANCE STATUS: 1 - Symptomatic but completely ambulatory  Vitals:   11/10/20 1232  BP: (!) 160/81  Pulse: 68  Resp: 18  Temp: (!) 97.5 F (36.4 C)  SpO2: 98%   Filed Weights   11/10/20  1232  Weight: 149 lb 12.8 oz (67.9 kg)    GENERAL:alert, no distress and comfortable SKIN: skin color, texture, turgor are normal, no rashes or significant lesions EYES: normal, Conjunctiva are pink and non-injected, sclera clear OROPHARYNX:no exudate, no erythema and lips, buccal mucosa, and tongue normal  NECK: supple, thyroid normal size, non-tender, without nodularity LYMPH:  no palpable lymphadenopathy in the cervical, axillary or inguinal LUNGS: clear to auscultation and percussion with normal breathing effort HEART: regular rate & rhythm and no murmurs and no lower extremity edema ABDOMEN:abdomen soft, non-tender and normal bowel sounds Musculoskeletal:no cyanosis of digits and no clubbing  NEURO: alert & oriented x 3 with fluent speech, no focal motor/sensory deficits  LABORATORY DATA:  I have reviewed the data as listed    Component Value Date/Time   NA 139 11/10/2020 1132   K 4.0 11/10/2020 1132   CL 104 11/10/2020 1132   CO2 25 11/10/2020 1132   GLUCOSE 88 11/10/2020 1132   BUN 12 11/10/2020 1132   CREATININE 0.73 11/10/2020 1132   CALCIUM 9.1 11/10/2020 1132   PROT 7.0 11/10/2020 1132   ALBUMIN 3.5 11/10/2020 1132   AST 18 11/10/2020 1132   ALT 17 11/10/2020 1132   ALKPHOS 60 11/10/2020 1132   BILITOT 0.4 11/10/2020 1132   GFRNONAA >60 11/10/2020 1132   GFRAA >60 01/17/2020 1339    No results found for: SPEP, UPEP  Lab Results  Component Value Date   WBC 10.6 (H) 11/10/2020   NEUTROABS 6.8 11/10/2020   HGB 13.6 11/10/2020   HCT 39.8 11/10/2020   MCV 87.3 11/10/2020   PLT 163 11/10/2020      Chemistry      Component Value Date/Time   NA 139 11/10/2020 1132   K 4.0 11/10/2020 1132   CL 104 11/10/2020 1132   CO2 25 11/10/2020 1132   BUN 12 11/10/2020 1132   CREATININE 0.73 11/10/2020 1132      Component Value Date/Time   CALCIUM 9.1 11/10/2020 1132   ALKPHOS 60 11/10/2020 1132   AST 18 11/10/2020 1132   ALT 17 11/10/2020 1132   BILITOT 0.4  11/10/2020 1132

## 2020-11-11 ENCOUNTER — Telehealth: Payer: Self-pay

## 2020-11-11 ENCOUNTER — Other Ambulatory Visit: Payer: Self-pay | Admitting: Hematology and Oncology

## 2020-11-11 ENCOUNTER — Other Ambulatory Visit: Payer: Self-pay

## 2020-11-11 DIAGNOSIS — C5701 Malignant neoplasm of right fallopian tube: Secondary | ICD-10-CM

## 2020-11-11 LAB — CA 125: Cancer Antigen (CA) 125: 41.1 U/mL — ABNORMAL HIGH (ref 0.0–38.1)

## 2020-11-11 MED ORDER — PREDNISONE 50 MG PO TABS: ORAL_TABLET | ORAL | 0 refills | Status: DC

## 2020-11-11 NOTE — Telephone Encounter (Signed)
Called back and given below message. She verbalized understanding. Will schedule CT scan and call her back with how to take the Prednisone prior to CT scan.

## 2020-11-11 NOTE — Telephone Encounter (Signed)
-----   Message from [name], MD sent at 11/11/2020  9:33 AM EDT ----- Pls tell her CA-125 is a bit high I recommend labs/flush CT on 9/9 and see me on 9/12  Move labs & flush appt to Friday Please call in steroids for premed For 9/12, move her appt to 12 pm, 60 mins

## 2020-11-11 NOTE — Telephone Encounter (Signed)
Called and ask husband to have [place] call the office. Unable to leave a message on the land line.

## 2020-11-14 ENCOUNTER — Telehealth: Payer: Self-pay

## 2020-11-14 NOTE — Telephone Encounter (Signed)
Called and left a message with husband asking for [place] to call the office back. Mailing out instructions on how to take the Prednisone prior to CT scan and a copy of upcoming schedule at [facility].

## 2020-11-25 ENCOUNTER — Telehealth: Payer: Self-pay | Admitting: *Deleted

## 2020-11-25 NOTE — Telephone Encounter (Signed)
Ms. Misty Hopkins called with question related to two b/p meds:  She takes Metoprolol 25 mg twice daily as prescribed by Dr. Alvy Bimler.   Also taking Losartan 50 mg twice daily as directed by Dr. Alvy Bimler following telephone call 10/02/20. Patient said Losartan was prescribed by her PCP Dr. Jenean Lindau and directions on original prescription say to take once daily. Because she is taking it two times a day, she ran out sooner than expected. Pharmacy did early fill for her this time.  Misty Hopkins wants to know if Dr. Alvy Bimler wants her to continue the Losartan twice a day? She said if she is to continue it twice a day, she needs to know if she should ask her PCP for a new/different prescription so that she has enough or if Dr. Alvy Bimler is going to give her a new prescription.   Call information/questions routed to Dr. Alvy Bimler

## 2020-11-25 NOTE — Telephone Encounter (Signed)
Contacted patient with Dr. Alvy Bimler response and encouraged her to contact her PCP to request assist for managing meds for b/p. She verbalized understanding

## 2020-11-25 NOTE — Telephone Encounter (Signed)
I told her in the past that we should not have 2 doctors managing her BP She should follow direction from her primary doctor

## 2020-11-26 ENCOUNTER — Telehealth: Payer: Self-pay | Admitting: Oncology

## 2020-11-26 NOTE — Telephone Encounter (Signed)
Misty Hopkins called and said she contacted Dr. Jenean Lindau about managing her blood pressure.  She received a call back advising that Dr. Jenean Lindau is recommending for her blood pressure to be managed by her oncologist as she is see is being seen more often and monitored here more frequently.  Advised her that I will let Dr. Alvy Bimler know.    She also gave her recent bp readings and said she will continue taking metoprolol and losartan BID until she sees Dr. Alvy Bimler in September.  She states she has enough of both medications to last until then.  BP readings: 7/22 113/76 7/23 136/73 7/24 144/77 7/25 133/79 7/26 137/86 7/27 127/78 7/28 141/78 7/29 120/80 7/30 119/71 7/31 134/82 8/2 101/66 and 130/77 in the afternoon 8/3 149/78

## 2020-11-30 ENCOUNTER — Other Ambulatory Visit: Payer: Self-pay | Admitting: Hematology and Oncology

## 2020-12-01 ENCOUNTER — Encounter: Payer: Self-pay | Admitting: Hematology and Oncology

## 2020-12-05 ENCOUNTER — Encounter: Payer: Self-pay | Admitting: Hematology and Oncology

## 2020-12-23 ENCOUNTER — Other Ambulatory Visit: Payer: Self-pay | Admitting: Hematology and Oncology

## 2020-12-25 ENCOUNTER — Telehealth: Payer: Self-pay | Admitting: Oncology

## 2020-12-25 NOTE — Telephone Encounter (Signed)
Misty Hopkins called and said she has been having cramps in her hands and in her thighs for the past 2 weeks.  She said her hands get to be like claws.  She has had the cramping start in her thighs when driving and had to pull over to try to walk it off without relief.  It can happen at any time.  She is wondering if she has an "imbalance in her blood" with her potassium or magnesium.  She knows that she has labs coming up on 01/02/21 but just wanted to let us know.  She also said she is getting tired with no energy in the afternoon.  She is wondering if this has to do with her blood pressure medications.  She is going to discuss this with Dr. Alvy Bimler at her next appointment.

## 2020-12-26 ENCOUNTER — Encounter: Payer: Self-pay | Admitting: Hematology and Oncology

## 2020-12-26 NOTE — Telephone Encounter (Signed)
Pls make sure she brings all her BP readings

## 2020-12-30 ENCOUNTER — Encounter: Payer: Self-pay | Admitting: Hematology and Oncology

## 2020-12-30 NOTE — Telephone Encounter (Signed)
Left a message advising Misty Hopkins to bring all her BP readings to her next appointment with Dr. Alvy Bimler.

## 2021-01-02 ENCOUNTER — Telehealth: Payer: Self-pay

## 2021-01-02 ENCOUNTER — Inpatient Hospital Stay: Payer: Medicare HMO | Attending: Hematology and Oncology

## 2021-01-02 ENCOUNTER — Ambulatory Visit (HOSPITAL_COMMUNITY)
Admission: RE | Admit: 2021-01-02 | Discharge: 2021-01-02 | Disposition: A | Discharge status: Home / Self Care | Payer: Medicare HMO | Source: Ambulatory Visit | Attending: Hematology and Oncology | Admitting: Hematology and Oncology

## 2021-01-02 ENCOUNTER — Encounter (HOSPITAL_COMMUNITY): Payer: Self-pay

## 2021-01-02 ENCOUNTER — Other Ambulatory Visit: Payer: Self-pay

## 2021-01-02 DIAGNOSIS — C5701 Malignant neoplasm of right fallopian tube: Secondary | ICD-10-CM | POA: Insufficient documentation

## 2021-01-02 DIAGNOSIS — K5730 Diverticulosis of large intestine without perforation or abscess without bleeding: Secondary | ICD-10-CM | POA: Insufficient documentation

## 2021-01-02 DIAGNOSIS — C562 Malignant neoplasm of left ovary: Secondary | ICD-10-CM | POA: Insufficient documentation

## 2021-01-02 DIAGNOSIS — Z79899 Other long term (current) drug therapy: Secondary | ICD-10-CM | POA: Diagnosis not present

## 2021-01-02 DIAGNOSIS — Z885 Allergy status to narcotic agent status: Secondary | ICD-10-CM | POA: Insufficient documentation

## 2021-01-02 DIAGNOSIS — I700 Atherosclerosis of aorta: Secondary | ICD-10-CM | POA: Insufficient documentation

## 2021-01-02 DIAGNOSIS — C569 Malignant neoplasm of unspecified ovary: Secondary | ICD-10-CM

## 2021-01-02 DIAGNOSIS — Z7189 Other specified counseling: Secondary | ICD-10-CM

## 2021-01-02 DIAGNOSIS — G893 Neoplasm related pain (acute) (chronic): Secondary | ICD-10-CM | POA: Insufficient documentation

## 2021-01-02 LAB — TOTAL PROTEIN, URINE DIPSTICK: Protein, ur: NEGATIVE mg/dL

## 2021-01-02 LAB — CBC WITH DIFFERENTIAL (CANCER CENTER ONLY)
Abs Immature Granulocytes: 0.04 10*3/uL (ref 0.00–0.07)
Basophils Absolute: 0 10*3/uL (ref 0.0–0.1)
Basophils Relative: 0 %
Eosinophils Absolute: 0 10*3/uL (ref 0.0–0.5)
Eosinophils Relative: 0 %
HCT: 39.4 % (ref 36.0–46.0)
Hemoglobin: 12.9 g/dL (ref 12.0–15.0)
Immature Granulocytes: 0 %
Lymphocytes Relative: 10 %
Lymphs Abs: 1.1 10*3/uL (ref 0.7–4.0)
MCH: 29.3 pg (ref 26.0–34.0)
MCHC: 32.7 g/dL (ref 30.0–36.0)
MCV: 89.5 fL (ref 80.0–100.0)
Monocytes Absolute: 0.1 10*3/uL (ref 0.1–1.0)
Monocytes Relative: 1 %
Neutro Abs: 10.1 10*3/uL — ABNORMAL HIGH (ref 1.7–7.7)
Neutrophils Relative %: 89 %
Platelet Count: 156 10*3/uL (ref 150–400)
RBC: 4.4 MIL/uL (ref 3.87–5.11)
RDW: 14.7 % (ref 11.5–15.5)
WBC Count: 11.4 10*3/uL — ABNORMAL HIGH (ref 4.0–10.5)
nRBC: 0 % (ref 0.0–0.2)

## 2021-01-02 LAB — CMP (CANCER CENTER ONLY)
ALT: 14 U/L (ref 0–44)
AST: 17 U/L (ref 15–41)
Albumin: 3.8 g/dL (ref 3.5–5.0)
Alkaline Phosphatase: 62 U/L (ref 38–126)
Anion gap: 12 (ref 5–15)
BUN: 16 mg/dL (ref 8–23)
CO2: 22 mmol/L (ref 22–32)
Calcium: 9.5 mg/dL (ref 8.9–10.3)
Chloride: 104 mmol/L (ref 98–111)
Creatinine: 0.77 mg/dL (ref 0.44–1.00)
GFR, Estimated: >60 mL/min (ref >60)
Glucose, Bld: 136 mg/dL — ABNORMAL HIGH (ref 70–99)
Potassium: 4.3 mmol/L (ref 3.5–5.1)
Sodium: 138 mmol/L (ref 135–145)
Total Bilirubin: 0.5 mg/dL (ref 0.3–1.2)
Total Protein: 7.1 g/dL (ref 6.5–8.1)

## 2021-01-02 MED ORDER — HEPARIN SOD (PORK) LOCK FLUSH 100 UNIT/ML IV SOLN
INTRAVENOUS | Status: AC
  Filled 2021-01-02: qty 5

## 2021-01-02 MED ORDER — IOHEXOL 350 MG/ML SOLN
80.0000 mL | Freq: Once | INTRAVENOUS | Status: AC | PRN
  Administered 2021-01-02: 80 mL via INTRAVENOUS

## 2021-01-02 MED ORDER — SODIUM CHLORIDE 0.9% FLUSH
10.0000 mL | Freq: Once | INTRAVENOUS | Status: AC
  Administered 2021-01-02: 10 mL

## 2021-01-02 MED ORDER — HEPARIN SOD (PORK) LOCK FLUSH 100 UNIT/ML IV SOLN
500.0000 [IU] | Freq: Once | INTRAVENOUS | Status: AC
  Administered 2021-01-02: 500 [IU] via INTRAVENOUS

## 2021-01-02 NOTE — Telephone Encounter (Signed)
She showed up in lobby asking to speak with nurse. She just had CT scan. She went to the ED at [facility] on Wednesday for abdominal pain and vomiting. She is feeling better today. ED gave her pain medication and IV fluids and she felt better.  Instructed to call the office back if needed. Continue pain medication prn. She verbalized understanding.  FYI

## 2021-01-03 LAB — CA 125: Cancer Antigen (CA) 125: 56.1 U/mL — ABNORMAL HIGH (ref 0.0–38.1)

## 2021-01-05 ENCOUNTER — Encounter: Payer: Self-pay | Admitting: Hematology and Oncology

## 2021-01-05 ENCOUNTER — Telehealth: Payer: Self-pay

## 2021-01-05 ENCOUNTER — Other Ambulatory Visit: Payer: Medicare HMO

## 2021-01-05 ENCOUNTER — Ambulatory Visit: Payer: Medicare HMO | Admitting: Hematology and Oncology

## 2021-01-05 ENCOUNTER — Other Ambulatory Visit: Payer: Self-pay

## 2021-01-05 ENCOUNTER — Inpatient Hospital Stay (HOSPITAL_BASED_OUTPATIENT_CLINIC_OR_DEPARTMENT_OTHER): Payer: Medicare HMO | Admitting: Hematology and Oncology

## 2021-01-05 DIAGNOSIS — Z7189 Other specified counseling: Secondary | ICD-10-CM

## 2021-01-05 DIAGNOSIS — G893 Neoplasm related pain (acute) (chronic): Secondary | ICD-10-CM | POA: Diagnosis not present

## 2021-01-05 DIAGNOSIS — C5701 Malignant neoplasm of right fallopian tube: Secondary | ICD-10-CM

## 2021-01-05 NOTE — Assessment & Plan Note (Signed)
We discussed prognosis without chemotherapy, likely measured in months, likely less than 6 months We discussed advanced directive and living will The patient does not want to be resuscitated I gave her a DNR order As above, I will refer her for home-based palliative care with hospice service

## 2021-01-05 NOTE — Assessment & Plan Note (Signed)
I have reviewed multiple imaging studies with the patient and her husband Unfortunately, she has signs of disease progression She is currently symptomatic with intermittent severe abdominal pain We discussed the risk and benefits of further palliative chemotherapy versus hospice Ultimately, the patient would like to pursue hospice only I will send the referral

## 2021-01-05 NOTE — Telephone Encounter (Signed)
Sarah with [facility] called back. They will admit Chaz tomorrow to hospice services.

## 2021-01-05 NOTE — Telephone Encounter (Signed)
-----   Message from [name], MD sent at 01/05/2021 12:27 PM EDT ----- Pls send referral for hospice She lives in [place]

## 2021-01-05 NOTE — Assessment & Plan Note (Signed)
She has prescription Dilaudid at home I recommend she increase the dose of Dilaudid to 2 tablets each time since the 1 tablet did not help We will get her referred to hospice for further pain management at home

## 2021-01-05 NOTE — Telephone Encounter (Signed)
[facility] and spoke with Judson Roch given referral info and faxed referral to [phone], received confirmation.

## 2021-01-05 NOTE — Progress Notes (Signed)
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[facility]) I have reviewed multiple imaging studies with the patient and her husband Unfortunately, she has signs of disease progression She is currently symptomatic with intermittent severe abdominal pain We discussed the risk and benefits of further palliative chemotherapy versus hospice Ultimately, the patient would like to pursue hospice only I will send the referral  Cancer associated pain She has prescription Dilaudid at home I recommend she increase the dose of Dilaudid to 2 tablets each time since the 1 tablet did not help We will get her referred to hospice for further pain management at home  Goals of care, counseling/discussion We discussed prognosis without chemotherapy, likely measured in months, likely less than 6 months We discussed advanced directive and living will The patient does not want to be resuscitated I gave her a DNR order As above, I will refer her for home-based palliative care with hospice service  No orders of the defined types were placed in this encounter.   All questions were answered. The patient knows to call the clinic with any problems, questions or concerns. The total time spent in the appointment was 55 minutes encounter with patients including review of chart and various tests results, discussions about plan of care and coordination of care plan   [name], MD 01/05/2021 12:25 PM  INTERVAL HISTORY: Please see below for problem oriented charting. she returns for follow-up on recent imaging study She has been complaining of severe intermittent abdominal pain but denies nausea or vomiting right now She stated the 2 mg Dilaudid was not helpful She has been to the emergency department locally recently but  did not have imaging study due to planned CT imaging last Friday She brought with her a list of documented blood pressure from home over the past week Majority of her blood pressure were within normal range  REVIEW OF SYSTEMS:   Constitutional: Denies fevers, chills or abnormal weight loss Eyes: Denies blurriness of vision Ears, nose, mouth, throat, and face: Denies mucositis or sore throat Respiratory: Denies cough, dyspnea or wheezes Cardiovascular: Denies palpitation, chest discomfort or lower extremity swelling Skin: Denies abnormal skin rashes Lymphatics: Denies new lymphadenopathy or easy bruising Neurological:Denies numbness, tingling or new weaknesses Behavioral/Psych: Mood is stable, no new changes  All other systems were reviewed with the patient and are negative.  I have reviewed the past medical history, past surgical history, social history and family history with the patient and they are unchanged from previous note.  ALLERGIES:  is allergic to ivp dye [iodinated diagnostic agents], propoxyphene, codeine, and ultram [tramadol hcl].  MEDICATIONS:  Current Outpatient Medications  Medication Sig Dispense Refill   acetaminophen (TYLENOL) 325 MG tablet Take 650 mg by mouth every 6 (six) hours as needed for mild pain, fever or headache.     Biotin 1 MG CAPS Take 1 mg by mouth daily.      carboxymethylcellulose (REFRESH PLUS) 0.5 % SOLN Place 1 drop into both eyes 3 (three) times daily as needed (for dry eyes.).     Cholecalciferol (VITAMIN D3 SUPER STRENGTH) 50 MCG (2000 UT) TABS Take 2,000 Units by mouth daily.     HYDROmorphone (DILAUDID) 2 MG tablet TAKE 1 TABLET BY MOUTH EVERY 4 HOURS AS NEEDED FOR SEVERE PAIN 60 tablet 0   lidocaine-prilocaine (EMLA)  cream Apply 1 application topically as needed. Port access     losartan (COZAAR) 50 MG tablet Take 1 tablet by mouth daily.     metoprolol tartrate (LOPRESSOR) 25 MG tablet TAKE 1 TABLET BY MOUTH TWICE A DAY 60 tablet 1    polyethylene glycol (MIRALAX / GLYCOLAX) 17 g packet Take 17 g by mouth daily. 14 each 0   senna-docusate (SENOKOT-S) 8.6-50 MG tablet Take 2 tablets by mouth 2 (two) times daily. 90 tablet 3   No current facility-administered medications for this visit.    SUMMARY OF ONCOLOGIC HISTORY: Oncology History Overview Note  High grade serous, right fallopian tube  HRD, BRCA tumor testing were neg Intolerant to Avastin   Ovarian cancer ([place])  03/21/2018 Initial Diagnosis   Ovarian cancer ([place])   02/04/2020 - 06/24/2020 Chemotherapy   The patient had carboplatin and doxil for chemotherapy treatment.     Fallopian tube cancer, carcinoma, right ([place])  10/24/2017 Initial Diagnosis   She has presentation of vague abdominal pain   02/20/2018 Imaging   Outside CT abdomen and pelvis: This revealed an incidental finding of a 1.5 cm subcapsular lesion in the lateral upper pole of the left kidney which measured higher than fluid attenuation which could represent a proteinaceous renal cyst or solid renal mass.  There was no ascites.  There is no lymphadenopathy.  There were no masses in the pelvis including ovarian or adnexal masses seen.  There was however soft tissue stranding seen with nodularity in the omental fat in the lower abdomen without evidence of ascites.  This was felt to represent either carcinomatosis or inflammatory infectious etiologies.  There was colonic diverticulosis seen without radiographic evidence of diverticulitis.  The radiologist recommended MRI of the abdomen to further work-up the renal mass   02/22/2018 Tumor Marker   Patient's tumor was tested for the following markers: CA-125. Results of the tumor marker test revealed 103.   03/06/2018 Pathology Results   Omentum, biopsy - ADENOCARCINOMA WITH PSAMMOMA BODIES, SEE COMMENT. Microscopic Comment There are scattered small foci of malignant glands with associated psammoma bodies. While the tumor is somewhat limited the cells  have a more low grade appearance. There is prominent lymphovascular invasion. Immunohistochemistry reveals the cells are positive for cytokeratin 7, ER, MOC31, PAX8, and WT-1. They are negative for calretinin, cytokeratin 20, CDX-2, and TTF-1. Overall, the findings are consistent with adenocarcinoma. The differential includes a primary gynecologic or peritoneal tumor, although the morphology is not typical of a high grade serous carcinoma.   03/06/2018 Surgery   Surgeon: Donaciano Eva    Pre-operative Diagnosis: omental mass, elevated CA 125 Operation: Laparoscopic omentectomy   Surgeon: Donaciano Eva     Operative Findings:  : normal diaphragm and upper omentum. Sigmoid colon densely adherent to left pelvic side wall consistent with history of diverticulitis. Omentum adherent to anterior abdominal wall and sigmoid colon. Surgically absent uterus. Right ovary very small and grossly normal. Left ovary obscured by adhesed sigmoid colon. No ascites. No carcinomatosis. There was a nodular firm distal omentum on the left which was adherent to the sigmoid colon and most consistent with inflammatory change.       03/21/2018 Pathology Results   1. Soft tissue mass, simple excision, midline abdominal wall mass - METASTATIC SEROUS CARCINOMA. 2. Omentum, resection for tumor - METASTATIC SEROUS CARCINOMA. 3. Adnexa - ovary +/- tube, neoplastic, right - RIGHT FALLOPIAN TUBE: -HIGH GRADE SEROUS CARCINOMA. -SEE ONCOLOGY TABLE. - RIGHT OVARY: SURFACE PSAMMOMA BODIES.  NO DEFINITIVE MALIGNANT CELLS. -INCLUSION CYSTS. 4. Adnexa - ovary +/- tube, neoplastic, left - LEFT FALLOPIAN TUBE: LUMINAL AND SURFACE SEROUS CARCINOMA. - LEFT OVARY: FOCAL PSAMMOMA BODIES AND MALIGNANT CELLS. 5. Soft tissue mass, simple excision, left abdominal wall - METASTATIC SEROUS CARCINOMA. Microscopic Comment 3. OVARY or FALLOPIAN TUBE or PRIMARY PERITONEUM: Procedure: Bilateral salpingo-oophorectomy. Abdominal  wall mass excision and omentectomy. Specimen Integrity: Intact. Tumor Site: Right fallopian tube. Ovarian Surface Involvement (required only if applicable): Present (left). Fallopian Tube Surface Involvement (required only if applicable): Present. Tumor Size: 1.5 cm. Histologic Type: High grade serous carcinoma. Histologic Grade: High grade. Implants (required for advanced stage serous/seromucinous borderline tumors only): Present. Other Tissue/ Organ Involvement: Omentum, abdominal wall, left fallopian tube and ovary. Largest Extrapelvic Peritoneal Focus (required only if applicable): >2 cm. Peritoneal/Ascitic Fluid: N/A. Treatment Effect (required only for high-grade serous carcinomas): N/A. Regional Lymph Nodes: No lymph nodes submitted or found Pathologic Stage Classification (pTNM, AJCC 8th Edition): pT3c, pNX Representative Tumor Block: 3A Comment(s): None.   03/21/2018 Surgery   Preoperative Diagnosis: stage IIIC primary peritoneal vs ovarian cancer     Procedure(s) Performed:  Exploratory laparotomy with bilateral salpingo-oophorectomy, omentectomy radical tumor debulking for ovarian cancer, ventral hernia repair.    Surgeon: Thereasa Solo, MD. Specimens: Bilateral tubes / ovaries, omentum. Midline abdominal wall mass, left lateral abdominal wall mass.     Operative Findings:  Abdominal wall masses consistent with port site metastases at the umbilical incision (7cm) and left lateral incision (5cm). Omental caking in the infracolic omentum. Grossly normal very small tubes and ovaries. Normal diaphragms. No lymphadenopathy. Normal small and large intestine with the exception of miliary studding of tumor on the surface of the sigmoid colon and rectum in the cul de sac.    This represented an optimal cytoreduction (R1) with no gross visible disease remaining, however there was a thin rind of tumor on the lateral left fascia associated with the port site metastasis.      04/11/2018  Cancer Staging   Staging form: Ovary, Fallopian Tube, and Primary Peritoneal Carcinoma, AJCC 8th Edition - Pathologic: FIGO Stage IIIC (pT3, pN0, cM0) - Signed by [name], MD on 04/11/2018   05/23/2018 Imaging   1. Interval resolution of the anterior midline abdominal wall seroma. There is some persistent wispy soft tissue attenuation in the region of the midline incision, nonspecific and may simply reflect granulation tissue from surgery. 2. Stable 13 mm exophytic lesion posterior left kidney with attenuation higher than expected for a simple cyst. This may be a cyst complicated by proteinaceous debris or hemorrhage, but attention on follow-up recommended. 3. Stable mild persistent distention of proximal jejunal loops with gradual tapering to nondilated distal small bowel. 4. No overt peritoneal or mesenteric disease identified on this exam.   05/23/2018 Tumor Marker   Patient's tumor was tested for the following markers: CA-125 Results of the tumor marker test revealed 11.6   06/02/2018 - 09/20/2018 Chemotherapy   The patient had carboplatin and taxol   06/19/2018 Procedure   Status post right IJ port catheter placement. Catheter ready for use.   06/26/2018 Tumor Marker   Patient's tumor was tested for the following markers: CA-125. Results of the tumor marker test revealed 10.7   09/20/2018 Tumor Marker   Patient's tumor was tested for the following markers: CA-125. Results of the tumor marker test revealed 9.1   10/25/2018 Imaging   1. There is suspicious, abnormal asymmetric increased soft tissue involving the cecum and ascending  colon. This is suspicious for residual/progressive serosal involvement by tumor. 2. No additional sites of disease identified. No evidence for nodal metastasis, solid organ metastasis or ascites.     11/07/2018 Procedure   She had negative colonoscopy evaluation   12/05/2018 Tumor Marker   Patient's tumor was tested for the following markers: CA-125 Results  of the tumor marker test revealed 8.6   01/25/2019 Imaging   Ct abdomen and pelvis Signs of omentectomy and hysterectomy without definitive signs of residual recurrent disease. Small nodular focus bridging rectum and vagina is stable dating back January to 12/14/2018, potentially postoperative but suggest attention on subsequent imaging.   01/25/2019 Tumor Marker   Patient's tumor was tested for the following markers: CA-125 Results of the tumor marker test revealed 10.3   03/15/2019 Tumor Marker   Patient's tumor was tested for the following markers: CA-125 Results of the tumor marker test revealed 9.6   03/23/2019 Imaging   No acute findings in the abdomen or pelvis.   Colonic diverticulosis.  No active diverticulitis.   Prior open tectum E and hysterectomy.   Aortic atherosclerosis.     05/10/2019 Tumor Marker   Patient's tumor was tested for the following markers: CA-125 Results of the tumor marker test revealed 11.8   05/22/2019 Imaging   1. Stable exam. No evidence of recurrent or metastatic carcinoma within the abdomen or pelvis. 2. Colonic diverticulosis, without radiographic evidence of diverticulitis.     11/08/2019 Imaging   1. Status post hysterectomy and bilateral oophorectomy. 2. Soft tissue thickening within the deep pelvis which when compared to multiple prior exams is felt to be slowly progressive. Especially given the elevated CA 125 level, this is suspicious for peritoneal recurrence. Potential imaging strategies include PET (which may be of low sensitivity secondary to the lack of well-defined dominant mass) or pelvic CT follow-up at 3 months. 3. Otherwise, no evidence of metastatic disease within the chest, abdomen, or pelvis. 4. Coronary artery atherosclerosis. Aortic Atherosclerosis (ICD10-I70.0). 5. Ventral abdominal wall hernia containing nonobstructive transverse colon, as before. 6.  Possible constipation.   12/21/2019 Tumor Marker   Patient's tumor was  tested for the following markers: 95.2. Results of the tumor marker test revealed CA-`125.   01/17/2020 Imaging   1. Similar appearance of nodularity in the central pelvis along the vaginal cuff. There is a particular nodular soft tissue density measuring 2.2 x 1.6 cm along the wall of the proximal rectum that is slightly more conspicuous than previously, raising concern for possible minimal progression. Similar appearance of the other areas of nodular soft tissue in the central pelvis. PET-CT may prove helpful to further evaluate. 2. No ascites. 3. Left colonic diverticulosis without diverticulitis. 4. 1.7 cm exophytic lesion upper pole left kidney with well-defined homogeneous attenuation slightly higher than would be expected for simple fluid. This is probably a complex cyst. Attention on follow-up recommended. 5. Aortic Atherosclerosis (ICD10-I70.0).   01/17/2020 Tumor Marker   Patient's tumor was tested for the following markers: CA-125 Results of the tumor marker test revealed 135   01/30/2020 Echocardiogram   1. Left ventricular ejection fraction, by estimation, is 55 to 60%. Left ventricular ejection fraction by PLAX is 55 %. The left ventricle has normal function. The left ventricle has no regional wall motion abnormalities. Left ventricular diastolic parameters are indeterminate.  2. Right ventricular systolic function is normal. The right ventricular size is normal.  3. Left atrial size was borderline dilated.  4. The mitral valve is grossly normal.  Trivial mitral valve regurgitation.  5. The aortic valve was not well visualized. Aortic valve regurgitation is trivial.  6. The inferior vena cava is normal in size with greater than 50% respiratory variability, suggesting right atrial pressure of 3 mmHg.   02/04/2020 - 06/24/2020 Chemotherapy   The patient had carboplatin and doxil for chemotherapy treatment.     03/03/2020 Tumor Marker   Patient's tumor was tested for the following  markers: CA-125 Results of the tumor marker test revealed 272   03/07/2020 Procedure   1. Aspiration of left lower quadrant fluid collection yields 5 mL simple straw-colored serous fluid. 2. As the fluid appears to be simple in nature, no drainage catheter was placed. 3. The aspirated fluid was sent 4 body fluid culture to confirm its sterility.   03/07/2020 Imaging   VQ scan No perfusion defects evident. No findings indicative of pulmonary embolus.   03/07/2020 Imaging   Outside CT Multiple serosal implants again noted within the pelvis. However, increasing peritoneal nodularity and peritoneal thickening noted within the flanks and right upper quadrant as well as developing trace ascites within the mesentery all suspicious for progressive metastatic disease. Correlation with tumor markers may be helpful for further management.   Interval development of a 3.2 cm fluid collection within the left lower quadrant. Given its relatively rapid development, and alternative etiology such as a diverticular abscess should be considered. Recurrent disease, particular given the patient's history of serous adenocarcinoma, is not definitively excluded   03/31/2020 Tumor Marker   Patient's tumor was tested for the following markers: CA-125. Results of the tumor marker test revealed 129   04/29/2020 Tumor Marker   Patient's tumor was tested for the following markers: CA-125 Results of the tumor marker test revealed 66.5   05/22/2020 Echocardiogram   1. GLS -19.9%.  2. Left ventricular ejection fraction, by estimation, is 55 to 60%. The left ventricle has normal function. The left ventricle has no regional wall motion abnormalities. Left ventricular diastolic parameters were normal.  3. Right ventricular systolic function is normal. The right ventricular size is normal.  4. The mitral valve is normal in structure. Trivial mitral valve regurgitation. No evidence of mitral stenosis.  5. The aortic valve is  tricuspid. Aortic valve regurgitation is not visualized. No aortic stenosis is present.  6. The inferior vena cava is normal in size with greater than 50% respiratory variability, suggesting right atrial pressure of 3 mmHg.   05/27/2020 Tumor Marker   Patient's tumor was tested for the following markers: CA-125 Results of the tumor marker test revealed 61.2   06/10/2020 Imaging   1. Stable exam. No substantial change in the scattered areas of soft tissue thickening in the central pelvis. 2. Interval resolution of the small fluid collection seen in the anterior left pelvis adjacent to small bowel on the previous study. 3. No evidence for new metastatic disease in the chest, abdomen, or pelvis. 4. Diffuse colonic diverticulosis without diverticulitis. 5. Aortic Atherosclerosis (ICD10-I70.0).   07/15/2020 Tumor Marker   Patient's tumor was tested for the following markers: CA-125 Results of the tumor marker test revealed 35.3   07/15/2020 - 08/26/2020 Chemotherapy   She received Avastin only, discontinued due to poor tolerance      08/06/2020 Tumor Marker   Patient's tumor was tested for the following markers: CA-125 Results of the tumor marker test revealed 26.1.   09/16/2020 Tumor Marker   Patient's tumor was tested for the following markers: CA-125 Results of the tumor marker  test revealed 26   09/24/2020 Imaging   1. Stable exam.  No new or progressive interval findings. 2. Areas of scattered soft tissue thickening in the central and right pelvis are stable in the interval. 3. Diffuse colonic diverticulosis with moderate stool volume. Imaging features could be compatible with clinical constipation. 4.  Aortic Atherosclerois (ICD10-170.0)     11/10/2020 Tumor Marker   Patient's tumor was tested for the following markers: CA-125. Results of the tumor marker test revealed 41.1.   01/02/2021 Imaging   Worsening of peritoneal and mesenteric disease in the pelvis with increasing dilation of  small bowel loops as described. Findings are concerning for developing small-bowel obstruction in the setting of malignant adhesions. At the time of imaging there is gradual transition of bowel loops towards the ileocecal region where there is narrowing of the distal ileum. Correlation with symptoms is suggested. No current evidence of perienteric stranding.   Disease in the pelvis tethers cecum, sigmoid colon, small bowel loops and urinary bladder.   Aortic Atherosclerosis (ICD10-I70.0).   01/02/2021 Tumor Marker   Patient's tumor was tested for the following markers: CA-125. Results of the tumor marker test revealed 56.1.     PHYSICAL EXAMINATION: ECOG PERFORMANCE STATUS: 1 - Symptomatic but completely ambulatory  Vitals:   01/05/21 1147  BP: (!) 152/85  Pulse: 82  Resp: 18  Temp: 98.3 F (36.8 C)  SpO2: 95%   Filed Weights   01/05/21 1147  Weight: 150 lb 12.8 oz (68.4 kg)    GENERAL:alert, no distress and comfortable NEURO: alert & oriented x 3 with fluent speech, no focal motor/sensory deficits  LABORATORY DATA:  I have reviewed the data as listed    Component Value Date/Time   NA 138 01/02/2021 1025   K 4.3 01/02/2021 1025   CL 104 01/02/2021 1025   CO2 22 01/02/2021 1025   GLUCOSE 136 (H) 01/02/2021 1025   BUN 16 01/02/2021 1025   CREATININE 0.77 01/02/2021 1025   CALCIUM 9.5 01/02/2021 1025   PROT 7.1 01/02/2021 1025   ALBUMIN 3.8 01/02/2021 1025   AST 17 01/02/2021 1025   ALT 14 01/02/2021 1025   ALKPHOS 62 01/02/2021 1025   BILITOT 0.5 01/02/2021 1025   GFRNONAA >60 01/02/2021 1025   GFRAA >60 01/17/2020 1339    No results found for: SPEP, UPEP  Lab Results  Component Value Date   WBC 11.4 (H) 01/02/2021   NEUTROABS 10.1 (H) 01/02/2021   HGB 12.9 01/02/2021   HCT 39.4 01/02/2021   MCV 89.5 01/02/2021   PLT 156 01/02/2021      Chemistry      Component Value Date/Time   NA 138 01/02/2021 1025   K 4.3 01/02/2021 1025   CL 104 01/02/2021 1025    CO2 22 01/02/2021 1025   BUN 16 01/02/2021 1025   CREATININE 0.77 01/02/2021 1025      Component Value Date/Time   CALCIUM 9.5 01/02/2021 1025   ALKPHOS 62 01/02/2021 1025   AST 17 01/02/2021 1025   ALT 14 01/02/2021 1025   BILITOT 0.5 01/02/2021 1025       RADIOGRAPHIC STUDIES: I have reviewed multiple imaging studies with the patient and her husband I have personally reviewed the radiological images as listed and agreed with the findings in the report. CT ABDOMEN PELVIS W CONTRAST  Result Date: 01/04/2021 CLINICAL DATA:  Ovarian cancer staging. History of hysterectomy and omentectomy, post chemotherapy by report. EXAM: CT ABDOMEN AND PELVIS WITH CONTRAST TECHNIQUE: Multidetector  CT imaging of the abdomen and pelvis was performed using the standard protocol following bolus administration of intravenous contrast. CONTRAST:  77m OMNIPAQUE IOHEXOL 350 MG/ML SOLN COMPARISON:  CT chest, abdomen and pelvis from February of 2022 and abdomen pelvis CT from June of 2022. FINDINGS: Lower chest: Lung bases are clear. No effusion. No consolidative process. Hepatobiliary: Smooth hepatic contours. No focal, suspicious hepatic lesion. No pericholecystic stranding or biliary duct distension. Pancreas: Unremarkable. Spleen: Unremarkable. Adrenals/Urinary Tract: Adrenal glands are normal. Small cysts in the bilateral kidneys. No hydronephrosis. No nephrolithiasis. No perinephric stranding. Distortion of the urinary bladder following postoperative changes in the pelvis and with adjacent soft tissue thickening, see below. Stomach/Bowel: Increasing small bowel distension since the prior study in the central abdomen up to 3.4 cm. This is in concert with increasing thickening and nodularity in the RIGHT lower quadrant about small bowel loops and RIGHT colon. No perienteric stranding. No pneumatosis. Colonic diverticulosis. Thickening along the sigmoid colon. See below. Vascular/Lymphatic: Aortic atherosclerosis. No  sign of aneurysm. Smooth contour of the IVC. There is no gastrohepatic or hepatoduodenal ligament lymphadenopathy. No retroperitoneal or mesenteric lymphadenopathy. No pelvic sidewall lymphadenopathy. Reproductive: Post hysterectomy. Other: Increasing distension of small bowel loops as described. Tethering of small bowel loops by presumed malignant adhesions in the lower abdomen. (Image 32/5) Abdominal wall implant contiguous with malignant adhesions (image 63/2) 1.8 x 1.5 cm previously at best 1.2 x 0.8 cm. Fascial thickening throughout the pelvis. Thickening along the cecum with nodular implant in this area measuring approximately 2.4 x 1.7 cm without measurable disease in this location previously. This tethers the upper margin of the RIGHT urinary bladder. Nodularity along the sigmoid colon measuring 2.7 x 1.7 cm when measured in a similar fashion previously approximately 1.7 x 0.7 cm. No ascites. Scattered areas of nodularity in upper abdominal mesenteric fat. Tethering of the terminal ileum with thickening of the ileocecal region along the mesenteric margin (image 63/2). Musculoskeletal: No acute musculoskeletal process. No destructive bone finding. Spinal degenerative changes. IMPRESSION: Worsening of peritoneal and mesenteric disease in the pelvis with increasing dilation of small bowel loops as described. Findings are concerning for developing small-bowel obstruction in the setting of malignant adhesions. At the time of imaging there is gradual transition of bowel loops towards the ileocecal region where there is narrowing of the distal ileum. Correlation with symptoms is suggested. No current evidence of perienteric stranding. Disease in the pelvis tethers cecum, sigmoid colon, small bowel loops and urinary bladder. Aortic Atherosclerosis (ICD10-I70.0). Electronically Signed   By: GZetta BillsM.D.   On: 01/04/2021 21:49

## 2021-01-28 ENCOUNTER — Other Ambulatory Visit: Payer: Self-pay | Admitting: Hematology and Oncology

## 2021-02-09 ENCOUNTER — Telehealth: Payer: Self-pay | Admitting: Oncology

## 2021-02-09 ENCOUNTER — Encounter: Payer: Self-pay | Admitting: Oncology

## 2021-02-09 NOTE — Telephone Encounter (Signed)
Called Gwendalynn and Guthrie back and advised them of appointment with Dr. Alvy Bimler on 02/13/21 at 1:15.  They verbalized understanding and agreement.

## 2021-02-09 NOTE — Telephone Encounter (Signed)
I can see her Friday at 115 pm, 45 mins

## 2021-02-09 NOTE — Telephone Encounter (Signed)
Misty Hopkins and her husband called and asked if they can schedule an in person appointment with Dr. Alvy Bimler.  [name] said they were in shock at their last appointment and have some questions regarding her prognosis, her bowel obstruction and what to expect in the future.  She is enrolled in hospice and the hospice nurse did see her this morning.  Advised them that I will check with Dr. Alvy Bimler and will call back.

## 2021-02-10 ENCOUNTER — Encounter: Payer: Self-pay | Admitting: Hematology and Oncology

## 2021-02-13 ENCOUNTER — Inpatient Hospital Stay: Payer: Medicare Other | Attending: Hematology and Oncology | Admitting: Hematology and Oncology

## 2021-02-13 ENCOUNTER — Encounter: Payer: Self-pay | Admitting: Hematology and Oncology

## 2021-02-13 ENCOUNTER — Other Ambulatory Visit: Payer: Self-pay | Admitting: Hematology and Oncology

## 2021-02-13 ENCOUNTER — Other Ambulatory Visit: Payer: Self-pay

## 2021-02-13 DIAGNOSIS — Z885 Allergy status to narcotic agent status: Secondary | ICD-10-CM | POA: Diagnosis not present

## 2021-02-13 DIAGNOSIS — Z7189 Other specified counseling: Secondary | ICD-10-CM | POA: Diagnosis not present

## 2021-02-13 DIAGNOSIS — I700 Atherosclerosis of aorta: Secondary | ICD-10-CM | POA: Insufficient documentation

## 2021-02-13 DIAGNOSIS — C562 Malignant neoplasm of left ovary: Secondary | ICD-10-CM | POA: Insufficient documentation

## 2021-02-13 DIAGNOSIS — C5701 Malignant neoplasm of right fallopian tube: Secondary | ICD-10-CM | POA: Diagnosis not present

## 2021-02-13 DIAGNOSIS — Z79899 Other long term (current) drug therapy: Secondary | ICD-10-CM | POA: Insufficient documentation

## 2021-02-13 DIAGNOSIS — K5730 Diverticulosis of large intestine without perforation or abscess without bleeding: Secondary | ICD-10-CM | POA: Insufficient documentation

## 2021-02-13 NOTE — Progress Notes (Signed)
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[facility]) I have reviewed recent CT imaging and plan of care with the patient and her husband We discussed the rationale of why further palliative chemotherapy was not recommended given small chance of benefit at around 20% expected benefit, and high risk of side effects of chemotherapy Quality of life is important for the patient Ultimately, she is in agreement not to pursue further treatment and I have addressed all her questions  Goals of care, counseling/discussion We have extensive discussions about goals of care She is currently enrolled in local palliative care with hospice services We discussed expected prognosis  No orders of the defined types were placed in this encounter.   All questions were answered. The patient knows to call the clinic with any problems, questions or concerns. The total time spent in the appointment was 30 minutes encounter with patients including review of chart and various tests results, discussions about plan of care and coordination of care plan   [name], MD 02/13/2021 3:07 PM  INTERVAL HISTORY: Please see below for problem oriented charting. she returns for follow-up for clarification about her treatment and condition Since last time I saw her, she went to the emergency department for signs and symptoms of carcinomatosis and partial small bowel obstruction which resolved spontaneously Both the patient and her husband have numerous questions regarding her diagnosis, recent CT findings, why surgery is not indicated, treatment options, prognosis and life expectancy Today, she feels well Denies abdominal pain  REVIEW OF SYSTEMS:   Constitutional: Denies fevers, chills or abnormal weight loss Eyes: Denies  blurriness of vision Ears, nose, mouth, throat, and face: Denies mucositis or sore throat Respiratory: Denies cough, dyspnea or wheezes Cardiovascular: Denies palpitation, chest discomfort or lower extremity swelling Gastrointestinal:  Denies nausea, heartburn or change in bowel habits Skin: Denies abnormal skin rashes Lymphatics: Denies new lymphadenopathy or easy bruising Neurological:Denies numbness, tingling or new weaknesses Behavioral/Psych: Mood is stable, no new changes  All other systems were reviewed with the patient and are negative.  I have reviewed the past medical history, past surgical history, social history and family history with the patient and they are unchanged from previous note.  ALLERGIES:  is allergic to ivp dye [iodinated diagnostic agents], propoxyphene, codeine, and ultram [tramadol hcl].  MEDICATIONS:  Current Outpatient Medications  Medication Sig Dispense Refill   acetaminophen (TYLENOL) 325 MG tablet Take 650 mg by mouth every 6 (six) hours as needed for mild pain, fever or headache.     Biotin 1 MG CAPS Take 1 mg by mouth daily.      carboxymethylcellulose (REFRESH PLUS) 0.5 % SOLN Place 1 drop into both eyes 3 (three) times daily as needed (for dry eyes.).     Cholecalciferol (VITAMIN D3 SUPER STRENGTH) 50 MCG (2000 UT) TABS Take 2,000 Units by mouth daily.     HYDROmorphone (DILAUDID) 2 MG tablet TAKE 1 TABLET BY MOUTH EVERY 4 HOURS AS NEEDED FOR SEVERE PAIN 60 tablet 0   lidocaine-prilocaine (EMLA) cream Apply 1 application topically as needed. Port access     losartan (COZAAR) 50 MG tablet Take 1 tablet by mouth daily.     metoprolol tartrate (LOPRESSOR) 25 MG tablet TAKE 1 TABLET BY MOUTH TWICE A DAY 60 tablet 1   polyethylene glycol (MIRALAX /  GLYCOLAX) 17 g packet Take 17 g by mouth daily. 14 each 0   senna-docusate (SENOKOT-S) 8.6-50 MG tablet Take 2 tablets by mouth 2 (two) times daily. 90 tablet 3   No current facility-administered medications  for this visit.    SUMMARY OF ONCOLOGIC HISTORY: Oncology History Overview Note  High grade serous, right fallopian tube  HRD, BRCA tumor testing were neg Intolerant to Avastin   Ovarian cancer ([place])  03/21/2018 Initial Diagnosis   Ovarian cancer ([place])   02/04/2020 - 06/24/2020 Chemotherapy   The patient had carboplatin and doxil for chemotherapy treatment.     Fallopian tube cancer, carcinoma, right ([place])  10/24/2017 Initial Diagnosis   She has presentation of vague abdominal pain   02/20/2018 Imaging   Outside CT abdomen and pelvis: This revealed an incidental finding of a 1.5 cm subcapsular lesion in the lateral upper pole of the left kidney which measured higher than fluid attenuation which could represent a proteinaceous renal cyst or solid renal mass.  There was no ascites.  There is no lymphadenopathy.  There were no masses in the pelvis including ovarian or adnexal masses seen.  There was however soft tissue stranding seen with nodularity in the omental fat in the lower abdomen without evidence of ascites.  This was felt to represent either carcinomatosis or inflammatory infectious etiologies.  There was colonic diverticulosis seen without radiographic evidence of diverticulitis.  The radiologist recommended MRI of the abdomen to further work-up the renal mass   02/22/2018 Tumor Marker   Patient's tumor was tested for the following markers: CA-125. Results of the tumor marker test revealed 103.   03/06/2018 Pathology Results   Omentum, biopsy - ADENOCARCINOMA WITH PSAMMOMA BODIES, SEE COMMENT. Microscopic Comment There are scattered small foci of malignant glands with associated psammoma bodies. While the tumor is somewhat limited the cells have a more low grade appearance. There is prominent lymphovascular invasion. Immunohistochemistry reveals the cells are positive for cytokeratin 7, ER, MOC31, PAX8, and WT-1. They are negative for calretinin, cytokeratin 20, CDX-2, and TTF-1.  Overall, the findings are consistent with adenocarcinoma. The differential includes a primary gynecologic or peritoneal tumor, although the morphology is not typical of a high grade serous carcinoma.   03/06/2018 Surgery   Surgeon: Donaciano Eva    Pre-operative Diagnosis: omental mass, elevated CA 125 Operation: Laparoscopic omentectomy   Surgeon: Donaciano Eva     Operative Findings:  : normal diaphragm and upper omentum. Sigmoid colon densely adherent to left pelvic side wall consistent with history of diverticulitis. Omentum adherent to anterior abdominal wall and sigmoid colon. Surgically absent uterus. Right ovary very small and grossly normal. Left ovary obscured by adhesed sigmoid colon. No ascites. No carcinomatosis. There was a nodular firm distal omentum on the left which was adherent to the sigmoid colon and most consistent with inflammatory change.       03/21/2018 Pathology Results   1. Soft tissue mass, simple excision, midline abdominal wall mass - METASTATIC SEROUS CARCINOMA. 2. Omentum, resection for tumor - METASTATIC SEROUS CARCINOMA. 3. Adnexa - ovary +/- tube, neoplastic, right - RIGHT FALLOPIAN TUBE: -HIGH GRADE SEROUS CARCINOMA. -SEE ONCOLOGY TABLE. - RIGHT OVARY: SURFACE PSAMMOMA BODIES. NO DEFINITIVE MALIGNANT CELLS. -INCLUSION CYSTS. 4. Adnexa - ovary +/- tube, neoplastic, left - LEFT FALLOPIAN TUBE: LUMINAL AND SURFACE SEROUS CARCINOMA. - LEFT OVARY: FOCAL PSAMMOMA BODIES AND MALIGNANT CELLS. 5. Soft tissue mass, simple excision, left abdominal wall - METASTATIC SEROUS CARCINOMA. Microscopic Comment 3. OVARY or FALLOPIAN  TUBE or PRIMARY PERITONEUM: Procedure: Bilateral salpingo-oophorectomy. Abdominal wall mass excision and omentectomy. Specimen Integrity: Intact. Tumor Site: Right fallopian tube. Ovarian Surface Involvement (required only if applicable): Present (left). Fallopian Tube Surface Involvement (required only if applicable):  Present. Tumor Size: 1.5 cm. Histologic Type: High grade serous carcinoma. Histologic Grade: High grade. Implants (required for advanced stage serous/seromucinous borderline tumors only): Present. Other Tissue/ Organ Involvement: Omentum, abdominal wall, left fallopian tube and ovary. Largest Extrapelvic Peritoneal Focus (required only if applicable): >2 cm. Peritoneal/Ascitic Fluid: N/A. Treatment Effect (required only for high-grade serous carcinomas): N/A. Regional Lymph Nodes: No lymph nodes submitted or found Pathologic Stage Classification (pTNM, AJCC 8th Edition): pT3c, pNX Representative Tumor Block: 3A Comment(s): None.   03/21/2018 Surgery   Preoperative Diagnosis: stage IIIC primary peritoneal vs ovarian cancer     Procedure(s) Performed:  Exploratory laparotomy with bilateral salpingo-oophorectomy, omentectomy radical tumor debulking for ovarian cancer, ventral hernia repair.    Surgeon: Thereasa Solo, MD. Specimens: Bilateral tubes / ovaries, omentum. Midline abdominal wall mass, left lateral abdominal wall mass.     Operative Findings:  Abdominal wall masses consistent with port site metastases at the umbilical incision (7cm) and left lateral incision (5cm). Omental caking in the infracolic omentum. Grossly normal very small tubes and ovaries. Normal diaphragms. No lymphadenopathy. Normal small and large intestine with the exception of miliary studding of tumor on the surface of the sigmoid colon and rectum in the cul de sac.    This represented an optimal cytoreduction (R1) with no gross visible disease remaining, however there was a thin rind of tumor on the lateral left fascia associated with the port site metastasis.      04/11/2018 Cancer Staging   Staging form: Ovary, Fallopian Tube, and Primary Peritoneal Carcinoma, AJCC 8th Edition - Pathologic: FIGO Stage IIIC (pT3, pN0, cM0) - Signed by [name], MD on 04/11/2018    05/23/2018 Imaging   1. Interval  resolution of the anterior midline abdominal wall seroma. There is some persistent wispy soft tissue attenuation in the region of the midline incision, nonspecific and may simply reflect granulation tissue from surgery. 2. Stable 13 mm exophytic lesion posterior left kidney with attenuation higher than expected for a simple cyst. This may be a cyst complicated by proteinaceous debris or hemorrhage, but attention on follow-up recommended. 3. Stable mild persistent distention of proximal jejunal loops with gradual tapering to nondilated distal small bowel. 4. No overt peritoneal or mesenteric disease identified on this exam.   05/23/2018 Tumor Marker   Patient's tumor was tested for the following markers: CA-125 Results of the tumor marker test revealed 11.6   06/02/2018 - 09/20/2018 Chemotherapy   The patient had carboplatin and taxol   06/19/2018 Procedure   Status post right IJ port catheter placement. Catheter ready for use.   06/26/2018 Tumor Marker   Patient's tumor was tested for the following markers: CA-125. Results of the tumor marker test revealed 10.7   09/20/2018 Tumor Marker   Patient's tumor was tested for the following markers: CA-125. Results of the tumor marker test revealed 9.1   10/25/2018 Imaging   1. There is suspicious, abnormal asymmetric increased soft tissue involving the cecum and ascending colon. This is suspicious for residual/progressive serosal involvement by tumor. 2. No additional sites of disease identified. No evidence for nodal metastasis, solid organ metastasis or ascites.     11/07/2018 Procedure   She had negative colonoscopy evaluation   12/05/2018 Tumor Marker   Patient's tumor was  tested for the following markers: CA-125 Results of the tumor marker test revealed 8.6   01/25/2019 Imaging   Ct abdomen and pelvis Signs of omentectomy and hysterectomy without definitive signs of residual recurrent disease. Small nodular focus bridging rectum and vagina is  stable dating back January to 12/14/2018, potentially postoperative but suggest attention on subsequent imaging.   01/25/2019 Tumor Marker   Patient's tumor was tested for the following markers: CA-125 Results of the tumor marker test revealed 10.3   03/15/2019 Tumor Marker   Patient's tumor was tested for the following markers: CA-125 Results of the tumor marker test revealed 9.6   03/23/2019 Imaging   No acute findings in the abdomen or pelvis.   Colonic diverticulosis.  No active diverticulitis.   Prior open tectum E and hysterectomy.   Aortic atherosclerosis.     05/10/2019 Tumor Marker   Patient's tumor was tested for the following markers: CA-125 Results of the tumor marker test revealed 11.8   05/22/2019 Imaging   1. Stable exam. No evidence of recurrent or metastatic carcinoma within the abdomen or pelvis. 2. Colonic diverticulosis, without radiographic evidence of diverticulitis.     11/08/2019 Imaging   1. Status post hysterectomy and bilateral oophorectomy. 2. Soft tissue thickening within the deep pelvis which when compared to multiple prior exams is felt to be slowly progressive. Especially given the elevated CA 125 level, this is suspicious for peritoneal recurrence. Potential imaging strategies include PET (which may be of low sensitivity secondary to the lack of well-defined dominant mass) or pelvic CT follow-up at 3 months. 3. Otherwise, no evidence of metastatic disease within the chest, abdomen, or pelvis. 4. Coronary artery atherosclerosis. Aortic Atherosclerosis (ICD10-I70.0). 5. Ventral abdominal wall hernia containing nonobstructive transverse colon, as before. 6.  Possible constipation.   12/21/2019 Tumor Marker   Patient's tumor was tested for the following markers: 95.2. Results of the tumor marker test revealed CA-`125.   01/17/2020 Imaging   1. Similar appearance of nodularity in the central pelvis along the vaginal cuff. There is a particular nodular  soft tissue density measuring 2.2 x 1.6 cm along the wall of the proximal rectum that is slightly more conspicuous than previously, raising concern for possible minimal progression. Similar appearance of the other areas of nodular soft tissue in the central pelvis. PET-CT may prove helpful to further evaluate. 2. No ascites. 3. Left colonic diverticulosis without diverticulitis. 4. 1.7 cm exophytic lesion upper pole left kidney with well-defined homogeneous attenuation slightly higher than would be expected for simple fluid. This is probably a complex cyst. Attention on follow-up recommended. 5. Aortic Atherosclerosis (ICD10-I70.0).   01/17/2020 Tumor Marker   Patient's tumor was tested for the following markers: CA-125 Results of the tumor marker test revealed 135   01/30/2020 Echocardiogram   1. Left ventricular ejection fraction, by estimation, is 55 to 60%. Left ventricular ejection fraction by PLAX is 55 %. The left ventricle has normal function. The left ventricle has no regional wall motion abnormalities. Left ventricular diastolic parameters are indeterminate.  2. Right ventricular systolic function is normal. The right ventricular size is normal.  3. Left atrial size was borderline dilated.  4. The mitral valve is grossly normal. Trivial mitral valve regurgitation.  5. The aortic valve was not well visualized. Aortic valve regurgitation is trivial.  6. The inferior vena cava is normal in size with greater than 50% respiratory variability, suggesting right atrial pressure of 3 mmHg.   02/04/2020 - 06/24/2020 Chemotherapy   The  patient had carboplatin and doxil for chemotherapy treatment.     03/03/2020 Tumor Marker   Patient's tumor was tested for the following markers: CA-125 Results of the tumor marker test revealed 272   03/07/2020 Procedure   1. Aspiration of left lower quadrant fluid collection yields 5 mL simple straw-colored serous fluid. 2. As the fluid appears to be simple in  nature, no drainage catheter was placed. 3. The aspirated fluid was sent 4 body fluid culture to confirm its sterility.   03/07/2020 Imaging   VQ scan No perfusion defects evident. No findings indicative of pulmonary embolus.   03/07/2020 Imaging   Outside CT Multiple serosal implants again noted within the pelvis. However, increasing peritoneal nodularity and peritoneal thickening noted within the flanks and right upper quadrant as well as developing trace ascites within the mesentery all suspicious for progressive metastatic disease. Correlation with tumor markers may be helpful for further management.   Interval development of a 3.2 cm fluid collection within the left lower quadrant. Given its relatively rapid development, and alternative etiology such as a diverticular abscess should be considered. Recurrent disease, particular given the patient's history of serous adenocarcinoma, is not definitively excluded   03/31/2020 Tumor Marker   Patient's tumor was tested for the following markers: CA-125. Results of the tumor marker test revealed 129   04/29/2020 Tumor Marker   Patient's tumor was tested for the following markers: CA-125 Results of the tumor marker test revealed 66.5   05/22/2020 Echocardiogram   1. GLS -19.9%.  2. Left ventricular ejection fraction, by estimation, is 55 to 60%. The left ventricle has normal function. The left ventricle has no regional wall motion abnormalities. Left ventricular diastolic parameters were normal.  3. Right ventricular systolic function is normal. The right ventricular size is normal.  4. The mitral valve is normal in structure. Trivial mitral valve regurgitation. No evidence of mitral stenosis.  5. The aortic valve is tricuspid. Aortic valve regurgitation is not visualized. No aortic stenosis is present.  6. The inferior vena cava is normal in size with greater than 50% respiratory variability, suggesting right atrial pressure of 3 mmHg.    05/27/2020 Tumor Marker   Patient's tumor was tested for the following markers: CA-125 Results of the tumor marker test revealed 61.2   06/10/2020 Imaging   1. Stable exam. No substantial change in the scattered areas of soft tissue thickening in the central pelvis. 2. Interval resolution of the small fluid collection seen in the anterior left pelvis adjacent to small bowel on the previous study. 3. No evidence for new metastatic disease in the chest, abdomen, or pelvis. 4. Diffuse colonic diverticulosis without diverticulitis. 5. Aortic Atherosclerosis (ICD10-I70.0).   07/15/2020 Tumor Marker   Patient's tumor was tested for the following markers: CA-125 Results of the tumor marker test revealed 35.3   07/15/2020 - 08/26/2020 Chemotherapy   She received Avastin only, discontinued due to poor tolerance      08/06/2020 Tumor Marker   Patient's tumor was tested for the following markers: CA-125 Results of the tumor marker test revealed 26.1.   09/16/2020 Tumor Marker   Patient's tumor was tested for the following markers: CA-125 Results of the tumor marker test revealed 26   09/24/2020 Imaging   1. Stable exam.  No new or progressive interval findings. 2. Areas of scattered soft tissue thickening in the central and right pelvis are stable in the interval. 3. Diffuse colonic diverticulosis with moderate stool volume. Imaging features could be compatible  with clinical constipation. 4.  Aortic Atherosclerois (ICD10-170.0)     11/10/2020 Tumor Marker   Patient's tumor was tested for the following markers: CA-125. Results of the tumor marker test revealed 41.1.   01/02/2021 Imaging   Worsening of peritoneal and mesenteric disease in the pelvis with increasing dilation of small bowel loops as described. Findings are concerning for developing small-bowel obstruction in the setting of malignant adhesions. At the time of imaging there is gradual transition of bowel loops towards the ileocecal  region where there is narrowing of the distal ileum. Correlation with symptoms is suggested. No current evidence of perienteric stranding.   Disease in the pelvis tethers cecum, sigmoid colon, small bowel loops and urinary bladder.   Aortic Atherosclerosis (ICD10-I70.0).   01/02/2021 Tumor Marker   Patient's tumor was tested for the following markers: CA-125. Results of the tumor marker test revealed 56.1.     PHYSICAL EXAMINATION: ECOG PERFORMANCE STATUS: 1 - Symptomatic but completely ambulatory  Vitals:   02/13/21 1318  BP: 108/67  Pulse: 60  Resp: 18  Temp: 99.2 F (37.3 C)  SpO2: 100%   Filed Weights   02/13/21 1318  Weight: 145 lb 3.2 oz (65.9 kg)    GENERAL:alert, no distress and comfortable NEURO: alert & oriented x 3 with fluent speech, no focal motor/sensory deficits  LABORATORY DATA:  I have reviewed the data as listed    Component Value Date/Time   NA 138 01/02/2021 1025   K 4.3 01/02/2021 1025   CL 104 01/02/2021 1025   CO2 22 01/02/2021 1025   GLUCOSE 136 (H) 01/02/2021 1025   BUN 16 01/02/2021 1025   CREATININE 0.77 01/02/2021 1025   CALCIUM 9.5 01/02/2021 1025   PROT 7.1 01/02/2021 1025   ALBUMIN 3.8 01/02/2021 1025   AST 17 01/02/2021 1025   ALT 14 01/02/2021 1025   ALKPHOS 62 01/02/2021 1025   BILITOT 0.5 01/02/2021 1025   GFRNONAA >60 01/02/2021 1025   GFRAA >60 01/17/2020 1339    No results found for: SPEP, UPEP  Lab Results  Component Value Date   WBC 11.4 (H) 01/02/2021   NEUTROABS 10.1 (H) 01/02/2021   HGB 12.9 01/02/2021   HCT 39.4 01/02/2021   MCV 89.5 01/02/2021   PLT 156 01/02/2021      Chemistry      Component Value Date/Time   NA 138 01/02/2021 1025   K 4.3 01/02/2021 1025   CL 104 01/02/2021 1025   CO2 22 01/02/2021 1025   BUN 16 01/02/2021 1025   CREATININE 0.77 01/02/2021 1025      Component Value Date/Time   CALCIUM 9.5 01/02/2021 1025   ALKPHOS 62 01/02/2021 1025   AST 17 01/02/2021 1025   ALT 14  01/02/2021 1025   BILITOT 0.5 01/02/2021 1025

## 2021-02-13 NOTE — Assessment & Plan Note (Signed)
I have reviewed recent CT imaging and plan of care with the patient and her husband We discussed the rationale of why further palliative chemotherapy was not recommended given small chance of benefit at around 20% expected benefit, and high risk of side effects of chemotherapy Quality of life is important for the patient Ultimately, she is in agreement not to pursue further treatment and I have addressed all her questions

## 2021-02-13 NOTE — Assessment & Plan Note (Signed)
We have extensive discussions about goals of care She is currently enrolled in local palliative care with hospice services We discussed expected prognosis

## 2021-02-16 ENCOUNTER — Encounter: Payer: Self-pay | Admitting: Hematology and Oncology

## 2021-02-23 ENCOUNTER — Encounter: Payer: Self-pay | Admitting: Hematology and Oncology

## 2021-02-28 ENCOUNTER — Other Ambulatory Visit: Payer: Self-pay | Admitting: Hematology and Oncology

## 2021-03-02 ENCOUNTER — Encounter: Payer: Self-pay | Admitting: Hematology and Oncology

## 2021-03-09 ENCOUNTER — Other Ambulatory Visit: Payer: Self-pay | Admitting: Hematology and Oncology

## 2021-03-25 ENCOUNTER — Encounter: Payer: Self-pay | Admitting: Hematology and Oncology

## 2021-03-26 ENCOUNTER — Encounter: Payer: Self-pay | Admitting: Hematology and Oncology

## 2021-04-01 ENCOUNTER — Encounter: Payer: Self-pay | Admitting: Hematology and Oncology

## 2021-04-01 ENCOUNTER — Encounter: Payer: Self-pay | Admitting: Internal Medicine

## 2021-04-01 ENCOUNTER — Ambulatory Visit (INDEPENDENT_AMBULATORY_CARE_PROVIDER_SITE_OTHER): Payer: Medicare Other | Admitting: Internal Medicine

## 2021-04-01 VITALS — BP 124/76 | HR 85 | Ht 64.0 in | Wt 132.0 lb

## 2021-04-01 DIAGNOSIS — C786 Secondary malignant neoplasm of retroperitoneum and peritoneum: Secondary | ICD-10-CM | POA: Diagnosis not present

## 2021-04-01 DIAGNOSIS — C5701 Malignant neoplasm of right fallopian tube: Secondary | ICD-10-CM | POA: Diagnosis not present

## 2021-04-01 DIAGNOSIS — K56600 Partial intestinal obstruction, unspecified as to cause: Secondary | ICD-10-CM | POA: Diagnosis not present

## 2021-04-01 DIAGNOSIS — R112 Nausea with vomiting, unspecified: Secondary | ICD-10-CM | POA: Diagnosis not present

## 2021-04-01 MED ORDER — METOCLOPRAMIDE HCL 10 MG PO TABS: 10.0000 mg | ORAL_TABLET | Freq: Three times a day (TID) | ORAL | 2 refills | Status: DC

## 2021-04-01 NOTE — Progress Notes (Signed)
Subjective:    Patient ID: Misty Hopkins, female    DOB: 1940/06/27, 80 y.o.   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  She is here today with her husband.  She is currently on hospice therapy.  She was managed by Dr. Alvy Bimler and placed on hospice within the last 2 months.  It was felt that all chemotherapeutic options have been exhausted and that any additional therapy would lead to other morbidity without significant chance for improvement in her malignancy.  DOS of late she has been struggling with inability to eat.  She is eating very little food on a daily basis.  She is able to drink liquids.  She has noticed her taste to really have decreased.  She has lost about 20 pounds in the last 3 weeks.  She reports that at times she is able to eat even solid food at certain times but then several hours later and even overnight will wake up with vomiting.  At times she will have dry heaves but then meaningful emesis of previously ingested food.  Often she has some relief of her abdominal pressure symptom after vomiting.  She feels that there is increasing firmness in her mid and lower abdomen.  She also reports feeling her abdomen "roll" during the day and also make noise.  She is having more indigestion and pyrosis.  She has been using the scopolamine patch and the fentanyl patch.  She is using ondansetron 8 mg on an as-needed basis for nausea.  Haldol 1 mg liquid is used for refractory nausea.  She is taking famotidine 20 mg twice daily.  She is using lactulose about 2 days/week to help with bowel movement.  She has been having bowel movement every 2 to 3 days.  No blood or melena in her stool.   Review of Systems As per HPI, otherwise  negative  Current Medications, Allergies, Past Medical History, Past Surgical History, Family History and Social History were reviewed in [employer] record.    Objective:   Physical Exam BP 124/76   Pulse 85   Ht 5\' 4"  (1.626 m)   Wt 132 lb (59.9 kg)   BMI 22.66 kg/m  Gen: awake, alert, NAD HEENT: anicteric  CV: RRR, no mrg Pulm: CTA b/l Abd: Firmness in the mid lower abdomen consistent with tumor, positive but hypoactive bowel sounds, abdomen is mildly tender but not overly distended  Ext: no c/c/e Neuro: nonfocal  CT ABDOMEN AND PELVIS WITH CONTRAST   TECHNIQUE: Multidetector CT imaging of the abdomen and pelvis was performed using the standard protocol following bolus administration of intravenous contrast.   CONTRAST:  47mL OMNIPAQUE IOHEXOL 350 MG/ML SOLN   COMPARISON:  CT chest, abdomen and pelvis from February of 2022 and abdomen pelvis CT from June of 2022.   FINDINGS: Lower chest: Lung bases are clear. No effusion. No consolidative process.   Hepatobiliary: Smooth hepatic contours. No focal, suspicious hepatic lesion. No pericholecystic stranding or biliary duct distension.   Pancreas: Unremarkable.   Spleen: Unremarkable.   Adrenals/Urinary Tract: Adrenal glands are normal. Small cysts in the bilateral kidneys. No hydronephrosis. No nephrolithiasis. No perinephric stranding.   Distortion of the urinary bladder following postoperative changes in the pelvis and with adjacent soft tissue  thickening, see below.   Stomach/Bowel: Increasing small bowel distension since the prior study in the central abdomen up to 3.4 cm. This is in concert with increasing thickening and nodularity in the RIGHT lower quadrant about small bowel loops and RIGHT colon. No perienteric stranding. No pneumatosis. Colonic diverticulosis. Thickening along the sigmoid colon. See below.   Vascular/Lymphatic:   Aortic atherosclerosis. No sign of aneurysm.  Smooth contour of the IVC. There is no gastrohepatic or hepatoduodenal ligament lymphadenopathy. No retroperitoneal or mesenteric lymphadenopathy.   No pelvic sidewall lymphadenopathy.   Reproductive: Post hysterectomy.   Other: Increasing distension of small bowel loops as described. Tethering of small bowel loops by presumed malignant adhesions in the lower abdomen. (Image 32/5)   Abdominal wall implant contiguous with malignant adhesions (image 63/2) 1.8 x 1.5 cm previously at best 1.2 x 0.8 cm.   Fascial thickening throughout the pelvis. Thickening along the cecum with nodular implant in this area measuring approximately 2.4 x 1.7 cm without measurable disease in this location previously. This tethers the upper margin of the RIGHT urinary bladder.   Nodularity along the sigmoid colon measuring 2.7 x 1.7 cm when measured in a similar fashion previously approximately 1.7 x 0.7 cm. No ascites. Scattered areas of nodularity in upper abdominal mesenteric fat.   Tethering of the terminal ileum with thickening of the ileocecal region along the mesenteric margin (image 63/2).   Musculoskeletal: No acute musculoskeletal process. No destructive bone finding. Spinal degenerative changes.   IMPRESSION: Worsening of peritoneal and mesenteric disease in the pelvis with increasing dilation of small bowel loops as described. Findings are concerning for developing small-bowel obstruction in the setting of malignant adhesions. At the time of imaging there is gradual transition of bowel loops towards the ileocecal region where there is narrowing of the distal ileum. Correlation with symptoms is suggested. No current evidence of perienteric stranding.   Disease in the pelvis tethers cecum, sigmoid colon, small bowel loops and urinary bladder.   Aortic Atherosclerosis (ICD10-I70.0).     Electronically Signed   By: Zetta Bills M.D.   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   Metastatic fallopian tube cancer with omental involvement/malignant abdominal adhesions causing partial bowel obstruction/intermittent nausea and vomiting --we discussed her symptoms at length today and she is well aware that this is malignant and would not be expected to improve.  She is only hoping for some medication which may allow her to have less vomiting but also be able to tolerate more by mouth.  She is hoping to make any time that she has left more pleasant.  We discussed this at length today and I recommended the following: --Continue her scopolamine patch --Schedule ondansetron 8 mg by mouth every 8 hours --Continue famotidine 20 mg every 12 hours --Metoclopramide 10 mg before meals and at bedtime --Low fiber diet; we reviewed a gastroparesis type diet and I recommended that she live in this step 1 and step 2 of this diet.  This focuses on very low fiber foods which hopefully will be easier to navigate her small intestine which is encased with tumor. --She can still use the liquid Haldol for refractory nausea and vomiting --Continue lactulose but perhaps increase this to 3  to 4 days/week to help facilitate more frequent bowel movement --She will return to hospice care but I am happy to see her back if I can be helpful in any way  30 minutes total spent today including patient facing time, coordination of care, reviewing medical history/procedures/pertinent radiology studies, and documentation of the encounter.

## 2021-04-01 NOTE — Patient Instructions (Signed)
Please take Zofran (ondansetron) 8 mcg every 8 hours on a scheduled basis.  We have given you a prescription for Reglan 10 mg to take 1 tablet before meals and at bedtime.  Continue Pepcid 20 mg twice daily on a scheduled basis.  We have given you a gastroparesis/low fiber diet to look over and follow. You should follow step 2 diet.  Continue Lactulose. You should use this at least 3 days a week.  Continue using the scopolamine patch.  If you are age 78 or older, your body mass index should be between 23-30. Your Body mass index is 22.66 kg/m. If this is out of the aforementioned range listed, please consider follow up with your Primary Care Provider.  If you are age 37 or younger, your body mass index should be between 19-25. Your Body mass index is 22.66 kg/m. If this is out of the aformentioned range listed, please consider follow up with your Primary Care Provider.   ________________________________________________________  The [facility] providers would like to encourage you to use [facility] to communicate with providers for non-urgent requests or questions.  Due to long hold times on the telephone, sending your provider a message by [facility] may be a faster and more efficient way to get a response.  Please allow 48 business hours for a response.  Please remember that this is for non-urgent requests.  _______________________________________________________  Due to recent changes in healthcare laws, you may see the results of your imaging and laboratory studies on MyChart before your provider has had a chance to review them.  We understand that in some cases there may be results that are confusing or concerning to you. Not all laboratory results come back in the same time frame and the provider may be waiting for multiple results in order to interpret others.  Please give [place] 48 hours in order for your provider to thoroughly review all the results before contacting the office for clarification  of your results.

## 2021-04-07 ENCOUNTER — Telehealth: Payer: Self-pay | Admitting: Internal Medicine

## 2021-04-07 NOTE — Telephone Encounter (Signed)
Inbound call from patient, states that she has concerns about her proscription for Reglan. States that it has some risky side affects and is requesting a call back. Please advise.   [phone]

## 2021-04-07 NOTE — Telephone Encounter (Signed)
Spoke with pt and she is concerned about the possible side effects of Reglan. Let her know if she notices any irregular movements with the medication she needs to stop the med immediately and let Dr. Hilarie Fredrickson know. She verbalized understanding.

## 2021-04-16 ENCOUNTER — Encounter: Payer: Self-pay | Admitting: Hematology and Oncology

## 2021-06-24 DEATH — deceased

## 2021-06-25 ENCOUNTER — Encounter: Payer: Self-pay | Admitting: Hematology and Oncology

## 2021-10-30 IMAGING — CT CT CHEST-ABD-PELV W/ CM
3 of 5 series · 14 of 36 positions shown, 16 images · IV contrast (OMNIPAQUE)
Comparison: [HOSPITAL] [place] CT abdomen/pelvis exam from 03/07/2020.
Chest CT 11/08/2019.

CLINICAL DATA: Ovarian cancer.  Restaging.

EXAM:
CT CHEST, ABDOMEN, AND PELVIS WITH CONTRAST
TECHNIQUE: Multidetector CT imaging of the chest, abdomen and pelvis was
performed following the standard protocol during bolus
administration of intravenous contrast.
CONTRAST:  100mL OMNIPAQUE IOHEXOL 300 MG/ML  SOLN

[Series 2: cap with · axial · 0.77mm/px · z∈[-410,+80]mm · 9 of 124 slices shown, 11 images]
[im 13/124  mediastinal]
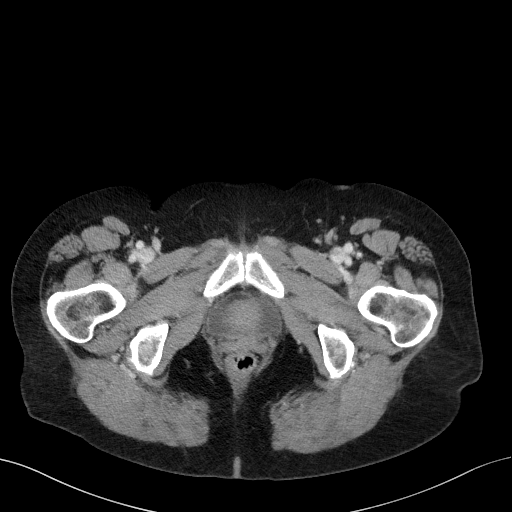
[im 13/124  bone]
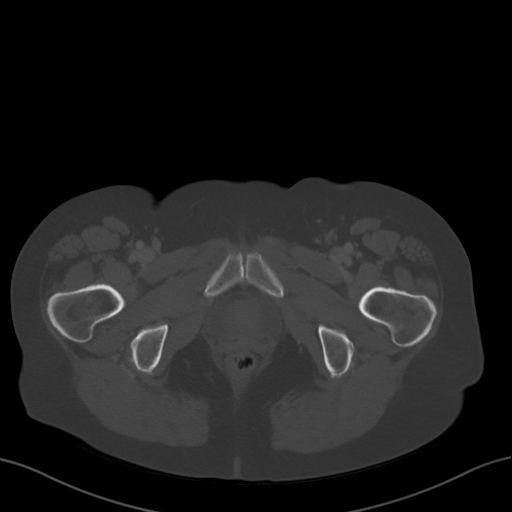
[im 25/124  mediastinal]
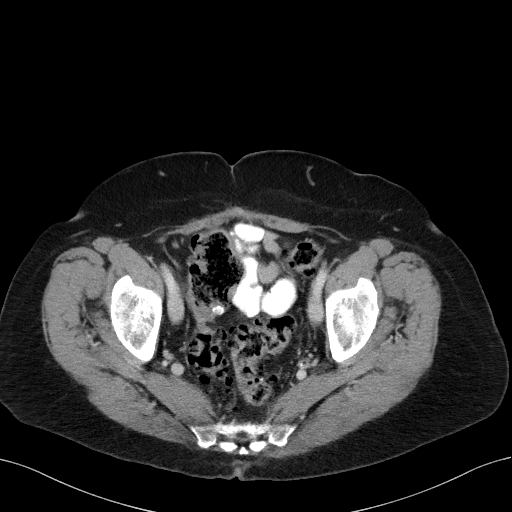
[im 37/124  mediastinal]
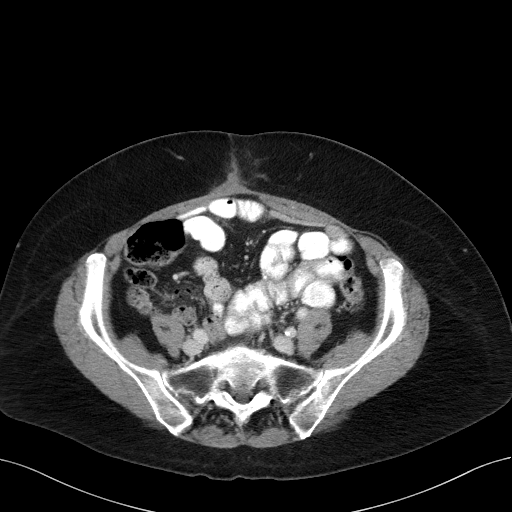
[im 50/124  mediastinal]
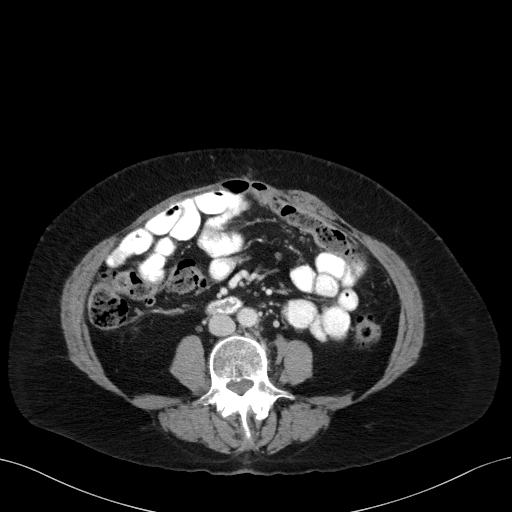
[im 62/124  mediastinal]
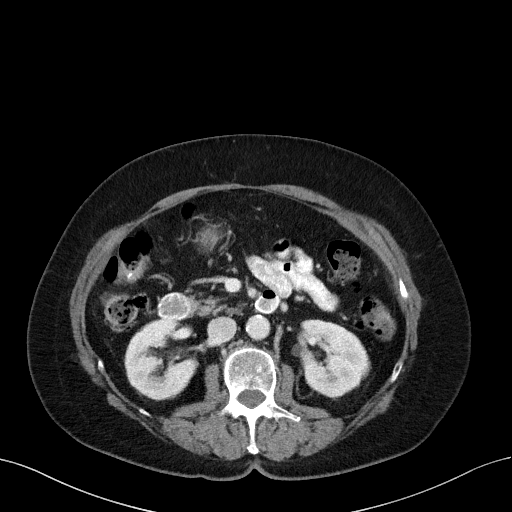
[im 74/124  mediastinal]
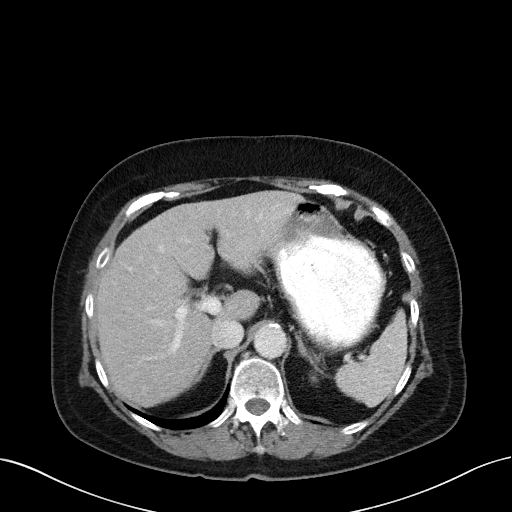
[im 87/124  mediastinal]
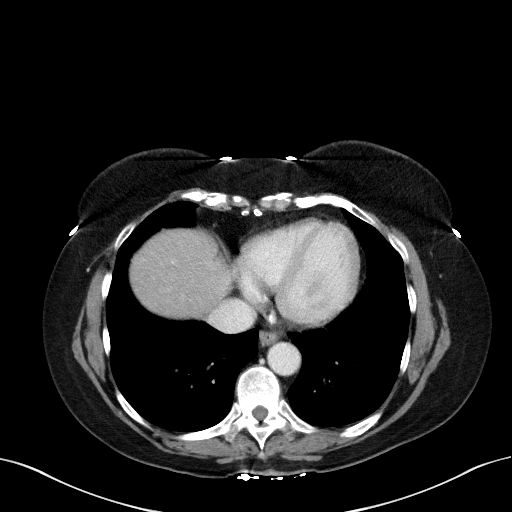
[im 99/124  mediastinal]
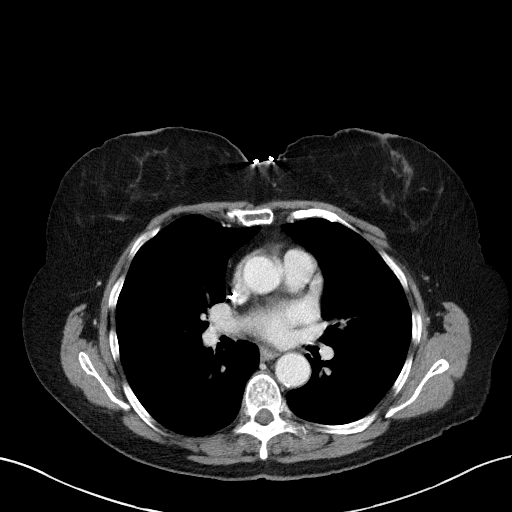
[im 111/124  mediastinal]
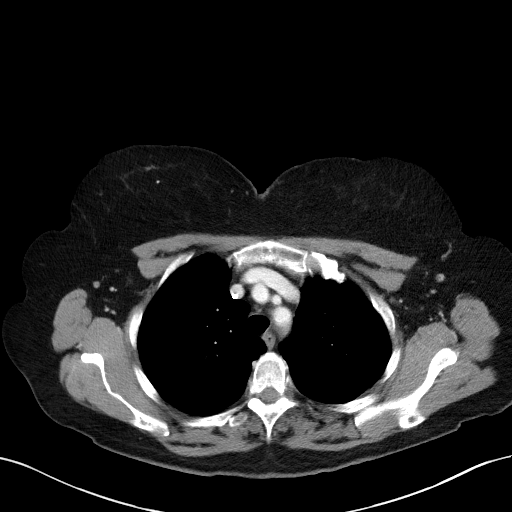
[im 111/124  bone]
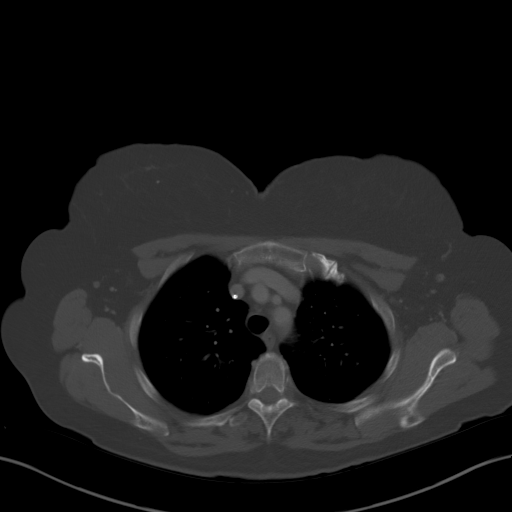

[Series 4: coronals · coronal · 0.95mm/px · 3 of 125 slices shown]
[im 25/125  mediastinal]
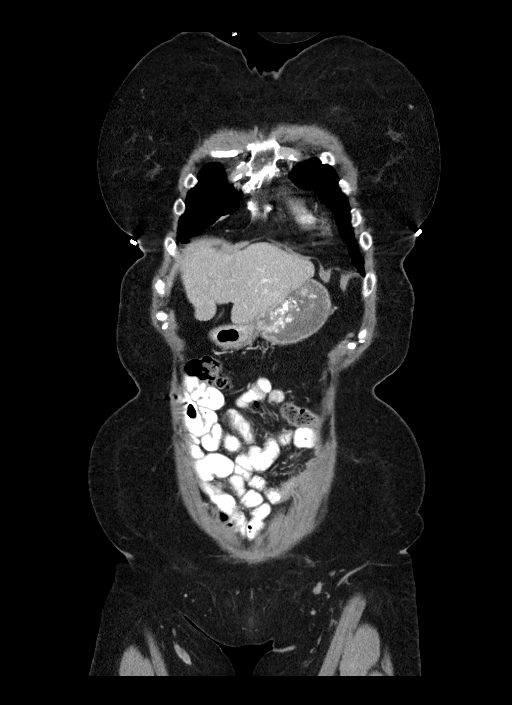
[im 50/125  mediastinal]
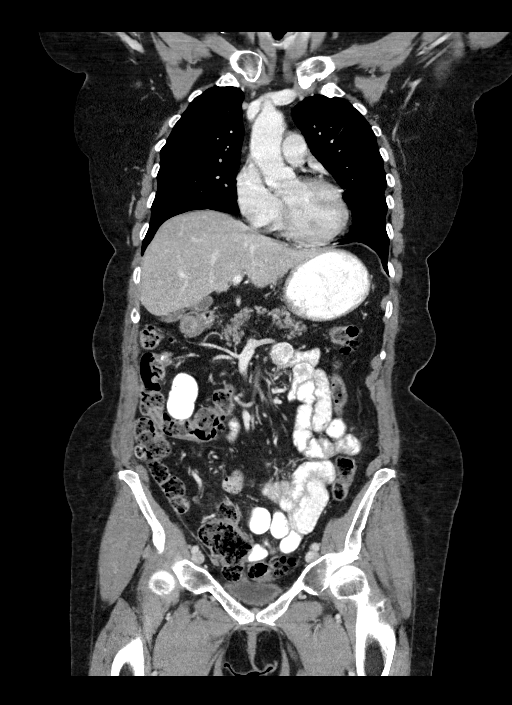
[im 75/125  mediastinal]
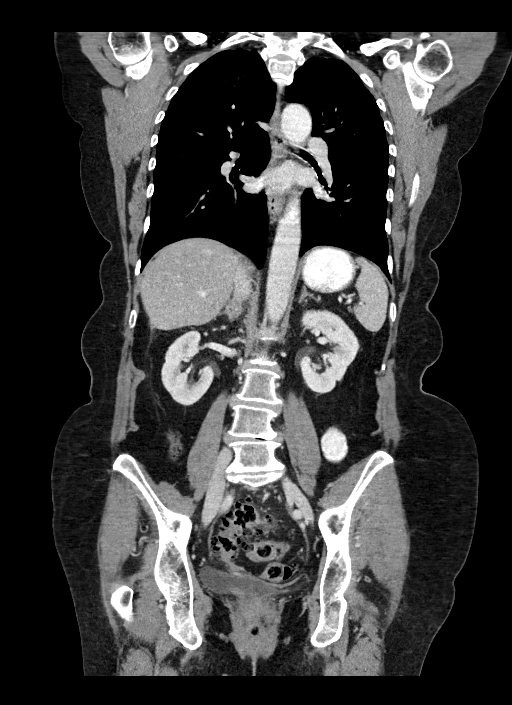

[Series 6: lung · axial · 0.77mm/px · z∈[-145,-97]mm · 2 of 158 slices shown]
[im 13/158  bone]
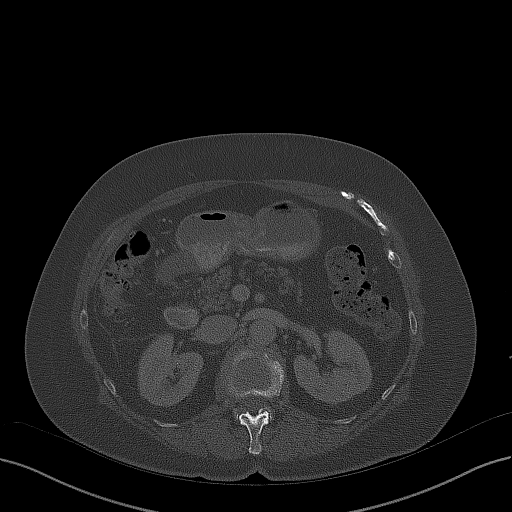
[im 37/158  bone]
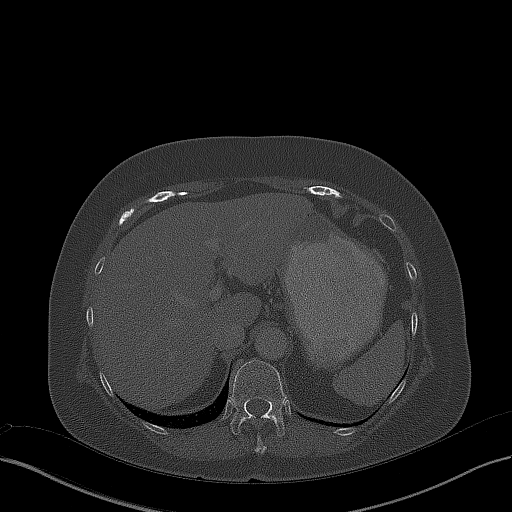

[14 of 36 positions shown; findings below may reference images not displayed]

FINDINGS: CT CHEST FINDINGS

Cardiovascular: The heart size is normal. No substantial pericardial
effusion. Coronary artery calcification is evident. Right
Port-A-Cath tip is positioned in the upper right atrium.

Mediastinum/Nodes: No mediastinal lymphadenopathy. There is no hilar
lymphadenopathy. The esophagus has normal imaging features. There is
no axillary lymphadenopathy.

Lungs/Pleura: Minimal biapical pleuroparenchymal scarring. No
suspicious pulmonary nodule or mass. No focal airspace
consolidation. No pleural effusion.

Musculoskeletal: No worrisome lytic or sclerotic osseous
abnormality.

CT ABDOMEN PELVIS FINDINGS

Hepatobiliary: No suspicious focal abnormality within the liver
parenchyma. There is no evidence for gallstones, gallbladder wall
thickening, or pericholecystic fluid. No intrahepatic or
extrahepatic biliary dilation.

Pancreas: Diffuse parenchymal atrophy without main duct dilatation.

Spleen: No splenomegaly. No focal mass lesion.

Adrenals/Urinary Tract: No adrenal nodule or mass. Right kidney
unremarkable. 19 mm exophytic cyst upper pole left kidney is stable
since 03/07/2020. No evidence for hydroureter. The urinary bladder
appears normal for the degree of distention.

Stomach/Bowel: Stomach is unremarkable. No gastric wall thickening.
No evidence of outlet obstruction. Duodenum is normally positioned
as is the ligament of Treitz. No small bowel wall thickening. No
small bowel dilatation. Diverticuli are seen scattered along the
entire length of the colon without CT findings of diverticulitis.

Vascular/Lymphatic: There is abdominal aortic atherosclerosis
without aneurysm. There is no gastrohepatic or hepatoduodenal
ligament lymphadenopathy. No retroperitoneal or mesenteric
lymphadenopathy. No pelvic sidewall lymphadenopathy.

Reproductive: Uterus surgically absent.  There is no adnexal mass.

Other: Soft tissue thickening along the vaginal cuff appears
slightly decreased in the interval. Scarring in the central pelvis
along the sigmoid colon is similar. Soft tissue component along the
sigmoid colon measuring 3.0 x 1.4 cm today was 2.8 x 1.5 cm
(remeasured) on previous CT. Small fluid collection seen in the
anterior left pelvis adjacent to small bowel on the previous study
has resolved.

Musculoskeletal: Stable appearance of the midline small ventral
hernia without complicating features. No worrisome lytic or
sclerotic osseous abnormality.
IMPRESSION: 1. Stable exam. No substantial change in the scattered areas of soft
tissue thickening in the central pelvis.
2. Interval resolution of the small fluid collection seen in the
anterior left pelvis adjacent to small bowel on the previous study.
3. No evidence for new metastatic disease in the chest, abdomen, or
pelvis.
4. Diffuse colonic diverticulosis without diverticulitis.
5. Aortic Atherosclerosis ([id number]).
